# Patient Record
Sex: Male | Born: 1945 | Race: White | Hispanic: No | Smoking: Never smoker
Health system: Southern US, Community
[De-identification: ages and names within clinical notes are randomized; demographics above are authoritative.]

## PROBLEM LIST (undated history)

## (undated) DIAGNOSIS — R51 Headache: Secondary | ICD-10-CM

## (undated) DIAGNOSIS — M8708 Idiopathic aseptic necrosis of bone, other site: Secondary | ICD-10-CM

## (undated) DIAGNOSIS — C649 Malignant neoplasm of unspecified kidney, except renal pelvis: Secondary | ICD-10-CM

## (undated) DIAGNOSIS — C61 Malignant neoplasm of prostate: Secondary | ICD-10-CM

## (undated) DIAGNOSIS — IMO0002 Reserved for concepts with insufficient information to code with codable children: Secondary | ICD-10-CM

## (undated) DIAGNOSIS — E785 Hyperlipidemia, unspecified: Secondary | ICD-10-CM

## (undated) DIAGNOSIS — I639 Cerebral infarction, unspecified: Secondary | ICD-10-CM

## (undated) DIAGNOSIS — N189 Chronic kidney disease, unspecified: Secondary | ICD-10-CM

## (undated) DIAGNOSIS — M1990 Unspecified osteoarthritis, unspecified site: Secondary | ICD-10-CM

## (undated) DIAGNOSIS — C801 Malignant (primary) neoplasm, unspecified: Secondary | ICD-10-CM

## (undated) DIAGNOSIS — R569 Unspecified convulsions: Secondary | ICD-10-CM

## (undated) DIAGNOSIS — I10 Essential (primary) hypertension: Secondary | ICD-10-CM

## (undated) DIAGNOSIS — M879 Osteonecrosis, unspecified: Secondary | ICD-10-CM

## (undated) HISTORY — DX: Reserved for concepts with insufficient information to code with codable children: IMO0002

## (undated) HISTORY — DX: Malignant neoplasm of prostate: C61

## (undated) HISTORY — PX: NEPHRECTOMY: SHX65

## (undated) HISTORY — PX: JOINT REPLACEMENT: SHX530

---
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
## 2000-11-24 HISTORY — PX: OTHER SURGICAL HISTORY: SHX169

## 2000-11-29 ENCOUNTER — Encounter: Payer: Self-pay | Admitting: Urology

## 2000-12-01 ENCOUNTER — Ambulatory Visit (HOSPITAL_COMMUNITY): Admission: RE | Admit: 2000-12-01 | Discharge: 2000-12-01 | Payer: Self-pay | Admitting: Urology

## 2000-12-01 ENCOUNTER — Encounter (INDEPENDENT_AMBULATORY_CARE_PROVIDER_SITE_OTHER): Payer: Self-pay

## 2001-02-08 ENCOUNTER — Encounter: Payer: Self-pay | Admitting: Neurology

## 2001-02-08 ENCOUNTER — Ambulatory Visit (HOSPITAL_COMMUNITY): Admission: RE | Admit: 2001-02-08 | Discharge: 2001-02-08 | Payer: Self-pay | Admitting: Neurology

## 2006-12-13 ENCOUNTER — Encounter: Admission: RE | Admit: 2006-12-13 | Discharge: 2006-12-13 | Payer: Self-pay | Admitting: Internal Medicine

## 2009-10-24 HISTORY — PX: TOTAL KNEE ARTHROPLASTY: SHX125

## 2010-08-30 ENCOUNTER — Inpatient Hospital Stay (HOSPITAL_COMMUNITY): Admission: RE | Admit: 2010-08-30 | Discharge: 2010-09-02 | Payer: Self-pay | Admitting: Orthopedic Surgery

## 2010-11-14 ENCOUNTER — Encounter: Payer: Self-pay | Admitting: Internal Medicine

## 2010-11-14 ENCOUNTER — Encounter: Payer: Self-pay | Admitting: Neurology

## 2011-01-04 LAB — TYPE AND SCREEN: Antibody Screen: NEGATIVE

## 2011-01-04 LAB — BASIC METABOLIC PANEL
BUN: 12 mg/dL (ref 6–23)
CO2: 29 mEq/L (ref 19–32)
Calcium: 8.5 mg/dL (ref 8.4–10.5)
Calcium: 8.6 mg/dL (ref 8.4–10.5)
Chloride: 97 mEq/L (ref 96–112)
GFR calc Af Amer: >60 mL/min (ref >60)
GFR calc Af Amer: >60 mL/min (ref >60)
GFR calc non Af Amer: 58 mL/min — ABNORMAL LOW (ref >60)
GFR calc non Af Amer: >60 mL/min (ref >60)
Glucose, Bld: 104 mg/dL — ABNORMAL HIGH (ref 70–99)
Glucose, Bld: 133 mg/dL — ABNORMAL HIGH (ref 70–99)
Potassium: 3.9 mEq/L (ref 3.5–5.1)
Potassium: 4.2 mEq/L (ref 3.5–5.1)
Potassium: 4.3 mEq/L (ref 3.5–5.1)
Sodium: 131 mEq/L — ABNORMAL LOW (ref 135–145)
Sodium: 133 mEq/L — ABNORMAL LOW (ref 135–145)
Sodium: 135 mEq/L (ref 135–145)

## 2011-01-04 LAB — CBC
HCT: 22.5 % — ABNORMAL LOW (ref 39.0–52.0)
HCT: 25.7 % — ABNORMAL LOW (ref 39.0–52.0)
HCT: 30.4 % — ABNORMAL LOW (ref 39.0–52.0)
Hemoglobin: 14.4 g/dL (ref 13.0–17.0)
Hemoglobin: 7.9 g/dL — ABNORMAL LOW (ref 13.0–17.0)
Hemoglobin: 9 g/dL — ABNORMAL LOW (ref 13.0–17.0)
MCH: 32.2 pg (ref 26.0–34.0)
MCHC: 35.2 g/dL (ref 30.0–36.0)
RBC: 2.47 MIL/uL — ABNORMAL LOW (ref 4.22–5.81)
RBC: 2.78 MIL/uL — ABNORMAL LOW (ref 4.22–5.81)
RBC: 4.52 MIL/uL (ref 4.22–5.81)
RDW: 13.1 % (ref 11.5–15.5)
WBC: 8.8 10*3/uL (ref 4.0–10.5)
WBC: 9 10*3/uL (ref 4.0–10.5)

## 2011-01-04 LAB — COMPREHENSIVE METABOLIC PANEL
ALT: 18 U/L (ref 0–53)
AST: 26 U/L (ref 0–37)
CO2: 29 mEq/L (ref 19–32)
Chloride: 98 mEq/L (ref 96–112)
GFR calc Af Amer: >60 mL/min (ref >60)
GFR calc non Af Amer: >60 mL/min (ref >60)
Potassium: 5.4 mEq/L — ABNORMAL HIGH (ref 3.5–5.1)
Sodium: 135 mEq/L (ref 135–145)
Total Bilirubin: 1 mg/dL (ref 0.3–1.2)

## 2011-01-04 LAB — ABO/RH: ABO/RH(D): O POS

## 2011-01-04 LAB — URINALYSIS, ROUTINE W REFLEX MICROSCOPIC
Bilirubin Urine: NEGATIVE
Glucose, UA: NEGATIVE mg/dL
Ketones, ur: NEGATIVE mg/dL
pH: 7 (ref 5.0–8.0)

## 2011-01-04 LAB — SURGICAL PCR SCREEN
MRSA, PCR: NEGATIVE
Staphylococcus aureus: POSITIVE — AB

## 2011-03-11 NOTE — Op Note (Signed)
[facility]  Patient:    WANE, MOLLETT                     MRN: [id number] Proc. Date: 12/01/00 Adm. Date:  [id number] Attending:  Londell Moh                           Operative Report  PREOPERATIVE DIAGNOSIS:  Symptomatic left hydrocele.  POSTOPERATIVE DIAGNOSIS:  Symptomatic left hydrocele.  OPERATION:  Left hydrocelectomy.  SURGEON:  Jamison Neighbor, M.D.  ANESTHESIA:  General with postoperative cord block.  COMPLICATIONS:  None.  DRAIN:  1/4-inch Penrose drain.  BRIEF HISTORY:  This 65 year old male has an increasingly large left hydrocele.  The patient has become symptomatic and has requested a left hydrocelectomy.  He understands the risks and benefits of the procedure and gave full and informed consent.  DESCRIPTION OF PROCEDURE:  After the successful induction of general anesthesia, the patient was placed in the supine position, prepped with Betadine, and draped in the usual sterile fashion.  Midline incision was made along the median raphe and then was carried down the multiple vesting layers of the left hemiscrotum.  The hydrocele sac was elevated out of the hemiscrotum, and the layers surrounding the sac were all peeled away.  The sac was then opened and drained.  It was quite large, and the fluid was clear with no signs of infection or hematoma.  The testicle was inspected and was unremarkable.  The epididymis was also intact with no spermatocele or any other abnormalities detected.  The cord was also normal.  The hydrocele sac was quite thick, and a portion of this was removed and sent for specimen.  The hydrocele sac was everted and was then closed on the back side of the testicle with a running locked suture of 3-0 Vicryl.  Hemostasis was obtained with electrocautery.  Testicle was turned back into the left hemiscrotum, and a small puncture wound was made to bring a Penrose drain out from the most appended portion of  the scrotum.  The darkest layer and other layers of the scrotum were then closed in multiple layers with running sutures of 3-0 Vicryl.  The skin was closed with 3-0 chromic.  ______ was applied. The patient was given scrotal support.  The patient tolerated the procedure well and was taken to the recovery room in good condition.  He is to have Lorcet Plus and doxycycline and will return in one week for removal of the drain. DD:  12/01/00 TD:  12/02/00 Job: [id number]

## 2011-09-30 ENCOUNTER — Encounter (HOSPITAL_COMMUNITY): Payer: Self-pay | Admitting: General Practice

## 2011-09-30 ENCOUNTER — Inpatient Hospital Stay (HOSPITAL_COMMUNITY)
Admission: AD | Admit: 2011-09-30 | Discharge: 2011-10-05 | DRG: 066 | Disposition: A | Discharge status: Rehabilitation Facility | Payer: Medicare Other | Source: Ambulatory Visit | Attending: Neurology | Admitting: Neurology

## 2011-09-30 DIAGNOSIS — Z85819 Personal history of malignant neoplasm of unspecified site of lip, oral cavity, and pharynx: Secondary | ICD-10-CM

## 2011-09-30 DIAGNOSIS — I6340 Cerebral infarction due to embolism of unspecified cerebral artery: Principal | ICD-10-CM | POA: Diagnosis present

## 2011-09-30 DIAGNOSIS — I639 Cerebral infarction, unspecified: Secondary | ICD-10-CM | POA: Diagnosis present

## 2011-09-30 DIAGNOSIS — G902 Horner's syndrome: Secondary | ICD-10-CM | POA: Diagnosis present

## 2011-09-30 DIAGNOSIS — G40909 Epilepsy, unspecified, not intractable, without status epilepticus: Secondary | ICD-10-CM | POA: Diagnosis present

## 2011-09-30 DIAGNOSIS — G909 Disorder of the autonomic nervous system, unspecified: Secondary | ICD-10-CM | POA: Diagnosis present

## 2011-09-30 DIAGNOSIS — R112 Nausea with vomiting, unspecified: Secondary | ICD-10-CM | POA: Diagnosis present

## 2011-09-30 DIAGNOSIS — E785 Hyperlipidemia, unspecified: Secondary | ICD-10-CM | POA: Diagnosis present

## 2011-09-30 DIAGNOSIS — H02409 Unspecified ptosis of unspecified eyelid: Secondary | ICD-10-CM | POA: Diagnosis present

## 2011-09-30 DIAGNOSIS — E782 Mixed hyperlipidemia: Secondary | ICD-10-CM | POA: Diagnosis present

## 2011-09-30 DIAGNOSIS — R51 Headache: Secondary | ICD-10-CM | POA: Diagnosis present

## 2011-09-30 DIAGNOSIS — F1020 Alcohol dependence, uncomplicated: Secondary | ICD-10-CM | POA: Diagnosis present

## 2011-09-30 DIAGNOSIS — Z7982 Long term (current) use of aspirin: Secondary | ICD-10-CM

## 2011-09-30 DIAGNOSIS — R519 Headache, unspecified: Secondary | ICD-10-CM | POA: Diagnosis present

## 2011-09-30 DIAGNOSIS — I10 Essential (primary) hypertension: Secondary | ICD-10-CM | POA: Diagnosis present

## 2011-09-30 HISTORY — DX: Cerebral infarction, unspecified: I63.9

## 2011-09-30 HISTORY — DX: Chronic kidney disease, unspecified: N18.9

## 2011-09-30 HISTORY — DX: Malignant (primary) neoplasm, unspecified: C80.1

## 2011-09-30 HISTORY — DX: Unspecified convulsions: R56.9

## 2011-09-30 HISTORY — DX: Malignant neoplasm of unspecified kidney, except renal pelvis: C64.9

## 2011-09-30 HISTORY — DX: Essential (primary) hypertension: I10

## 2011-09-30 HISTORY — DX: Unspecified osteoarthritis, unspecified site: M19.90

## 2011-09-30 LAB — DIFFERENTIAL
Basophils Absolute: 0 10*3/uL (ref 0.0–0.1)
Eosinophils Relative: 4 % (ref 0–5)
Lymphocytes Relative: 17 % (ref 12–46)
Monocytes Absolute: 0.8 10*3/uL (ref 0.1–1.0)
Monocytes Relative: 13 % — ABNORMAL HIGH (ref 3–12)

## 2011-09-30 LAB — CBC
HCT: 41.3 % (ref 39.0–52.0)
MCHC: 36.3 g/dL — ABNORMAL HIGH (ref 30.0–36.0)
MCV: 86 fL (ref 78.0–100.0)
RDW: 12.6 % (ref 11.5–15.5)
WBC: 6.1 10*3/uL (ref 4.0–10.5)

## 2011-09-30 LAB — COMPREHENSIVE METABOLIC PANEL
AST: 18 U/L (ref 0–37)
BUN: 7 mg/dL (ref 6–23)
CO2: 29 mEq/L (ref 19–32)
Calcium: 9.7 mg/dL (ref 8.4–10.5)
Creatinine, Ser: 0.94 mg/dL (ref 0.50–1.35)
GFR calc Af Amer: >90 mL/min (ref >90)
GFR calc non Af Amer: 87 mL/min — ABNORMAL LOW (ref >90)

## 2011-09-30 LAB — TSH: TSH: 0.606 u[IU]/mL (ref 0.350–4.500)

## 2011-09-30 MED ORDER — SENNOSIDES-DOCUSATE SODIUM 8.6-50 MG PO TABS
1.0000 | ORAL_TABLET | Freq: Every evening | ORAL | Status: DC | PRN
  Administered 2011-10-02 – 2011-10-03 (×2): 1 via ORAL
  Filled 2011-09-30 (×2): qty 1

## 2011-09-30 MED ORDER — LAMOTRIGINE 100 MG PO TABS
100.0000 mg | ORAL_TABLET | Freq: Two times a day (BID) | ORAL | Status: DC
  Administered 2011-09-30 – 2011-10-04 (×8): 100 mg via ORAL
  Filled 2011-09-30 (×9): qty 1

## 2011-09-30 MED ORDER — ASPIRIN 325 MG PO TABS
325.0000 mg | ORAL_TABLET | Freq: Every day | ORAL | Status: DC
  Administered 2011-09-30 – 2011-10-05 (×6): 325 mg via ORAL
  Filled 2011-09-30 (×6): qty 1

## 2011-09-30 MED ORDER — ONDANSETRON HCL 4 MG PO TABS: 4.0000 mg | ORAL_TABLET | Freq: Four times a day (QID) | ORAL | Status: DC | PRN

## 2011-09-30 MED ORDER — MECLIZINE HCL 25 MG PO TABS
50.0000 mg | ORAL_TABLET | Freq: Three times a day (TID) | ORAL | Status: DC | PRN
  Administered 2011-09-30: 50 mg via ORAL
  Filled 2011-09-30: qty 2

## 2011-09-30 NOTE — Progress Notes (Signed)
Speech Language/Pathology Clinical/Bedside Swallow Evaluation Patient Details  Name: Connor [name] MRN: [id number] DOB: 09-07-1946 Today's Date: 09/30/2011  Past Medical History: No past medical history on file. Past Surgical History: No past surgical history on file.  Assessment/Recommendations/Treatment Plan    SLP Assessment Clinical Impression Statement: Patient does exhibit s/s of oro-pharyngeal dysphagia, however, he reports this is his baseline since having throat cancer and s/p radiation to the neck.However, while typing this report, the patient was noted to be coughing with the remainder of the graham cracker and water.  Patient would benefit from an objective study to ensure proper diet recommendations. Risk for Aspiration: Moderate Other Related Risk Factors:  (h/o throat cancer, s/p radiation to the neck.)  Recommendations Recommended Consults: MBS Solid Consistency: Dysphagia 3 (Mechanical soft) Liquid Consistency: Nectar Liquid Administration via: Cup Medication Administration: Whole meds with puree Supervision: Staff feed patient Compensations: Slow rate;Small sips/bites Postural Changes and/or Swallow Maneuvers: Seated upright 90 degrees;Out of bed for meals Oral Care Recommendations: Oral care QID Other Recommendations: Prohibited food (jello, ice cream, thin soups)  Prognosis Prognosis for Safe Diet Advancement: Good  Individuals Consulted Consulted and Agree with Results and Recommendations: Patient  Swallow Study Goals     Swallow Study Prior Functional Status     General  Other Pertinent Information: Limited records available.  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  PMH:  squamous cell carcinoma of the neck, treated with radiation and chemo (2-3 years ago  per patinet0.  This resulted in dysphagia and reduced salive.  Patient required a Panda feeding tube "for awhile," but has been tolerating a regular diet with thin liquids PTA. Type of Study: Bedside swallow evaluation Diet Prior to this Study: Regular;Thin liquids Temperature Spikes Noted: No Respiratory Status: Room air History of Intubation: No Behavior/Cognition: Alert;Cooperative;Pleasant mood Oral Cavity - Dentition: Adequate natural dentition Vision: Impaired for self-feeding Patient Positioning: Upright in bed Baseline Vocal Quality: Normal Volitional Cough: Strong Volitional Swallow: Unable to elicit (unable to dry swallow, secondary to reduced saliva productio)  Oral Motor/Sensory Function     Consistency Results     Thin Liquid Thin Liquid: Within functional limits Presentation: Cup;Straw;Self Fed  Nectar Thick Liquid Nectar Thick Liquid: Not tested  Honey Thick Liquid Honey Thick Liquid: Not tested  Puree Puree: Impaired Pharyngeal Phase Impairments: Decreased hyoid-laryngeal movement;Other (comments) Other Comments: Multiple swallows (2) with one bite, then asked for water to "wash it down."  Pt. reports this is his baseline function.  Solid Solid: Impaired Pharyngeal Phase Impairments: Multiple swallows Other Comments: Required sips of water to assist with bolus formation of the cracker.   Connor [name] T 09/30/2011,4:47 PM

## 2011-09-30 NOTE — H&P (Signed)
Reason for admission: Acute stroke History was obtained from patient and Dr. Imagene Gurney office note  CC: Left-sided headache, nausea and vomiting, vertigo, right-sided numbness and gait imbalance  HPI: Connor [name] is an 65 y.o. male bid past medical history of seizure disorder and chronic alcoholism who is a patient of Dr. love since 1994. Patient also has history of throat cancer and underwent radiation therapy and chemotherapy at [facility]. His most recent CT of the neck with contrast on 07/12/2011 showed no evidence of recurrent disease. On wednesday 09/28/2011 he was eating breakfast with his friends in a restaurant and noted the onset of left-sided headache with numbness in the left for head extending down the left side of his face and then involving his entire body with a tingling sensation or numbness, worse on the left side of his body than the right. He became weak in both arms and both legs and slumped in his chair. There was no chest pain or out the patient all her seizure. 911 was called and he was seen at hospital emergency room. CT scan of the brain without contrast was unremarkable and MRI study of the brain with and without contrast which was reviewed by Dr. Lennox Laity was negative for acute stroke on December 5. He was sent home after the evaluation but noted that he was falling on his left side. He also noted double vision and oscillopsia. He did not have hiccups. He also noted difficulty voiding.  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 Patient also has history of seizures.  Past surgical history: Left kidney removed in 1998 due to cancer, left knee joint replacement in 2011. Hydrocele removed in 2002. Father reportedly moved from right orbit and 2007.   Family history: His mother died at age 23 with kidney failure. His father is alive at age 61. His  mother was positive for diabetes.  Social History: He lives at home alone and is divorced at this time. Patient is retired. He has felt good education. He has 2 children. denies ever using tobacco products or illicit drug. Patient drinks about 0-3 beers per day and consumes 2 cups of coffee every day.   Home medications:  Ondansetron 4 mg TBDP Meclizine 25 mg tab by mouth every 8 hours when necessary for dizziness Aspirin 81 mg daily Amlodipine-valsartan Lamotrigine 100 mg tabs one twice daily   Allergies  Allergen Reactions  . Morphine And Related Nausea And Vomiting    Medications: I have reviewed the patient's current medications.  ROS: Over the complete 14 system review the patient complains of only the following symptoms in all other review of systems were negative  Blood pressure 174/96, pulse 72, temperature 97.7 F (36.5 C), temperature source Oral, resp. rate 18, height 5\' 11"  (1.803 m), weight 190 lb (86.183 kg).  Neurologic Examination: Mental status: Alert and oriented x3. Follows 1, 2, 3 step commands. MMSE 29/30. Cranial nerves: Visual fields full. Discs flat. Left Horner syndrome. Jerk nystagmus with eyes to the left. Red lens with left eye hyperphoria. Face Symmetric. Decreased sensation on left corneal and left face and deviation of the uvula to the right. The region of the left. Sternocliedomastoid and trapezius testing normal. Decreased hearing bilaterally with right hearing aid. Motor: 5 out of 5 Sensory: Decreased sensation to pinprick over the right half of the body. Coordination: Slower rapid movements left hand than  right hand. Fair heel-to-shin bilaterally. Outstretched hand and arm tremor. Gait: Falls to the left on standing. Reflexes: Increased deep tendon reflexes on the right as compared to the left.      Assessment/Plan:  Left Wallenberg stroke: CTA head and neck, admitted to telemetry, lipid profile, HbA1c, bedside swallow screen, consult  PT/OT. hold blood pressure medications for first 48 hours.    Lars Mage MD [facility] Internal Medicine Resident Pager [phone]  09/30/2011, 5:09 PM

## 2011-10-01 ENCOUNTER — Encounter (HOSPITAL_COMMUNITY): Payer: Self-pay | Admitting: Radiology

## 2011-10-01 ENCOUNTER — Inpatient Hospital Stay (HOSPITAL_COMMUNITY): Payer: Medicare Other

## 2011-10-01 LAB — HEMOGLOBIN A1C
Hgb A1c MFr Bld: 5.5 % (ref <5.7)
Mean Plasma Glucose: 111 mg/dL (ref <117)

## 2011-10-01 LAB — LIPID PANEL
HDL: 54 mg/dL (ref >39)
Triglycerides: 130 mg/dL (ref <150)

## 2011-10-01 MED ORDER — ACETAMINOPHEN 325 MG PO TABS
650.0000 mg | ORAL_TABLET | Freq: Four times a day (QID) | ORAL | Status: DC | PRN
  Administered 2011-10-01 – 2011-10-03 (×3): 650 mg via ORAL
  Filled 2011-10-01 (×3): qty 2

## 2011-10-01 MED ORDER — IOHEXOL 350 MG/ML SOLN
75.0000 mL | Freq: Once | INTRAVENOUS | Status: AC | PRN
  Administered 2011-10-01: 75 mL via INTRAVENOUS

## 2011-10-01 NOTE — Progress Notes (Signed)
Physical Therapy Evaluation Patient Details Name: TODDRICK SANNA MRN: [id number] DOB: 1946-02-08 Today's Date: 10/01/2011  Problem List:  Patient Active Problem List  Diagnoses  . Stroke, Wallenberg's syndrome  . HTN (hypertension)  . Seizure disorder    Past Medical History:  Past Medical History  Diagnosis Date  . 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 Head and neck cancer ~ 2009    S/P radiation & chem  . Stroke   . Complication of anesthesia   . Spinal headache     "had to have 2 blood " patches S/P nephrectomy  . Hypertension   . Seizures     "the bad kind;bite tongue;see Dr.Love; last 07/2011 & 08/2011"  . Chronic kidney disease   . Cancer     h/o neck  . Kidney carcinoma     h/o  . Arthritis    Past Surgical History:  Past Surgical History  Procedure Date  . Nephrectomy 1990's    left  . Joint replacement   . Total knee arthroplasty 2011    left  . Hydrocelectomy 11/2000    left    PT Assessment/Plan/Recommendation PT Assessment Clinical Impression Statement: Pt presents with spontaneous Rt. beating nystagmus that appears to follow Alexander's Law to the 3rd degree. Pt also appears to have Lt. ptosis however unclear if this is baseline. Currently pt has significant difficulty with gaze stabilization and has been given exercises to try to address this. Pt has significant difficulty with Lt. Lateral lean however responded well to cues for alignement of upright objects in the room during sitting and standing activities. Pt requires RW for transfers at this time. Pt would be a great candidate for CIR.  PT Recommendation/Assessment: Patient will need skilled PT in the acute care venue PT Problem List: Decreased strength;Decreased activity tolerance;Decreased balance;Decreased mobility;Decreased knowledge of use of DME (Vestibular deficits) PT Therapy Diagnosis : Difficulty walking;Generalized weakness PT Plan PT Frequency: Min  4X/week PT Treatment/Interventions: DME instruction;Gait training;Stair training;Functional mobility training;Therapeutic activities;Therapeutic exercise;Balance training;Neuromuscular re-education;Patient/family education PT Recommendation Recommendations for Other Services: Rehab consult Follow Up Recommendations: Inpatient Rehab Equipment Recommended: Defer to next venue PT Goals  Acute Rehab PT Goals PT Goal Formulation: With patient Time For Goal Achievement: 2 weeks Pt will go Supine/Side to Sit: with modified independence PT Goal: Supine/Side to Sit - Progress: Progressing toward goal Pt will Sit at [facility] of Bed: with modified independence;with no upper extremity support;3-5 min (midline with no cues) PT Goal: Sit at Edge Of Bed - Progress: Progressing toward goal Pt will go Sit to Supine/Side: with modified independence PT Goal: Sit to Supine/Side - Progress: Progressing toward goal Pt will go Sit to Stand: with supervision PT Goal: Sit to Stand - Progress: Progressing toward goal Pt will go Stand to Sit: with supervision PT Goal: Stand to Sit - Progress: Progressing toward goal Pt will Transfer Bed to Chair/Chair to Bed: with min assist PT Transfer Goal: Bed to Chair/Chair to Bed - Progress: Progressing toward goal Pt will Ambulate: 1 - 15 feet;with min assist;with rolling walker PT Goal: Ambulate - Progress: Progressing toward goal  PT Evaluation Precautions/Restrictions  Precautions Precautions: Fall Precaution Comments: Lt. 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 Cognition Cognition Arousal/Alertness: Awake/alert Overall Cognitive Status: Appears within functional limits for tasks assessed Orientation Level: Oriented X4 Sensation/Coordination Sensation Additional Comments: Pt reporting difficulty with Hot/Cold determination in UEs and LEs Coordination Gross Motor Movements are Fluid and Coordinated: Yes Fine Motor Movements are Fluid and Coordinated: Yes Heel Shin Test: No Clear deficits Extremity Assessment RLE Assessment RLE Assessment: Within Functional Limits LLE Assessment LLE Assessment:  (WFL with Testing, pt reports functional weakness) Mobility (including Balance) Bed Mobility Bed Mobility: Yes Rolling Right: 5: Supervision Rolling Right Details (indicate cue type and reason): verbal cues for sequencing, utilizing visual targets with mobility  Right Sidelying to Sit: 4: Min assist Right Sidelying to Sit Details (indicate cue type and reason): verbal cues for sequencing, utilizing visual targets with mobility, assist to stabilize in sitting. Verbal/tactile cues to align with PT to reach midline  Sitting - Scoot to Edge of Bed: 4: Min assist Sitting - Scoot to [place] of Bed Details (indicate cue type and reason): PT requiring hand hold assistance and trunk support to scoot to EOB Transfers Transfers: Yes Sit to Stand: 4: Min assist;From bed Sit to Stand Details (indicate cue type and reason): Assistance, verbal/tactile cues to facilitate improved midline posture (decrease Lt. Lean) with standing. Verbal cues to use upright targets to align body with. Stand to Sit: 3: Mod assist;To chair/3-in-1 Stand to Sit Details: Assistance secondary to generalized instability, verbal cues for UE placement.  Stand Pivot Transfers: 1: +2 Total assist (pt= 70%) Stand Pivot Transfer Details (indicate cue type and reason):  Verbal cues for sequencing, using visual targets to align self with with transfer. ?Ataxic movements vs. Effects of gaze instability.  Ambulation/Gait Ambulation/Gait: No (Pt unable at this time)  Posture/Postural Control Posture/Postural Control: Postural limitations Postural Limitations: Significant Lt. Lateral lean Balance Balance Assessed: Yes Static Sitting Balance Static Sitting - Balance Support: Bilateral upper extremity supported;No upper extremity supported;Feet supported Static Sitting - Level of Assistance: 4: Min assist Static Sitting - Comment/# of Minutes: 8-10 min. Pt practicing aligning with PT at midline. Without cuing pt has significant lean to Lt. by end of 8-10 minutes pt able to hold midline without verbal or tactile cues. Verbal cues for increasing weightbearing onto Rt. hip.  Dynamic Sitting Balance Dynamic Sitting - Balance Activities: Lateral lean/weight shifting Dynamic Sitting - Comments: Practiced Leaning to Rt. past midline.  Static Standing Balance Static Standing - Balance Support: Bilateral upper extremity supported;Left upper extremity supported;No upper extremity supported;Right upper extremity supported Static Standing - Level of Assistance: 3: Mod assist (up to mod assist with no UE support) Static Standing - Comment/# of Minutes: ~3 min. Practiced using upright target to align midline with.  Exercise  General Exercises - Lower Extremity Ankle Circles/Pumps: AROM;Both;Seated Other Exercises Other Exercises: Gaze stabilization exercises with letter "A" on wall.  End of Session PT - End of Session Equipment Utilized During Treatment: Gait belt Activity Tolerance: Patient limited by fatigue (limited by dizziness) Patient left: in chair;with call bell in reach Nurse Communication: Mobility status for transfers General Behavior During Session: [facility] for tasks performed Cognition: [facility] for tasks performed  Sherie Don 10/01/2011, 11:36 AM Sherie Don) Carleene Mains PT, DPT Acute Rehabilitation [phone]

## 2011-10-01 NOTE — Procedures (Signed)
Modified Barium Swallow Procedure Note Patient Details  Name: Connor [name] MRN: [id number] Date of Birth: Jan 05, 1946  Today's Date: 10/01/2011 Time:  -     Past Medical History:  Past Medical History  Diagnosis Date  . 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 Head and neck cancer ~ 2009    S/P radiation & chem  . Stroke   . Complication of anesthesia   . Spinal headache     "had to have 2 blood " patches S/P nephrectomy  . Hypertension   . Seizures     "the bad kind;bite tongue;see Dr.Love; last 07/2011 & 08/2011"  . Chronic kidney disease   . Cancer     h/o neck  . Kidney carcinoma     h/o  . Arthritis    Past Surgical History:  Past Surgical History  Procedure Date  . Nephrectomy 1990's    left  . Joint replacement   . Total knee arthroplasty 2011    left  . 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 Pharyngeal dysphagia marked by delay in initiation with all swallows initiated at pyriforms.  Slow bolus transit with all consistencies due to narrowing due to what appeared to be edema or excess tissue . Dr. Charlett Nose present during liquid trials and confirmed no presence of diverticulim  Recommendations Recommended Consults: Consider GI evaluation;Consider esophageal assessment Solid Consistency: Dysphagia 3 (Mechanical soft) Liquid Consistency: Thin Liquid Administration via: Straw;Cup Medication Administration: Crushed with puree Supervision: Intermittent supervision to cue for compensatory strategies Compensations: Slow rate;Small sips/bites;Effortful swallow;Multiple dry swallows after each bite/sip;Follow solids with liquid;Clear throat intermittently Postural Changes and/or Swallow  Maneuvers: Out of bed for meals;Seated upright 90 degrees;Upright 30-60 min after meal Oral Care Recommendations: Oral care before and after PO Other Recommendations: Clarify dietary restrictions Prognosis Prognosis for Safe Diet Advancement: Good Individuals Consulted Consulted and Agree with Results and Recommendations: Patient;RN  SLP Assessment/Plan  Pharyngeal dysphagia secondary to what appeared to be incomplete relaxation of the CP muscle. Severe pooling in pyriforms before swallow with moderate to significant amount of stasis s/p swallow of all consistencies increasing risk for aspiration after the swallow.  Flash penetration occurred x1 with puree consistency. Strategies effective in decreasing  stasis in pyriforms are effortful swallow and swallow x 2 to 3 x.  Patient may benefit from pharyngeal manometry to assess CP function. Recommend ST in acute setting for diet tolerance and to provide education to patient on strategies and aspiration precautions.  SLP Goals   Patient will consume current diet of Dysphagia 3 with thin liquids with no observed clinical s/s of aspiration with minimal verbal cues to utilize recommended strategies.  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 Dysphagia following radiation requiring temporary means of nutrition. Initial BSE completed on 09/30/11 indicated oropharyngeal dysphagia with + s/s of aspiration with thin liquids and solids. MBS recommended to assess risk for aspiration and to recommend safest diet with necessary  strategies. Type of Study: Initial MBS Diet Prior to this Study: Dysphagia 3 (soft);Nectar-thick liquids Temperature Spikes Noted: No Respiratory Status: Room air History of Intubation: No Behavior/Cognition: Alert;Cooperative Oral Cavity - Dentition: Adequate natural dentition Oral Motor / Sensory Function: Within  functional  limits Vision: Impaired for self-feeding Patient Positioning: Upright in chair Baseline Vocal Quality: Normal Volitional Cough: Strong Volitional Swallow: Able to elicit Ice chips: Not tested     Oral Phase Oral Preparation/Oral Phase Oral Phase: Impaired Oral - Nectar Oral - Nectar Cup: Piecemeal swallowing Oral - Thin Oral - Thin Cup: Piecemeal swallowing Oral - Thin Straw: Piecemeal swallowing Oral - Solids Oral - Puree: Within functional limits Oral - Regular: Weak lingual manipulation;Lingual/palatal residue;Delayed oral transit;Piecemeal swallowing Oral - Pill: Within functional limits Oral Phase - Comment Oral Phase - Comment: Oral phase affected secondary to xerostomia decreased bolus cohesion and anterior to posterior propulsion Pharyngeal Phase  Pharyngeal Phase Pharyngeal Phase: Impaired Pharyngeal - Nectar Pharyngeal - Nectar Cup: Delayed swallow initiation;Premature spillage to pyriform;Pharyngeal residue - pyriform;Pharyngeal residue - cp segment;Compensatory strategies attempted (Comment) (swallow x2 cleared majority of stasis in pyriforms) Pharyngeal - Thin Pharyngeal - Thin Cup: Pharyngeal residue - pyriform;Delayed swallow initiation;Premature spillage to pyriform;Pharyngeal residue - cp segment;Compensatory strategies attempted (Comment) (swallow x2  cleared majority of stasis in pyriforms) Pharyngeal - Thin Straw: Delayed swallow initiation;Premature spillage to pyriform;Pharyngeal residue - pyriform;Pharyngeal residue - cp segment Pharyngeal - Solids Pharyngeal - Puree: Pharyngeal residue - cp segment;Pharyngeal residue - pyriform;Compensatory strategies attempted (Comment);Penetration/Aspiration during swallow (effortful swallow x2 cleared majority of stasis in pyriforms) Penetration/Aspiration details (puree): Material enters airway, remains ABOVE vocal cords then ejected out Pharyngeal - Regular: Pharyngeal residue - pyriform;Premature spillage to  pyriform;Delayed swallow initiation;Pharyngeal residue - cp segment Pharyngeal - Pill: Pharyngeal residue - pyriform;Compensatory strategies attempted (Comment) Pharyngeal Phase - Comment Pharyngeal Comment: Pharyngeal dysphagia secondary to incomplete relaxation of CP muscle . All Po trials resulted in significant pooling in pyriforms with stasis s/p swallow increasing risk for aspiration s/p swallow of all consistencies. Cervical Esophageal Phase  Cervical Esophageal Phase Cervical Esophageal Phase: Impaired Cervical Esophageal Phase - Solids Regular: Reduced cricopharyngeal relaxation;Esophageal backflow into cervical esophagus Pill: Reduced cricopharyngeal relaxation;Esophageal backflow into cervical esophagus   Moreen Fowler, M.S., CCC-SLP [phone]  [facility] 10/01/2011, 5:54 PM

## 2011-10-01 NOTE — Progress Notes (Signed)
Subjective: The patient is a 65 year old gentleman with a history of onset of dizziness, gait instability, double vision, and nausea that began on 09/28/2011. The patient has noted a tendency to veer to the right while walking. MRI of the brain did not show evidence of an acute stroke, but the patient has a suspected Wallenberg-type stroke. The patient was admitted for further evaluation. CT angiogram of the head and neck was done, but the formal report is not available to me. By my review, there is no evidence of a large vessel occlusion. Patient was on aspirin prior to the onset of the deficit. At this time, the patient is not effectively ambulatory. Physical therapy to start working with him, that he has not been able to ambulate. Speech therapy is seen, and the patient now is on an oral diet, with nectar thick liquids. No new issues have been noted since admission. Patient denies any actual vomiting.  Objective: Vital signs in last 24 hours: Temp:  [97.7 F (36.5 C)-98.8 F (37.1 C)] 97.7 F (36.5 C) (12/08 1010) Pulse Rate:  [72-95] 84  (12/08 1010) Resp:  [17-18] 18  (12/08 1010) BP: (133-174)/(80-96) 133/93 mmHg (12/08 1010) SpO2:  [95 %-98 %] 98 % (12/08 1010) Weight:  [86.183 kg (190 lb)] 190 lb (86.183 kg) (12/07 1623) Weight change:  Last BM Date: 09/27/11  Intake/Output from previous day: 12/07 0701 - 12/08 0700 In: 360 [P.O.:360] Out: 1250 [Urine:1250] Intake/Output this shift: Total I/O In: -  Out: 450 [Urine:450]  @ROS @ The patient reports a slight headache on the left posterior and temporal region. The patient does note some nausea, no vomiting. The patient reports double vision and ocillopsia. The patient does report some numbness on the left face, not on the body. Patient denies any weakness. The patient denies shortness of breath, and denies chest pain. The patient denies abdominal pain.   Physical Examination:  Mental Status Exam: The patient is alert and  cooperative at the time of the examination. The patient is oriented x3.  Motor Exam: The patient moves all 4 extremities, and has good strength bilaterally. No drift is seen.  Cerebellar Exam: The patient has good finger-nose-finger and heel-to-shin bilaterally. Gait was not tested.  Deep Tendon reflexes: Deep tendon reflexes are symmetric throughout. Toes are downgoing bilaterally.  Cranial Nerve Exam: Facial symmetry is present. Extraocular movements are full. Significant status is seen on primary gaze, with the fast phase to the right. Visual fields are full. Speech is well enunciated, no aphasia or dysarthria is noted.  Breast her examination is clear bilaterally  Cardiovascular semination reveals a regular rate and rhythm, no obvious murmurs or rubs are noted.  Abdomen reveals positive bowel sounds, no tenderness is noted.  Lab Results:  [facility] 09/30/11 1752  WBC 6.1  HGB 15.0  HCT 41.3  PLT 249   BMET  Basename 09/30/11 1752  NA 132*  K 4.3  CL 95*  CO2 29  GLUCOSE 116*  BUN 7  CREATININE 0.94  CALCIUM 9.7    Studies/Results: Dg Chest 2 View  10/01/2011  *RADIOLOGY REPORT*  Clinical Data: Stroke  CHEST - 2 VIEW  Comparison: 08/25/2010  Findings: The heart size and pulmonary vascularity are normal. The lungs appear clear and expanded without focal air space disease or consolidation. No blunting of the costophrenic angles.  No significant change since previous study.  IMPRESSION: No evidence of active pulmonary disease.  Original Report Authenticated By: Marlon Pel, M.D.    Medications:  Scheduled:   . aspirin  325 mg Oral Daily  . lamoTRIgine  100 mg Oral BID   Continuous:  [id number], meclizine, ondansetron, senna-docusate  Assessment/Plan:  1. 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 The patient likely will need inpatient rehabilitation. Patient was on aspirin at the time of the stroke.  The patient is now on a dysphasia diet with nectar thick liquids. Patient is not having vomiting, and is able to eat. The CT angiogram has been done, but the formal report is pending. I will obtain a rehabilitation consult. Physical, occupational, and speech therapy are working with the patient.    LOS: 1 day   WILLIS,CHARLES KEITH 10/01/2011, 10:45 AM

## 2011-10-02 NOTE — Progress Notes (Signed)
Subjective: Patient is alert and cooperative. The patient reports some left-sided headache, and still notes ocillopsia. The visual complaints have resolved and some underlying nausea and discomfort. Patient is now eating and drinking fairly well. Overall, the patient feels slightly better than he did yesterday.  Objective: Vital signs in last 24 hours: Temp:  [98 F (36.7 C)-98.6 F (37 C)] 98 F (36.7 C) (12/09 0958) Pulse Rate:  [70-90] 90  (12/09 0958) Resp:  [16-18] 16  (12/09 0958) BP: (131-169)/(81-90) 131/81 mmHg (12/09 0958) SpO2:  [98 %-100 %] 98 % (12/09 0958) Weight change:  Last BM Date: 09/27/11  Intake/Output from previous day: 12/08 0701 - 12/09 0700 In: -  Out: 1225 [Urine:1225] Intake/Output this shift:    @ROS @  The patient reports a left-sided headache. The patient reports dizziness.  The patient indicates that his eyes tend to want to go to the left. The patient reports oscillopsia.  The patient denies short of breath, chest pain.  The patient denies nausea or vomiting, or abdominal pain.  The patient has not had any new numbness or weakness of the face, arms, or legs.  The patient reports some double vision.     Physical Examination:  Mental Status Exam: The patient is alert and cooperative at the time of the examination. The patient is oriented x3.  Motor Exam: The patient moves all 4 extremities, and has good strength bilaterally.  Cerebellar Exam: The patient has good finger-nose-finger and heel-to-shin bilaterally. Gait was not tested.  Deep tendon reflexes: Deep tendon reflexes are symmetric throughout. Toes are downgoing bilaterally.  Cranial Nerve Exam: Facial symmetry is present. Extraocular movements are full. Nystagmus is seen with lateral gaze bilaterally, fast phase to the right.  Visual fields are full. Speech is well enunciated, no aphasia or dysarthria is noted.  Respiratory examination is clear.  Cardiovascular examination  reveals a regular rate and rhythm, no obvious murmurs or rubs are noted.  Extremities are without significant edema.  Abdominal examination reveals positive bowel sounds, no tenderness noted.   Lab Results:  [facility] 09/30/11 1752  WBC 6.1  HGB 15.0  HCT 41.3  PLT 249   BMET  Basename 09/30/11 1752  NA 132*  K 4.3  CL 95*  CO2 29  GLUCOSE 116*  BUN 7  CREATININE 0.94  CALCIUM 9.7    Studies/Results: Ct Angio Head W/cm &/or Wo Cm  10/01/2011  *RADIOLOGY REPORT*  Clinical Data:  Left sided headache and blurred vision.  Off balance.  Evaluate posterior circulation for stroke.  CT ANGIOGRAPHY HEAD  Technique:  Multidetector CT imaging of the head was performed using the standard protocol during bolus administration of intravenous contrast.  Multiplanar CT image reconstructions including MIPs were obtained to evaluate the vascular anatomy.  Contrast: 75mL OMNIPAQUE IOHEXOL 350 MG/ML IV SOLN  Comparison:  None available.  Findings:  The noncontrast images of the head demonstrate no acute infarct, hemorrhage, mass lesion.  The source images reveal no acute infarct. The ventricles are of normal size.  No significant extra-axial fluid collection is present.  The more delayed postcontrast images demonstrate no areas of pathologic enhancement.  Minimal mucosal thickening is present within the maxillary sinuses bilaterally.  The remaining paranasal sinuses and mastoid air cells are clear.  The internal carotid arteries demonstrate atherosclerotic calcifications within the cavernous segments bilaterally.  There is no significant stenosis relative to the distal vessels.  The A1 and M1 segments are normal.  The anterior communicating artery is patent.  An  azygos A2 segment is noted.  The MCA bifurcations are normal bilaterally.  Mild segmental irregularity is noted within distal MCA branch vessels.  The ACA branch vessels are unremarkable.  The left vertebral artery is the dominant vessel.  The right  vertebral artery terminates at the PICA.  No definite left PICA is visualized.  A prominent left AICA is noted.  The basilar artery is normal.  The right posterior cerebral artery originates from basilar tip.  The left posterior cerebral artery is of fetal type. PCA branch vessels are normal bilaterally.   Review of the MIP images confirms the above findings.  IMPRESSION:  1.  Normal variant MRA circle of Jakaleb Payer without significant proximal stenosis, aneurysm, or branch vessel occlusion. 2.  Minimal small vessel disease. 3.  No evidence for posterior circulation stenosis, occlusion, or infarct.  Original Report Authenticated By: Jamesetta Orleans. MATTERN, M.D.   Dg Chest 2 View  10/01/2011  *RADIOLOGY REPORT*  Clinical Data: Stroke  CHEST - 2 VIEW  Comparison: 08/25/2010  Findings: The heart size and pulmonary vascularity are normal. The lungs appear clear and expanded without focal air space disease or consolidation. No blunting of the costophrenic angles.  No significant change since previous study.  IMPRESSION: No evidence of active pulmonary disease.  Original Report Authenticated By: Marlon Pel, M.D.   Dg Swallowing Func-no Report  10/01/2011  CLINICAL DATA: dysphagia   FLUOROSCOPY FOR SWALLOWING FUNCTION STUDY:  Fluoroscopy was provided for swallowing function study, which was  administered by a speech pathologist.  Final results and recommendations  from this study are contained within the speech pathology report.      Medications:  Scheduled:   . aspirin  325 mg Oral Daily  . lamoTRIgine  100 mg Oral BID   Continuous:  [id number]:[id number], meclizine, ondansetron, senna-docusate  Assessment/Plan: 1. 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 The patient likely will need inpatient rehabilitation. Patient was on aspirin at the time of the stroke. The patient is now on a dysphasia diet with nectar thick liquids. Patient is not  having vomiting, and is able to eat. The CT angiogram has been done, but the formal report is normal. I will obtain a rehabilitation consult. Physical, occupational, and speech therapy are working with the patient.       LOS: 2 days   Stephane Junkins KEITH 10/02/2011, 11:28 AM

## 2011-10-03 ENCOUNTER — Inpatient Hospital Stay (HOSPITAL_COMMUNITY): Payer: Medicare Other

## 2011-10-03 DIAGNOSIS — I6330 Cerebral infarction due to thrombosis of unspecified cerebral artery: Secondary | ICD-10-CM

## 2011-10-03 MED ORDER — SODIUM CHLORIDE 0.9 % IV SOLN
Freq: Once | INTRAVENOUS | Status: AC
  Administered 2011-10-03: 22:00:00 via INTRAVENOUS

## 2011-10-03 MED ORDER — SODIUM CHLORIDE 0.9 % IV SOLN
1500.0000 mg | Freq: Once | INTRAVENOUS | Status: AC
  Administered 2011-10-03: 1500 mg via INTRAVENOUS
  Filled 2011-10-03: qty 30

## 2011-10-03 MED ORDER — LORAZEPAM 2 MG/ML IJ SOLN: 2.0000 mg | Freq: Once | INTRAMUSCULAR | Status: DC

## 2011-10-03 MED ORDER — LORAZEPAM 2 MG/ML IJ SOLN
INTRAMUSCULAR | Status: AC
  Filled 2011-10-03: qty 1

## 2011-10-03 NOTE — Progress Notes (Signed)
*  PRELIMINARY RESULTS*  Carotid Doppler has been performed. Bilateral:  No evidence of hemodynamically significant internal carotid artery stenosis.  Vertebral artery flow is antegrade.      Farrel Demark 10/03/2011, 3:44 PM

## 2011-10-03 NOTE — Consult Note (Signed)
Physical Medicine and Rehabilitation Consult Reason for Consult:Stroke Referring Phsyician: DR.Willis Connor [name] is an 65 y.o. male.   HPI: 65 year old right-handed male with past medical history seizure disorder and chronic alcoholism as well as throat cancer with radiation therapy. Patient patient was noted left-sided weakness on December 5 while eating breakfast with friends. CT scan of the head as well as MRI of the brain with and without contrast were negative and patient was discharged home from the emergency department. He was admitted to the hospital on December 7 with falls as well as increased left-sided weakness as well as double vision. 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 CT angiogram of the head showed no evidence of posterior circulation stenosis or occlusion. Maintained on aspirin therapy with full neurology workup ongoing. Physical therapy evaluation on December 8 the patient was moderate assistance to go from stand to sit minimal assist to go from sit to stand. Ambulation was total assistance of 2 with ongoing cues.MBS per SLP and placed on dys 3 nectar liquids. Physical medicine and rehabilitation was consulted at the request of physical therapy to consider inpatient rehabilitation services.  Review of Systems  Constitutional: Positive for malaise/fatigue. Negative for fever.  Eyes: Positive for blurred vision.  Respiratory: Negative for cough and shortness of breath.   Cardiovascular: Negative for chest pain.  Gastrointestinal: Negative for nausea.  Genitourinary: Negative for dysuria.  Musculoskeletal: Positive for falls. Negative for myalgias.  Skin: Negative.   Neurological: Positive for dizziness, seizures and headaches.  Psychiatric/Behavioral: Negative.    Past Medical History  Diagnosis Date  . 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 Head and neck cancer ~ 2009    S/P radiation & chem  . Stroke   . Complication  of anesthesia   . Spinal headache     "had to have 2 blood " patches S/P nephrectomy  . Hypertension   . Seizures     "the bad kind;bite tongue;see Dr.Love; last 07/2011 & 08/2011"  . Chronic kidney disease   . Cancer     h/o neck  . Kidney carcinoma     h/o  . Arthritis    Past Surgical History  Procedure Date  . Nephrectomy 1990's    left  . Joint replacement   . Total knee arthroplasty 2011    left  . Hydrocelectomy 11/2000    left   History reviewed. No pertinent family history. Social History:  reports that he has never smoked. He has never used smokeless tobacco. He reports that he drinks about 12 ounces of alcohol per week. He reports that he does not use illicit drugs. Allergies:  Allergies  Allergen Reactions  . Morphine And Related Nausea And Vomiting   Medications Prior to Admission  Medication Dose Route Frequency Provider Last Rate Last Dose  . acetaminophen (TYLENOL) tablet 650 mg  650 mg Oral Q6H PRN Evie Lacks, MD   650 mg at 10/01/11 1835  . aspirin tablet 325 mg  325 mg Oral Daily Ankit Garg   325 mg at 10/02/11 0939  . iohexol (OMNIPAQUE) 350 MG/ML injection 75 mL  75 mL Intravenous Once PRN Medication Radiologist   75 mL at 10/01/11 0115  . lamoTRIgine (LAMICTAL) tablet 100 mg  100 mg Oral BID Ankit Garg   100 mg at 10/02/11 2135  . meclizine (ANTIVERT) tablet 50 mg  50 mg Oral Q8H PRN Ankit Garg   50 mg at 09/30/11 2133  .  ondansetron (ZOFRAN) tablet 4 mg  4 mg Oral Q6H PRN Ankit Garg      . senna-docusate (Senokot-S) tablet 1 tablet  1 tablet Oral QHS PRN Ankit Garg   1 tablet at 10/02/11 2135   No current outpatient prescriptions on file as of 10/03/2011.    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 Verbal/tactile cues to align with PT to reach midline  Sitting - Scoot to Edge of Bed: 4: Min assist Sitting - Scoot to [place] of Bed Details (indicate cue type and reason): PT requiring hand hold assistance and trunk support to scoot to EOB Transfers Transfers: Yes Sit to Stand: 4: Min assist;From bed Sit to Stand Details (indicate cue type and reason): Assistance, verbal/tactile cues to facilitate improved midline posture (decrease Lt. Lean) with standing. Verbal cues to use upright targets to align body with. Stand to Sit: 3: Mod assist;To chair/3-in-1 Stand to Sit Details: Assistance secondary to generalized instability, verbal cues for UE placement.  Stand Pivot Transfers: 1: +2 Total assist (pt= 70%) Stand Pivot Transfer Details (indicate cue type and reason): Verbal cues for sequencing, using visual targets to align self with with transfer.  Ambulation/Gait Ambulation/Gait: No (Pt unable at this time)    ADL:    Cognition: Cognition Arousal/Alertness: Awake/alert Orientation Level: Oriented X4 Cognition Arousal/Alertness: Awake/alert Overall Cognitive Status: Appears within functional limits for tasks assessed Orientation Level: Oriented X4  Blood pressure 137/78, pulse 81, temperature 97.9 F (36.6 C), temperature  source Oral, resp. rate 18, height 5\' 11"  (1.803 m), weight 86.183 kg (190 lb), SpO2 96.00%. Physical Exam  Constitutional: He is oriented to person, place, and time. He appears well-developed.  HENT:  Head: Normocephalic.  Neck: Normal range of motion. Neck supple. No thyromegaly present.  Cardiovascular: Normal rate and regular rhythm.   Pulmonary/Chest: Effort normal. No respiratory distress.  Abdominal: He exhibits no distension. There is no guarding.  Musculoskeletal: He exhibits no edema.  Neurological: He is alert and oriented to person, place, and time. He has normal reflexes. A cranial nerve deficit and sensory deficit is present.       Patient with left ptosis and mild left-sided facial sensory loss. He had left-sided limb ataxia more than right double vision impacted fine motor coordination of both upper extremities. Patient's vision was grossly impaired only although he was able to see gross objects. He did have double vision although no gross got disconjugate gaze is seen. No nystagmus was appreciated. Patient did not experience vertigo. He had mild right arm and leg sensory loss to pain and temperature. Strength is grossly 4-4+ out of 5 bilaterally. Cognitively she is intact.  Skin: Skin is warm and dry.  Psychiatric: He has a normal mood and  affect.    No results found for this or any previous visit (from the past 24 hour(s)). Dg Swallowing Func-no Report  10/01/2011  CLINICAL DATA: dysphagia   FLUOROSCOPY FOR SWALLOWING FUNCTION STUDY:  Fluoroscopy was provided for swallowing function study, which was  administered by a speech pathologist.  Final results and recommendations  from this study are contained within the speech pathology report.      Assessment/Plan: Diagnosis: Left Wallenberg syndrome 1. Does the need for close, 24 hr/day medical supervision in concert with the patient's rehab needs make it unreasonable for this patient to be served in a less intensive setting?  Yes 2. Co-Morbidities requiring supervision/potential complications: Hypertension, seizure disorder, chronic kidney disease 3. Due to bladder management, bowel management, safety, skin/wound care, disease management and patient education, does the patient require 24 hr/day rehab nursing? Yes 4. Does the patient require coordinated care of a physician, rehab nurse, PT (1-2 hrs/day, 5 days/week), OT (1-2 hrs/day, 5 days/week) and SLP (1-2 hrs/day, 5 days/week) to address physical and functional deficits in the context of the above medical diagnosis(es)? Yes Addressing deficits in the following areas: balance, bathing, bowel/bladder control, dressing, endurance, psychosocial adjustment, speech, strength, swallowing, toileting and transferring 5. Can the patient actively participate in an intensive therapy program of at least 3 hrs of therapy per day at least 5 days per week? Yes 6. The potential for patient to make measurable gains while on inpatient rehab is excellent 7. Anticipated functional outcomes upon discharge from inpatients are modified independent to PT, modified independent to supervision OT, modified independent SLP 8. Estimated rehab length of stay to reach the above functional goals is: 2 weeks 9. Does the patient have adequate social supports to accommodate these discharge functional goals? Yes 10. Anticipated D/C setting: Home 11. Anticipated post D/C treatments: Outpt therapy 12. Overall Rehab/Functional Prognosis: excellent  RECOMMENDATIONS: This patient's condition is appropriate for continued rehabilitative care in the following setting: CIR Patient has agreed to participate in recommended program. Yes Note that insurance prior authorization may be required for reimbursement for recommended care.  Comment: Rehabilitation nurse to followup.   ANGIULLI,DANIEL J. 10/03/2011

## 2011-10-03 NOTE — Consults (Signed)
Called for 4 min. GTC seizure by the patient. When I came to bedside, patient appeared to be post-ictal and was only able to tell me his name. Otherwise, he mumbled and had difficulty collecting his thoughts. He had a left gaze deviation. PERRL. Withdrew right side less to pain than the left side.  I spoke with Dr. Pearlean Brownie who told me that patient's exam appeared to be at baseline.   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 Not t-PA candidate. Had a CT angiography of the head today with no evidence of any large vessel occlusion. Will load with Dilantin 1500 mg. Dilantin level in am. Dr. Pearlean Brownie will try to obtain an MRI with thin cuts through brainstem. CT head has been ordered. Spoke with family at bedside.   Carmell Austria, MD 10/03/2011 7:36 pm

## 2011-10-03 NOTE — Progress Notes (Signed)
Speech Language Pathology  Attempted evaluation.  Pt currently unavailable.  Will continue efforts. Lilas Diefendorf B. Imogene Gravelle, MSP, CCC-SLP [phone]

## 2011-10-03 NOTE — Progress Notes (Signed)
Physical Therapy Treatment Patient Details Name: KALMEN LOLLAR MRN: [id number] DOB: 08/16/1946 Today's Date: 10/03/2011  PT Assessment/Plan  PT - Assessment/Plan Comments on Treatment Session: Pt showing good improvement with mobility and pregression of gait today.  However, LLE spasticity and visual disturbance continues to significantly limit mobility and safety.  Continue to recommend CIR at d/c as option with greatest potential for pt. PT Plan: Discharge plan remains appropriate PT Frequency: Min 4X/week Follow Up Recommendations: Inpatient Rehab Equipment Recommended: Defer to next venue PT Goals  Acute Rehab PT Goals PT Goal Formulation: With patient Time For Goal Achievement: 2 weeks Pt will go Supine/Side to Sit: with modified independence PT Goal: Supine/Side to Sit - Progress: Progressing toward goal Pt will Sit at [facility] of Bed: with modified independence;with no upper extremity support;3-5 min PT Goal: Sit at Edge Of Bed - Progress: Met Pt will go Sit to Supine/Side: with modified independence PT Goal: Sit to Supine/Side - Progress: Progressing toward goal Pt will go Sit to Stand: with supervision PT Goal: Sit to Stand - Progress: Progressing toward goal Pt will go Stand to Sit: with supervision PT Goal: Stand to Sit - Progress: Progressing toward goal Pt will Transfer Bed to Chair/Chair to Bed: with min assist PT Transfer Goal: Bed to Chair/Chair to Bed - Progress: Progressing toward goal Pt will Ambulate: 51 - 150 feet;with min assist;with rolling walker PT Goal: Ambulate - Progress: Progressing toward goal  PT Treatment Precautions/Restrictions  Precautions Precautions: Fall Precaution Comments: Lt lateral lean Required Braces or Orthoses: No Restrictions Weight Bearing Restrictions: No Mobility (including Balance) Bed Mobility Bed Mobility: Yes Rolling Right: 6: Modified independent (Device/Increase time) Right Sidelying to Sit: HOB flat;5:  Supervision Right Sidelying to Sit Details (indicate cue type and reason): vc's given for pt to attend to sitting posture with initial sitting up.  Pt able to self-correct left lean with these vc's Sitting - Scoot to Edge of Bed: 5: Supervision Transfers Transfers: Yes Sit to Stand: 4: Min assist;From bed;From chair/3-in-1;With upper extremity assist Sit to Stand Details (indicate cue type and reason): vc's for hand placement.  Min A given for safety to left side due to left lean.  vc's given to maintain standing initially to verify balance before stepping Stand to Sit: 4: Min assist;With upper extremity assist;To chair/3-in-1 (min-guard A) Stand to Sit Details: vc's for hand placement and to attend to left side with sit Ambulation/Gait Ambulation/Gait: Yes Ambulation/Gait Assistance: 3: Mod assist;Other (comment) (+1 for equipment) Ambulation/Gait Assistance Details (indicate cue type and reason): pt needed only min A first 10 ft but began to fatigue very quickly and experienced spasticity in the LLE that led to a steppage gait pattern, increased left lean and the need for mod A.  Gait pattern continued to deteriorate with distance/ fatigue Ambulation Distance (Feet): 35 Feet Assistive device: Rolling walker Gait Pattern: Left steppage;Left genu recurvatum;Step-through pattern Stairs: No Wheelchair Mobility Wheelchair Mobility: No  Posture/Postural Control Posture/Postural Control: Postural limitations Postural Limitations: left lateral lean, not as extreme as on initial eval Balance Balance Assessed: Yes Static Standing Balance Static Standing - Balance Support: Bilateral upper extremity supported;During functional activity Static Standing - Level of Assistance: 4: Min assist Static Standing - Comment/# of Minutes: 10 Dynamic Standing Balance Dynamic Standing - Level of Assistance: 3: Mod assist Exercise  General Exercises - Lower Extremity Long Arc Quad: AROM;Strengthening;Left;10  reps;Seated;Limitations (focusing on smooth control, +/- resistance) Long Arc Quad Limitations: pt has difficulty controlling mvmt due to spasticity, improves  when resistance given Mini-Sqauts: Both;AROM;Strengthening;5 reps;Limitations;Standing Mini Squats Limitations: focusing on slow, smooth mvmt.  Controlling mvmt difficult for pt Other Exercises Other Exercises: encourage gaze stabiliation with mobility and sitting/standing balance End of Session PT - End of Session Equipment Utilized During Treatment: Gait belt Activity Tolerance: Patient tolerated treatment well Patient left: in chair;with call bell in reach;with family/visitor present General Behavior During Session: [facility] for tasks performed Cognition: [facility] for tasks performed  Khari Mally, [place]  [phone] 10/03/2011, 4:07 PM

## 2011-10-03 NOTE — Progress Notes (Signed)
Occupational Therapy Evaluation Patient Details Name: Connor [name] MRN: [id number] DOB: Nov 27, 1945 Today's Date: 10/03/2011  Problem List:  Patient Active Problem List  Diagnoses  . Stroke, Wallenberg's syndrome  . HTN (hypertension)  . Seizure disorder    Past Medical History:  Past Medical History  Diagnosis Date  . 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 Head and neck cancer ~ 2009    S/P radiation & chem  . Stroke   . Complication of anesthesia   . Spinal headache     "had to have 2 blood " patches S/P nephrectomy  . Hypertension   . Seizures     "the bad kind;bite tongue;see Dr.Love; last 07/2011 & 08/2011"  . Chronic kidney disease   . Cancer     h/o neck  . Kidney carcinoma     h/o  . Arthritis    Past Surgical History:  Past Surgical History  Procedure Date  . Nephrectomy 1990's    left  . Joint replacement   . Total knee arthroplasty 2011    left  . Hydrocelectomy 11/2000    left    OT Assessment/Plan/Recommendation OT Assessment Clinical Impression Statement: Patient admitted with stroke like symptoms consistent with Wallenburg Syndrome. Will benefit from skilled OT in acute setting to maximize independence with ADL and ADL mobility to facilitate d/c to next venue. OT Recommendation/Assessment: Patient will need skilled OT in the acute care venue OT Problem List: Impaired balance (sitting and/or standing);Impaired vision/perception;Decreased coordination;Decreased knowledge of use of DME or AE;Decreased knowledge of precautions;Impaired sensation OT Therapy Diagnosis : Paresis;Disturbance of vision OT Plan OT Frequency: Min 2X/week OT Treatment/Interventions: Self-care/ADL training;Neuromuscular education;DME and/or AE instruction;Therapeutic activities;Cognitive remediation/compensation;Visual/perceptual remediation/compensation;Patient/family education;Balance training OT Recommendation Recommendations for Other  Services: Rehab consult Follow Up Recommendations: Inpatient Rehab Equipment Recommended: Defer to next venue Individuals Consulted Consulted and Agree with Results and Recommendations: Patient OT Goals Acute Rehab OT Goals OT Goal Formulation: With patient Time For Goal Achievement: 7 days ADL Goals Pt Will Perform Grooming: Standing at sink (Min guard assist ) ADL Goal: Grooming - Progress: Other (comment) Pt Will Perform Upper Body Bathing: Sitting in shower (Min guard assist for retrieval and supervision with task) ADL Goal: Upper Body Bathing - Progress: Other (comment) Pt Will Perform Lower Body Bathing: with min assist;Sit to stand in shower ADL Goal: Lower Body Bathing - Progress: Other (comment) Pt Will Perform Upper Body Dressing: with supervision;Sitting, bed (Min guard assist with retrieval) ADL Goal: Upper Body Dressing - Progress: Other (comment) Pt Will Perform Lower Body Dressing: with min assist;Sit to stand from bed;Unsupported ADL Goal: Lower Body Dressing - Progress: Other (comment) Pt Will Transfer to Toilet: Ambulation;with DME (Min guard assist) ADL Goal: Statistician - Progress: Other (comment) Pt Will Perform Tub/Shower Transfer: with min assist;Ambulation;with DME ADL Goal: Tub/Shower Transfer - Progress: Other (comment)  OT Evaluation Precautions/Restrictions  Precautions Precautions: Fall Precaution Comments: Lt lateral lean ADL ADL Eating/Feeding: Performed;Modified independent (increased time to open containers) Where Assessed - Eating/Feeding: Edge of bed Grooming: Performed;Set up;Supervision/safety Grooming Details (indicate cue type and reason): Safety secondary to patient with decreased hot/cold differentiation. Assessed in chair- will require assist for balance if performed at sink Where Assessed - Grooming: Sitting, chair Upper Body Dressing: Simulated;Set up;Supervision/safety Upper Body Dressing Details (indicate cue type and reason):  Min assist for sitting balance as patient with left lateral lean. Where Assessed - Upper Body Dressing: Sitting, bed Lower Body Dressing:  Simulated;Minimal assistance Where Assessed - Lower Body Dressing: Sit to stand from bed Toilet Transfer: Simulated;Moderate assistance Toilet Transfer Details (indicate cue type and reason): simulated from EOB to chair. VC to bring BOS forward to facilitate sit to stand and VC for hand placement Toilet Transfer Method: Stand pivot Toileting - Clothing Manipulation: Simulated;Minimal assistance Where Assessed - Toileting Clothing Manipulation:  (simulated sit to stand from EOB) Toileting - Hygiene: Simulated;Minimal assistance Where Assessed - Toileting Hygiene: Standing Vision/Perception  Vision - History Baseline Vision: Wears glasses only for reading Patient Visual Report: Unable to keep objects in focus (oscillopsia) Vision - Assessment Eye Alignment: Impaired (comment) Vision Assessment: Vision tested Ocular Range of Motion: Within Functional Limits Alignment/Gaze Preference: Gaze left Convergence: Impaired (comment) Additional Comments: Bilateral eyes with tendency to pull left with left eye worse than right.  Right beating nystagmus noted with "null point" in superior quadrants. Patient reports gaze stabilization helps minimally- only able to maintain for ~10sec at a time.   Cognition Cognition Arousal/Alertness: Awake/alert Overall Cognitive Status: Appears within functional limits for tasks assessed Orientation Level: Oriented X4 Sensation/Coordination Sensation Light Touch: Impaired Detail Light Touch Impaired Details: Impaired RUE (impaired right side of face as well orbital area on left ) Hot/Cold: Impaired Detail Hot/Cold Impaired Details: Impaired RUE Coordination Gross Motor Movements are Fluid and Coordinated: Yes Fine Motor Movements are Fluid and Coordinated: No (slow deliberate movements) Extremity Assessment RUE  Assessment RUE Assessment: Within Functional Limits LUE Assessment LUE Assessment: Within Functional Limits Mobility  Bed Mobility Supine to Sit: 5: Supervision;With rails;HOB flat Supine to Sit Details (indicate cue type and reason): patient came to long sitting. required cues to move legs to sit EOB Sitting - Scoot to Edge of Bed: 5: Supervision Transfers Sit to Stand: 4: Min assist;From bed Sit to Stand Details (indicate cue type and reason): Assistance, verbal/tactile cues to facilitate improved midline posture (decrease Lt. Lean) with standing. Verbal cues to use upright targets to align body with. Stand to Sit: 4: Min assist;With armrests Exercises Other Exercises Other Exercises: encourage gaze stabiliation with mobility and sitting/standing balance End of Session OT - End of Session Equipment Utilized During Treatment: Gait belt Activity Tolerance: Patient tolerated treatment well Patient left: in chair;with call bell in reach Nurse Communication: Mobility status for transfers;Mobility status for ambulation General Behavior During Session: [facility] for tasks performed Cognition: [facility] for tasks performed   Deontae Robson 10/03/2011, 1:40 PM

## 2011-10-03 NOTE — Progress Notes (Signed)
Echocardiogram 2D Echocardiogram has been performed.  Connor [name] 10/03/2011, 4:44 PM

## 2011-10-03 NOTE — Progress Notes (Signed)
I met with patient at bedside. He will benefit from an inpatient acute rehabilitation stay prior to discharge home. I will begin authorization with [facility] and await their approval. Please call me with any questions pager [phone].

## 2011-10-03 NOTE — Progress Notes (Signed)
Stroke Team Progress Note  SUBJECTIVE  Connor [name] is a 65 y.o. male with a  past medical history of seizure disorder and chronic alcoholism who is a patient of Dr. Sandria Manly since 1994. Patient also has history of throat cancer and underwent radiation therapy and chemotherapy at [facility]. His most recent CT of the neck with contrast on 07/12/2011 showed no evidence of recurrent disease. On wednesday 09/28/2011 he was eating breakfast with his friends in a restaurant and noted the onset of left-sided headache with numbness in the left for head extending down the left side of his face and then involving his entire body with a tingling sensation or numbness, worse on the left side of his body than the right. He became weak in both arms and both legs and slumped in his chair. There was no chest pain or out the patient all her seizure. 911 was called and he was seen at hospital emergency room. CT scan of the brain without contrast was unremarkable and MRI study of the brain with and without contrast which was reviewed by Dr. Benard Rink was negative for acute stroke on December 5. He was sent home after the evaluation but noted that he was falling on his left side. He also noted double vision and oscillopsia. He did not have hiccups. He also noted difficulty voiding.  Overall he feels his condition is stable. No new complaints today.  OBJECTIVE Most recent Vital Signs: Temp: 97.9 F (36.6 C) (12/10 1306) BP: 126/79 mmHg (12/10 1306) Pulse Rate: 80  (12/10 1306) Respiratory Rate: 20 O2 Saturdation: 98%  Intake/Output from previous day: 12/09 0701 - 12/10 0700 In: -  Out: 900 [Urine:900]  IV Fluid Intake:    Diet:  Dysphagia thin liquids  Activity:  Up with assistance  DVT Prophylaxis:  SCDs   Studies: CBC    Component Value Date/Time   WBC 6.1 09/30/2011 1752   RBC 4.80 09/30/2011 1752   HGB 15.0 09/30/2011 1752   HCT 41.3 09/30/2011 1752   PLT 249 09/30/2011 1752   MCV 86.0  09/30/2011 1752   MCH 31.3 09/30/2011 1752   MCHC 36.3* 09/30/2011 1752   RDW 12.6 09/30/2011 1752   LYMPHSABS 1.0 09/30/2011 1752   MONOABS 0.8 09/30/2011 1752   EOSABS 0.3 09/30/2011 1752   BASOSABS 0.0 09/30/2011 1752   CMP    Component Value Date/Time   NA 132* 09/30/2011 1752   K 4.3 09/30/2011 1752   CL 95* 09/30/2011 1752   CO2 29 09/30/2011 1752   GLUCOSE 116* 09/30/2011 1752   BUN 7 09/30/2011 1752   CREATININE 0.94 09/30/2011 1752   CALCIUM 9.7 09/30/2011 1752   PROT 7.2 09/30/2011 1752   ALBUMIN 3.6 09/30/2011 1752   AST 18 09/30/2011 1752   ALT 13 09/30/2011 1752   ALKPHOS 75 09/30/2011 1752   BILITOT 0.4 09/30/2011 1752   GFRNONAA 87* 09/30/2011 1752   GFRAA >90 09/30/2011 1752   COAGS Lab Results  Component Value Date   INR 1.04 08/25/2010   Lipid Panel    Component Value Date/Time   CHOL 192 10/01/2011 0500   TRIG 130 10/01/2011 0500   HDL 54 10/01/2011 0500   CHOLHDL 3.6 10/01/2011 0500   VLDL 26 10/01/2011 0500   LDLCALC 112* 10/01/2011 0500   HgbA1C  Lab Results  Component Value Date   HGBA1C 5.5 10/01/2011   Urine Drug Screen  No results found for this basename: labopia, cocainscrnur, labbenz, amphetmu, thcu, labbarb  Alcohol Level No results found for this basename: eth     No results found.  CT of the brain  unremarkable  CT angio head 1. Normal variant MRA circle of Willis without significant  proximal stenosis, aneurysm, or branch vessel occlusion.  2. Minimal small vessel disease.  3. No evidence for posterior circulation stenosis, occlusion, or infarct.  MRI of the brain  No acute stroke  2D Echocardiogram  Not ordered  Carotid Doppler  Not ordered  CXR  No evidence of active pulmonary disease.  EKG  Not ordered here  Physical Exam   pleasant elderly Caucasian male in no distress. Afebrile. Neck is supple without bruit.Cardiac exam normal heart sounds.Lungs clear to auscultation.  Neurological exam   Awake alert oriented x 3. Speech and  language appear normal. eye movements are full but saccadic dysmetria to right with nystagmus. 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 No palatal weakness. Good gag. Decrease sensation left face and right body. No drift but mild left dysmetria and mild truncal ataxia. Gait deferred .ASSESSMENT [name] is a 65 y.o. 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 On aspirin 325 mg orally every day for secondary stroke prevention.  Stroke risk factors:  hypertension and previous Wallenberg stroke syndrome  Hospital day # 3  TREATMENT/PLAN Continue aspirin 325 mg orally every day for secondary stroke prevention. TEE. Have consulted for cardiology as his primary is Dr. Kevan Ny. Check carotid Doppler and 2-D echocardiogram as well. Repeat Limited MRI with thin cuts through the brainstem.  Joaquin Music, ANP-BC, GNP-BC [facility] Pager: [phone] 10/03/2011 1:56 PM  Dr. Delia Heady, Stroke Center Medical Director, has personally reviewed chart, pertinent data, examined the patient and developed the plan of care.

## 2011-10-04 ENCOUNTER — Encounter (HOSPITAL_COMMUNITY)
Admission: AD | Disposition: A | Discharge status: Rehabilitation Facility | Payer: Self-pay | Source: Ambulatory Visit | Attending: Neurology

## 2011-10-04 ENCOUNTER — Encounter (HOSPITAL_COMMUNITY): Payer: Self-pay | Admitting: *Deleted

## 2011-10-04 HISTORY — PX: TEE WITHOUT CARDIOVERSION: SHX5443

## 2011-10-04 SURGERY — ECHOCARDIOGRAM, TRANSESOPHAGEAL
Anesthesia: Moderate Sedation

## 2011-10-04 MED ORDER — FENTANYL CITRATE 0.05 MG/ML IJ SOLN
INTRAMUSCULAR | Status: AC
  Filled 2011-10-04: qty 2

## 2011-10-04 MED ORDER — MIDAZOLAM HCL 10 MG/2ML IJ SOLN: 10.0000 mg | Freq: Once | INTRAMUSCULAR | Status: DC

## 2011-10-04 MED ORDER — LIDOCAINE VISCOUS 2 % MT SOLN
OROMUCOSAL | Status: DC | PRN
  Administered 2011-10-04: 10 mL

## 2011-10-04 MED ORDER — MIDAZOLAM HCL 10 MG/2ML IJ SOLN
INTRAMUSCULAR | Status: DC | PRN
  Administered 2011-10-04 (×2): 2 mg via INTRAVENOUS

## 2011-10-04 MED ORDER — LAMOTRIGINE 100 MG PO TABS
100.0000 mg | ORAL_TABLET | Freq: Every day | ORAL | Status: DC
  Administered 2011-10-05: 100 mg via ORAL
  Filled 2011-10-04: qty 1

## 2011-10-04 MED ORDER — SODIUM CHLORIDE 0.45 % IV SOLN: INTRAVENOUS | Status: DC

## 2011-10-04 MED ORDER — FENTANYL CITRATE 0.05 MG/ML IJ SOLN: 250.0000 ug | Freq: Once | INTRAMUSCULAR | Status: DC

## 2011-10-04 MED ORDER — BUTAMBEN-TETRACAINE-BENZOCAINE 2-2-14 % EX AERO
INHALATION_SPRAY | CUTANEOUS | Status: DC | PRN
  Administered 2011-10-04: 2 via TOPICAL

## 2011-10-04 MED ORDER — FENTANYL CITRATE 0.05 MG/ML IJ SOLN
INTRAMUSCULAR | Status: DC | PRN
  Administered 2011-10-04 (×2): 25 ug via INTRAVENOUS

## 2011-10-04 MED ORDER — SODIUM CHLORIDE 0.9 % IJ SOLN
3.0000 mL | Freq: Two times a day (BID) | INTRAMUSCULAR | Status: DC
  Administered 2011-10-04: 3 mL via INTRAVENOUS

## 2011-10-04 MED ORDER — SODIUM CHLORIDE 0.9 % IJ SOLN: 3.0000 mL | INTRAMUSCULAR | Status: DC | PRN

## 2011-10-04 MED ORDER — BENZOCAINE 20 % MT SOLN
1.0000 "application " | OROMUCOSAL | Status: DC | PRN
  Filled 2011-10-04: qty 57

## 2011-10-04 MED ORDER — LAMOTRIGINE 200 MG PO TABS
200.0000 mg | ORAL_TABLET | Freq: Every day | ORAL | Status: DC
  Administered 2011-10-04: 200 mg via ORAL
  Filled 2011-10-04 (×3): qty 1

## 2011-10-04 MED ORDER — MIDAZOLAM HCL 10 MG/2ML IJ SOLN
INTRAMUSCULAR | Status: AC
  Filled 2011-10-04: qty 2

## 2011-10-04 MED ORDER — SODIUM CHLORIDE 0.9 % IV SOLN
250.0000 mL | INTRAVENOUS | Status: DC | PRN
  Administered 2011-10-04: 500 mL via INTRAVENOUS

## 2011-10-04 MED ORDER — LIDOCAINE VISCOUS 2 % MT SOLN
OROMUCOSAL | Status: AC
  Filled 2011-10-04: qty 15

## 2011-10-04 MED ORDER — LIDOCAINE VISCOUS 2 % MT SOLN
20.0000 mL | OROMUCOSAL | Status: DC
  Filled 2011-10-04: qty 20

## 2011-10-04 NOTE — Progress Notes (Signed)
  Echocardiogram 2D Echocardiogram has been performed.  Connor [name] Connor [name] 10/04/2011, 4:10 PM

## 2011-10-04 NOTE — Progress Notes (Signed)
Speech Language Pathology  Cognitive-Linguistic Evaluation Patient Details Name: Connor [name] MRN: [id number] DOB: 05-09-1946 Today's Date: 10/04/2011  Problem List:  Patient Active Problem List  Diagnoses  . Stroke, Wallenberg's syndrome  . HTN (hypertension)  . Seizure disorder    Past Medical History:  Past Medical History  Diagnosis Date  . 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 Head and neck cancer ~ 2009    S/P radiation & chem  . Stroke   . Complication of anesthesia   . Spinal headache     "had to have 2 blood " patches S/P nephrectomy  . Hypertension   . Seizures     "the bad kind;bite tongue;see Dr.Love; last 07/2011 & 08/2011"  . Chronic kidney disease   . Cancer     h/o neck  . Kidney carcinoma     h/o  . Arthritis    Past Surgical History:  Past Surgical History  Procedure Date  . Nephrectomy 1990's    left  . Joint replacement   . Total knee arthroplasty 2011    left  . Hydrocelectomy 11/2000    left    SLP Assessment/Plan/Recommendation Assessment Clinical Impression Statement: Pt appears to be at baseline from a cognitive-linguistic standpoint.   SLP Recommendation/Assessment: Patent does not need any further Speech Lanaguage Pathology Services No Skilled Speech Therapy: Patient at baseline level of functioning Plan Potential to Achieve Goals: Good Recommendation Recommendations for Other Services: Other (Comment) (ST to follow briefly for diet tolerance, following TEE.) Follow up Recommendations: None Equipment Recommended: None recommended by SLP Individuals Consulted Consulted and Agree with Results and Recommendations: Patient;Family member/caregiver Family Member Consulted : step-son  SLP Goals     SLP Evaluation Prior Functioning  Prior Functional Status Cognitive/Linguistic Baseline: Within functional limits Type of Home: House Lives With: Family (step son) Receives Help From:  Family Vocation: Retired Financial risk analyst Overall Cognitive Status: Appears within functional limits for tasks assessed Arousal/Alertness: Awake/alert Orientation Level: Oriented X4 Attention: Selective Selective Attention: Appears intact Memory: Appears intact Awareness: Appears intact Problem Solving: Appears intact Safety/Judgment: Appears intact Comprehension  Auditory Comprehension Yes/No Questions: Within Functional Limits Commands: Within Functional Limits Conversation: Complex Visual Recognition/Discrimination Discrimination: Exceptions to [facility] (Pt wears Reading glasses; pt reports blurred vision) Reading Comprehension Reading Status: Within funtional limits Expression  Expression Primary Mode of Expression: Verbal Verbal Expression Initiation: No impairment Repetition: No impairment Naming: No impairment Pragmatics: No impairment Non-Verbal Means of Communication: Not applicable Written Expression Dominant Hand: Right Written Expression: Within Functional Limits Oral/Motor  Oral Motor/Sensory Function Labial ROM: Within Functional Limits Labial Symmetry: Within Functional Limits Labial Strength: Within Functional Limits Labial Sensation: Within Functional Limits Lingual ROM: Within Functional Limits Lingual Symmetry: Within Functional Limits Lingual Strength: Within Functional Limits Lingual Sensation: Within Functional Limits Facial ROM: Within Functional Limits Facial Symmetry: Within Functional Limits Facial Strength: Within Functional Limits Facial Sensation: Within Functional Limits Velum: Within Functional Limits Mandible: Within Functional Limits Motor Speech Intelligibility: Intelligible Mahonri Seiden B. Amir Fick, MSP, CCC-SLP [phone]

## 2011-10-04 NOTE — Plan of Care (Signed)
Problem: Phase I Progression Outcomes Goal: Other Phase I Outcomes/Goals Outcome: Progressing SLE completed.   Connor [name] B. Tylar Merendino, MSP, CCC-SLP [phone]

## 2011-10-04 NOTE — Progress Notes (Signed)
Physical Therapy Treatment Patient Details Name: Connor [name] MRN: [id number] DOB: [birth year] Today's Date: 10/04/2011  PT Assessment/Plan  PT - Assessment/Plan Comments on Treatment Session: Per chart review pt had seizure yesterday evening.  Pt eager/motivated to participate in PT session.   PT Plan: Discharge plan needs to be updated PT Frequency: Min 4X/week Follow Up Recommendations: Inpatient Rehab Equipment Recommended: Defer to next venue PT Goals  Acute Rehab PT Goals PT Goal: Supine/Side to Sit - Progress: Progressing toward goal PT Goal: Sit at Edge Of Bed - Progress: Progressing toward goal PT Goal: Sit to Stand - Progress: Progressing toward goal PT Goal: Stand to Sit - Progress: Progressing toward goal PT Goal: Ambulate - Progress: Progressing toward goal  PT Treatment Precautions/Restrictions  Precautions Precautions: Fall Precaution Comments: Leans to Left Required Braces or Orthoses: No Restrictions Weight Bearing Restrictions: No Mobility (including Balance) Bed Mobility Rolling Right: 6: Modified independent (Device/Increase time) Right Sidelying to Sit: HOB flat;6: Modified independent (Device/Increase time) Right Sidelying to Sit Details (indicate cue type and reason): pt aware of left lateral lean & able to self correct Transfers Sit to Stand: 4: Min assist;From bed Sit to Stand Details (indicate cue type and reason): cues for hand placement; (A) for balance due to left lateral lean.  cues to ensure balance before initating ambulation.   Stand to Sit: 4: Min assist Stand to Sit Details: (A) to control descent & locate hands on armrests of recliner.  Cues to position hips/body in center of recliner- pt leaning/sitting to left.   Ambulation/Gait Ambulation/Gait Assistance: 3: Mod assist Ambulation/Gait Assistance Details (indicate cue type and reason): pt=70%; (A) for balance due to left lateral lean.  Pt with narrow BOS, decreased coordination with  gait pattern/steps as distance increased.   Ambulation Distance (Feet): 50 Feet Assistive device: Rolling walker Gait Pattern: Step-through pattern;Lateral trunk lean to left Stairs: No Wheelchair Mobility Wheelchair Mobility: No  Dynamic Sitting Balance Dynamic Sitting - Balance Activities: Forward lean/weight shifting;Reaching across midline;Trunk control activities;Lateral lean/weight shifting;Reaching for objects Dynamic Sitting - Comments: performed activities x 8 minutes.   Exercise    End of Session PT - End of Session Equipment Utilized During Treatment: Gait belt Activity Tolerance: Patient tolerated treatment well Patient left: in chair General Behavior During Session: [facility] for tasks performed Cognition: [facility] for tasks performed  Lara Mulch 10/04/2011, 3:06 PM

## 2011-10-04 NOTE — Progress Notes (Signed)
Pt had 4 min grand mal seizure. Pt now in post-ictal state and is lethargic but will respond to his name. Pt's vitals now stable. Dr. Lyman Speller called and he stated he would be up to assess patient. Family @ the bedside. RN will continue to monitor pt. Rema Fendt, RN

## 2011-10-04 NOTE — PMR Pre-admission (Signed)
PMR Admission Coordinator Pre-Admission Assessment  Patient:  Connor [name] is an 65 y.o., male MRN:  [id number] DOB:  10-Jun-1946 Height:  Height: 5\' 11"  (180.3 cm) Weight:  Weight: 86.183 kg (190 lb)  Insurance Information: HMO:yes    PPO:     PCP:     IPA:     80/20:     OTHER:medicare replacement policy PRIMARY:Blue Medicare      Policy#:[id number]      Subscriber:pt CM Name:Marsha      Phone#:[phone]     Fax#:[phone] Pre-Cert#:[id number] update needed 12/19 approved by Cedric at [phone] in Marsha's absence      Employer:retired Benefits:  Phone #:[phone]     Name:10/04/2011 Eff. Date:02/22/2011     Deduct:none      Out of Pocket Max:$3400      Life VFI:EPPI CIR:$170/days 1-6      SNF:no copay days 1 through 10. $50/day copay days 11 through 100 Outpatient:$35/visit      Home Health:100%      DME:80%     Co-Pay:20% Providers:in network SECONDARY:none       Current Medical History:   Patient Admitting Diagnosis:  Left Wallenberg syndrome  History of Present Illness: To the ED 09/28/11 with left sided weakness. CT and MRI of head were negative and pt discharged home. Admitted 12/7 with falls and increased left sided weakness and double vision. 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 Seizure 12/10 and his Lamictal was increased. TEE 12/11 with no embolic source found.  Patients Past Medical History:   Past Medical History  Diagnosis Date  . 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 Head and neck cancer ~ 2009    S/P radiation & chem  . Stroke   . Hypertension   . Seizures     "the bad kind;bite tongue;see Dr.Love; last 07/2011 & 08/2011"  . Chronic kidney disease   . Cancer     h/o neck  . Kidney carcinoma     h/o  . Arthritis    Family Medical History:  family history is not on file. NIH Stroke scale:NIH 2  Prior Rehab/Hospitalizations: none  Medications  Current Medications: Current facility-administered  medications:0.45 % sodium chloride infusion, , Intravenous, Continuous, Traci R Turner, MD;  0.9 %  sodium chloride infusion, 250 mL, Intravenous, PRN, Quintella Reichert, MD;  0.9 %  sodium chloride infusion, 250 mL, Intravenous, PRN, Donato Schultz, MD, Last Rate: 20 mL/hr at 10/04/11 1437, 500 mL at 10/04/11 1437;  acetaminophen (TYLENOL) tablet 650 mg, 650 mg, Oral, Q6H PRN, Evie Lacks, MD, 650 mg at 10/03/11 2205 aspirin tablet 325 mg, 325 mg, Oral, Daily, Ankit Garg, 325 mg at 10/05/11 1011;  lamoTRIgine (LAMICTAL) tablet 100 mg, 100 mg, Oral, Daily, Annie Main, NP, 100 mg at 10/05/11 1011;  lamoTRIgine (LAMICTAL) tablet 200 mg, 200 mg, Oral, QHS, Annie Main, NP, 200 mg at 10/04/11 2246;  meclizine (ANTIVERT) tablet 50 mg, 50 mg, Oral, Q8H PRN, Ankit Garg, 50 mg at 09/30/11 2133;  ondansetron (ZOFRAN) tablet 4 mg, 4 mg, Oral, Q6H PRN, Ankit Garg senna-docusate (Senokot-S) tablet 1 tablet, 1 tablet, Oral, QHS PRN, Ankit Garg, 1 tablet at 10/03/11 2205;  sodium chloride 0.9 % injection 3 mL, 3 mL, Intravenous, Q12H, Quintella Reichert, MD, 3 mL at 10/05/11 1011;  sodium chloride 0.9 % injection 3 mL, 3 mL, Intravenous, PRN, Quintella Reichert, MD;  sodium chloride 0.9 % injection 3 mL, 3 mL,  Intravenous, Q12H, Donato Schultz, MD, 3 mL at 10/04/11 2252 sodium chloride 0.9 % injection 3 mL, 3 mL, Intravenous, PRN, Donato Schultz, MD;  DISCONTD: 0.45 % sodium chloride infusion, , Intravenous, Continuous, Donato Schultz, MD;  DISCONTD: benzocaine (HURRICAINE) 20 % mouth spray 1 application, 1 application, Mouth/Throat, PRN, Donato Schultz, MD;  DISCONTD: butamben-tetracaine-benzocaine (CETACAINE) spray, , , PRN, Donato Schultz, MD, 2 spray at 10/04/11 1524 DISCONTD: fentaNYL (SUBLIMAZE) injection 250 mcg, 250 mcg, Intravenous, Once, Donato Schultz, MD;  DISCONTD: fentaNYL (SUBLIMAZE) injection, , , PRN, Donato Schultz, MD, 25 mcg at 10/04/11 1529;  DISCONTD: lamoTRIgine (LAMICTAL) tablet 100 mg, 100 mg, Oral, BID, Ankit Garg, 100 mg at  10/04/11 0946;  DISCONTD: lidocaine (XYLOCAINE) 2 % viscous mouth solution 20 mL, 20 mL, Mouth/Throat, To Endo, Donato Schultz, MD DISCONTD: lidocaine (XYLOCAINE) 2 % viscous mouth solution, , , PRN, Donato Schultz, MD, 10 mL at 10/04/11 1524;  DISCONTD: midazolam (VERSED) 10 MG/2ML injection 10 mg, 10 mg, Intravenous, Once, Donato Schultz, MD;  DISCONTD: midazolam (VERSED) 10 MG/2ML injection, , , PRN, Donato Schultz, MD, 2 mg at 10/04/11 1529  Precautions/Special Needs:    Additional Precautions/Restrictions: Precautions Precautions: Fall Precaution Comments: Leans to Left Required Braces or Orthoses: No Restrictions Weight Bearing Restrictions: No  Therapy Assessments Cognition Arousal/Alertness: Awake/alert Overall Cognitive Status: Appears within functional limits for tasks assessed Orientation Level: Oriented X4   Home Living Lives With: Family (step son) Receives Help From: Family Type of Home: House Home Layout: Two level (basement and one level) Home Access: Stairs to enter Entrance Stairs-Rails: None Entrance Stairs-Number of Steps: 2 Bathroom Shower/Tub: Health visitor: Standard Home Adaptive Equipment: Bedside commode/3-in-1;Shower chair with back;Walker - rolling   Sensation Light Touch: Impaired Detail Light Touch Impaired Details: Impaired RUE (impaired right side of face as well orbital area on left ) Hot/Cold: Impaired Detail Hot/Cold Impaired Details: Impaired RUE Additional Comments: Pt reporting difficulty with Hot/Cold determination in UEs and LEs Cognition Arousal/Alertness: Awake/alert Overall Cognitive Status: Appears within functional limits for tasks assessed Orientation Level: Oriented X4 Coordination Gross Motor Movements are Fluid and Coordinated: Yes Fine Motor Movements are Fluid and Coordinated: No (slow deliberate movements) Heel Shin Test: No Clear deficits  Prior Function: Prior Function Level of Independence: Independent with  basic ADLs Driving: Yes Vocation: Retired Comments: Was playing golf last week Patient reports seizures every two to three weeks.  ADLs/Mobility:Current Functional Level: ADL Eating/Feeding: Performed;Modified independent (increased time to open containers) Where Assessed - Eating/Feeding: Edge of bed Grooming: Performed;Set up;Supervision/safety Grooming Details (indicate cue type and reason): Safety secondary to patient with decreased hot/cold differentiation. Assessed in chair- will require assist for balance if performed at sink Where Assessed - Grooming: Sitting, chair Upper Body Dressing: Simulated;Set up;Supervision/safety Upper Body Dressing Details (indicate cue type and reason): Min assist for sitting balance as patient with left lateral lean. Where Assessed - Upper Body Dressing: Sitting, bed Lower Body Dressing: Simulated;Minimal assistance Where Assessed - Lower Body Dressing: Sit to stand from bed Toilet Transfer: Simulated;Moderate assistance Toilet Transfer Details (indicate cue type and reason): simulated from EOB to chair. VC to bring BOS forward to facilitate sit to stand and VC for hand placement Toilet Transfer Method: Stand pivot Toileting - Clothing Manipulation: Simulated;Minimal assistance Where Assessed - Toileting Clothing Manipulation:  (simulated sit to stand from EOB) Toileting - Hygiene: Simulated;Minimal assistance Where Assessed - Toileting Hygiene: Standing  Bed Mobility Bed Mobility: No Rolling Right: 6: Modified independent (Device/Increase time) Rolling Right Details (indicate cue  type and reason): verbal cues for sequencing, utilizing visual targets with mobility  Right Sidelying to Sit: HOB flat;6: Modified independent (Device/Increase time) Right Sidelying to Sit Details (indicate cue type and reason): pt aware of left lateral lean & able to self correct Supine to Sit: 5: Supervision;With rails;HOB flat Supine to Sit Details (indicate cue type  and reason): patient came to long sitting. required cues to move legs to sit EOB Sitting - Scoot to Edge of Bed: 5: Supervision Sitting - Scoot to [place] of Bed Details (indicate cue type and reason): PT requiring hand hold assistance and trunk support to scoot to EOB Transfers Transfers: Yes Sit to Stand: Other (comment) (Min Guard (A)) Sit to Stand Details (indicate cue type and reason): Min Guard (A) when using bilateral UE's to push up from recliner but Min (A) with unilateral UE use.  Performed sit<>stand 5x's with minimal use of UE's to focus on strengthening & then 3x's with encouragement of UE use to increase safety.   Stand to Sit: 4: Min assist Stand to Sit Details: Increased Min (A) to control descent after ambulating but then required decreased (A) after seated rest break & then performing sit<>stands.   Stand Pivot Transfers: 1: +2 Total assist (pt= 70%) Stand Pivot Transfer Details (indicate cue type and reason): Verbal cues for sequencing, using visual targets to align self with with transfer.  Ambulation/Gait Ambulation/Gait: Yes Ambulation/Gait Assistance: 3: Mod assist Ambulation/Gait Assistance Details (indicate cue type and reason): pt=70%; (A) for balance due to left lateral lean.  Pt with narrow BOS, decreased coordination with gait pattern/steps as distance increased.   Ambulation Distance (Feet): 50 Feet Assistive device: Rolling walker Gait Pattern: Step-through pattern;Lateral trunk lean to left Stairs: No Wheelchair Mobility Wheelchair Mobility: No Posture/Postural Control Posture/Postural Control: Postural limitations Postural Limitations: left lateral lean, not as extreme as on initial eval Balance Balance Assessed: Yes Static Sitting Balance Static Sitting - Balance Support: Bilateral upper extremity supported;No upper extremity supported;Feet supported Static Sitting - Level of Assistance: 4: Min assist Static Sitting - Comment/# of Minutes: 8-10 min. Pt  practicing aligning with PT at midline. Without cuing pt has significant lean to Lt. by end of 8-10 minutes pt able to hold midline without verbal or tactile cues. Verbal cues for increasing weightbearing onto Rt. hip.  Dynamic Sitting Balance Dynamic Sitting - Balance Activities: Forward lean/weight shifting;Reaching across midline;Trunk control activities;Lateral lean/weight shifting;Reaching for objects Dynamic Sitting - Comments: performed activities x 8 minutes.   Static Standing Balance Static Standing - Balance Support: Bilateral upper extremity supported;During functional activity Static Standing - Level of Assistance: 4: Min assist Static Standing - Comment/# of Minutes: 10 Dynamic Standing Balance Dynamic Standing - Level of Assistance: 3: Mod assist  Home Assistive Devices/Equipment:  Home Assistive Devices/Equipment Home Assistive Devices/Equipment: Eyeglasses  Discharge Planning:  Discharge Planning Living Arrangements: Children Support Systems: Children;Family members;Friends/neighbors Do you have any problems obtaining your medications?: No Type of Residence: Private residence Home Care Services: No Patient expects to be discharged to:: home Expected Discharge Date:  (ELOS 2 weeks) Case Management Consult Needed: Yes (Comment) Patient states his stepson works 8 days on, then 6 days off. Another son works for himself and he feels family can initially provide 24/7 supervision if needed.  Prior Functional Levels:  Prior Functional Level Bed Mobility:  (Independent)     Previous Home Environment:  Previous Home Environment Living Arrangements: Children Support Systems: Children;Family members;Friends/neighbors Do you have any problems obtaining your medications?: No Type  of Residence: Private residence Home Care Services: No Patient expects to be discharged to:: home Expected Discharge Date:  (ELOS 2 weeks) Home Environment Number of Levels: two level with  basement Previous Home Environment Number of Steps: 2 steps Previous Home Environment Is Bedroom on Main Floor?: Yes Previous Home Environment Is Bathroom on Main Floor?: Yes  Discharge Living Setting:  Discharge Living Setting Plans for Discharge Living Setting: Lives with (comment) (son) Discharge Living Setting Number of Levels: two level with basement Discharge Living Setting Number of Steps: 2 steps Discharge Living Setting is Bedroom on Main Floor?: Yes Discharge Living Setting is Bathroom on Main Floor?: Yes  Social/Family/Support Systems:  Social/Family/Support Systems Patient Roles: Parent Contact Information: Alexis Goodell, son Anticipated Caregiver: sons Ability/Limitations of Caregiver: works as TEFL teacher. Another son works for [employer] (medical) Availability: 24/7 Discharge Plan Discussed with Primary Caregiver: Yes Is Caregiver In Agreement with Plan?: Yes Does Caregiver/Family have Issues with Lodging/Transportation while Pt is in Rehab?: No Step son is Rulon Sera who lives with pt. Primary contact is son, Demetrie Borge, cell is [phone].  Goals/Additional Needs:  Goals/Additional Needs Patient/Family Goal for Rehab:  (Mod I to supervision with P.T. and O.T.) Pt/Family Agrees to Admission and willing to participate: Yes Program Orientation Provided & Reviewed with Pt/Caregiver Including Roles  & Responsibilities: Yes  Preadmission Screen Completed By:  Clois Dupes, 10/05/2011 11:28 AM  Patient's condition:  Please see physician update to information in consult dated 10/03/2011.  Preadmission Screen Competed by:Ottie Glazier, RN, Time/Date,1142 on 10/05/11.  Discussed status with Dr. Riley Kill on 10/05/11 at 1142 and received telephone approval for admission today.  Admission Coordinator:  Clois Dupes, time 0981 Date 10/05/11.  Marland Kitchen

## 2011-10-04 NOTE — Op Note (Signed)
TEE  Indication - Stroke  No embolic source identified.  No LAA Trace AI, MR Normal EF Negative bubble study.   Reassuring.

## 2011-10-04 NOTE — H&P (View-Only) (Signed)
Subjective: Patient is alert and cooperative. The patient reports some left-sided headache, and still notes ocillopsia. The visual complaints have resolved and some underlying nausea and discomfort. Patient is now eating and drinking fairly well. Overall, the patient feels slightly better than he did yesterday.  Objective: Vital signs in last 24 hours: Temp:  [98 F (36.7 C)-98.6 F (37 C)] 98 F (36.7 C) (12/09 0958) Pulse Rate:  [70-90] 90  (12/09 0958) Resp:  [16-18] 16  (12/09 0958) BP: (131-169)/(81-90) 131/81 mmHg (12/09 0958) SpO2:  [98 %-100 %] 98 % (12/09 0958) Weight change:  Last BM Date: 09/27/11  Intake/Output from previous day: 12/08 0701 - 12/09 0700 In: -  Out: 1225 [Urine:1225] Intake/Output this shift:    @ROS@  The patient reports a left-sided headache. The patient reports dizziness.  The patient indicates that his eyes tend to want to go to the left. The patient reports oscillopsia.  The patient denies short of breath, chest pain.  The patient denies nausea or vomiting, or abdominal pain.  The patient has not had any new numbness or weakness of the face, arms, or legs.  The patient reports some double vision.     Physical Examination:  Mental Status Exam: The patient is alert and cooperative at the time of the examination. The patient is oriented x3.  Motor Exam: The patient moves all 4 extremities, and has good strength bilaterally.  Cerebellar Exam: The patient has good finger-nose-finger and heel-to-shin bilaterally. Gait was not tested.  Deep tendon reflexes: Deep tendon reflexes are symmetric throughout. Toes are downgoing bilaterally.  Cranial Nerve Exam: Facial symmetry is present. Extraocular movements are full. Nystagmus is seen with lateral gaze bilaterally, fast phase to the right.  Visual fields are full. Speech is well enunciated, no aphasia or dysarthria is noted.  Respiratory examination is clear.  Cardiovascular examination  reveals a regular rate and rhythm, no obvious murmurs or rubs are noted.  Extremities are without significant edema.  Abdominal examination reveals positive bowel sounds, no tenderness noted.   Lab Results:  Basename 09/30/11 1752  WBC 6.1  HGB 15.0  HCT 41.3  PLT 249   BMET  Basename 09/30/11 1752  NA 132*  K 4.3  CL 95*  CO2 29  GLUCOSE 116*  BUN 7  CREATININE 0.94  CALCIUM 9.7    Studies/Results: Ct Angio Head W/cm &/or Wo Cm  10/01/2011  *RADIOLOGY REPORT*  Clinical Data:  Left sided headache and blurred vision.  Off balance.  Evaluate posterior circulation for stroke.  CT ANGIOGRAPHY HEAD  Technique:  Multidetector CT imaging of the head was performed using the standard protocol during bolus administration of intravenous contrast.  Multiplanar CT image reconstructions including MIPs were obtained to evaluate the vascular anatomy.  Contrast: 75mL OMNIPAQUE IOHEXOL 350 MG/ML IV SOLN  Comparison:  None available.  Findings:  The noncontrast images of the head demonstrate no acute infarct, hemorrhage, mass lesion.  The source images reveal no acute infarct. The ventricles are of normal size.  No significant extra-axial fluid collection is present.  The more delayed postcontrast images demonstrate no areas of pathologic enhancement.  Minimal mucosal thickening is present within the maxillary sinuses bilaterally.  The remaining paranasal sinuses and mastoid air cells are clear.  The internal carotid arteries demonstrate atherosclerotic calcifications within the cavernous segments bilaterally.  There is no significant stenosis relative to the distal vessels.  The A1 and M1 segments are normal.  The anterior communicating artery is patent.  An   azygos A2 segment is noted.  The MCA bifurcations are normal bilaterally.  Mild segmental irregularity is noted within distal MCA branch vessels.  The ACA branch vessels are unremarkable.  The left vertebral artery is the dominant vessel.  The right  vertebral artery terminates at the PICA.  No definite left PICA is visualized.  A prominent left AICA is noted.  The basilar artery is normal.  The right posterior cerebral artery originates from basilar tip.  The left posterior cerebral artery is of fetal type. PCA branch vessels are normal bilaterally.   Review of the MIP images confirms the above findings.  IMPRESSION:  1.  Normal variant MRA circle of Daine Gunther without significant proximal stenosis, aneurysm, or branch vessel occlusion. 2.  Minimal small vessel disease. 3.  No evidence for posterior circulation stenosis, occlusion, or infarct.  Original Report Authenticated By: CHRISTOPHER W. MATTERN, M.D.   Dg Chest 2 View  10/01/2011  *RADIOLOGY REPORT*  Clinical Data: Stroke  CHEST - 2 VIEW  Comparison: 08/25/2010  Findings: The heart size and pulmonary vascularity are normal. The lungs appear clear and expanded without focal air space disease or consolidation. No blunting of the costophrenic angles.  No significant change since previous study.  IMPRESSION: No evidence of active pulmonary disease.  Original Report Authenticated By: WILLIAM R. STEVENS, M.D.   Dg Swallowing Func-no Report  10/01/2011  CLINICAL DATA: dysphagia   FLUOROSCOPY FOR SWALLOWING FUNCTION STUDY:  Fluoroscopy was provided for swallowing function study, which was  administered by a speech pathologist.  Final results and recommendations  from this study are contained within the speech pathology report.      Medications:  Scheduled:   . aspirin  325 mg Oral Daily  . lamoTRIgine  100 mg Oral BID   Continuous:  PRN:acetaminophen, meclizine, ondansetron, senna-docusate  Assessment/Plan: 1. 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 The patient likely will need inpatient rehabilitation. Patient was on aspirin at the time of the stroke. The patient is now on a dysphasia diet with nectar thick liquids. Patient is not  having vomiting, and is able to eat. The CT angiogram has been done, but the formal report is normal. I will obtain a rehabilitation consult. Physical, occupational, and speech therapy are working with the patient.       LOS: 2 days   Zalayah Pizzuto KEITH 10/02/2011, 11:28 AM   

## 2011-10-04 NOTE — Interval H&P Note (Signed)
History and Physical Interval Note:  10/04/2011 3:21 PM  Connor [name]  has presented today for surgery, with the diagnosis of * No pre-op diagnosis entered *  The various methods of treatment have been discussed with the patient and family. After consideration of risks, benefits and other options for treatment, the patient has consented to  Procedure(s): TRANSESOPHAGEAL ECHOCARDIOGRAM (TEE) as a surgical intervention .  The patients' history has been reviewed, patient examined, no change in status, stable for surgery.  I have reviewed the patients' chart and labs.  Questions were answered to the patient's satisfaction.     Faylinn Schwenn

## 2011-10-04 NOTE — Progress Notes (Signed)
I await insurance approval to admit patient to inpt rehab and medical work up completion. Pager [phone]

## 2011-10-04 NOTE — Progress Notes (Signed)
Stroke Team Progress Note  SUBJECTIVE  Connor [name] is a 65 y.o. male whose wife is at the bedside. Overall he feels his condition is stable. He had a seizure yesterday; he has a history of seizures. Wife states he typically has seizures when he doesn't take his medicines on schedule. He takes lamictal 100 mg twice daily and had been seizure free for years until yesterday.  OBJECTIVE Most recent Vital Signs: Temp: 97.7 F (36.5 C) (12/11 1044) BP: 146/86 mmHg (12/11 1044) Pulse Rate: 77  (12/11 1044) Respiratory Rate: 20 O2 Saturdation: 94%  Intake/Output from previous day: 12/10 0701 - 12/11 0700 In: 840 [P.O.:840] Out: 725 [Urine:725]  IV Fluid Intake:    Diet:  NPO for TEE  Activity:  Up with assistance  DVT Prophylaxis:  SCDs   Studies: CBC    Component Value Date/Time   WBC 6.1 09/30/2011 1752   RBC 4.80 09/30/2011 1752   HGB 15.0 09/30/2011 1752   HCT 41.3 09/30/2011 1752   PLT 249 09/30/2011 1752   MCV 86.0 09/30/2011 1752   MCH 31.3 09/30/2011 1752   MCHC 36.3* 09/30/2011 1752   RDW 12.6 09/30/2011 1752   LYMPHSABS 1.0 09/30/2011 1752   MONOABS 0.8 09/30/2011 1752   EOSABS 0.3 09/30/2011 1752   BASOSABS 0.0 09/30/2011 1752   CMP    Component Value Date/Time   NA 132* 09/30/2011 1752   K 4.3 09/30/2011 1752   CL 95* 09/30/2011 1752   CO2 29 09/30/2011 1752   GLUCOSE 116* 09/30/2011 1752   BUN 7 09/30/2011 1752   CREATININE 0.94 09/30/2011 1752   CALCIUM 9.7 09/30/2011 1752   PROT 7.2 09/30/2011 1752   ALBUMIN 3.6 09/30/2011 1752   AST 18 09/30/2011 1752   ALT 13 09/30/2011 1752   ALKPHOS 75 09/30/2011 1752   BILITOT 0.4 09/30/2011 1752   GFRNONAA 87* 09/30/2011 1752   GFRAA >90 09/30/2011 1752   COAGS Lab Results  Component Value Date   INR 1.04 08/25/2010   Lipid Panel    Component Value Date/Time   CHOL 192 10/01/2011 0500   TRIG 130 10/01/2011 0500   HDL 54 10/01/2011 0500   CHOLHDL 3.6 10/01/2011 0500   VLDL 26 10/01/2011 0500   LDLCALC 112* 10/01/2011 0500    HgbA1C  Lab Results  Component Value Date   HGBA1C 5.5 10/01/2011   Urine Drug Screen  No results found for this basename: labopia, cocainscrnur, labbenz, amphetmu, thcu, labbarb    Alcohol Level No results found for this basename: eth   CT of the brain unremarkable  CT angio head  1. Normal variant MRA circle of Willis without significant  proximal stenosis, aneurysm, or branch vessel occlusion.  2. Minimal small vessel disease.  3. No evidence for posterior circulation stenosis, occlusion, or infarct.   MRI of the brain   Acute infarctions affect the left lateral medulla and right medial inferior cerebellum.  See comments above.   2D Echocardiogram  EF 55-60% with no source of embolus.   Carotid Doppler  No internal carotid artery stenosis bilaterally. Vertebrals with antegrade flow bilaterally.  CXR No evidence of active pulmonary disease.  EKG Not ordered here  Physical Exam  pleasant elderly Caucasian male in no distress. Afebrile. Neck is supple without bruit.Cardiac exam normal heart sounds.Lungs clear to auscultation.  Neurological exam Awake alert oriented x 3. Speech and language appear normal. eye movements are full but saccadic dysmetria to right with nystagmus. 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 No palatal weakness. Good gag. Decrease sensation left face and right body. No drift but mild left dysmetria and mild truncal ataxia. Gait deferred  ASSESSMENT [name] is a 65 y.o. 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 On aspirin 325 mg orally every day for secondary stroke prevention.   Seizure, chronic. Seizure yesterday likely secondary to stress of being in the hospital. Has been on standing Lamictal dose.  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 TEE today. Increase Lamictal to 200 mg at bedtime and 100 mg every morning. Rehabilitation continues to follow. Likely plan for transfer there tomorrow.D/W patient and wife.  Joaquin Music, ANP-BC, GNP-BC [facility] Pager: [phone] 10/04/2011 1:11 PM  Dr. Delia Heady, Stroke Center Medical Director, has personally reviewed chart, pertinent data, examined the patient and developed the plan of care.

## 2011-10-05 ENCOUNTER — Encounter (HOSPITAL_COMMUNITY): Payer: Self-pay | Admitting: *Deleted

## 2011-10-05 ENCOUNTER — Inpatient Hospital Stay (HOSPITAL_COMMUNITY)
Admission: RE | Admit: 2011-10-05 | Discharge: 2011-10-17 | DRG: 945 | Disposition: A | Discharge status: Home Health | Payer: Medicare Other | Source: Ambulatory Visit | Attending: Physical Medicine & Rehabilitation | Admitting: Physical Medicine & Rehabilitation

## 2011-10-05 ENCOUNTER — Encounter (HOSPITAL_COMMUNITY): Payer: Self-pay | Admitting: Cardiology

## 2011-10-05 DIAGNOSIS — R51 Headache: Secondary | ICD-10-CM | POA: Diagnosis present

## 2011-10-05 DIAGNOSIS — Z79899 Other long term (current) drug therapy: Secondary | ICD-10-CM

## 2011-10-05 DIAGNOSIS — E785 Hyperlipidemia, unspecified: Secondary | ICD-10-CM | POA: Diagnosis present

## 2011-10-05 DIAGNOSIS — R1310 Dysphagia, unspecified: Secondary | ICD-10-CM

## 2011-10-05 DIAGNOSIS — F1020 Alcohol dependence, uncomplicated: Secondary | ICD-10-CM

## 2011-10-05 DIAGNOSIS — I639 Cerebral infarction, unspecified: Secondary | ICD-10-CM

## 2011-10-05 DIAGNOSIS — G40909 Epilepsy, unspecified, not intractable, without status epilepticus: Secondary | ICD-10-CM

## 2011-10-05 DIAGNOSIS — Z5189 Encounter for other specified aftercare: Principal | ICD-10-CM

## 2011-10-05 DIAGNOSIS — Z96659 Presence of unspecified artificial knee joint: Secondary | ICD-10-CM

## 2011-10-05 DIAGNOSIS — I129 Hypertensive chronic kidney disease with stage 1 through stage 4 chronic kidney disease, or unspecified chronic kidney disease: Secondary | ICD-10-CM

## 2011-10-05 DIAGNOSIS — C140 Malignant neoplasm of pharynx, unspecified: Secondary | ICD-10-CM

## 2011-10-05 DIAGNOSIS — N189 Chronic kidney disease, unspecified: Secondary | ICD-10-CM

## 2011-10-05 DIAGNOSIS — I6340 Cerebral infarction due to embolism of unspecified cerebral artery: Secondary | ICD-10-CM

## 2011-10-05 DIAGNOSIS — R112 Nausea with vomiting, unspecified: Secondary | ICD-10-CM | POA: Diagnosis present

## 2011-10-05 DIAGNOSIS — R519 Headache, unspecified: Secondary | ICD-10-CM | POA: Diagnosis present

## 2011-10-05 DIAGNOSIS — R279 Unspecified lack of coordination: Secondary | ICD-10-CM

## 2011-10-05 DIAGNOSIS — Z8673 Personal history of transient ischemic attack (TIA), and cerebral infarction without residual deficits: Secondary | ICD-10-CM

## 2011-10-05 DIAGNOSIS — I6330 Cerebral infarction due to thrombosis of unspecified cerebral artery: Secondary | ICD-10-CM

## 2011-10-05 DIAGNOSIS — Z85528 Personal history of other malignant neoplasm of kidney: Secondary | ICD-10-CM

## 2011-10-05 DIAGNOSIS — R4789 Other speech disturbances: Secondary | ICD-10-CM

## 2011-10-05 DIAGNOSIS — Z7982 Long term (current) use of aspirin: Secondary | ICD-10-CM

## 2011-10-05 DIAGNOSIS — H532 Diplopia: Secondary | ICD-10-CM

## 2011-10-05 DIAGNOSIS — G902 Horner's syndrome: Secondary | ICD-10-CM | POA: Diagnosis present

## 2011-10-05 DIAGNOSIS — R29898 Other symptoms and signs involving the musculoskeletal system: Secondary | ICD-10-CM

## 2011-10-05 MED ORDER — SODIUM CHLORIDE 0.45 % IV SOLN: INTRAVENOUS | Status: DC

## 2011-10-05 MED ORDER — POLYETHYLENE GLYCOL 3350 17 G PO PACK
17.0000 g | PACK | Freq: Every day | ORAL | Status: DC | PRN
  Filled 2011-10-05: qty 1

## 2011-10-05 MED ORDER — PROMETHAZINE HCL 12.5 MG PO TABS: 12.5000 mg | ORAL_TABLET | Freq: Four times a day (QID) | ORAL | Status: DC | PRN

## 2011-10-05 MED ORDER — SODIUM CHLORIDE 0.9 % IJ SOLN
3.0000 mL | Freq: Two times a day (BID) | INTRAMUSCULAR | Status: DC
  Administered 2011-10-05: 3 mL via INTRAVENOUS

## 2011-10-05 MED ORDER — PROMETHAZINE HCL 25 MG/ML IJ SOLN: 12.5000 mg | Freq: Four times a day (QID) | INTRAMUSCULAR | Status: DC | PRN

## 2011-10-05 MED ORDER — ASPIRIN 325 MG PO TABS
325.0000 mg | ORAL_TABLET | Freq: Every day | ORAL | Status: DC
  Administered 2011-10-06 – 2011-10-17 (×12): 325 mg via ORAL
  Filled 2011-10-05 (×13): qty 1

## 2011-10-05 MED ORDER — MECLIZINE HCL 25 MG PO TABS
50.0000 mg | ORAL_TABLET | Freq: Three times a day (TID) | ORAL | Status: DC | PRN
  Filled 2011-10-05: qty 2

## 2011-10-05 MED ORDER — PROMETHAZINE HCL 12.5 MG RE SUPP: 12.5000 mg | Freq: Four times a day (QID) | RECTAL | Status: DC | PRN

## 2011-10-05 MED ORDER — LAMOTRIGINE 100 MG PO TABS
100.0000 mg | ORAL_TABLET | Freq: Every day | ORAL | Status: DC
  Administered 2011-10-06 – 2011-10-17 (×12): 100 mg via ORAL
  Filled 2011-10-05 (×13): qty 1

## 2011-10-05 MED ORDER — ACETAMINOPHEN 325 MG PO TABS
325.0000 mg | ORAL_TABLET | ORAL | Status: DC | PRN
  Administered 2011-10-07 – 2011-10-17 (×27): 650 mg via ORAL
  Filled 2011-10-05 (×2): qty 2
  Filled 2011-10-05: qty 1
  Filled 2011-10-05 (×25): qty 2

## 2011-10-05 MED ORDER — SODIUM CHLORIDE 0.9 % IJ SOLN: 3.0000 mL | INTRAMUSCULAR | Status: DC | PRN

## 2011-10-05 MED ORDER — SODIUM CHLORIDE 0.9 % IV SOLN: 250.0000 mL | INTRAVENOUS | Status: DC | PRN

## 2011-10-05 MED ORDER — LAMOTRIGINE 200 MG PO TABS
200.0000 mg | ORAL_TABLET | Freq: Every day | ORAL | Status: DC
  Administered 2011-10-05 – 2011-10-16 (×12): 200 mg via ORAL
  Filled 2011-10-05 (×13): qty 1

## 2011-10-05 MED ORDER — SORBITOL 70 % SOLN: 30.0000 mL | Freq: Every day | Status: DC | PRN

## 2011-10-05 NOTE — Progress Notes (Signed)
Patient ID: Connor [name], male   DOB: 1945/11/10, 65 y.o.   MRN: [id number] Overall Plan of Care [facility]) Patient Details Name: Connor [name] MRN: [id number] DOB: September 28, 1946  Diagnosis:  Rehabilitation for Stroke  Primary Diagnosis:    CVA (cerebral infarction) Co-morbidities: Ataxia, diplopia, dysphagia  Functional Problem List  Patient demonstrates impairments in the following areas: Balance, Bladder, Bowel, Endurance, Motor, Pain, Perception, Safety, Sensory , Skin Integrity and Vision  Basic ADL's: grooming, bathing, dressing and toileting Advanced ADL's: none  Transfers:  bed mobility, bed to chair, toilet, tub/shower, car, furniture and floor  Locomotion:  ambulation, wheelchair mobility and stairs  Additional Impairments:  Functional use of upper extremity and Discharge Disposition  Anticipated Outcomes Item Anticipated Outcome  Eating/Swallowing  Modified independent  Basic self-care    Tolieting    Bowel/Bladder  Min assist  Transfers  Supervision  Locomotion Min assist gait, modified independent W/C mobility  Communication    Cognition    Pain  Pain less than 2  Safety/Judgment  Free from falls/injury  Other  No skin breakdown   Therapy Plan:  SLP 1-2 times a day for 30-60 minutes 5 out of 7 days a week       Team Interventions: Item RN PT OT SLP SW TR Other  Self Care/Advanced ADL Retraining  x x      Neuromuscular Re-Education  x x      Therapeutic Activities  x x      UE/LE Strength Training/ROM  x x      UE/LE Coordination Activities  x x      Visual/Perceptual Remediation/Compensation  x x      DME/Adaptive Equipment Instruction  x x      Therapeutic Exercise  x x x     Balance/Vestibular Training  x x      Patient/Family Education  x x x     Cognitive Remediation/Compensation  x x      Functional Mobility Training  x x      Ambulation/Gait Training  x       Museum/gallery curator  x       Wheelchair Propulsion/Positioning  x         Functional Tourist information centre manager Reintegration  x       Dysphagia/Aspiration Printmaker     x    Speech/Language Facilitation         Bladder Management x        Bowel Management x        Disease Management/Prevention x        Pain Management x        Medication Management x        Skin Care/Wound Management x        Splinting/Orthotics         Discharge Planning     X    Psychosocial Support     X                       Team Discharge Planning: Destination:  Home Projected Follow-up:  OTPT, TBD HH vs Outpatient, SLP none Projected Equipment Needs:  Biomedical engineer involved in discharge planning:  Yes  MD ELOS: 10 days Medical Rehab Prognosis:  Excellent Assessment: PT and OT 1-2 hr /day 5 day /week SLP as above

## 2011-10-05 NOTE — Discharge Summary (Signed)
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 Head and neck cancer ~ 2009    S/P radiation & chem  . Stroke   . Hypertension   . Seizures     "the bad kind;bite tongue;see Dr.Love; last 07/2011 & 08/2011"  . Chronic kidney disease   . Cancer     h/o neck  . Kidney carcinoma     h/o  . Arthritis    Past Surgical History  Procedure Date  . Nephrectomy 1990's    left  . Joint replacement   . Total knee arthroplasty 2011    left  . Hydrocelectomy 11/2000    left  . Tee without cardioversion 10/04/2011    Procedure: TRANSESOPHAGEAL ECHOCARDIOGRAM (TEE);  Surgeon: Donato Schultz, MD;  Location: [facility];  Service: Cardiovascular;  Laterality: N/A;    BMI  Body mass index is 26.50 kg/(m^2).   Continue current medications on Rehab   Significant Diagnostic Studies:  CT of the brain unremarkable  CT angio head  1. Normal variant MRA circle of Willis without significant  proximal stenosis, aneurysm, or branch vessel occlusion.  2. Minimal small vessel disease.  3. No evidence for posterior circulation stenosis, occlusion, or infarct.  MRI of the brain No acute stroke  2D Echocardiogram EF 55-60% with no source of embolus.  Carotid Doppler No internal carotid artery stenosis bilaterally. Vertebrals with antegrade flow bilaterally.  CXR No evidence of  active pulmonary disease.  EKG Not ordered here TEE - no source embolus, no PFO  Laboratory Studies CBC    Component Value Date/Time   WBC 6.1 09/30/2011 1752   RBC 4.80 09/30/2011 1752   HGB 15.0 09/30/2011 1752   HCT 41.3 09/30/2011 1752   PLT 249 09/30/2011 1752   MCV 86.0 09/30/2011 1752   MCH 31.3 09/30/2011 1752   MCHC 36.3* 09/30/2011 1752   RDW 12.6 09/30/2011 1752   LYMPHSABS 1.0 09/30/2011 1752   MONOABS 0.8 09/30/2011 1752   EOSABS 0.3 09/30/2011 1752   BASOSABS 0.0 09/30/2011 1752   CMP    Component Value Date/Time   NA 132* 09/30/2011 1752   K 4.3 09/30/2011 1752   CL 95* 09/30/2011 1752   CO2 29 09/30/2011 1752   GLUCOSE 116* 09/30/2011 1752   BUN 7 09/30/2011 1752   CREATININE 0.94 09/30/2011 1752   CALCIUM 9.7 09/30/2011 1752   PROT 7.2 09/30/2011 1752   ALBUMIN 3.6 09/30/2011 1752   AST 18 09/30/2011 1752   ALT 13 09/30/2011 1752   ALKPHOS 75 09/30/2011 1752   BILITOT 0.4 09/30/2011 1752   GFRNONAA 87* 09/30/2011 1752   GFRAA >90 09/30/2011 1752   COAGS Lab Results  Component Value Date   INR 1.04 08/25/2010   Lipid Panel    Component Value Date/Time   CHOL 192 10/01/2011 0500   TRIG 130 10/01/2011 0500   HDL 54  10/01/2011 0500   CHOLHDL 3.6 10/01/2011 0500   VLDL 26 10/01/2011 0500   LDLCALC 112* 10/01/2011 0500   HgbA1C  Lab Results  Component Value Date   HGBA1C 5.5 10/01/2011   Urine Drug Screen  No results found for this basename: labopia,  cocainscrnur,  labbenz,  amphetmu,  thcu,  labbarb    Alcohol Level No results found for this basename: eth   History of Present Illness  Connor [name] is a 65 y.o. male with a past medical history of seizure disorder and chronic alcoholism who is a patient of Dr. Sandria Manly since 1994. Patient also has history of throat cancer and underwent radiation therapy and chemotherapy at [facility]. His most recent CT of the neck with contrast on 07/12/2011 showed no evidence of recurrent disease. On wednesday 09/28/2011 he was  eating breakfast with his friends in a restaurant and noted the onset of left-sided headache with numbness in the left for head extending down the left side of his face and then involving his entire body with a tingling sensation or numbness, worse on the left side of his body than the right. He became weak in both arms and both legs and slumped in his chair. There was no chest pain or out the patient all her seizure. 911 was called and he was seen at hospital emergency room. CT scan of the brain without contrast was unremarkable and MRI study of the brain with and without contrast which was reviewed by Dr. Benard Rink was negative for acute stroke on December 5. He was sent home after the evaluation but noted that he was falling on his left side. He also noted double vision and oscillopsia. He did not have hiccups. He also noted difficulty voiding. His stroke most likely occurred 09/28/11, so was beyond the window for tpa. He was admitted to the hospital for further evaluation.  Hospital Course   The MRI revealed a left mid pontine/medullary junction brainstem infarct with associated Horner's syndrome. The patient also had a right inferior cerebellar infarct that was associated with left sided dysmetria and mild truncal ataxia. The infarcts were felt to be secondary to embolic source. No embolic source was found during hospitalization, including a negative TEE performed by Dr. Anne Fu. Patient has known vascular risk factors of hypertension and hyperlipidemia. He will be started on a statin at discharge.  Patient has a history of seizures. He is on Lamictal 100 mg twice a day home. During hospitalization patient had a seizure 10/04/2011. He was loaded with Dilantin at that time. Patient and his wife states his seizures are well controlled at home as long as he takes his medicine on a regular basis. We feel the  Breakthrough seizure is secondary to stress of hospitalization. We'll stop Dilantin and increase Lamictal  to 150 mg at bedtime continuing with 100 mg in the morning.  PT OT speech therapy I will evaluate the patient. He would be a good inpatient rehabilitation candidate. The patient has family support at home. Arrangements are in place and patient will be transferred to rehabilitation for ongoing therapies.  Discharge Exam  Blood pressure 129/89, pulse 100, temperature 97.5 F (36.4 C), temperature source Axillary, resp. rate 20, height 5\' 11"  (1.803 m), weight 86.183 kg (190 lb), SpO2 96.00%. pleasant elderly Caucasian male in no distress. Afebrile. Neck is supple without bruit.Cardiac exam normal heart sounds.Lungs clear to auscultation.  Neurological exam Awake alert oriented x 3. Speech and language appear normal. eye movements are  full but saccadic dysmetria to right with nystagmus. 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 No palatal weakness. Good gag. Decrease sensation left face and right body. No drift but mild left dysmetria and mild truncal ataxia. Gait deferred  Discharge Diet  Dysphagia 2 thin liquids  Discharge Plan - Disposition:  Transfer to [facility] for ongoing PT, OT and ST - aspirin 325 mg orally every day for secondary stroke prevention. - start Zocor 20 mg daily - Ongoing risk factor control by Primary Care Physician. - Risk factor recommendations:  Hypertension target range 130-140/70-80 Lipid range - LDL < 100 and checked every 6 months, fasting - Follow-up GATES,ROBERT NEVILL, MD, MD in 1 month. - Follow-up with Dr. Delia Heady in 2 months.  Signed Annie Main, AVNP, ANP-BC, [facility] Nurse Practitioner 10/05/2011, 2:46 PM  Dr. Delia Heady, Stroke Center Medical Director, has personally reviewed chart, pertinent data, examined the patient and developed the plan of care.

## 2011-10-05 NOTE — Progress Notes (Signed)
Utilization review completed. Marygrace Sandoval, RN, BSN. 10/05/11 

## 2011-10-05 NOTE — Progress Notes (Signed)
Physical Therapy Treatment Patient Details Name: FREDRIC SLABACH MRN: [id number] DOB: 08-17-1946 Today's Date: 10/05/2011  PT Assessment/Plan  PT - Assessment/Plan Comments on Treatment Session: Pt states eye sight is improving but c/o double vision with gaze stabilization exercises with horizontal head turns.  Educated pt on compensatory strategies to help minimize double vision.   PT Plan: Discharge plan remains appropriate PT Frequency: Min 4X/week Follow Up Recommendations: Inpatient Rehab Equipment Recommended: Defer to next venue PT Goals  Acute Rehab PT Goals PT Goal: Sit to Stand - Progress: Progressing toward goal PT Goal: Stand to Sit - Progress: Progressing toward goal PT Goal: Ambulate - Progress: Progressing toward goal  PT Treatment Precautions/Restrictions  Precautions Precautions: Fall Precaution Comments: Leans to Left Required Braces or Orthoses: No Restrictions Weight Bearing Restrictions: No Mobility (including Balance) Bed Mobility Bed Mobility: No Transfers Sit to Stand: Other (comment) (Min Guard (A)) Sit to Stand Details (indicate cue type and reason): Min Guard (A) when using bilateral UE's to push up from recliner but Min (A) with unilateral UE use.  Performed sit<>stand 5x's with minimal use of UE's to focus on strengthening & then 3x's with encouragement of UE use to increase safety.   Stand to Sit: 4: Min assist Stand to Sit Details: Increased Min (A) to control descent after ambulating but then required decreased (A) after seated rest break & then performing sit<>stands.   Ambulation/Gait Ambulation/Gait: Yes Ambulation/Gait Assistance: 4: Min assist;3: Mod assist Ambulation/Gait Assistance Details (indicate cue type and reason): Min (A) initially for balance & safety due to left lateral lean but increased lateral lean to left as distance increased & as LLE fatigued- pt with increased difficulty with coordination & midline posture.   Ambulation  Distance (Feet): 70 Feet Assistive device: Rolling walker Gait Pattern: Step-through pattern;Lateral trunk lean to left (narrow BOS)  Posture/Postural Control Postural Limitations: Left lateral lean- worsens with increased ambulation distance.   Static Standing Balance Static Standing - Balance Support: Bilateral upper extremity supported (performed gaze stabilization activities.  ) Static Standing - Comment/# of Minutes: 5 minutes.  Exercise  General Exercises - Lower Extremity Long Arc Quad: AROM;Strengthening;10 reps;Both;Seated Long Arc Quad Limitations: cues for smooth controlled motion.  Jerkiness with eccentric phase.   Hip Flexion/Marching: AROM;Strengthening;10 reps;Both;Seated;Standing Heel Raises: AROM;Strengthening;10 reps;Both;Seated End of Session PT - End of Session Equipment Utilized During Treatment: Gait belt Activity Tolerance: Patient tolerated treatment well Patient left: in chair;with call bell in reach General Behavior During Session: [facility] for tasks performed Cognition: [facility] for tasks performed  Lara Mulch 10/05/2011, 12:13 PM [phone]

## 2011-10-05 NOTE — Progress Notes (Signed)
Pt to room from 3027, oriented to room and plan of care initiated,

## 2011-10-05 NOTE — Progress Notes (Signed)
Insurance has approved and bed is available on CIR today. Patient aware and in agreement. We will plan admit and Annie Main, [facility] is aware. Pager [phone].

## 2011-10-05 NOTE — Progress Notes (Signed)
Occupational Therapy Evaluation Patient Details Name: Connor [name] MRN: [id number] DOB: [birth year] Today's Date: 10/05/2011  OT Assessment/Plan OT Assessment/Plan Comments on Treatment Session: Addressed sitting balance and trunk control through reach tasks sitting EOB. OT Plan: Discharge plan remains appropriate OT Frequency: Min 2X/week Follow Up Recommendations: Inpatient Rehab Equipment Recommended: Defer to next venue OT Goals Acute Rehab OT Goals OT Goal Formulation: With patient Time For Goal Achievement: 7 days ADL Goals Pt Will Perform Grooming: Standing at sink ADL Goal: Grooming - Progress: Progressing toward goals ADL Goal: Upper Body Bathing - Progress: Not addressed Pt Will Perform Lower Body Bathing: with min assist;Sit to stand in shower ADL Goal: Lower Body Bathing - Progress: Not addressed Pt Will Perform Upper Body Dressing: with supervision;Sitting, bed ADL Goal: Upper Body Dressing - Progress: Not addressed Pt Will Perform Lower Body Dressing: with min assist;Sit to stand from bed;Unsupported ADL Goal: Lower Body Dressing - Progress: Not addressed Pt Will Transfer to Toilet: Ambulation;with DME ADL Goal: Toilet Transfer - Progress: Progressing toward goals Pt Will Perform Tub/Shower Transfer: with min assist;Ambulation;with DME ADL Goal: Tub/Shower Transfer - Progress: Not addressed  OT Treatment Precautions/Restrictions  Precautions Precautions: Fall Precaution Comments: Leans to Left Required Braces or Orthoses: No Restrictions Weight Bearing Restrictions: No   ADL ADL Grooming: Performed;Set up;Teeth care;Brushing hair Where Assessed - Grooming: Sitting, chair Toilet Transfer: Simulated;Minimal assistance Toilet Transfer Details (indicate cue type and reason):  (bed to chair) Toilet Transfer Method: Ambulating Equipment Used: Rolling walker ADL Comments: Pt with knowledge of tendency to lean to L and inability to detect hot and cold with  LUE.  Difficulty concentrating on posture while involved in ADL. Mobility  Bed Mobility Bed Mobility: supervision supine to sit Transfers Sit to Stand: Min assist Stand to Sit: 4: Min assist End of Session OT - End of Session Equipment Utilized During Treatment: Gait belt Activity Tolerance: Patient tolerated treatment well Patient left: in chair;with call bell in reach General Behavior During Session: [facility] for tasks performed Cognition: [facility] for tasks performed  Evern Bio  10/05/2011, 11:58 AM [phone]

## 2011-10-05 NOTE — Progress Notes (Signed)
Stroke Team Progress Note  SUBJECTIVE [name] is a 65 y.o. male whose symptoms are rapidly improving. his friend is at the bedside. He feels his vision is much improved from yesterday and his L ptosis is improved.  OBJECTIVE Most recent Vital Signs: Temp: 97.5 F (36.4 C) (12/12 1017) Temp src: Axillary (12/12 1017) BP: 129/89 mmHg (12/12 1017) Pulse Rate: 100  (12/12 1017) Respiratory Rate: 20 O2 Saturdation: 96%  Intake/Output from previous day: 12/11 0701 - 12/12 0700 In: 120 [P.O.:120] Out: 700 [Urine:700]  IV Fluid Intake     . sodium chloride    . DISCONTD: sodium chloride     Diet  Dysphagia thin liquids Activity  Up with assistance DVT Prophylaxis  SCDs   Studies TEE - no source embolus, no PFO  Physical Exam  pleasant elderly Caucasian male in no distress. Afebrile. Neck is supple without bruit.Cardiac exam normal heart sounds.Lungs clear to auscultation.  Neurological exam Awake alert oriented x 3. Speech and language appear normal. eye movements are full but saccadic dysmetria to right with nystagmus. 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 No palatal weakness. Good gag. Decrease sensation left face and right body. No drift but mild left dysmetria and mild truncal ataxia. Gait deferred  ASSESSMENT [name] is a 65 y.o. 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 On aspirin 325 mg orally every day for secondary stroke prevention.   Seizure, chronic. Seizure yesterday likely secondary to stress of being in the hospital.  Lamictal dose increased yesterday with no further seizures..   Stroke risk factors: hypertension  Hospital day # 5  TREATMENT/PLAN Continue aspirin 325 mg orally every day for secondary stroke prevention. Transfer to rehabilitation unit today for ongoing PT, OT and  speech therapy.  Joaquin Music, ANP-BC, GNP-BC [facility] Pager: [phone] 10/05/2011 12:06 PM  Dr. Delia Heady, Stroke Center Medical Director, has personally reviewed chart, pertinent data, examined the patient and developed the plan of care.

## 2011-10-05 NOTE — H&P (Signed)
Physical Medicine and Rehabilitation Admission H&P  Connor [name] is an 65 y.o. male.  No chief complaint on file.  :  HPI: 64 year old right-handed male with past medical history seizure disorder and chronic alcoholism as well as throat cancer with radiation therapy. Patient with noted left-sided weakness on December 5 while eating breakfast with friends. CT scan of the head as well as MRI of the brain with and without contrast were negative and patient was discharged home from the emergency department. He was admitted to the hospital on December 7 with falls as well as increased left-sided weakness as well as double vision. 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 CT angiogram of the head showed no evidence of posterior circulation stenosis or occlusion. TEE with normal ejection fraction and no emboli. Carotid Dopplers with no internal carotid artery stenosis reported. Maintained on aspirin therapy with full neurology workup ongoing. On December 10 patient with noted seizure lasting for minutes x1. Neurology services with followup. 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 His Lamictal that he was prior to admission was adjusted and no further seizure activity reported Physical therapy ongoing and requires moderate assist to ambulate 50 feet with rolling walker.MBS per SLP and placed on dys 2 thin liquids. Physical medicine and rehabilitation was consulted at the request of physical therapy to consider inpatient rehabilitation services  Review of Systems  Constitutional: Positive for malaise/fatigue. Negative for fever.  Eyes: Positive for blurred vision.  Respiratory: Negative for cough and shortness of breath.  Cardiovascular: Negative for chest pain.  Gastrointestinal: Negative for nausea.  Genitourinary: Negative for dysuria.  Musculoskeletal: Positive for falls. Negative for myalgias.  Skin: Negative.    Neurological: Positive for dizziness, seizures and headaches.  Psychiatric/Behavioral: Negative  Past Medical History   Diagnosis  Date   .  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  Head and neck cancer  ~ 2009     S/P radiation & chem   .  Stroke    .  Hypertension    .  Seizures      "the bad kind;bite tongue;see Dr.Love; last 07/2011 & 08/2011"   .  Chronic kidney disease    .  Cancer      h/o neck   .  Kidney carcinoma      h/o   .  Arthritis     Past Surgical History   Procedure  Date   .  Nephrectomy  1990's     left   .  Joint replacement    .  Total knee arthroplasty  2011     left   .  Hydrocelectomy  11/2000     left   .  Tee without cardioversion  10/04/2011     Procedure: TRANSESOPHAGEAL ECHOCARDIOGRAM (TEE); Surgeon: Donato Schultz, MD; Location: [facility] ENDOSCOPY; Service: Cardiovascular; Laterality: N/A;    History reviewed. No pertinent family history.  Social History: reports that he has never smoked. He has never used smokeless tobacco. He reports that he drinks about 12 ounces of alcohol per week. He reports that he does not use illicit drugs.  Allergies:  Allergies   Allergen  Reactions   .  Morphine And Related  Nausea And Vomiting    Medications Prior to Admission   Medication  Dose  Route  Frequency  Provider  Last Rate  Last Dose   .  0.45 % sodium chloride infusion   Intravenous  Continuous  Quintella Reichert, MD     .  0.9 % sodium chloride infusion   Intravenous  Once  Carmell Austria  250 mL/hr at 10/03/11 2204    .  0.9 % sodium chloride infusion  250 mL  Intravenous  PRN  Quintella Reichert, MD     .  0.9 % sodium chloride infusion  250 mL  Intravenous  PRN  Donato Schultz, MD  20 mL/hr at 10/04/11 1437  500 mL at 10/04/11 1437   .  acetaminophen (TYLENOL) tablet 650 mg  650 mg  Oral  Q6H PRN  Evie Lacks, MD   650 mg at 10/03/11 2205   .  aspirin tablet 325 mg  325 mg  Oral  Daily  Ankit Garg   325 mg at 10/04/11 0946   .   iohexol (OMNIPAQUE) 350 MG/ML injection 75 mL  75 mL  Intravenous  Once PRN  Medication Radiologist   75 mL at 10/01/11 0115   .  lamoTRIgine (LAMICTAL) tablet 100 mg  100 mg  Oral  Daily  Annie Main, NP     .  lamoTRIgine (LAMICTAL) tablet 200 mg  200 mg  Oral  QHS  Annie Main, NP   200 mg at 10/04/11 2246   .  LORazepam (ATIVAN) 2 MG/ML injection         .  meclizine (ANTIVERT) tablet 50 mg  50 mg  Oral  Q8H PRN  Ankit Garg   50 mg at 09/30/11 2133   .  ondansetron (ZOFRAN) tablet 4 mg  4 mg  Oral  Q6H PRN  Ankit Garg     .  phenytoin (DILANTIN) 1,500 mg in sodium chloride 0.9 % 250 mL IVPB  1,500 mg  Intravenous  Once  David Bezov   1,500 mg at 10/03/11 1943   .  senna-docusate (Senokot-S) tablet 1 tablet  1 tablet  Oral  QHS PRN  Ankit Garg   1 tablet at 10/03/11 2205   .  sodium chloride 0.9 % injection 3 mL  3 mL  Intravenous  Q12H  Traci R Turner, MD     .  sodium chloride 0.9 % injection 3 mL  3 mL  Intravenous  PRN  Quintella Reichert, MD     .  sodium chloride 0.9 % injection 3 mL  3 mL  Intravenous  Q12H  Donato Schultz, MD   3 mL at 10/04/11 2252   .  sodium chloride 0.9 % injection 3 mL  3 mL  Intravenous  PRN  Donato Schultz, MD     .  DISCONTD: 0.45 % sodium chloride infusion   Intravenous  Continuous  Donato Schultz, MD     .  DISCONTD: benzocaine (HURRICAINE) 20 % mouth spray 1 application  1 application  Mouth/Throat  PRN  Donato Schultz, MD     .  DISCONTD: butamben-tetracaine-benzocaine (CETACAINE) spray    PRN  Donato Schultz, MD   2 spray at 10/04/11 1524   .  DISCONTD: fentaNYL (SUBLIMAZE) injection 250 mcg  250 mcg  Intravenous  Once  Donato Schultz, MD     .  DISCONTD: fentaNYL (SUBLIMAZE) injection    PRN  Donato Schultz, MD   25 mcg at 10/04/11 1529   .  DISCONTD: lamoTRIgine (LAMICTAL) tablet 100 mg  100 mg  Oral  BID  Ankit Garg   100 mg at 10/04/11 0946   .  DISCONTD: lidocaine (XYLOCAINE) 2 % viscous mouth solution 20 mL  20 mL  Mouth/Throat  To Endo  Donato Schultz, MD     .  DISCONTD:  lidocaine (XYLOCAINE) 2 % viscous mouth solution    PRN  Donato Schultz, MD   10 mL at 10/04/11 1524   .  DISCONTD: LORazepam (ATIVAN) injection 2 mg  2 mg  Intravenous  Once  Carmell Austria     .  DISCONTD: midazolam (VERSED) 10 MG/2ML injection 10 mg  10 mg  Intravenous  Once  Donato Schultz, MD     .  DISCONTD: midazolam (VERSED) 10 MG/2ML injection    PRN  Donato Schultz, MD   2 mg at 10/04/11 1529    No current outpatient prescriptions on file as of 10/05/2011.    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 required cues to move legs to sit EOB  Sitting - Scoot to Edge of Bed: 5: Supervision  Sitting - Scoot to [place] of Bed Details (indicate cue type and reason): PT requiring hand hold assistance and trunk support to scoot to EOB  Transfers  Transfers: Yes  Sit to Stand: 4: Min assist;From bed  Sit to Stand  Details (indicate cue type and reason): cues for hand placement; (A) for balance due to left lateral lean. cues to ensure balance before initating ambulation.  Stand to Sit: 4: Min assist  Stand to Sit Details: (A) to control descent & locate hands on armrests of recliner. Cues to position hips/body in center of recliner- pt leaning/sitting to left.  Stand Pivot Transfers: 1: +2 Total assist (pt= 70%)  Stand Pivot Transfer Details (indicate cue type and reason): Verbal cues for sequencing, using visual targets to align self with with transfer.  Ambulation/Gait  Ambulation/Gait: Yes  Ambulation/Gait Assistance: 3: Mod assist  Ambulation/Gait Assistance Details (indicate cue type and reason): pt=70%; (A) for balance due to left lateral lean. Pt with narrow BOS, decreased coordination with gait pattern/steps as distance increased.  Ambulation Distance (Feet): 50 Feet  Assistive device: Rolling walker  Gait Pattern: Step-through pattern;Lateral trunk lean to left  Stairs: No  Wheelchair Mobility  Wheelchair Mobility: No  ADL:  ADL  Eating/Feeding: Performed;Modified independent (increased time to open containers)  Where Assessed - Eating/Feeding: Edge of bed  Grooming: Performed;Set up;Supervision/safety  Grooming Details (indicate cue type and reason): Safety secondary to patient with decreased hot/cold differentiation. Assessed in chair- will require assist for balance  if performed at sink  Where Assessed - Grooming: Sitting, chair  Upper Body Dressing: Simulated;Set up;Supervision/safety  Upper Body Dressing Details (indicate cue type and reason): Min assist for sitting balance as patient with left lateral lean.  Where Assessed - Upper Body Dressing: Sitting, bed  Lower Body Dressing: Simulated;Minimal assistance  Where Assessed - Lower Body Dressing: Sit to stand from bed  Toilet Transfer: Simulated;Moderate assistance  Toilet Transfer Details (indicate cue type and reason): simulated  from EOB to chair. VC to bring BOS forward to facilitate sit to stand and VC for hand placement  Toilet Transfer Method: Stand pivot  Toileting - Clothing Manipulation: Simulated;Minimal assistance  Where Assessed - Toileting Clothing Manipulation: (simulated sit to stand from EOB)  Toileting - Hygiene: Simulated;Minimal assistance  Where Assessed - Toileting Hygiene: Standing  Cognition:  Cognition  Overall Cognitive Status: Appears within functional limits for tasks assessed  Arousal/Alertness: Awake/alert  Orientation Level: Oriented X4  Attention: Selective  Selective Attention: Appears intact  Memory: Appears intact  Awareness: Appears intact  Problem Solving: Appears intact  Safety/Judgment: Appears intact  Cognition  Arousal/Alertness: Awake/alert  Overall Cognitive Status: Appears within functional limits for tasks assessed  Orientation Level: Oriented X4  Blood pressure 144/94, pulse 94, temperature 97.8 F (36.6 C), temperature source Oral, resp. rate 16, height 5\' 11"  (1.803 m), weight 86.183 kg (190 lb), SpO2 97.00%.  Physical Exam  Constitutional: He is oriented to person, place, and time. He appears well-developed.  HENT:  Head: Normocephalic.  Neck: Normal range of motion. Neck supple. No thyromegaly present.  Cardiovascular: Normal rate and regular rhythm.  Pulmonary/Chest: Effort normal. No respiratory distress.  Abdominal: He exhibits no distension. There is no guarding.  Musculoskeletal: He exhibits no edema.  Neurological: He is alert and oriented to person, place, and time. He has normal reflexes. A cranial nerve deficit and sensory deficit is present.  Patient with left ptosis/Horner's  and mild left-sided facial sensory loss. He had left-sided limb ataxia more than right.  Double vision impacted fine motor coordination of both upper extremities. Patient's vision was notable for improved visual acuity. He was able to identify smaller objects whereas he had  difficulty 2 days ago with this.. It was a bit difficult for him to read and follow the smaller objects as he has a hard time focusing. He did complain of double vision although no gross got disconjugate gaze is seen. 4-5 beats of nystagmus  was seen with left lateral gaze more than right lateral gaze. He had mild right arm and leg sensory loss to pain and temperature. Strength is grossly 4-4+ out of 5 bilaterally. Cognitively she is intact and quite sharp.  Skin: Skin is warm and dry.  Psychiatric: He has a normal mood and affect  Results for orders placed during the hospital encounter of 09/30/11 (from the past 48 hour(s))   PHENYTOIN LEVEL, TOTAL Status: Abnormal    Collection Time    10/04/11 5:47 AM   Component  Value  Range  Comment    Phenytoin Lvl  20.1 (*)  10.0 - 20.0 (ug/mL)     Mr Brain Ltd W/o Cm  10/04/2011 *RADIOLOGY REPORT* Clinical Data: Worsening neurologic status. MRI HEAD WITHOUT CONTRAST Technique: Multiplanar, multiecho pulse sequences of the brain and surrounding structures were obtained according to standard protocol without intravenous contrast. Comparison: CT head 10/01/2011. Findings: Per the request of the ordering physician, only diffusion imaging was performed. There is a Wallenburg type infarction affecting the lateral medulla  on the left extending into the inferior cerebellar peduncle. This lesion is slightly under 1 cm in size. A second area of slightly smaller infarction affects the medial right cerebellum inferiorly. These are obviously two different vascular territories, involving the right and left posterior inferior cerebellar artery territories. Based on the images provided, there is no assessment of patency or thrombosis of the posterior circulation vessels. Consider formal MRI brain with MRA intracranial circulation for further evaluation. IMPRESSION: Acute infarctions affect the left lateral medulla and right medial inferior cerebellum. See comments above.  Preliminary report was provided shortly after completion of the study. Original Report Authenticated By: Elsie Stain, M.D.   Post Admission Physician Evaluation:  1. Functional deficits secondary to left lateral medullary and right medial inferior cerebellar infarcts likely embolic 2. Patient is admitted to receive collaborative, interdisciplinary care between the physiatrist, rehab nursing staff, and therapy team. 3. Patient's level of medical complexity and substantial therapy needs in context of that medical necessity cannot be provided at a lesser intensity of care such as a SNF. 4. Patient has experienced substantial functional loss from his/her baseline which was documented above under the "Functional History" and "Functional Status" headings. Judging by the patient's diagnosis, physical exam, and functional history, the patient has potential for functional progress which will result in measurable gains while on inpatient rehab. These gains will be of substantial and practical use upon discharge in facilitating mobility and self-care at the household level. 5. Physiatrist will provide 24 hour management of medical needs as well as oversight of the therapy plan/treatment and provide guidance as appropriate regarding the interaction of the two. 6. 24 hour rehab nursing will assist with bladder management, bowel management, safety, disease management, medication administration, pain management and patient education and help integrate therapy concepts, techniques,education, etc. 7. PT will assess and treat for: Lower tremor knee coordination, posture, gait, vestibular deficits, safety, and her muscular education with modified independent goals. 8. OT will assess and treat for: Upper extremity strength, coordination, neuromuscular reeducation, safety, ADLs, functional mobility. Goals modified independent. 9. SLP will assess and treat for: Swallowing and speech with modified independent  goals. 10. Case Management and Social Worker will assess and treat for psychological issues and discharge planning. 11. Team conference will be held weekly to assess progress toward goals and to determine barriers to discharge. 12. Patient will receive at least 3 hours of therapy per day at least 5 days per week. 13. ELOS and Prognosis: 1 week excellent, patient is extremely motivated.   Medical Problem List and Plan:  1. 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 Continue Antivert as needed for dizziness. Request vestibular evaluation. 2.. DVT Prophylaxis/Anticoagulation: ASA/SCD/Support hose. Monitor for any signs of bleeding. Encourage ambulation as possible for additional DVT prophylaxis.  3. Dysphasia-presently on dysphagia to thin liquid diet. Speech therapy a followup advance diet as tolerated. Monitor for any signs of aspiration.  Patient is tolerating diet fairly well this point. 4. Seizure disorder-continue Lamictal 100 mg in the a.m. and 200 mg at bedtime. Monitor closely after recent seizure as she's currently at higher risk.  5. Mood-provide emotional support and positive reinforcement.  6. History of alcohol abuse-provide counseling along with counseling for secondary prevention by the medical team.

## 2011-10-06 DIAGNOSIS — I6330 Cerebral infarction due to thrombosis of unspecified cerebral artery: Secondary | ICD-10-CM

## 2011-10-06 DIAGNOSIS — Z5189 Encounter for other specified aftercare: Secondary | ICD-10-CM

## 2011-10-06 DIAGNOSIS — G8110 Spastic hemiplegia affecting unspecified side: Secondary | ICD-10-CM

## 2011-10-06 LAB — COMPREHENSIVE METABOLIC PANEL
AST: 16 U/L (ref 0–37)
Albumin: 3.7 g/dL (ref 3.5–5.2)
Calcium: 9.8 mg/dL (ref 8.4–10.5)
Creatinine, Ser: 1.01 mg/dL (ref 0.50–1.35)

## 2011-10-06 LAB — DIFFERENTIAL
Lymphocytes Relative: 20 % (ref 12–46)
Monocytes Absolute: 1.3 10*3/uL — ABNORMAL HIGH (ref 0.1–1.0)
Monocytes Relative: 21 % — ABNORMAL HIGH (ref 3–12)
Neutro Abs: 3.4 10*3/uL (ref 1.7–7.7)

## 2011-10-06 LAB — CBC
MCH: 30 pg (ref 26.0–34.0)
MCV: 87 fL (ref 78.0–100.0)
Platelets: 286 10*3/uL (ref 150–400)
RDW: 12.6 % (ref 11.5–15.5)
WBC: 6.3 10*3/uL (ref 4.0–10.5)

## 2011-10-06 MED ORDER — SIMVASTATIN 20 MG PO TABS
20.0000 mg | ORAL_TABLET | Freq: Every day | ORAL | Status: DC
  Administered 2011-10-06 – 2011-10-16 (×11): 20 mg via ORAL
  Filled 2011-10-06 (×13): qty 1

## 2011-10-06 MED ORDER — ENOXAPARIN SODIUM 40 MG/0.4ML ~~LOC~~ SOLN
40.0000 mg | SUBCUTANEOUS | Status: DC
  Administered 2011-10-06 – 2011-10-17 (×12): 40 mg via SUBCUTANEOUS
  Filled 2011-10-06 (×13): qty 0.4

## 2011-10-06 NOTE — Progress Notes (Addendum)
Assessment & Plan  Speech Language Pathology Assessment and Plan  Patient Details  Name: Connor [name] MRN: [id number] Date of Birth: 1946-09-14  SLP Diagnosis: dysphagia Rehab Potential: Good ELOS:   1 week with SLP  Individual Therapy Time: 1330-1430 Time Calculation (min): 60 min  Assessment & Plan Clinical Impression: Connor [name] is a 65 year old right-handed male with past medical history seizure disorder and chronic alcoholism as well as throat cancer with radiation therapy. Patient with noted left-sided weakness on December 5 while eating breakfast with friends. CT scan of the head as well as MRI of the brain with and without contrast were negative and patient was discharged home from the emergency department. He was admitted to the hospital on December 7 with falls as well as increased left-sided weakness as well as double vision. 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 CT angiogram of the head showed no evidence of posterior circulation stenosis or occlusion. TEE with normal ejection fraction and no emboli. Carotid Dopplers with no internal carotid artery stenosis reported. Maintained on aspirin therapy with full neurology workup ongoing. On December 10 patient with noted seizure lasting for minutes x1. Neurology services with followup. 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 His Lamictal that he was prior to admission was adjusted and no further seizure activity reported.  MBSS was conducted while on acute care and placed on dys 3 thin liquids.  Patient transferred to CIR on 10/05/2011 and now presents with baseline cognitive-linguistic function; however, would benefit from skilled services to address dysphagia (details below).   Skilled Intervention: Patient observed with thin liquids and SLP facilitated with demonstration and semantic cues to elicit hard effortful swallows to compensate for dysphagia.    SLP - End of Session Activity Tolerance: Improving Patient left: in chair;with call bell in reach Nurse Communication: Aspiration precautions reviewed;Diet recommendation;Swallow strategies reviewed Assessment Rehab Potential: Good Barriers to Discharge:  (questionable 24/7 supervision)   General  Chart Reviewed: Yes   Pain Pain Assessment Pain Assessment: No/denies pain Pain Score: 0-No pain Prior Functioning Cognitive/Linguistic Baseline: Within functional limits Type of Home: House Lives With: Family;Other (Comment) (step-son) Receives Help From: Family Vocation: Retired IT consultant Overall Cognitive Status: Appears within functional limits for tasks assessed Arousal/Alertness: Awake/alert Orientation Level: Oriented X4 Attention: Alternating Alternating Attention: Appears intact Memory: Appears intact Awareness: Appears intact Problem Solving: Appears intact (with complex tasks such as medication and financial tasks) Safety/Judgment: Appears intact Comments: WFL patient appers at baseline Comprehension Auditory Comprehension Yes/No Questions: Within Functional Limits Commands: Within Functional Limits Visual Recognition/Discrimination Discrimination: Exceptions to [facility] (new visual deficits) Reading Comprehension Reading Status: Within funtional limits (with large font) Expression Expression Primary Mode of Expression: Verbal Verbal Expression Initiation: No impairment Repetition: No impairment Naming: No impairment Pragmatics: No impairment Written Expression Dominant Hand: Right Written Expression: Within Functional Limits Oral/Motor Motor Speech Intelligibility: Intelligible   Clinical/Bedside Swallow Evaluation Patient Details  Name: Connor [name] MRN: [id number] DOB: 08-Jan-1946 Today's Date: 10/06/2011  Individual Therapy Session 1005-1040  Past Medical History:  Past Medical History  Diagnosis Date  . Head and neck cancer ~ 2009    S/P  radiation & chem  . Stroke   . Hypertension   . Seizures     "the bad kind;bite tongue;see Dr.Love; last 07/2011 & 08/2011"  . Chronic kidney disease   . Cancer     h/o neck  . Kidney carcinoma     h/o  . Arthritis  Past Surgical History:  Past Surgical History  Procedure Date  . Nephrectomy 1990's    left  . Joint replacement   . Total knee arthroplasty 2011    left  . Hydrocelectomy 11/2000    left  . Tee without cardioversion 10/04/2011    Procedure: TRANSESOPHAGEAL ECHOCARDIOGRAM (TEE);  Surgeon: Donato Schultz, MD;  Location: [facility] ENDOSCOPY;  Service: Cardiovascular;  Laterality: N/A;    Assessment/Recommendations/Treatment Plan   SLP Assessment Clinical Impression Statement: Pt. appears to be tolerating Dys.3 textures and thin liquids with intermittent multiple swallows and throat clears independently.  Per MBSS patient has pyriform sinus reside post swallow with all consistencies and would benefit from skilled SLP services to maximize swallow function with hard effortful swallows and participate in trials of regular textures at the bedside.  Risk for Aspiration: Mild Other Related Risk Factors:  (h/o throat cancer, s/p raditation to the neck)  Recommendations Recommended Consults: Other (Comment) (Repeat MBSS next week) Solid Consistency: Dysphagia 3 (Mechanical soft) Liquid Consistency: Thin Liquid Administration via: Cup;Straw Medication Administration: Whole meds with puree Supervision: Intermittent supervision to cue for compensatory strategies Compensations: Slow rate;Small sips/bites;Multiple dry swallows after each bite/sip;Clear throat intermittently;Follow solids with liquid;Effortful swallow Postural Changes and/or Swallow Maneuvers: Out of bed for meals;Seated upright 90 degrees;Upright 30-60 min after meal Oral Care Recommendations: Patient independent with oral care  Prognosis Prognosis for Safe Diet Advancement: Good Barriers to Reach Goals:  (premobid  deficis, xerostomia)  Individuals Consulted Consulted and Agree with Results and Recommendations: Patient;RN  Swallow Study Goals  SLP Swallowing Goals Patient will consume recommended diet without observed clinical signs of aspiration with: Modified independent assistance Swallow Study Goal #1 - Progress: Progressing toward goal Patient will utilize recommended strategies during swallow to increase swallowing safety with: Modified independent assistance Swallow Study Goal #2 - Progress: Progressing toward goal  Swallow Study Prior Functional Status   Patient reports consuming regular textures and thin liquids PTA; however, has a history of dysphagia s/p radiation secondary to throat cancer.  General  Date of Onset: 09/28/11 Other Pertinent Information: Pt. seen in hospital 09/28/11 and then discharged; readmitted 09/30/11 with worsening symptoms; BSE 09/30/11 and MBSS 10/01/11; Transferred to CIR 10/05/11. Type of Study: Bedside swallow evaluation Diet Prior to this Study: Dysphagia 2 (chopped);Thin liquids Temperature Spikes Noted: No Respiratory Status: Room air History of Intubation: No Behavior/Cognition: Alert;Cooperative;Pleasant mood Oral Cavity - Dentition: Adequate natural dentition Vision: Functional for self-feeding Patient Positioning: Upright in chair Baseline Vocal Quality: Hoarse Volitional Cough: Weak Volitional Swallow: Able to elicit Ice chips: Not tested  Oral Motor/Sensory Function  Labial ROM: Within Functional Limits Labial Symmetry: Within Functional Limits Labial Strength: Within Functional Limits Labial Sensation: Reduced Lingual ROM: Within Functional Limits Lingual Symmetry: Within Functional Limits Lingual Strength: Within Functional Limits Lingual Sensation: Within Functional Limits Facial ROM: Within Functional Limits Facial Symmetry: Within Functional Limits Facial Strength: Within Functional Limits Facial Sensation: Reduced Velum: Within  Functional Limits Mandible: Within Functional Limits  Consistency Results  Ice Chips Ice chips: Not tested  Thin Liquid Presentation: Cup;Spoon Other Comments: Intermittent multiple swallows; intermittent throat clear  Nectar Thick Liquid Nectar Thick Liquid: Not tested  Honey Thick Liquid Honey Thick Liquid: Not tested  Puree Puree: Not tested  Solid Solid: Within functional limits Presentation: Self Fed Pharyngeal Phase Impairments: Delayed Swallow;Multiple swallows Other Comments: Dys. 3 soft solids; intermittnet multiple swallows; intermittent throat clears  Recommendations for other services: None  Discharge Criteria: Patient will be discharged from SLP if patient refuses  treatment 3 consecutive times without medical reason, if treatment goals not met, if there is a change in medical status, if patient makes no progress towards goals or if patient is discharged from hospital.  The above assessment, treatment plan, treatment alternatives and goals were discussed and mutually agreed upon: by patient  Fae Pippin, M.A., CCC-SLP [phone] Daley Mooradian 10/06/2011,11:03 AM

## 2011-10-06 NOTE — Progress Notes (Signed)
Physical Therapy Assessment and Plan  Patient Details  Name: JAKEVION ARNEY MRN: [id number] Date of Birth: 09-20-1946  PT Diagnosis: Abnormal posture, Abnormality of gait, Ataxia, Ataxic gait, Coordination disorder, Impaired sensation and Muscle weakness Rehab Potential: Good ELOS: 10-14 days   Today's Date: 10/06/2011 Time: 0865-7846 Time Calculation (min): 57 min  Assessment & Plan Clinical Impression: Patient is a 65 y.o. 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  PTA patient lived with step son in two-level home with one step to enter. Patient's bedroom and full bathroom are on the main level with a full flight of stairs to the basement. There are also two steps from the main level to a step down kitchen and living room area. Patient was independent with all ADLs, IADLs, and gait, was driving and enjoyed playing golf frequently.  Patient currently presents to CIR with significant left lateral lean and lateropulsion during dynamic movement in unsupported sitting and standing, decreased right sided sensation, decreased bilat LE proprioception, decreased perception of midline, delayed balance and righting reactions, mild bilat LE weakness, decreased muscular endurance, decreased bilat LE eccentric control left > right, ataxic LE movement in open chain positions, decreased problem solving and anticipatory awareness, and overall decreased activity tolerance resulting in decreased functional independence.  Patient will benefit from skilled PT intervention to maximize safe functional mobility, minimize fall risk and decrease caregiver burden for planned discharge home with 24 hour assist.   PT - End of Session Activity Tolerance: Tolerates 10 - 20 min  activity with multiple rests PT Assessment Rehab Potential: Good Barriers to Discharge: Decreased caregiver support PT Plan PT Frequency: 1-2 X/day, 60-90 minutes Estimated Length of Stay: 10-14 days PT Treatment/Interventions: Ambulation/gait training;Balance/vestibular training;Community reintegration;DME/adaptive equipment instruction;Functional electrical stimulation;Patient/family education;Neuromuscular re-education;Functional mobility training;Splinting/orthotics;Stair training;Therapeutic Activities;Therapeutic Exercise;UE/LE Coordination activities;UE/LE Strength taining/ROM;Wheelchair propulsion/positioning;Visual/perceptual remediation/compensation PT Recommendation Follow Up Recommendations:  (TBD) Equipment Recommended: Rolling walker with 5" wheels  Precautions/Restrictions Precautions Precautions: Fall Precaution Comments: Wallenberg's Syndrome Required Braces or Orthoses: No Restrictions Weight Bearing Restrictions: No Other Position/Activity Restrictions: Severe left bias in sitting and standing.  Worsens with activity General Chart reviewed: yes Pain Pain Assessment Pain Assessment: No/denies pain Pain Score: 0-No pain Home Living/Prior Functioning Home Living Lives With: Family (step-son) Receives Help From: Family Type of Home: House Home Layout: Two level;Full bath on main level (Basement and one level. Bedroom on main level) Alternate Level Stairs-Rails: Right (descending to basement) Alternate Level Stairs-Number of Steps: 14-16 Home Access: Stairs to enter Entrance Stairs-Rails: None Entrance Stairs-Number of Steps: 1 Home Adaptive Equipment: Other (comment) (friends have W/C and RW patient may borrow) Prior Function Level of Independence: Independent with basic ADLs;Independent with homemaking with ambulation;Independent with gait;Independent with transfers Able to Take Stairs?: Yes Driving: Yes Vocation: Retired Leisure: Hobbies-yes  (Comment) Comments: loves golf and working in his shop Vision/Perception  Vision - History Baseline Vision: Wears glasses only for reading Patient Visual Report: Blurring of vision;Unable to keep objects in focus Vision - Assessment Eye Alignment: Impaired (comment) Perception Perception: Impaired Spatial Orientation: can sense leaning to the left and being off balance, unable to sense and maintain midline in unsupported positions  Cognition Overall Cognitive Status: Impaired (does not consistently follow directions) Arousal/Alertness: Awake/alert Orientation Level: Oriented X4 Attention: Selective Memory: Appears intact Awareness: Appears  intact Problem Solving: Impaired Problem Solving Impairment: Functional basic Behaviors: Impulsive Safety/Judgment: Appears intact ("I should not get up by myself. I might fall over.") Sensation Sensation Light Touch: Impaired by gross assessment Light Touch Impaired Details: Impaired RLE Stereognosis: Not tested Hot/Cold: Not tested (per patient report, impaired right UE and LE) Hot/Cold Impaired Details: Impaired RUE Proprioception: Impaired by gross assessment (decreased bilat ankles and great toes) Additional Comments: "more dull" right LE compared to left LE, decreased deep pressure sense right LE Coordination Gross Motor Movements are Fluid and Coordinated: No Fine Motor Movements are Fluid and Coordinated: No Heel Shin Test: impaired left LE, dysmetria Motor  Motor Motor: Ataxia;Abnormal postural alignment and control Motor - Skilled Clinical Observations: left lateropulsion  Mobility Bed Mobility Bed Mobility: No Transfers Sit to Stand: 4: Min assist Stand to Sit: 3: Mod assist Stand Pivot Transfers: 3: Mod assist Locomotion  Ambulation Ambulation/Gait Assistance: 2: Max assist Ambulation Distance (Feet): 5 Feet Assistive device: 1 person hand held assist Ambulation/Gait Assistance Details (indicate cue type and reason):  left lateropulsion, left LE narrows BOS, ataxic LE movement Stairs / Additional Locomotion Stairs: Yes Stairs Assistance: 4: Min assist Stair Management Technique: Two rails;Step to pattern Number of Stairs: 10  Height of Stairs: 6  Wheelchair Mobility Wheelchair Mobility: Yes Wheelchair Assistance: 4: Systems analyst: Both lower extermities Wheelchair Parts Management: Needs assistance Distance: 100 feet  Trunk/Postural Assessment  Cervical Assessment Cervical Assessment: Within Lobbyist Thoracic Assessment:  (left lateral shift) Lumbar Assessment Lumbar Assessment:  (left lateral shift) Postural Control Postural Control: Deficits on evaluation Trunk Control: strong left lateropulsion Righting Reactions: delayed Protective Responses: delayed Postural Limitations: left lateropulsion, increases in standing and with dynamic movement  Balance Static Sitting Balance Static Sitting - Balance Support: No upper extremity supported;Feet supported Static Sitting - Level of Assistance: 4: Min assist Dynamic Sitting Balance Dynamic Sitting - Balance Activities: Lateral lean/weight shifting;Forward lean/weight shifting;Reaching across midline Dynamic Sitting - Comments: mod assist, strong left lateropulsion Static Standing Balance Static Standing - Balance Support: No upper extremity supported Static Standing - Level of Assistance: 3: Mod assist Static Standing - Comment/# of Minutes: strong left lateral lean Dynamic Standing Balance Dynamic Standing - Level of Assistance: 2: Max assist Extremity Assessment    RLE Assessment RLE Assessment: AROM WNL. Strength grossly 4/5. LLE Assessment LLE Assessment: AROM WNL. Strength grossly 4/5.   Treatment Initiated During Session: Standing dynamic pre-gait activities with focus on midline orientation and moving out of midline and back to it, mod assist. Decreased graded left weight shift noted  with left lateropulsion during dynamic movement. Gait with left hand held assist 5 feet x 3 trials max assist. Facilitation for midline and right weight shift, verbal cues for left LE position to increase BOS and stability.  Recommendations for other services: None  Discharge Criteria: Patient will be discharged from PT if patient refuses treatment 3 consecutive times without medical reason, if treatment goals not met, if there is a change in medical status, if patient makes no progress towards goals or if patient is discharged from hospital.  The above assessment, treatment plan, treatment alternatives and goals were discussed and mutually agreed upon: by patient  [name] 10/06/2011, 1:13 PM

## 2011-10-06 NOTE — Patient Care Conference (Signed)
Inpatient RehabilitationTeam Conference Note Date: 10/06/11   Time: 4:20PM   Patient Name: Connor [name]      Medical Record Number: [id number]  Date of Birth: September 21, 1946 Sex: Male         Room/Bed: 4031/4031-01 Payor Info: Payor: [employer] [place] [employer]  Plan: BLUE MEDICARE [facility]  Product Type: *No Product type*     Admitting Diagnosis: LT CVA  Admit Date/Time:  10/05/2011  2:38 PM Admission Comments: No comment available   Primary Diagnosis:  CVA (cerebral infarction) Principal Problem: CVA (cerebral infarction)  Patient Active Problem List  Diagnoses Date Noted  . Hyperlipidemia LDL goal < 100 10/05/2011  . 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 CVA (cerebral infarction) 10/05/2011  . Stroke, acute, embolic 09/30/2011  . HTN (hypertension) 09/30/2011  . Seizure disorder 09/30/2011    Expected Discharge Date: Expected Discharge Date: 10/17/11  Team Members Present: Physician: Dr. Claudette Laws Case Manager Present: Melanee Spry, RN Social Worker Present: Dossie Der, LCSW PT Present: Edman Circle, PT OT Present: Leonette Monarch, Felipa Eth, OT SLP Present: Fae Pippin, SLP RN Present: Lauris Chroman    Current Status/Progress Goal Weekly Team Focus  Medical   L facial numbness RUE and RLE numbness, poor balance, dysphagia  Improve balance and swallow  Balance training with appropriate assistive device   Bowel/Bladder   continent of bladder. no bowel movement this shift  continent with toileting      Swallow/Nutrition/ Hydration    Dys 3 thin with intermittent supervision       trials of regular textures     least restrictive p.o. intake    ADL's             Mobility   mod assist with basic self care skills  intermittent min assist / supervision      Communication     at baseline function         Safety/Cognition/ Behavioral Observations    at baseline function         Pain   denies pain  zero pain      Skin   skin clean and intact   prevent pressure ulcer          *See Interdisciplinary Assessment and Plan and progress notes for long and short-term goals  Barriers to Discharge: ?24/7 supervision available    Possible Resolutions to Barriers:  Either upgrade to Mod I or find family/friend to provide Sup    Discharge Planning/Teaching Needs:  Home with step-son and family/friends to assist. Hopes he will not need 24 hour assist.      Team Discussion: Ambulates much better w/ walker than w/out.  At this time supervision would be recommended at d/c.   Revisions to Treatment Plan: none    Continued Need for Acute Rehabilitation Level of Care: The patient requires daily medical management by a physician with specialized training in physical medicine and rehabilitation for the following conditions: Daily direction of a multidisciplinary physical rehabilitation program to ensure safe treatment while eliciting the highest outcome that is of practical value to the patient.: Yes Daily medical management of patient stability for increased activity during participation in an intensive rehabilitation regime.: Yes Daily analysis of laboratory values and/or radiology reports with any subsequent need for medication adjustment of medical intervention for : Neurological problems  [name] 10/07/2011, 10:08 AM

## 2011-10-06 NOTE — Progress Notes (Signed)
Patient ID: Connor [name], male   DOB: Aug 13, 1946, 65 y.o.   MRN: [id number] Subjective/Complaints: Numbness on L side of face and R side of body  Review of Systems  Neurological: Positive for tingling, sensory change and speech change.  All other systems reviewed and are negative.  Constitutional: He is oriented to person, place, and time. He appears well-developed.  HENT:  Head: Normocephalic.  Neck: Normal range of motion. Neck supple. No thyromegaly present.  Cardiovascular: Normal rate and regular rhythm.  Pulmonary/Chest: Effort normal. No respiratory distress.  Abdominal: He exhibits no distension. There is no guarding.  Musculoskeletal: He exhibits no edema.  Neurological: He is alert and oriented to person, place, and time. He has normal reflexes. A cranial nerve deficit and sensory deficit is present.  Patient with left ptosis and mild left-sided facial sensory loss. He had left-sided limb ataxia more than right double vision impacted fine motor coordination of both upper extremities. He did have double vision although no gross got disconjugate gaze is seen. No nystagmus was appreciated. Patient did not experience vertigo. He had mild right arm and leg sensory loss to pain and temperature. Strength is grossly 4-4+ out of 5 bilaterally. Cognitively he is intact.  Skin: Skin is warm and dry.  Psychiatric: He has a normal mood and affect   Objective: Vital Signs: Blood pressure 157/94, pulse 85, temperature 97.8 F (36.6 C), temperature source Oral, resp. rate 20, height 5\' 11"  (1.803 m), weight 87.5 kg (192 lb 14.4 oz), SpO2 100.00%. No results found. Results for orders placed during the hospital encounter of 09/30/11 (from the past 72 hour(s))  PHENYTOIN LEVEL, TOTAL     Status: Abnormal   Collection Time   10/04/11  5:47 AM      Component Value Range Comment   Phenytoin Lvl 20.1 (*) 10.0 - 20.0 (ug/mL)        Assessment/Plan: 1. 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 Physiatrist is providing close team supervision and 24 hour management of active medical problems listed below. Physiatrist and rehab team continue to assess barriers to discharge/monitor patient progress toward functional and medical goals. Mobility:          ADL:    Cognition: Cognition Orientation Level: Oriented X4 Cognition Orientation Level: Oriented X4  2. Anticoagulation/DVT prophylaxis with Pharmaceutical: Lovenox 3. Pain Management: May need gabapentin if develops dysesthesia Medical Problem List and Plan:  1. 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 Continue Antivert as needed for dizziness. May need vestibular evaluation  2.. DVT Prophylaxis/Anticoagulation: ASA/SCD/Support hose. Monitor for increased signs of deep vein thrombosis  3. Dysphasia-presently on dysphagia to thin liquid diet. Speech therapy a followup advance diet as tolerated. Monitor for any signs of aspiration  4. Seizure disorder-continue Lamictal 100 mg in the a.m. and 200 mg at bedtime. Monitor closely after recent seizure.  5. Mood-provide emotional support and positive reinforcement.  6. History of alcohol abuse-provide counseling   1  KIRSTEINS,ANDREW E 10/06/2011, 6:12 AM

## 2011-10-06 NOTE — Progress Notes (Addendum)
Occupational Therapy Assessment and Plan  Patient Details  Name: Connor [name] MRN: [id number] Date of Birth: 15-Jul-1946  OT Diagnosis: abnormal posture, ataxia, disturbance of vision and diplopia / blurred vision Rehab Potential: Rehab Potential: Good ELOS: 12-14 days   Today's Date: 10/06/2011 Time: 0905-1000 55 min   Assessment & Plan Clinical Impression: Patient is a 65 y.o. year old male with recent admission to the hospital on 12/7 with headache, numbness and weakness.  Patient transferred to CIR on 10/05/2011 .  Patient's past medical history is significant for throat cancer s/p radiation and chemo, seizure disorder and chronic alcoholism.    Patient currently requires mod with basic self-care skills secondary to unbalanced muscle activation, ataxia and decreased coordination, diplopia, eye malalignment, decreased midline orientation, and decreased sitting balance, decreased standing balance, decreased postural control and decreased balance strategies.  Prior to hospitalization, patient could complete ADLindependently.  Patient will benefit from skilled intervention to increase independence with basic self-care skills and increase level of independence with iADL prior to discharge home with care partner.  Anticipate patient will require intermittent supervision and minimal physical assistance and follow up home health.  OT - End of Session Activity Tolerance: Tolerates 30+ min activity with multiple rests Endurance Deficit: Yes Endurance Deficit Description: Reports fatigue with bathing and dressing OT Assessment Rehab Potential: Good Barriers to Discharge:  (will need supervision / min a post discharge) OT Plan OT Frequency: 1-2 X/day, 60-90 minutes Estimated Length of Stay: 12-14 days OT Treatment/Interventions: Balance/vestibular training;Community reintegration;DME/adaptive equipment instruction;Functional mobility training;Patient/family education;Self Care/advanced ADL  retraining;Therapeutic Activities;Therapeutic Exercise;UE/LE Coordination activities;Visual/perceptual remediation/compensation OT Recommendation Recommendations for Other Services: Vestibular eval Follow Up Recommendations: Home health OT Equipment Recommended:  (to be determined)  Precautions/Restrictions  Precautions Precautions: Fall Precaution Comments: Wallenberg's Syndrome Required Braces or Orthoses: No Restrictions Weight Bearing Restrictions: No    Pain Pain Assessment Pain Assessment: No/denies pain Pain Score: 0-No pain Home Living/Prior Functioning Home Living Lives With: Family (step-son) Receives Help From: Family Type of Home: House Home Layout: Two level;Full bath on main level (Basement and one level. Bedroom on main level) Alternate Level Stairs-Rails: Right (descending to basement) Alternate Level Stairs-Number of Steps: 14-16 Home Access: Stairs to enter Entrance Stairs-Rails: None Entrance Stairs-Number of Steps: 1 Home Adaptive Equipment: Other (comment) (friends have W/C and RW patient may borrow) Prior Function Level of Independence: Independent with basic ADLs;Independent with homemaking with ambulation;Independent with gait;Independent with transfers Able to Take Stairs?: Yes Driving: Yes Vocation: Retired Leisure: Hobbies-yes (Comment) Comments: loves golf and working in his shop ADL   Vision/Perception  Vision - History Baseline Vision: Wears glasses only for reading Patient Visual Report: Blurring of vision;Unable to keep objects in focus Perception Perception: Impaired Spatial Orientation: can sense leaning to the left and being off balance, unable to sense and maintain midline in unsupported positions  Cognition Arousal/Alertness: Awake/alert Orientation Level: Oriented X4 Attention: Selective Memory: Appears intact Awareness: Appears intact Problem Solving: Impaired Problem Solving Impairment: Functional basic Safety/Judgment:  Appears intact ("I should not get up by myself. I might fall over.") Sensation Sensation Light Touch: Impaired by gross assessment Light Touch Impaired Details: Impaired RUE Stereognosis: Not tested Hot/Cold: Not tested (per patient report, impaired right UE and LE) Hot/Cold Impaired Details: Impaired RUE Proprioception: Impaired by gross assessment  Coordination Gross Motor Movements are Fluid and Coordinated: No Fine Motor Movements are Fluid and Coordinated: No Heel Shin Test: impaired left LE, dysmetria Motor  Motor Motor: Ataxia;Abnormal postural alignment and control Motor -  Skilled Clinical Observations: left lateropulsion Mobility  Bed Mobility Bed Mobility: No Transfers Sit to Stand: 4: Min assist Stand to Sit: 3: Mod assist  Trunk/Postural Assessment  Cervical Assessment Cervical Assessment: Within Functional Limits Thoracic Assessment Thoracic Assessment: Within Functional Limits (rotates upper trunk to compensate for balance) Lumbar Assessment Lumbar Assessment:  (left lateral shift) Postural Control Postural Control: Deficits on evaluation Trunk Control: strong left lateropulsion Righting Reactions: delayed Protective Responses: delayed Postural Limitations: left lateropulsion, increases in standing and with dynamic movement  Balance Static Sitting Balance Static Sitting - Balance Support: No upper extremity supported;Feet supported Static Sitting - Level of Assistance: 4: Min assist Dynamic Sitting Balance Dynamic Sitting - Balance Activities: Lateral lean/weight shifting;Forward lean/weight shifting;Reaching across midline Dynamic Sitting - Comments: mod assist, strong left lateropulsion Static Standing Balance Static Standing - Balance Support: No upper extremity supported Static Standing - Level of Assistance: 3: Mod assist Static Standing - Comment/# of Minutes: strong left lateral lean Dynamic Standing Balance Dynamic Standing - Level of Assistance:  2: Max assist   Skilled clinical intervention to include:  Encouraging patient to manage body mass over base of support during functional activities.  Patient indicates it takes so much concentration to maintain balance, that he has difficulty adding function without significant loss of balance.    Recommendations for other services: None  Discharge Criteria: Patient will be discharged from OT if patient refuses treatment 3 consecutive times without medical reason, if treatment goals not met, if there is a change in medical status, if patient makes no progress towards goals or if patient is discharged from hospital.  The above assessment, treatment plan, treatment alternatives and goals were discussed and mutually agreed upon: by patient  Collier Salina 10/06/2011, 3:49 PM

## 2011-10-06 NOTE — Progress Notes (Signed)
Patient information reviewed and entered into [facility] system by Ellamarie Naeve, RN, CRRN, PPS Coordinator.  Information including medical coding and functional independence measure will be reviewed and updated through discharge.     Per nursing patient was given "Data Collection Information Summary for Patients in Inpatient Rehabilitation Facilities with attached "Privacy Act Statement-Health Care Records" upon admission.   

## 2011-10-06 NOTE — Progress Notes (Signed)
Recreational Therapy Assessment and Plan  Patient Details  Name: Connor [name] MRN: [id number] Date of Birth: 29-Feb-1946  Rehab Potential: Good ELOS: 14 days   Assessment Clinical Impression: Patient is a 65 y.o. year old male with recent admission to the hospital on 12/7 with headache, numbness and weakness. Patient transferred to CIR on 10/05/2011 . Patient's past medical history is significant for throat cancer s/p radiation and chemo, seizure disorder and chronic alcoholism. Pt presents with decreased activity tolerance, decreased functional mobility, decreased balance, ataxia and decreased coordination, double vision, decreased midline orientation limiting pt's independence with leisure/community pursuits.    Recreational Therapy Leisure History/Participation Premorbid leisure interest/current participation: Crafts - Woodworking;Sports - Golf;Community - Press photographer - Grocery store;Community - Travel (Comment);Community - Other (Comment);Nature - Fishing;Nature - Lawn care (self employeed PT, heating & air, sheet metal  & woodworking) Spiritual Interests: Other (Comment) (get togetherwith guys for breakfast) Other Leisure Interests: Computer;Television;Cooking/Baking Leisure Participation Style: With Family/Friends Awareness of Community Resources: Good-identify 3 post discharge leisure resources [employer] Appropriate for Education?: Yes Patient Agreeable to [employer]?: Yes Patient agreeable to Pet Therapy: No Does patient have pets?: No Social interaction - Mood/Behavior: Cooperative Recreational Therapy Orientation Orientation -Reviewed with patient: Available activity resources Strengths/Weaknesses Patient Strengths/Abilities: Willingness to participate;Active premorbidly Patient weaknesses: Physical limitations  Plan Rec Therapy Plan Is patient appropriate for Therapeutic Recreation?: Yes Rehab Potential: Good Treatment times per week: min 1 time  per week> 20 minutes Estimated Length of Stay: 14 days Therapy Goals Achieved By:: Recreation/leisure participation;Community reintegration/education;1:1 session;Group participation (Comment);Adaptive equipment instruction;Patient/family education  Recommendations for other services: None  Discharge Criteria: Patient will be discharged from TR if patient refuses treatment 3 consecutive times without medical reason.  If treatment goals not met, if there is a change in medical status, if patient makes no progress towards goals or if patient is discharged from hospital.  The above assessment, treatment plan, treatment alternatives and goals were discussed and mutually agreed upon: by patient  Kerman Pfost 10/06/2011, 5:06 PM

## 2011-10-07 NOTE — Progress Notes (Signed)
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  Worsens with activity  Short Term Goals: OT Short Term Goal 1: Patient will transfer into shower with min assist OT Short Term Goal 2: Patient will dress lower body with set up assist OT Short Term Goal 3: Patient will transfer to toilet with min assist OT Short Term Goal 4: Patient will shower with supervision  Skilled Therapeutic Interventions/Progress Updates:    Pt engaged int therapeutic activities to address balance, visual convergence, functional mobility.  Pt. Was awakened and took 10 minutes to gain balance and even then he was pull toward left side.  After activities as listed below, he did better.  When give targets to reach for, he was better at disassociation between upper and lower body.     Pain;  none   Other Treatments  Quadraped balance, tall kneeling, balance on blue ball, disassociation between shoulders, hips.  Visual scanning with working on convergence in midline.  Therapy/Group: Individual Therapy  Humberto Seals 10/07/2011, 4:37 PM

## 2011-10-07 NOTE — Progress Notes (Signed)
Inpatient Rehabilitation Center Individual Statement of Services  Patient Name:  Connor [name]  Date:  10/07/2011  Welcome to the Inpatient Rehabilitation Center.  Our goal is to provide you with an individualized program based on your diagnosis and situation, designed to meet your specific needs.  With this comprehensive rehabilitation program, you will be expected to participate in at least 3 hours of rehabilitation therapies Monday-Friday, with modified therapy programming on the weekends.  Your rehabilitation program will include the following services:  Physical Therapy (PT), Occupational Therapy (OT), Speech Therapy (ST), 24 hour per day rehabilitation nursing, Therapeutic Recreaction (TR), Case Management (RN and Child psychotherapist), Rehabilitation Medicine, Nutrition Services and Pharmacy Services  Weekly team conferences will be held on Wednesdays to discuss your progress.  Your RN Case Designer, television/film set will talk with you frequently to get your input and to update you on team discussions.  Team conferences with you and your family in attendance may also be held.  Estimated length of stay:10-14 days      Overall predicted outcome: Minimum assistance - supervision  Depending on your progress and recovery, your program may change.  Your RN Case Estate agent will coordinate services and will keep you informed of any changes.  Your RN Sports coach and SW names and contact numbers are listed  below.  The following services may also be recommended but are not provided by the Inpatient Rehabilitation Center:   Driving Evaluations  Home Health Rehabiltiation Services  Outpatient Rehabilitatation [facility]  Vocational Rehabilitation   Arrangements will be made to provide these services after discharge if needed.  Arrangements include referral to agencies that provide these services.  Your insurance has been verified to be:  [employer] Your primary doctor is:  Dr. Jerelyn Scott  Pertinent information will be shared with your doctor and your insurance company.  Case Manager: Lutricia Horsfall, [facility] [phone]  Social Worker:  Dossie Der, [place] [phone]  Information discussed with and copy given to patient by: [name], 10/07/2011, 10:58 AM

## 2011-10-07 NOTE — Progress Notes (Signed)
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  Worsens with activity  Short Term Goals: PT Short Term Goal 1: Patient will consistently transfer with min assist. PT Short Term Goal 1 - Progress: Progressing toward goal PT Short Term Goal 2: Patient will propel W/C > 150 feet with supervision, controlled environment. PT Short Term Goal 2 - Progress: Progressing toward goal PT Short Term Goal 3: Patient will maintain dynamic sitting balance with supervision > 5 minutes during functional task. PT Short Term Goal 3 - Progress: Progressing toward goal PT Short Term Goal 4: Patient will gait 75 feet with min assist, LRAD. PT Short Term Goal 4 - Progress: Progressing toward goal PT Short Term Goal 5: Patient will negotiate 12 steps with 1 rail min assist. PT Short Term Goal 5 - Progress: Progressing toward goal  Skilled Therapeutic Interventions/Progress Updates:     General Chart Reviewed: Yes Family/Caregiver Present: No  Pain Pain Assessment Pain Assessment: No/denies pain Pain Score: 0-No pain  Other Treatments  W/C mobility to and from therapy gym with bilat LEs min assist, focus on bilat LE coordination and graded, smooth movement. Standing with mirror for visual feedback and left hand held assist, facilitation to orient to and maintain midline with increased right weight shift. Progressed to left heel lifts and stepping forward and backward with left LE to further increase sustained right weight shift with mod-max assist due to left lateropulsion. Sidestepping right 5 feet x  2 with mod assist for increased right weight shift, focus on smooth, graded movements.  Therapy/Group: Individual Therapy  [name] 10/07/2011, 9:43 AM

## 2011-10-07 NOTE — Progress Notes (Signed)
Per State Regulation 482.30 This chart was reviewed for medical necessity with respect to the patient's Admission/Duration of stay. Pt participating in therapies. Upgraded to regular diet today with intermittent supervision.  [name]                 Nurse Care Manager              Next Review Date: 12/18.12

## 2011-10-07 NOTE — Progress Notes (Signed)
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  Worsens with activity  Short Term Goals: OT Short Term Goal 1: Patient will transfer into shower with min assist OT Short Term Goal 2: Patient will dress lower body with set up assist OT Short Term Goal 3: Patient will transfer to toilet with min assist OT Short Term Goal 4: Patient will shower with supervision  Skilled Therapeutic Interventions/Progress Updates:    Addressed therapeutic dressing at wc level.  Pt needed instuctional cues for wc mobility and safety.  He showed intellectual and anticipatory awareness after instructions.  Pt.'s has difficulty with diplopia and eye converging.  He was educated on sustaining visual attention to objects in room while performing BADL.      General General Chart Reviewed: Yes    Pain Pain Assessment Pain Assessment: No/denies pain Pain Score: 0-No pain Pain Type: Acute pain Pain Location: Head Pain Orientation: Left Pain Descriptors: Aching Pain Onset: On-going Pain Intervention(s): Other (Comment) (patient reports he will call if it gets worse)     Exercises;   iNSTRUCTED PT ON CORE TRUNK CONTROL DURING STATICA STANDING BALANCE DURING ADL.  PT. STOOD FOR 1 MIN. AFTER SEVERAL ATTEMPTS WITH CORE CONTROL.  UNABLE TO MOVE DYNAMICALLY.       Therapy/Group: Individual Therapy  Humberto Seals 10/07/2011, 10:45 AM

## 2011-10-07 NOTE — Progress Notes (Signed)
Progress Notes  Speech Language Pathology Therapy Note  Patient Details  Name: Connor [name] MRN: [id number] Date of Birth: 20-Jan-1946  Today's Date: 10/07/2011 Time: 0805-0900 Time Calculation (min): 55 min  Precautions: Precautions Precautions: Fall Precaution Comments: Wallenberg's Syndrome Required Braces or Orthoses: No Restrictions Weight Bearing Restrictions: No Other Position/Activity Restrictions: Severe left bias in sitting and standing.  Worsens with activity  Short Term Goals: 1. Patient will consume recommended diet without observed clinical signs of aspiration with: Modified independent assistance  2. Patient will utilize recommended strategies during swallow to increase swallowing safety with: Modified independent assistance  Skilled Therapeutic Interventions/Progress Updates:  Patient was observed with : Soft and Regular textures and Thin liquids.  Patient was noted to have s/s of aspiration : Yes:  Intermittent throat clear, which he reports to be his baseline  Lung Sounds:  WNL Temperature: WNL  Patient required: supervision cues to consistently follow precautions/strategies  Clinical Impression: SLP facilitated session with demonstrative and semantic cues to utilize hard effortful swallows and utilize multiple swallows; patient modified independent with use of alternating solids and liquids and request to sit up at meals   Recommendations:  Advance diet to regular textures and continue with intermittent supervision  Vital Signs Therapy Vitals Temp: 98.6 F (37 C) Temp src: Oral Pulse Rate: 103  Resp: 20  BP: 125/87 mmHg Patient Position, if appropriate: Lying Oxygen Therapy SpO2: 94 % O2 Device: None (Room air) Pain Pain Assessment Pain Assessment: 0-10 Pain Score:   2 Pain Type: Acute pain Pain Location: Head Pain Orientation: Left Pain Descriptors: Aching Pain Onset: On-going Pain Intervention(s): Other (Comment) (patient reports he  will call if it gets worse)  Therapy/Group: Individual Therapy  Charlane Ferretti., CCC-SLP [phone] [name] 10/07/2011 8:45 AM

## 2011-10-07 NOTE — Progress Notes (Signed)
Patient ID: Connor [name], male   DOB: Jul 23, 1946, 65 y.o.   MRN: [id number] Patient ID: Connor [name], male   DOB: 1945/11/27, 65 y.o.   MRN: [id number] Subjective/Complaints: Numbness on L side of face and R side of body  Review of Systems  Neurological: Positive for tingling, sensory change and speech change.  All other systems reviewed and are negative.  Constitutional: He is oriented to person, place, and time. He appears well-developed.  HENT:  Head: Normocephalic.  Neck: Normal range of motion. Neck supple. No thyromegaly present.  Cardiovascular: Normal rate and regular rhythm.  Pulmonary/Chest: Effort normal. No respiratory distress.  Abdominal: He exhibits no distension. There is no guarding.  Musculoskeletal: He exhibits no edema.  Neurological: He is alert and oriented to person, place, and time. He has normal reflexes. A cranial nerve deficit and sensory deficit is present.  Patient with left ptosis and mild left-sided facial sensory loss. He had left-sided limb ataxia more than right double vision impacted fine motor coordination of both upper extremities. He did have double vision although no gross got disconjugate gaze is seen. No nystagmus was appreciated. Patient did not experience vertigo. He had mild right arm and leg sensory loss to pain and temperature. Strength is grossly 4-4+ out of 5 bilaterally. Cognitively he is intact.  Skin: Skin is warm and dry.  Psychiatric: He has a normal mood and affect   Objective: Vital Signs: Blood pressure 157/94, pulse 85, temperature 97.8 F (36.6 C), temperature source Oral, resp. rate 20, height 5\' 11"  (1.803 m), weight 87.5 kg (192 lb 14.4 oz), SpO2 100.00%. No results found. Results for orders placed during the hospital encounter of 09/30/11 (from the past 72 hour(s))  PHENYTOIN LEVEL, TOTAL     Status: Abnormal   Collection Time   10/04/11  5:47 AM      Component Value Range Comment   Phenytoin Lvl 20.1 (*) 10.0 - 20.0  (ug/mL)        Assessment/Plan: 1. 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 Physiatrist is providing close team supervision and 24 hour management of active medical problems listed below. Physiatrist and rehab team continue to assess barriers to discharge/monitor patient progress toward functional and medical goals.  Cognition: Cognition Orientation Level: Oriented X4 Cognition Orientation Level: Oriented X4  2. Anticoagulation/DVT prophylaxis with Pharmaceutical: Lovenox 3. Pain Management: May need gabapentin if develops dysesthesia Medical Problem List and Plan:  1. 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 Continue Antivert as needed for dizziness. May need vestibular evaluation  2.. DVT Prophylaxis/Anticoagulation: ASA/SCD/Support hose. Monitor for increased signs of deep vein thrombosis  3. Dysphasia-presently on dysphagia to thin liquid diet. Speech therapy a followup advance diet as tolerated. Monitor for any signs of aspiration  4. Seizure disorder-continue Lamictal 100 mg in the a.m. and 200 mg at bedtime. Monitor closely after recent seizure.  5. Mood-provide emotional support and positive reinforcement.  6. History of alcohol abuse-provide counseling   1  Connor [name] 10/06/2011, 6:12 AM

## 2011-10-08 MED ORDER — MECLIZINE HCL 25 MG PO TABS
25.0000 mg | ORAL_TABLET | Freq: Three times a day (TID) | ORAL | Status: DC | PRN
  Filled 2011-10-08: qty 1

## 2011-10-08 NOTE — Progress Notes (Signed)
Progress Notes  Speech Language Pathology Therapy Note  Patient Details  Name: Connor [name] MRN: [id number] Date of Birth: 09-Feb-1946  Today's Date: 10/08/2011 Time: 8:15 - 9:45   Precautions: Precautions Precautions: Fall Precaution Comments: Wallenberg's Syndrome with left lateropulsion Required Braces or Orthoses: No Restrictions Weight Bearing Restrictions: No Other Position/Activity Restrictions: Severe left bias in sitting and standing.  Worsens with activity  Short Term Goals:  Skilled Therapeutic Interventions/Progress Updates: Pt independently follows swallow strategies. Reviewed results of MBSS - pt aware. He is aware of foods he has more difficulty with and is able to avoid those. Education provided re: s/s of CVA. Educated pt re: oral moisturizer, as he c/o dry mouth. Provided 2 packets of oral balance. Precautions/Restrictions  Restrictions Weight Bearing Restrictions: No General    Vital Signs   Pain Pain Assessment Pain Assessment: 0-10 Pain Score:   4 Pain Type: Chronic pain Pain Location: Head Pain Descriptors: Constant Pain Onset: On-going Pain Intervention(s): MD notified (Comment) (Pt told Dr. Larna Daughters during therapy session) Multiple Pain Sites: No  Oral/Motor: Oral Motor/Sensory Function Labial ROM: Within Functional Limits Labial Symmetry: Within Functional Limits Labial Strength: Within Functional Limits Labial Sensation: Reduced Lingual ROM: Within Functional Limits Lingual Symmetry: Within Functional Limits Lingual Strength: Within Functional Limits Lingual Sensation: Within Functional Limits Facial ROM: Within Functional Limits Facial Symmetry: Within Functional Limits Facial Strength: Within Functional Limits Facial Sensation: Within Functional Limits Velum: Within Functional Limits Mandible: Within Functional Limits Motor Speech Intelligibility: Intelligible Comprehension: Auditory Comprehension Yes/No Questions: Within  Functional Limits Commands: Within Functional Limits Visual Recognition/Discrimination Discrimination: Within Function Limits Reading Comprehension Reading Status: Within funtional limits (with larger print, strategies to close one eye) Expression: Expression Primary Mode of Expression: Verbal Verbal Expression Initiation: No impairment Repetition: No impairment Naming: No impairment Pragmatics: No impairment Written Expression Dominant Hand: Right Written Expression: Within Functional Limits  Therapy/Group: Individual Therapy  Alice Reichert Radene Journey 10/08/2011 11:12 AM

## 2011-10-08 NOTE — Progress Notes (Signed)
Progress Notes  Speech Language Pathology Therapy Note  Patient Details  Name: Connor [name] MRN: [id number] Date of Birth: [birth year]  Today's Date: 10/08/2011 Time: 11:30 to 12:30  Time Calculation (min): 60 min  Precautions: Precautions Precautions: Fall Precaution Comments: Wallenberg's Syndrome with left lateropulsion Required Braces or Orthoses: No Restrictions Weight Bearing Restrictions: No Other Position/Activity Restrictions: Severe left bias in sitting and standing.  Worsens with activity  Short Term Goals:  Skilled Therapeutic Interventions/Progress Updates: Pt attended [employer]. He followed all swallowing strategies with modified independence. No overt s/s of aspiration. He participated in basic conversation independently. Pt denies pain.  Precautions/Restrictions    General    Vital Signs Therapy Vitals Temp: 97.7 F (36.5 C) Temp src: Oral Pulse Rate: 90  Resp: 20  BP: 152/91 mmHg Patient Position, if appropriate: Sitting Oxygen Therapy SpO2: 96 % O2 Device: None (Room air) Pain Pain Assessment Pain Score:   4  Oral/Motor: Oral Motor/Sensory Function Labial ROM: Within Functional Limits Labial Symmetry: Within Functional Limits Labial Strength: Within Functional Limits Labial Sensation: Reduced Lingual ROM: Within Functional Limits Lingual Symmetry: Within Functional Limits Lingual Strength: Within Functional Limits Lingual Sensation: Within Functional Limits Facial ROM: Within Functional Limits Facial Symmetry: Within Functional Limits Facial Strength: Within Functional Limits Facial Sensation: Within Functional Limits Velum: Within Functional Limits Mandible: Within Functional Limits Motor Speech Intelligibility: Intelligible Comprehension: Auditory Comprehension Yes/No Questions: Within Functional Limits Commands: Within Functional Limits Visual Recognition/Discrimination Discrimination: Within Function Limits Reading  Comprehension Reading Status: Within funtional limits (with larger print, strategies to close one eye) Expression: Expression Primary Mode of Expression: Verbal Verbal Expression Initiation: No impairment Repetition: No impairment Naming: No impairment Pragmatics: No impairment Written Expression Dominant Hand: Right Written Expression: Within Functional Limits  Therapy/Group: Group Therapy  Lovvorn, Radene Journey 10/08/2011 5:37 PM

## 2011-10-08 NOTE — Progress Notes (Signed)
Occupational Therapy Session Note  Patient Details  Name: Connor [name] MRN: [id number] Date of Birth: 10-18-1946  Today's Date: 10/08/2011 Time: 4098-1191 Time Calculation (min): 52 min  Precautions:  Precautions: Fall Precaution Comments: Wallenberg's Syndrome with left lateropulsion Required Braces or Orthoses: No Weight Bearing Restrictions: No Other Position/Activity Restrictions: Left bias in sitting and standing ranging from mild to severe.    Skilled Therapeutic Interventions/Progress Updates:  Therapeutic Activities Group to include sit<>stand, dynamic standing and squat while reaching.  Focus session on slow and controlled movements and no use of UE's with sit<> stand.  Weight shifts to rights with wide base of support and with staggered stance.  Patient close supervision to total assist with these activities.  Patient's level of assist seemed to improve with practice and when competing with other patients.  Therapy/Group: Group Therapy  Swayzie Choate 10/08/2011, 5:04 PM

## 2011-10-08 NOTE — Progress Notes (Signed)
Rt cva left side weak. Wears hearing aids.stand pivot 1 person assist to transfer  Uses urinal staff empties.Bed alarm for safety. Bathing and dressing with therapy see fim for details .

## 2011-10-08 NOTE — Progress Notes (Signed)
Occupational Therapy Session Note  Patient Details  Name: TAVIO BIEGEL MRN: [id number] Date of Birth: 1945-12-25  Today's Date: 10/08/2011 Time: 1010-1055 Time Calculation (min): 45 min  Precautions: Precautions Precautions: Fall Precaution Comments: Wallenberg's Syndrome with left lateropulsion Required Braces or Orthoses: No Restrictions Weight Bearing Restrictions: No Other Position/Activity Restrictions: Severe left bias in sitting and standing.  Worsens with activity  Short Term Goals: OT Short Term Goal 1: Patient will transfer into shower with min assist OT Short Term Goal 2: Patient will dress lower body with set up assist OT Short Term Goal 3: Patient will transfer to toilet with min assist OT Short Term Goal 4: Patient will shower with supervision  Skilled Therapeutic Interventions/Progress Updates: B/D in room shower with focus on balance and focusing visually in order to complete self care       Pain Assessment Pain Score:   4/10 temporal/ front aspect of heada/RN had gave meds  Therapy/Group: Individual Therapy  Bud Face [facility] 10/08/2011, 1:50 PM

## 2011-10-08 NOTE — Progress Notes (Signed)
Patient ID: Connor [name], male   DOB: Jul 13, 1946, 65 y.o.   MRN: [id number]  Subjective/Complaints: .  Blurring of vision may be increased  Review of Systems  Neurological: Positive for tingling, sensory change and speech change.  All other systems reviewed and are negative. +diplopia Constitutional: He is oriented to person, place, and time. He appears well-developed.  HENT:  Head: Normocephalic.  Neck: Normal range of motion. Neck supple. No thyromegaly present.  Cardiovascular: Normal rate and regular rhythm.  Pulmonary/Chest: Effort normal. No respiratory distress.  Abdominal: He exhibits no distension. There is no guarding.  Musculoskeletal: He exhibits no edema.  Neurological: He is alert and oriented to person, place, and time. He has normal reflexes. A cranial nerve deficit and sensory deficit is present.  Patient with left ptosis and mild left-sided facial sensory loss. LUE limb ataxia resolved , He did have double vision which clears with eye closure although no gross disconjugate gaze is seen. No nystagmus was appreciated. Patient did not experience vertigo. He had mild right arm and leg sensory loss to pain and temperature. Strength is grossly 4-4+ out of 5 bilaterally. Cognitively he is intact.  Skin: Skin is warm and dry.  Psychiatric: He has a normal mood and affect   Objective: Vital Signs: Blood pressure 157/94, pulse 85, temperature 97.8 F (36.6 C), temperature source Oral, resp. rate 20, height 5\' 11"  (1.803 m), weight 87.5 kg (192 lb 14.4 oz), SpO2 100.00%. No results found. Results for orders placed during the hospital encounter of 09/30/11 (from the past 72 hour(s))  PHENYTOIN LEVEL, TOTAL     Status: Abnormal   Collection Time   10/04/11  5:47 AM      Component Value Range Comment   Phenytoin Lvl 20.1 (*) 10.0 - 20.0 (ug/mL)        Assessment/Plan: 1. 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 Physiatrist is providing close team supervision and 24 hour management of active medical problems listed below. Physiatrist and rehab team continue to assess barriers to discharge/monitor patient progress toward functional and medical goals.  Cognition: Cognition Orientation Level: Oriented X4 Cognition Orientation Level: Oriented X4  2. Anticoagulation/DVT prophylaxis with Pharmaceutical: Lovenox 3. Pain Management: May need gabapentin if develops dysesthesia Medical Problem List and Plan:  1. 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 Continue Antivert as needed for dizziness. May need vestibular evaluation  2.. DVT Prophylaxis/Anticoagulation: ASA/SCD/Support hose. Monitor for increased signs of deep vein thrombosis  3. Dysphasia-presently on dysphagia to thin liquid diet. Speech therapy a followup advance diet as tolerated. Monitor for any signs of aspiration  4. Seizure disorder-continue Lamictal 100 mg in the a.m. and 200 mg at bedtime. Monitor closely after recent seizure.  5. Mood-provide emotional support and positive reinforcement.  6. History of alcohol abuse-provide counseling 7.  Diplopia/visual def increased but other CVA deficits decreased will decrease meclizine   1  Dalores Weger E 10/06/2011, 6:12 AM

## 2011-10-09 MED ORDER — MECLIZINE HCL 12.5 MG PO TABS
12.5000 mg | ORAL_TABLET | Freq: Three times a day (TID) | ORAL | Status: DC | PRN
  Filled 2011-10-09: qty 1

## 2011-10-09 NOTE — Progress Notes (Signed)
Physical Therapy Session Note  Patient Details  Name: Connor [name] MRN: [id number] Date of Birth: 1945-12-10  Today's Date: 10/09/2011 Time: 1405-1500 Time Calculation (min): 55 min  Precautions: Precautions Precautions: Fall Precaution Comments: Wallenberg's Syndrome with left lateropulsion Required Braces or Orthoses: No Restrictions Weight Bearing Restrictions: No Other Position/Activity Restrictions: Severe left bias in sitting and standing.  Worsens with activity  Short Term Goals: PT Short Term Goal 1: Patient will consistently transfer with min assist. PT Short Term Goal 1 - Progress: Progressing toward goal PT Short Term Goal 2: Patient will propel W/C > 150 feet with supervision, controlled environment. PT Short Term Goal 2 - Progress: Progressing toward goal PT Short Term Goal 3: Patient will maintain dynamic sitting balance with supervision > 5 minutes during functional task. PT Short Term Goal 3 - Progress: Progressing toward goal PT Short Term Goal 4: Patient will gait 75 feet with min assist, LRAD. PT Short Term Goal 4 - Progress: Progressing toward goal PT Short Term Goal 5: Patient will negotiate 12 steps with 1 rail min assist. PT Short Term Goal 5 - Progress: Progressing toward goal  Skilled Therapeutic Interventions/Progress Updates:  No c/o pain Pt participated in group PT session to increase social interaction and motivation.  See FIM for functional transfer details Gait 1x15', 1x40' with RW and Mod A for R weight shift Ball kicking in standing with 1 HHA and 1 hand on rail, focusing on shifting weight to R.  Seated and standing cone taps with L LE for smooth motor pattern and R weight shift. Standing in // bars step-taps to 6' step and step-ups with L/R LE for pre-gait weight shifting and eccentric control. Squats, heel raises, and L hip ABD, focusing on alignment and weigh-shifting, progressing from bil UE to R UE support only.  Pt propelled WC 100' with  S.     Therapy/Group: Group Therapy  Iona Coach 10/09/2011, 3:39 PM

## 2011-10-09 NOTE — Progress Notes (Signed)
Patient ID: Connor [name], male   DOB: 07-Jul-1946, 65 y.o.   MRN: [id number]  Subjective/Complaints: .  Blurring of vision may be increased both with eye open or closed  Headache L side neck pain  Review of Systems  Neurological: Positive for tingling, sensory change and speech change.  All other systems reviewed and are negative. +diplopia Constitutional: He is oriented to person, place, and time. He appears well-developed.  HENT:  Head: Normocephalic.  Neck: Normal range of motion. Neck supple. No thyromegaly present.  Cardiovascular: Normal rate and regular rhythm.  Pulmonary/Chest: Effort normal. No respiratory distress.  Abdominal: He exhibits no distension. There is no guarding.  Musculoskeletal: He exhibits no edema. L upper trap tender Neurological: He is alert and oriented to person, place, and time. He has normal reflexes. A cranial nerve deficit and sensory deficit is present.  Patient with left ptosis and mild left-sided facial sensory loss. LUE limb ataxia resolved , He did have double vision which doesn't clear with eye closure although no gross disconjugate gaze is seen. No nystagmus was appreciated. Patient did not experience vertigo. He had mild right arm and leg sensory loss to pain and temperature. Strength is grossly 4-4+ out of 5 bilaterally. Cognitively he is intact.  Skin: Skin is warm and dry.  Psychiatric: He has a normal mood and affect   Objective: Vital Signs: Blood pressure 157/94, pulse 85, temperature 97.8 F (36.6 C), temperature source Oral, resp. rate 20, height 5\' 11"  (1.803 m), weight 87.5 kg (192 lb 14.4 oz), SpO2 100.00%. No results found. Results for orders placed during the hospital encounter of 09/30/11 (from the past 72 hour(s))  PHENYTOIN LEVEL, TOTAL     Status: Abnormal   Collection Time   10/04/11  5:47 AM      Component Value Range Comment   Phenytoin Lvl 20.1 (*) 10.0 - 20.0 (ug/mL)        Assessment/Plan: 1. Functional deficits  secondary to L Wallenburg syndrome which require 3+ hours per day of interdisciplinary therapy in a comprehensive inpatient rehab setting. Physiatrist is providing close team supervision and 24 hour management of active medical problems listed below. Physiatrist and rehab team continue to assess barriers to discharge/monitor patient progress toward functional and medical goals.  Cognition: Cognition Orientation Level: Oriented X4 Cognition Orientation Level: Oriented X4  2. Anticoagulation/DVT prophylaxis with Pharmaceutical: Lovenox 3. Pain Management: May need gabapentin if develops dysesthesia Medical Problem List and Plan:  1. 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 Continue Antivert as needed for dizziness. May need vestibular evaluation  2.. DVT Prophylaxis/Anticoagulation: ASA/SCD/Support hose. Monitor for increased signs of deep vein thrombosis  3. Dysphasia-presently on dysphagia to thin liquid diet. Speech therapy a followup advance diet as tolerated. Monitor for any signs of aspiration  4. Seizure disorder-continue Lamictal 100 mg in the a.m. and 200 mg at bedtime. Monitor closely after recent seizure.  5. Mood-provide emotional support and positive reinforcement.  6. History of alcohol abuse-provide counseling 7.  Diplopia/visual def increased but other CVA deficits decreased will decrease meclizine   1  Kerryann Allaire E 10/06/2011, 6:12 AM

## 2011-10-09 NOTE — Progress Notes (Signed)
Patient stated he had already taken his bath at sink. No k-pad available at this time on list for one.

## 2011-10-09 NOTE — Progress Notes (Signed)
Rt cva,weak on left . Regular diet. Prefers meds whole in apple sauce. HOH wears aids uses urnal staff empties. Bed alarm for safety . See fim for bathing and dressing.

## 2011-10-10 NOTE — Progress Notes (Signed)
Progress Notes  Speech Language Pathology Therapy Note  Patient Details  Name: Connor [name] MRN: [id number] Date of Birth: 1946-02-07  Today's Date: 10/10/2011 Time: 4098-1191 Time Calculation (min): 33 min  Precautions: Precautions Precautions: Fall Precaution Comments: Wallenberg's Syndrome with left lateropulsion Required Braces or Orthoses: No Restrictions Weight Bearing Restrictions: No Other Position/Activity Restrictions: Severe left bias in sitting and standing.  Worsens with activity  Objective:  Pt. seen for dysphagia treatment.  Pt. Recalled swallow precautions with 80% accuracy without cues given.  Observed with cup sips water with several throat clears (also clears throat frequently at baseline), therefore, difficult to accurately determine if penetration/aspiration is present.  Vital signs are stable with temp: 97.9 and lung sounds WDL's  Breck Coons [employer] [phone]  10/10/2011

## 2011-10-10 NOTE — Progress Notes (Signed)
Per State Regulation 482.30 This chart was reviewed for medical necessity with respect to the patient's Admission/Duration of stay. Pt continues to participate in therapies with steady progress. Pt with visual deficits. Adjusting Meclizine and Phenergan.   [name]                 Nurse Care Manager             Next Review Date: 10/12/11

## 2011-10-10 NOTE — Progress Notes (Signed)
Patient ID: Connor [name], male   DOB: 1946-01-14, 65 y.o.   MRN: [id number]  Subjective/Complaints: .  Blurring of vision may be increased both eyes open.  Headache L side neck pain  Review of Systems  Neurological: Positive for tingling, sensory change and speech change.  All other systems reviewed and are negative. +diplopia Constitutional: He is oriented to person, place, and time. He appears well-developed.  HENT:  Head: Normocephalic.  Neck: Normal range of motion. Neck supple. No thyromegaly present.  Cardiovascular: Normal rate and regular rhythm.  Pulmonary/Chest: Effort normal. No respiratory distress.  Abdominal: He exhibits no distension. There is no guarding.  Musculoskeletal: He exhibits no edema. L upper trap tender Neurological: He is alert and oriented to person, place, and time. He has normal reflexes. A cranial nerve deficit and sensory deficit is present.  Patient with left ptosis and mild left-sided facial sensory loss. LUE limb ataxia resolved , He did have double visionclear with Left lateral gaze although no gross disconjugate gaze is seen. No nystagmus was appreciated. Patient did not experience vertigo. He had mild right arm and leg sensory loss to pain and temperature. Strength is grossly 4-4+ out of 5 bilaterally. Cognitively he is intact.  Skin: Skin is warm and dry.  Psychiatric: He has a normal mood and affect   Objective: Vital Signs: Blood pressure 157/94, pulse 85, temperature 97.8 F (36.6 C), temperature source Oral, resp. rate 20, height 5\' 11"  (1.803 m), weight 87.5 kg (192 lb 14.4 oz), SpO2 100.00%. No results found. Results for orders placed during the hospital encounter of 09/30/11 (from the past 72 hour(s))  PHENYTOIN LEVEL, TOTAL     Status: Abnormal   Collection Time   10/04/11  5:47 AM      Component Value Range Comment   Phenytoin Lvl 20.1 (*) 10.0 - 20.0 (ug/mL)        Assessment/Plan: 1. 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 Physiatrist is providing close team supervision and 24 hour management of active medical problems listed below. Physiatrist and rehab team continue to assess barriers to discharge/monitor patient progress toward functional and medical goals.  Cognition: Cognition Orientation Level: Oriented X4 Cognition Orientation Level: Oriented X4  2. Anticoagulation/DVT prophylaxis with Pharmaceutical: Lovenox 3. Pain Management: May need gabapentin if develops dysesthesia Medical Problem List and Plan:  1. 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 Continue Antivert as needed for dizziness. May need vestibular evaluation  2.. DVT Prophylaxis/Anticoagulation: ASA/SCD/Support hose. Monitor for increased signs of deep vein thrombosis  3. Dysphasia-presently on dysphagia to thin liquid diet. Speech therapy a followup advance diet as tolerated. Monitor for any signs of aspiration  4. Seizure disorder-continue Lamictal 100 mg in the a.m. and 200 mg at bedtime. Monitor closely after recent seizure.  5. Mood-provide emotional support and positive reinforcement.  6. History of alcohol abuse-provide counseling 7.  Diplopia/visual def increased but other CVA deficits decreased will dc meclizine   1  Jaideep Pollack E 10/06/2011, 6:12 AM

## 2011-10-10 NOTE — Progress Notes (Signed)
Alert, oriented, calls appropriately and expresses needs.  Reporting dizziness, new eye patch today (rotate q2 hrs) stand pivot transfers lft side weak hx of lft UE ataxia.  No loss of balance noted with ambulation.  repoted HA , managed with prn tylenol.  HTN reported to PA, although pt did not have HA with elevated BP this afternoon.  Continue to monitor BP for now, no parameters given.  Report that pt can be impulsive however he had called appropriately, using urinal at nights, continent of bladder using toilet during the day.  Pt manages clothes per self. No other changes noted, cont plan of care. Pamelia Hoit

## 2011-10-10 NOTE — Progress Notes (Signed)
Occupational Therapy Note  Patient Details  Name: Connor [name] MRN: [id number] Date of Birth: 1946/02/20 Today's Date: 10/10/2011  Time: 1430 - 1530 60 min group Pain - none  Skilled Intervention: Participated in TAG group with focus on visual fixation and scanning during functional dynamic standing activity to prepare item for Christmas Party. Pt pushes toward L side and responds well to tactile cues to have pt keep self against cue on right hip. Pt using RW to ambulate. Began oculomotor AROM ex in addition to convergence exercises using gaze stabilization.  Group session  Connor [name],Connor [name] 10/10/2011, 3:54 PM

## 2011-10-10 NOTE — Progress Notes (Addendum)
Occupational Therapy Session Note  Patient Details  Name: Connor [name] MRN: [id number] Date of Birth: [birth year]  Today's Date: 10/10/2011 Time:  - 7:41-8:22am    Precautions: Precautions Precautions: Fall Precaution Comments: Wallenberg's Syndrome with left lateropulsion Required Braces or Orthoses: No Restrictions Weight Bearing Restrictions: No Other Position/Activity Restrictions: Severe left bias in sitting and standing.  Worsens with activity  Short Term Goals: OT Short Term Goal 1: Patient will transfer into shower with min assist OT Short Term Goal 2: Patient will dress lower body with set up assist OT Short Term Goal 3: Patient will transfer to toilet with min assist OT Short Term Goal 4: Patient will shower with supervision    Pain Pain Assessment c/o Left sided Head Ache Pain Score:   6-7/10 prior to treatment RN notified & gave meds; 5/10 @ end of session  Skilled Therapeutic Interventions/Progress Updates   Pt seen for B/D @ shower level w/ focus on transfers to/from shower chair, w/c. Pt cont to demonstrate left lean/bias in sitting and standing noted. Grooming sitting at sink. Pt noted to close Right eye during activities secondary to continued c/o double vision. Pt may benefit from eye patch and alternating patch over L & R eye. Discussed this w/ pt.  See FIM for ADL/selfcare level during today's session.      Therapy/Group: Individual Therapy  Alm Bustard 10/10/2011, 8:25 AM

## 2011-10-10 NOTE — Progress Notes (Signed)
Occupational Therapy Note  Patient Details  Name: Connor [name] MRN: [id number] Date of Birth: April 17, 1946 Today's Date: 10/10/2011 Time: 12:55-1:28pm  Pain: 3/10 L "headache"; pt reports being premedicated prior to OT session & RN aware.  Skilled OT Intervention: Pt seen this afternoon in OT w/ focus on visual tracking exercises, perceptual/problem solving while ambulating in room w/ Min A w/ RW. Toileting in bathroom w/ Min A (all aspects) and min VC's secondary to narrow/wide base of stance noted during functional activities. Issued eye patch and pt educated verbally to use approx. 1 hour on, then switch eyes secondary to c/o diplopia. Pt wore patch over right eye throughout pm session. Pt noted to tilt head to right during tracking/scanning activities w/ left, able to correct w/ Min VC's. Cont toward plan of care & goals.   Mariam Dollar Beth Dixon 10/10/2011, 1:30 PM

## 2011-10-10 NOTE — Progress Notes (Signed)
Physical Therapy Session Note  Patient Details  Name: Connor [name] MRN: [id number] Date of Birth: November 25, 1945  Today's Date: 10/10/2011 Time: 3295-1884 Time Calculation (min): 41 min  Precautions: Precautions Precautions: Fall Precaution Comments: Wallenberg's Syndrome with left lateropulsion Required Braces or Orthoses: No Restrictions Weight Bearing Restrictions: No Other Position/Activity Restrictions: Severe left bias in sitting and standing.  Worsens with activity  Short Term Goals: PT Short Term Goal 1: Patient will consistently transfer with min assist. PT Short Term Goal 1 - Progress: Progressing toward goal PT Short Term Goal 2: Patient will propel W/C > 150 feet with supervision, controlled environment. PT Short Term Goal 2 - Progress: Progressing toward goal PT Short Term Goal 3: Patient will maintain dynamic sitting balance with supervision > 5 minutes during functional task. PT Short Term Goal 3 - Progress: Progressing toward goal PT Short Term Goal 4: Patient will gait 75 feet with min assist, LRAD. PT Short Term Goal 4 - Progress: Progressing toward goal PT Short Term Goal 5: Patient will negotiate 12 steps with 1 rail min assist. PT Short Term Goal 5 - Progress: Progressing toward goal  Pain Pain Assessment Pain Assessment: No/denies pain Pain Score: Asleep   Other Treatments Treatments Therapeutic Activity: Transfer training to facilitate increased weight shifting and maintain WB through RLE during squat pivots to R side bed > w/c <> mat with min A and visual and verbal cues for sequence.   Neuromuscular Facilitation: Right;Activity to increase sustained activation;Activity to increase lateral weight shifting during sit to stand x 10 reps with bilat UE support on R thigh and anterior lean over RLE with min A; performed 5 stairs with RUE support with focus on weight shifting to R side during functional task with mod A; lateral weight shifting to R and  maintaining R stance during L foot taps to 4 inch step x 10 reps and maintaining COG in midline over BOS while standing on [place] (use of foot pedals as feedback for which side patient's weight shifted towards) with verbal, visual and tactile cues to maintain lateral weight shift to R side; with bilat UE support patient using RUE to pull self to R side; without UE support patient still with poor voluntary weight shift of COG to R; patient reports it still feels as if he is going to fall to R side.    Therapy/Group: Individual Therapy  [name] 10/10/2011, 12:15 PM

## 2011-10-10 NOTE — Progress Notes (Signed)
Social Work Patient ID: Connor [name], male   DOB: 1946/05/18, 65 y.o.   MRN: [id number]  Met with pt to discuss discharge plans and the need for 24 hour supervision.  He wants to talk to his son and then talk To worker tomorrow regarding discharge and the arrangements.  Discussed follow therapies also aware of home health And outpatient therapies.  Will need to schedule family education for this week.  Continue to work toward discharge 12/24.

## 2011-10-11 DIAGNOSIS — G8110 Spastic hemiplegia affecting unspecified side: Secondary | ICD-10-CM

## 2011-10-11 DIAGNOSIS — I6330 Cerebral infarction due to thrombosis of unspecified cerebral artery: Secondary | ICD-10-CM

## 2011-10-11 DIAGNOSIS — Z5189 Encounter for other specified aftercare: Secondary | ICD-10-CM

## 2011-10-11 MED ORDER — AMLODIPINE BESYLATE 5 MG PO TABS
5.0000 mg | ORAL_TABLET | Freq: Every day | ORAL | Status: DC
  Administered 2011-10-11 – 2011-10-17 (×7): 5 mg via ORAL
  Filled 2011-10-11 (×8): qty 1

## 2011-10-11 MED ORDER — TRAMADOL HCL 50 MG PO TABS
50.0000 mg | ORAL_TABLET | Freq: Four times a day (QID) | ORAL | Status: DC | PRN
  Administered 2011-10-11 – 2011-10-17 (×11): 50 mg via ORAL
  Filled 2011-10-11 (×11): qty 1

## 2011-10-11 NOTE — Progress Notes (Signed)
Patient has concerns regarding blood pressure control.  Deatra Ina, PA notified of patient concerns.  No new orders received at this time.  Will continue to monitor.

## 2011-10-11 NOTE — Progress Notes (Signed)
Physical Therapy Session Note  Patient Details  Name: Connor [name] MRN: [id number] Date of Birth: 06-24-1946  Today's Date: 10/11/2011 Time: 1100 - 1200 Time Calculation (min): 60 min  Precautions: Precautions Precautions: Fall Precaution Comments: Wallenberg's Syndrome with left lateropulsion Required Braces or Orthoses: No Restrictions Weight Bearing Restrictions: No Other Position/Activity Restrictions: Severe left bias in sitting and standing.  Worsens with activity  Short Term Goals: PT Short Term Goal 1: Patient will consistently transfer with min assist. PT Short Term Goal 1 - Progress: Progressing toward goal PT Short Term Goal 2: Patient will propel W/C > 150 feet with supervision, controlled environment. PT Short Term Goal 2 - Progress: Progressing toward goal PT Short Term Goal 3: Patient will maintain dynamic sitting balance with supervision > 5 minutes during functional task. PT Short Term Goal 3 - Progress: Progressing toward goal PT Short Term Goal 4: Patient will gait 75 feet with min assist, LRAD. PT Short Term Goal 4 - Progress: Progressing toward goal PT Short Term Goal 5: Patient will negotiate 12 steps with 1 rail min assist. PT Short Term Goal 5 - Progress: Progressing toward goal  Skilled Therapeutic Interventions/Progress Updates:     General Chart Reviewed: Yes Family/Caregiver Present: No   Pain Pain Assessment Pain Assessment: 0-10 Pain Score:   3 Pain Type: Acute pain Pain Location: Head Pain Orientation: Posterior Pain Descriptors: Aching Pain Onset: On-going Patients Stated Pain Goal: 0 Pain Intervention(s): RN made aware;Emotional support;Other (Comment) (rest breaks prn, pre-medicated) Multiple Pain Sites: No  Other Treatments  W/C mobility to and from gym with bilat LEs, focus on graded left LE movement and symmetry. Sit to squat with sidestepping right for increased active right weight shift min assist, followed by sit to squat  with sidestepping to the right then sidestepping to the left, focus on graded left LE movement and weight shift. Progressed to sit to squat position with one side step left, one side step right then standing with sidestepping right then sidestepping left min-mod assist, focus on active right lateral weight shift and graded left lateral weight shift. Gait with RW 25 feet x 3 min assist with manual facilitation at right pelvis to increase right weight shift in right stance phase. Much improved bilat weight shift and graded left LE movement at end of session.  Therapy/Group: Individual Therapy  [name] 10/11/2011, 12:24 PM

## 2011-10-11 NOTE — Progress Notes (Signed)
Patient is alert and oriented x3. He has been medicated for a headache x3 which only provided minimal relief. MD is aware and ordered Ultram. Patient has been continent of bowel and bladder. Continue current plan of care.

## 2011-10-11 NOTE — Progress Notes (Signed)
OccupationalTherapy Note  Patient Details  Name: Connor [name] MRN: [id number] Date of Birth: [birth year] Today's Date: 10/11/2011 Time: 1000 - 1100 60 min  Pain - c/o left temporal HA. nsg notified. Pt unable to take meds at this time.  Skilled intervention: ADL retraining @ shower level with overall supervision. Pt using grab bars to assist with balance. Bath completed mostly in sitting to increase safety. Using RW for ambulation without eye patch. Pt instructed to only wear patch as needed and not for ambulation. Focus of session on postural control and midline orientation during ADL. Also focus of postural control using neuro reed in 4 pt and tall kneeling. Also using rythmic stabilization to increase trunk control. Visual fixation/ gaze stabilization ex, oculomotor saccadic and smooth pursuits. Good participation.  Individual session   Nikolina Simerson,HILLARY 10/11/2011, 12:51 PM

## 2011-10-11 NOTE — Progress Notes (Signed)
Physical Therapy Session Note  Patient Details  Name: Connor [name] MRN: [id number] Date of Birth: [birth year]  Today's Date: 10/11/2011 Time: 7829-5621 Time Calculation (min): 39 min  Precautions: Precautions Precautions: Fall Precaution Comments: Wallenberg's Syndrome with left lateropulsion Required Braces or Orthoses: No Restrictions Weight Bearing Restrictions: No Other Position/Activity Restrictions: Severe left bias in sitting and standing.  Worsens with activity  Short Term Goals: PT Short Term Goal 1: Patient will consistently transfer with min assist. PT Short Term Goal 1 - Progress: Progressing toward goal PT Short Term Goal 2: Patient will propel W/C > 150 feet with supervision, controlled environment. PT Short Term Goal 2 - Progress: Progressing toward goal PT Short Term Goal 3: Patient will maintain dynamic sitting balance with supervision > 5 minutes during functional task. PT Short Term Goal 3 - Progress: Progressing toward goal PT Short Term Goal 4: Patient will gait 75 feet with min assist, LRAD. PT Short Term Goal 4 - Progress: Progressing toward goal PT Short Term Goal 5: Patient will negotiate 12 steps with 1 rail min assist. PT Short Term Goal 5 - Progress: Progressing toward goal  Pain Pain Assessment Pain Assessment: 0-10 Pain Score:   3 Pain Type: Neuropathic pain Pain Location: Head Pain Orientation: Left Pain Descriptors: Aching;Constant;Discomfort Patients Stated Pain Goal: 0 Pain Intervention(s): RN made aware  Locomotion  Wheelchair Mobility Distance: 150 supervision Gait training with RW x 100' with min A and manual facilitation at pelvis and ribs for lateral and anterior weight shifts onto stance LE with improved cadence, step and stride length.  Other Treatments  Demonstrated to patient how to perform car transfer with RW; patient gave repeat demonstration with RW and supervision for bring LE in and out of car.  Weight shift training  with R lateral hip bumps to sink counter with RUE support on counter for border and therapist hand at L ribs for border for L lateral weight shift; increased to full R lateral leans to touch R shoulder to wall and back to midline with min A and no UE support; added in LLE forwards and retro steps with weight shift while performing anterior and lateral weight shifts to LLE.  Tolerated well with no LOB to L side.    Therapy/Group: Individual Therapy  [name] 10/11/2011, 5:25 PM

## 2011-10-11 NOTE — Progress Notes (Signed)
Speech Pathology: Dysphagia Treatment Note & Discharge Summary Patient Details  Name: Connor [name]  MRN: [id number]  Date of Birth: 1946-02-28   Individual Session  [id number]  Patient was observed with : Thin liquids.  Patient was noted to have s/s of aspiration : Yes:  Intermittent throat clears  Lung Sounds:  WNL Temperature: WNL  Patient required: no cues to consistently follow precautions/strategies  Clinical Impression: Patient is modified independent with use of swallowing compensatory strategies: frequent use of liquid wash, hard effortful swallows and intermittent throat clears.    Recommendations:  Patient modified independent and all goals met; as a result patient discharged today.  Pain:   6 /10 Intervention Required:   No RN notified and already provided medication per patient report  Short Term Goals:  1. Patient will consume recommended diet without observed clinical signs of aspiration with: Modified independent assistance  2. 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  As a result, patient participated in dysphagia education and therapy that focused on compensating for premorbid deficits, which were compounded by new CVA.  Patient modified independent with use of strategies without any changes in lung function or temperature; as a result skilled SLP services are no longer warranted.      Reasons goals not met: n/a  Equipment acquired: none  Reasons for discharge: treatment goals met  Follow-up: None needed at this time  Patient agrees with progress made and goals achieved: Yes  Connor [name], M.A.,  CCC-SLP [phone] Connor [name] 10/11/2011

## 2011-10-11 NOTE — Progress Notes (Signed)
Social Work Patient ID: Connor [name], male   DOB: 08/11/1946, 65 y.o.   MRN: [id number]  Met with pt to discuss discharge plans. He will require 24 hour supervision. His question is how long? Will discuss with team tomorrow and discuss with his boys.  Informed him of options: hired assist or short term NHP. Informed [facility] only offers OPPT no other therapies.  Discuss with team and his family team's recommendations.

## 2011-10-11 NOTE — Progress Notes (Signed)
Patient ID: ELJAY LAVE, male   DOB: October 30, 1945, 65 y.o.   MRN: [id number]  Subjective/Complaints: .  Blurring of vision overall improved off antivert.  Headache L side neck pain  Review of Systems  Neurological: Positive for tingling, sensory change and speech change.  All other systems reviewed and are negative. +diplopia Constitutional: He is oriented to person, place, and time. He appears well-developed.  HENT:  Head: Normocephalic.  Neck: Normal range of motion. Neck supple. No thyromegaly present.  Cardiovascular: Normal rate and regular rhythm.  Pulmonary/Chest: Effort normal. No respiratory distress.  Abdominal: He exhibits no distension. There is no guarding.  Musculoskeletal: He exhibits no edema. L upper trap tender Neurological: He is alert and oriented to person, place, and time. He has normal reflexes. A cranial nerve deficit and sensory deficit is present.  Patient with left ptosis and mild left-sided facial sensory loss. LUE limb ataxia resolved , He did have double visionclear with Left lateral gaze although no gross disconjugate gaze is seen. No nystagmus was appreciated. Patient did not experience vertigo. He had mild right arm and leg sensory loss to pain and temperature. Strength is grossly 4-4+ out of 5 bilaterally. Cognitively he is intact.  Skin: Skin is warm and dry.  Psychiatric: He has a normal mood and affect   Objective: Vital Signs: Blood pressure 157/94, pulse 85, temperature 97.8 F (36.6 C), temperature source Oral, resp. rate 20, height 5\' 11"  (1.803 m), weight 87.5 kg (192 lb 14.4 oz), SpO2 100.00%. No results found. Results for orders placed during the hospital encounter of 09/30/11 (from the past 72 hour(s))  PHENYTOIN LEVEL, TOTAL     Status: Abnormal   Collection Time   10/04/11  5:47 AM      Component Value Range Comment   Phenytoin Lvl 20.1 (*) 10.0 - 20.0 (ug/mL)        Assessment/Plan: 1. 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 Physiatrist is providing close team supervision and 24 hour management of active medical problems listed below. Physiatrist and rehab team continue to assess barriers to discharge/monitor patient progress toward functional and medical goals.  Cognition: Cognition Orientation Level: Oriented X4 Cognition Orientation Level: Oriented X4  2. Anticoagulation/DVT prophylaxis with Pharmaceutical: Lovenox 3. Pain Management: May need gabapentin if develops dysesthesia Medical Problem List and Plan:  1. 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 Continue Antivert as needed for dizziness. May need vestibular evaluation  2.. DVT Prophylaxis/Anticoagulation: ASA/SCD/Support hose. Monitor for increased signs of deep vein thrombosis  3. Dysphasia-presently on dysphagia to thin liquid diet. Speech therapy a followup advance diet as tolerated. Monitor for any signs of aspiration  4. Seizure disorder-continue Lamictal 100 mg in the a.m. and 200 mg at bedtime. Monitor closely after recent seizure.  5. Mood-provide emotional support and positive reinforcement.  6. History of alcohol abuse-provide counseling 7.  Diplopia/visual def increased but other CVA deficits decreased will dc meclizine 8.  HTN was on exforge at home.  Will start amlodipine 5mg  today and monitor  1  Chika Cichowski E 10/06/2011, 6:12 AM

## 2011-10-12 NOTE — Patient Care Conference (Signed)
Inpatient RehabilitationTeam Conference Note Date: 10/12/2011   Time: 11:10 AM    Patient Name: Connor [name]      Medical Record Number: [id number]  Date of Birth: 1946-04-05 Sex: Male         Room/Bed: 4146/4146-01 Payor Info: Payor: [employer] [place] MEDICARE  Plan: BLUE MEDICARE [facility]  Product Type: *No Product type*     Admitting Diagnosis: LT CVA  Admit Date/Time:  10/05/2011  2:38 PM Admission Comments: No comment available   Primary Diagnosis:  CVA (cerebral infarction) Principal Problem: CVA (cerebral infarction)  Patient Active Problem List  Diagnoses Date Noted  . Hyperlipidemia LDL goal < 100 10/05/2011  . 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 CVA (cerebral infarction) 10/05/2011  . Stroke, acute, embolic 09/30/2011  . HTN (hypertension) 09/30/2011  . Seizure disorder 09/30/2011    Expected Discharge Date: Expected Discharge Date: 10/24/11  Team Members Present: Physician: Dr. Claudette Laws Case Manager Present: Lutricia Horsfall, RN Social Worker Present: Dossie Der, LCSW PT Present: Illene Bolus, PT OT Present: Bretta Bang, OT;Hilary Ward, OT Nursing: Guido Sander, RN     Current Status/Progress Goal Weekly Team Focus  Medical   HTN uncontrolled  Systolic BP<140, [id number]<782  adjust BP meds   Bowel/Bladder   Continent of bowel and bladder.  Uses a urinal at night.  Continent of bowel and bladder.      Swallow/Nutrition/ Hydration             ADL's   supervision bathing dressing and Min A functional transfers  Mod I  postural control pt education. vision exercises   Mobility   min assist transfers, supervision W/C mobility, min assist 25 feet gait with RW, min assist stair negotiation  supervision transfers, modified independent W/C mobility, min assist 100 feet gait with RW  decreased left lateropulsion, decreased assist transfers, graded bilat weight shifts   Communication             Safety/Cognition/ Behavioral Observations      Pain   Headache-managed with tylenol and ultram.  Pain kept </=2      Skin   No skin issues noted.  No skin breakdown.         *See Interdisciplinary Assessment and Plan and progress notes for long and short-term goals  Barriers to Discharge: BP, balance    Possible Resolutions to Barriers:  Med adjustment and PT/OT    Discharge Planning/Teaching Needs:  No set discharge plan.  Discussing with son's and question how long someone needs to be with him. Aware of options.      Team Discussion:  Adjusting BP meds. Good appetite. Continent. Double/blurred vision. Fatigues easily. Amb with min A. Ataxia noted. Pt will not have 24/7 S at home. Team decided to extend LOS to upgrade goal to mod I  at w/c level.   Revisions to Treatment Plan:  Extend LOS, upgrade goals.   Continued Need for Acute Rehabilitation Level of Care: The patient requires daily medical management by a physician with specialized training in physical medicine and rehabilitation for the following conditions: Daily direction of a multidisciplinary physical rehabilitation program to ensure safe treatment while eliciting the highest outcome that is of practical value to the patient.: Yes Daily medical management of patient stability for increased activity during participation in an intensive rehabilitation regime.: Yes Daily analysis of laboratory values and/or radiology reports with any subsequent need for medication adjustment of medical intervention for : Neurological problems  [name] 10/12/2011,  11:19 AM

## 2011-10-12 NOTE — Progress Notes (Signed)
Min Assist walker. Pain controlled by current medication regemin. No skin problems. Good appetite. Continent of bowels and bladder. No poor safety practices noted. Participating with therapies. Continue plan of care.

## 2011-10-12 NOTE — Progress Notes (Signed)
Occupational Therapy Note  Patient Details  Name: Connor [name] MRN: [id number] Date of Birth: Jan 07, 1946 Today's Date: 10/12/2011 Time: 1305 - 1335 30 min Pain - none  Skilled Intervention: Focus of session on neuro rehab with postural control using PNF techniques in standing to facilitate midline orientation and wt shifts to R and decrease left lateral push. Sit - stand with B hands on R knee to wt shift to R. Unstable surfac under left foot to wt shift to R standing at sink. Reports vision is improving.  Individual session   Aprille Sawhney,HILLARY 10/12/2011, 4:18 PM

## 2011-10-12 NOTE — Progress Notes (Signed)
Physical Therapy Weekly Progress Note  Patient Details  Name: Connor [name] MRN: [id number] Date of Birth: 12/13/1945  Today's Date: 10/12/2011  Patient is a 65 y.o male admitted to [facility] s/p acute CVA. Patient has made good progress with PT this week, from mod assist transfer and total assist gait to current min assist transfers and min-mod assist gait. Patient has met 3 of 5 short term goals:   PT Short Term Goals PT Short Term Goal 1: Patient will consistently transfer with min assist. PT Short Term Goal 1 - Progress: Met PT Short Term Goal 2: Patient will propel W/C > 150 feet with supervision, controlled environment. PT Short Term Goal 2 - Progress: Met PT Short Term Goal 3: Patient will maintain dynamic sitting balance with supervision > 5 minutes during functional task. PT Short Term Goal 3 - Progress: Met PT Short Term Goal 4: Patient will gait 75 feet with min assist, LRAD. PT Short Term Goal 4 - Progress: Partly met PT Short Term Goal 5: Patient will negotiate 12 steps with 1 rail min assist. PT Short Term Goal 5 - Progress: Partly met  Visual deficits are contributing, in part, to level of assistance for balance. Also, as the left lateropulsion is decreasing bilat LE ataxia left > right is emerging resulting in slower progress than initially anticipated and current levels of assistance required at this time. Per case manager and social worker, patient's son may not be able to provide 24/7 assist at time of discharge. Team feels patient will make good progress and reach modified independent W/C level and supervision gait with LRAD if LOS is extended one week. As a result of this change of plan, long term goals have been upgraded to maximize patient functional independence for safe discharge home with intermittent supervision.  Patient will continue to benefit from skilled PT intervention to enhance overall performance with activity tolerance, balance, postural control, ability to  compensate for deficits and coordination.  Patient progressing toward new long term goals.  Continue plan of care.   [name] 10/12/2011, 5:03 PM

## 2011-10-12 NOTE — Progress Notes (Signed)
Patient ID: Connor [name], male   DOB: [birth year], 65 y.o.   MRN: [id number]  Subjective/Complaints: .  Blurring of vision overall improved off antivert.  Headache L side neck pain  Review of Systems  Neurological: Positive for tingling, sensory change and speech change.  All other systems reviewed and are negative. +diplopia Constitutional: He is oriented to person, place, and time. He appears well-developed.  HENT:  Head: Normocephalic.  Neck: Normal range of motion. Neck supple. No thyromegaly present.  Cardiovascular: Normal rate and regular rhythm.  Pulmonary/Chest: Effort normal. No respiratory distress.  Abdominal: He exhibits no distension. There is no guarding.  Musculoskeletal: He exhibits no edema. L upper trap tender Neurological: He is alert and oriented to person, place, and time. He has normal reflexes. A cranial nerve deficit and sensory deficit is present.  Patient with left ptosis and mild left-sided facial sensory loss. LUE limb ataxia resolved , He did have double visionclear with Left lateral gaze although no gross disconjugate gaze is seen. No nystagmus was appreciated. Patient did not experience vertigo. He had mild right arm and leg sensory loss to pain and temperature. Strength is grossly 4-4+ out of 5 bilaterally. Cognitively he is intact.  Skin: Skin is warm and dry.  Psychiatric: He has a normal mood and affect   Objective: Vital Signs: Blood pressure 149/103, pulse 85, temperature 97.8 F (36.6 C), temperature source Oral, resp. rate 20, height 5\' 11"  (1.803 m), weight 87.5 kg (192 lb 14.4 oz), SpO2 100.00%. No results found. Results for orders placed during the hospital encounter of 09/30/11 (from the past 72 hour(s))  PHENYTOIN LEVEL, TOTAL     Status: Abnormal   Collection Time   10/04/11  5:47 AM      Component Value Range Comment   Phenytoin Lvl 20.1 (*) 10.0 - 20.0 (ug/mL)        Assessment/Plan: 1. 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 Physiatrist is providing close team supervision and 24 hour management of active medical problems listed below. Physiatrist and rehab team continue to assess barriers to discharge/monitor patient progress toward functional and medical goals.  Cognition: Cognition Orientation Level: Oriented X4 Cognition Orientation Level: Oriented X4  2. Anticoagulation/DVT prophylaxis with Pharmaceutical: Lovenox 3. Pain Management: May need gabapentin if develops dysesthesia Medical Problem List and Plan:  1. 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 Continue Antivert as needed for dizziness. May need vestibular evaluation  2.. DVT Prophylaxis/Anticoagulation: ASA/SCD/Support hose. Monitor for increased signs of deep vein thrombosis  3. Dysphasia-presently on dysphagia to thin liquid diet. Speech therapy a followup advance diet as tolerated. Monitor for any signs of aspiration  4. Seizure disorder-continue Lamictal 100 mg in the a.m. and 200 mg at bedtime. Monitor closely after recent seizure.  5. Mood-provide emotional support and positive reinforcement.  6. History of alcohol abuse-provide counseling 7.  Diplopia/visual def increased but other CVA deficits decreased will dc meclizine 8.  HTN was on exforge at home.  On amlodipine 5mg  today and monitor increase to 10mg  if no better  1  KIRSTEINS,ANDREW E 10/06/2011, 6:12 AM

## 2011-10-12 NOTE — Progress Notes (Signed)
Per State Regulation 482.30 This chart was reviewed for medical necessity with respect to the patient's Admission/Duration of stay.  Met with pt to discuss team conference. Pt was not in agreement with extending his LOS. Explained rationale for date change. He reports that he will have 24/7 S. SW to contact family to discuss plans. [name]                 Nurse Care Manager             Next Review Date: 10/17/11

## 2011-10-12 NOTE — Progress Notes (Signed)
Social Work Patient ID: Connor [name], male   DOB: 03/07/1946, 65 y.o.   MRN: [id number]  Met with pt and spoke with Mark-pt's son to discuss team conference and discharge date of 12/31. Team feels he can Benefit from staying longer on rehab, since initial length of stay was short.  Pt really wants to go home for Christmas Discussed would talk with team and PA regarding medical issues.  Team feels if pt has 24 hour care then discharge Is feasible 12/24.  Have discussed with pt and he will discuss with son tonight, what works for both of them.   Pt aware if discharged 12/24 goals are wheelchair level and if stayed until 12/31 ambulatory level.  Will touch base with Pt tomorrow am regarding decision.

## 2011-10-12 NOTE — Progress Notes (Signed)
Physical Therapy Session Note  Patient Details  Name: BARTOSZ LUGINBILL MRN: [id number] Date of Birth: 12/12/1945  Today's Date: 10/12/2011 Time: 1115-1210 Time Calculation (min): 55 min  Precautions: Precautions Precautions: Fall Precaution Comments: Wallenberg's Syndrome with left leteropulsion Required Braces or Orthoses: No Restrictions Weight Bearing Restrictions: No Other Position/Activity Restrictions: Severe left bias in sitting and standing.  Worsens with activity  Short Term Goals: PT Short Term Goal 1: Patient will consistently transfer with min assist. PT Short Term Goal 1 - Progress: met PT Short Term Goal 2: Patient will propel W/C > 150 feet with supervision, controlled environment. PT Short Term Goal 2 - Progress: met PT Short Term Goal 3: Patient will maintain dynamic sitting balance with supervision > 5 minutes during functional task. PT Short Term Goal 3 - Progress: met PT Short Term Goal 4: Patient will gait 75 feet with min assist, LRAD. PT Short Term Goal 4 - Progress: Progressing toward goal PT Short Term Goal 5: Patient will negotiate 12 steps with 1 rail min assist. PT Short Term Goal 5 - Progress: Progressing toward goal  Skilled Therapeutic Interventions/Progress Updates:     General Chart Reviewed: Yes Family/Caregiver Present: No   Pain Pain Assessment Pain Assessment: No/denies pain Pain Score: 0-No pain  Other Treatments  W/C mobility with bilat LEs  > 150 feet x 2 supevision, focus on graded and symmetrical bilat LE movement left > right. Squat-pivot transfers min assist, focus on W/C set-up and parts management and slow graded movement to increased safety and functional independence. Sidestepping right and left in squat position for increased active right weight shift and increased graded left weight shift progressing to sidestepping left and right in standing, emphasis on emergent awareness of LOB and hip and ankle strategies for balance  recovery min to mod assist, manual facilitation at rib cage for anterior and lateral weight shifts. Forward and retrogait in squat position 10 feet x 3 each, focus on graded weight shifts and graded left LE movement and positioning in open chain positions mod assist, progressing to forward and retrogait in standing with no UE support, manual facilitation at rib cage for graded anterior lateral weight shifts, emphasis on emergent awareness of LOB, initiation of self-correction, and hip and ankle strategies. Obstacle negotiation around two cones in squat position with forward and retrogait and left and right sidestepping first in squat position then progressing to standing, manual facilitation for graded weight shifts in all planes, directional change, and hip and ankle strategies mod assist. Gait pushing W/C 100 feet min-mod assist, manual facilitation at rib cage for graded anterior and lateral weight shifts, focus on fluidity of movement and balance recovery with hip and ankle strategies.  Therapy/Group: Individual Therapy  [name] 10/12/2011, 12:15 PM

## 2011-10-12 NOTE — Progress Notes (Addendum)
Occupational Therapy Note  Patient Details  Name: Connor [name] MRN: [id number] Date of Birth: 14-Mar-1946 Today's Date: 10/12/2011 Time: 10:13-11:00am Individual Therapy Session  Pain: Pt c/o L sided headache rated 3/10; pre-medicated per his report.  Skilled Intervention/ADL's: Pt seen today for ADL retraining for bathe/dressing @ shower level. Pt uses w/c into shower (declines funct mobility w/ RW noted) and transfers w/ S using grab bar. Focus on midline orientation during ADL's and postural control. Sit - stand in shower for peri care w/ Supervision, otherwise utilizes tub bench. Overall supervision level for bathe/dress. Mod I grooming @ sink seated. Also focused on visual gaze stabilization/fixation exercises as issued previously in OT. Pt responds well to occasional cues during gaze fixation ex's. Motivated for therapy session.   Mariam Dollar Beth Dixon 10/12/2011, 11:01 AM

## 2011-10-12 NOTE — Progress Notes (Signed)
Physical Therapy Session Note  Patient Details  Name: Connor [name] MRN: [id number] Date of Birth: 10-08-1946  Today's Date: 10/12/2011 Time: 4098-1191 Time Calculation (min): 30 min  Precautions: Precautions Precautions: Fall Precaution Comments: Wallenberg's Syndrome with left leteropulsion Required Braces or Orthoses: No Restrictions Weight Bearing Restrictions: No Other Position/Activity Restrictions: Severe left bias in sitting and standing.  Worsens with activity  Short Term Goals: PT Short Term Goal 1: Patient will consistently transfer with min assist. PT Short Term Goal 1 - Progress: Met PT Short Term Goal 2: Patient will propel W/C > 150 feet with supervision, controlled environment. PT Short Term Goal 2 - Progress: Met PT Short Term Goal 3: Patient will maintain dynamic sitting balance with supervision > 5 minutes during functional task. PT Short Term Goal 3 - Progress: Met PT Short Term Goal 4: Patient will gait 75 feet with min assist, LRAD. PT Short Term Goal 4 - Progress: Partly met PT Short Term Goal 5: Patient will negotiate 12 steps with 1 rail min assist. PT Short Term Goal 5 - Progress: Partly met  Pain Pain Assessment Pain Assessment: No/denies pain  Other Treatments Treatments Therapeutic Activity: Multiple sit to stands from w/c with min A with verbal cues for hand placement but with improved anterior lean in midline; short distance ambulation over carpet without AD but with min A for weight shifting and pelvic rotation; static and dynamic standing reaching for objects to L for L weight shift and cognitive activity for alternating attention.    Therapy/Group: Individual Therapy  Edman Circle [facility] 10/12/2011, 4:09 PM

## 2011-10-13 MED ORDER — GABAPENTIN 100 MG PO CAPS
100.0000 mg | ORAL_CAPSULE | Freq: Two times a day (BID) | ORAL | Status: DC
  Administered 2011-10-13 – 2011-10-14 (×3): 100 mg via ORAL
  Filled 2011-10-13 (×5): qty 1

## 2011-10-13 MED ORDER — TRAMADOL HCL 50 MG PO TABS: 50.0000 mg | ORAL_TABLET | Freq: Four times a day (QID) | ORAL | Status: AC | PRN

## 2011-10-13 MED ORDER — SIMVASTATIN 20 MG PO TABS: 20.0000 mg | ORAL_TABLET | Freq: Every day | ORAL | Status: DC

## 2011-10-13 MED ORDER — GABAPENTIN 100 MG PO CAPS: 100.0000 mg | ORAL_CAPSULE | Freq: Two times a day (BID) | ORAL | Status: DC

## 2011-10-13 MED ORDER — AMLODIPINE BESYLATE 5 MG PO TABS: 5.0000 mg | ORAL_TABLET | Freq: Every day | ORAL | Status: DC

## 2011-10-13 NOTE — Progress Notes (Addendum)
Min to [employer] walker. Pt moves about in wc predominately.  Pain controlled by current medication regemin. No skin problems. Good appetite. Continent of bowels and bladder. No poor safety practices noted. Participating with therapies. Continue plan of care.

## 2011-10-13 NOTE — Progress Notes (Signed)
Physical Therapy Session Note  Patient Details  Name: Connor [name] MRN: [id number] Date of Birth: July 22, 1946  Today's Date: 10/13/2011 Time: 0800-0830 Time Calculation (min): 30 min  Precautions: Precautions Precautions: Fall Precaution Comments: Wallenberg's Syndrome with left lateropulsion, LE ataxia Required Braces or Orthoses: No Restrictions Weight Bearing Restrictions: No Other Position/Activity Restrictions: Severe left bias in sitting and standing.  Worsens with activity  Short Term Goals: PT Short Term Goal 1: Patient will transfer with modified independence. PT Short Term Goal 1 - Progress: Progressing toward goal PT Short Term Goal 2: Patient will propel W/C > 150 feet with modified independence, controlled environment. PT Short Term Goal 2 - Progress: Progressing toward goal PT Short Term Goal 3: Patient will propel W/C > 50 feet with modified independence in household environment. PT Short Term Goal 3 - Progress: Progressing toward goal PT Short Term Goal 4: Patient will gait > 100 feet with supervision in controlled environemt, LRAD. PT Short Term Goal 4 - Progress: Progressing toward goal PT Short Term Goal 5: Patient will negotiate 12 steps with 1 rail with supervision. PT Short Term Goal 5 - Progress: Progressing toward goal  Skilled Therapeutic Interventions/Progress Updates:     General Chart Reviewed: Yes Family/Caregiver Present: No   Pain Pain Assessment Pain Assessment: 0-10 Pain Score:   6 Pain Type: Other (Comment) (headache) Pain Location: Head Pain Orientation: Left Pain Descriptors: Aching Pain Onset: On-going Patients Stated Pain Goal: 0 Pain Intervention(s): Emotional support;RN made aware;Other (Comment) (patient pre-medicated)  Other Treatments  Patient declined first 30 min of PT session this morning secondary to eating breakfast. Will follow up this afternoon as able. Sit to squat with sidestepping left and right, focus on active  right weight shift and graded left weight shift and directional changes mod assist, verbal cues for left LE position and timing of glute activation to increase stability. Gait with no device 35 feet x 2 min-mod assist, manual facilitation at rib cage to increase right anterior weight shift in right stance phase and grade left lateral weight shift in left stance phase, verbal cues to increase left BOS for further increased stability. Patient tends to place left LE across midline today causing increased LOB to the left with active movement. Standing alternating toe taps on 4 inch step, focus on bilat stance components and graded weight shifts followed by stair negotiation x 10 steps with left rail min assist for home access, focus on left LE position on step to increase BOS and stability to decrease risk of fall.  Therapy/Group: Individual Therapy  [name] 10/13/2011, 10:23 AM

## 2011-10-13 NOTE — Progress Notes (Signed)
Patient ID: Connor [name], male   DOB: Dec 25, 1945, 65 y.o.   MRN: [id number]  Subjective/Complaints: .  Blurring of vision overall improved off antivert.  Headache L side face pain  Review of Systems  Neurological: Positive for tingling, sensory change and speech change.  All other systems reviewed and are negative. +diplopia Constitutional: He is oriented to person, place, and time. He appears well-developed.  HENT:  Head: Normocephalic.  Neck: Normal range of motion. Neck supple. No thyromegaly present.  Cardiovascular: Normal rate and regular rhythm.  Pulmonary/Chest: Effort normal. No respiratory distress.  Abdominal: He exhibits no distension. There is no guarding.  Musculoskeletal: He exhibits no edema. L upper trap tender Neurological: He is alert and oriented to person, place, and time. He has normal reflexes. A cranial nerve deficit and sensory deficit is present.  Patient with left ptosis and mild left-sided facial sensory loss. LUE limb ataxia resolved , He did have double vision clears with Left lateral gaze and eye closure although no gross disconjugate gaze is seen. No nystagmus was appreciated. Patient did not experience vertigo. He had mild right arm and leg sensory loss to pain and temperature. Strength is grossly 4-4+ out of 5 bilaterally. Cognitively he is intact.  Skin: Skin is warm and dry.  Psychiatric: He has a normal mood and affect   Objective: Vital Signs: Blood pressure 149/103, pulse 85, temperature 97.8 F (36.6 C), temperature source Oral, resp. rate 20, height 5\' 11"  (1.803 m), weight 87.5 kg (192 lb 14.4 oz), SpO2 100.00%. No results found. Results for orders placed during the hospital encounter of 09/30/11 (from the past 72 hour(s))  PHENYTOIN LEVEL, TOTAL     Status: Abnormal   Collection Time   10/04/11  5:47 AM      Component Value Range Comment   Phenytoin Lvl 20.1 (*) 10.0 - 20.0 (ug/mL)        Assessment/Plan: 1. Functional deficits  secondary to L Wallenburg syndrome which require 3+ hours per day of interdisciplinary therapy in a comprehensive inpatient rehab setting. Physiatrist is providing close team supervision and 24 hour management of active medical problems listed below. Physiatrist and rehab team continue to assess barriers to discharge/monitor patient progress toward functional and medical goals.  Cognition: Cognition Orientation Level: Oriented X4 Cognition Orientation Level: Oriented X4  2. Anticoagulation/DVT prophylaxis with Pharmaceutical: Lovenox 3. Pain Management: needs gabapentin for dysesthesia Medical Problem List and Plan:  1. 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 Continue Antivert as needed for dizziness. May need vestibular evaluation  2.. DVT Prophylaxis/Anticoagulation: ASA/SCD/Support hose. Monitor for increased signs of deep vein thrombosis  3. Dysphasia-presently on dysphagia to thin liquid diet. Speech therapy a followup advance diet as tolerated. Monitor for any signs of aspiration  4. Seizure disorder-continue Lamictal 100 mg in the a.m. and 200 mg at bedtime. Monitor closely after recent seizure.  5. Mood-provide emotional support and positive reinforcement.  6. History of alcohol abuse-provide counseling 7.  Diplopia/visual def increased but other CVA deficits decreased will dc meclizine 8.  HTN was on exforge at home.  On amlodipine 5mg  today and monitor increase to 10mg  if no better FACE to [facility] meeting 1  KIRSTEINS,ANDREW E 10/06/2011, 6:12 AM

## 2011-10-13 NOTE — Progress Notes (Signed)
Social Work Patient ID: Connor [name], male   DOB: October 29, 1945, 65 y.o.   MRN: [id number]  Met with pt this am to discuss his conversation with his son last night. He wants to play it day by day.  He certainly does not Want to stay until the 31 st but would consider 12/29-Friday. He can [name] his progress and feels he wants to be home at Christmas. He will have A family member with him for the next 4 weeks.  He will talk with Dr Wynn Banker regarding his thoughts. Pt is aware his blood pressure meds were being Adjusted.  Discussed follow up therapies and he is agreeable, referral made to [facility] for follow up Pt, OT and RN.  He reports his vision is  Getting better daily.  Continue to work toward discharge. He has all DME from knee replacment last year. Decision up to MD and pt. Dan-PA aware Of possible discharge Monday or Friday.

## 2011-10-13 NOTE — Progress Notes (Signed)
Occupational Therapy Session Note  Patient Details  Name: Connor [name] MRN: [id number] Date of Birth: May 05, 1946  Today's Date: 10/13/2011 Time: 1405-1500 Time Calculation (min): 55 min  Precautions: Precautions Precautions: Fall Precaution Comments: Wallenberg's Syndrome with left lateropulsion, LE ataxia Required Braces or Orthoses: No Restrictions Weight Bearing Restrictions: No Other Position/Activity Restrictions: Severe left bias in sitting and standing.  Worsens with activity  Short Term Goals: OT Short Term Goal 1: Patient will transfer into shower with min assist OT Short Term Goal 2: Patient will dress lower body with set up assist OT Short Term Goal 3: Patient will transfer to toilet with min assist OT Short Term Goal 4: Patient will shower with supervision  Skilled Therapeutic Interventions/Progress Updates:    Engaged in therapeutic activity in standing with focus on improving BOS, pt prefers to have wide BOS.  Encouraged scanning and ocular ROM with activity in standing to obtain items, while adhering to narrower BOS, and encouraging weight shift towards Rt side.  Dynamic standing with reaching in all planes, bending down on one knee to obtain items on floor while maintaining balance with pt demonstrating LOB x2, but was able to reach out and correct.  Functional ambulation with and without RW with min to mod assist for steadying.  Nustep for 10 mins to increase activity tolerance.  Pain Pain Assessment Pain Assessment: 0-10 Pain Score:   0  Therapy/Group: Individual Therapy  Leonette Monarch 10/13/2011, 3:17 PM

## 2011-10-13 NOTE — Progress Notes (Signed)
Occupational Therapy Session Note  Patient Details  Name: Connor [name] MRN: [id number] Date of Birth: [birth year]  Today's Date: 10/13/2011 Time:  0915- 1000    Precautions: Precautions Precautions: Fall Precaution Comments: Wallenberg's Syndrome with left lateropulsion, LE ataxia Required Braces or Orthoses: No Restrictions Weight Bearing Restrictions: No Other Position/Activity Restrictions: Severe left bias in sitting and standing.  Worsens with activity   Skilled Therapeutic Interventions/Progress Updates:    ADL retraining including bathing in walk-in shower and dressing from w/c at sink.  Pt elected to use w/c to enter BR for shower.  Bathing and dressing tasks completed at supervision level.  Focus on safety awareness and dynamic standing for bathing and dressing.  Pt engaged in dynamic standing activities and functional ambulation in gym for home mgmt tasks involving reaching and use of reacher to gather items from floor. Pt completed all tasks at supervision level.    Pain Pain Assessment Pain Score:   6 (Headache) RN aware ADL     Therapy/Group: Individual Therapy  Rich Brave 10/13/2011, 2:29 PM

## 2011-10-13 NOTE — Progress Notes (Signed)
Physical Therapy Note  Patient Details  Name: CLIMMIE BUELOW MRN: [id number] Date of Birth: 12/12/1945 Today's Date: 10/13/2011  4098-1191 (30 minutes) individual treatment Pain: 2/10 headache (posterior)- premedicated Focus of treatment: Gait training with/without AD; Therapeutic activities to facilitate midline stance Pt. Ambulates 60 feet X 1 RW min assist for balance using wide base of support and halting gait pattern Standing with alternate LE on 4 inch step X 2 minute trials : without touching AD, pt has self reported lean to left and requires at least unilateral UE support to correct with difficulty.  Gait without AD: 60 feet with halting steps and wide base of support. Pt. Required intermittent standing  stops to prevent loss of balance Johnnie Moten,JIM 10/13/2011, 4:41 PM

## 2011-10-13 NOTE — Progress Notes (Signed)
Physical Therapy Note  Patient Details  Name: Connor [name] MRN: [id number] Date of Birth: November 18, 1945 Today's Date: 10/12/11 1400-1430 30 min No report of pain Individual treatment Skilled clinical intervention:  Patient participated in therapeutic activity to address dynamic sitting balance.  Reviewed discharge plan.  Patient concerned regarding extended date, however, explained to him that the goal is to increase independence / safety in home as external support from family and friends is minimal.     Collier Salina 10/13/2011, 3:48 PM

## 2011-10-13 NOTE — Progress Notes (Signed)
Therapeutic Recreation Discharge Summary Patient Details  Name: Connor [name] MRN: [id number] Date of Birth: 10/08/1946  Pt discharged from TR services due to limited participation in TR tasks.  Goal is not applicable. Pt d/c set for 10/24/11.    Ahmia Colford 10/13/2011, 5:07 PM

## 2011-10-14 MED ORDER — GABAPENTIN 100 MG PO CAPS
100.0000 mg | ORAL_CAPSULE | Freq: Three times a day (TID) | ORAL | Status: DC
  Administered 2011-10-14 – 2011-10-17 (×9): 100 mg via ORAL
  Filled 2011-10-14 (×12): qty 1

## 2011-10-14 NOTE — Progress Notes (Signed)
Occupational Therapy Session Note  Patient Details  Name: Connor [name] MRN: [id number] Date of Birth: September 25, 1946  Today's Date: 10/14/2011  Precautions: Precautions Precautions: Fall Precaution Comments: Wallenberg's Syndrome with left lateropulsion, LE ataxia Required Braces or Orthoses: No Restrictions Weight Bearing Restrictions: No Other Position/Activity Restrictions: Severe left bias in sitting and standing.  Worsens with activity  Short Term Goals: OT Short Term Goal 1: Patient will transfer into shower with min assist OT Short Term Goal 2: Patient will dress lower body with set up assist OT Short Term Goal 3: Patient will transfer to toilet with min assist OT Short Term Goal 4: Patient will shower with supervision  Treatment Session 1:  Time: 4098-1191 Time Calculation (min): 56 min   Pt seen for ADL retraining at walk-in shower level.  Pt chose to transfer from w/c to tubbench with stand pivot technique with use of grabbars with distant supervision.  Bathing completed in a sit to stand manner with 50% performed in standing.  Grooming conducted in standing at sink with close supervision and min cues to maintain upright standing balance and not lean on counter.  Neuro re-ed with visual ocular motor skills.  Pain: pt reports slight headache, rated a 2, nsg premedicated  Treatment Session 2: Time: 1400-1502 Time Calculation (min): 62 Engaged in therapeutic activity with Wii to focus on static and dynamic standing balance and righting reactions.  Use of wedge under LLE to increase weight bearing through RLE and tactile cues for weight shifting as pt has left preference in standing.  Use of eye patch intermittently with pt reporting not a lot of benefit with blurriness and double vision.  Pt min assist/steady assist with functional ambulation.  No c/o pain this session.  Therapy/Group: Individual Therapy  Leonette Monarch 10/14/2011, 12:28 PM

## 2011-10-14 NOTE — Progress Notes (Signed)
Patient ID: Connor [name], male   DOB: [birth year], 65 y.o.   MRN: [id number]  Subjective/Complaints: .  Blurring of vision overall improved off antivert.  Headache L side face pain improving on gabapentin  Review of Systems  Neurological: Positive for tingling, sensory change and speech change.  All other systems reviewed and are negative. +diplopia Subjective/Complaints:   Objective: Vital Signs: Blood pressure 131/77, pulse 85, temperature 98.7 F (37.1 C), temperature source Oral, resp. rate 20, height 5\' 11"  (1.803 m), weight 91 kg (200 lb 9.9 oz), SpO2 96.00%. No results found. No results found for this or any previous visit (from the past 72 hour(s)).    Constitutional: He is oriented to person, place, and time. He appears well-developed.  HENT:  Head: Normocephalic.  Neck: Normal range of motion. Neck supple. No thyromegaly present.  Cardiovascular: Normal rate and regular rhythm.  Pulmonary/Chest: Effort normal. No respiratory distress.  Abdominal: He exhibits no distension. There is no guarding.  Musculoskeletal: He exhibits no edema. L upper trap tender Neurological: He is alert and oriented to person, place, and time. He has normal reflexes. A cranial nerve deficit and sensory deficit is present.  Patient with left ptosis and mild left-sided facial sensory loss. LUE limb ataxia resolved , He did have double vision clears with Left lateral gaze and eye closure although no gross disconjugate gaze is seen. No nystagmus was appreciated. Patient did not experience vertigo. He had mild right arm and leg sensory loss to pain and temperature. Strength is grossly 4-4+ out of 5 bilaterally. Cognitively he is intact.  Skin: Skin is warm and dry.  Psychiatric: He has a normal mood and affect  Assessment/Plan: 1. 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 Physiatrist is providing close team supervision and 24 hour management of active medical problems listed below. Physiatrist and rehab team continue to assess barriers to discharge/monitor patient progress toward functional and medical goals. Mobility: Bed Mobility Bed Mobility: No Transfers Sit to Stand: 4: Min assist Stand to Sit: 3: Mod assist Stand Pivot Transfers: 3: Mod assist Ambulation/Gait Ambulation/Gait Assistance: 3: Mod assist Ambulation/Gait Assistance Details (indicate cue type and reason): left lateropulsion, left LE narrows BOS, ataxic LE movement Ambulation Distance (Feet): 5 Feet Assistive device: 1 person hand held assist Stairs: Yes Stairs Assistance: 4: Min assist Stair Management Technique: Two rails;Step to pattern Number of Stairs: 10  Height of Stairs: 6  Wheelchair Mobility Wheelchair Mobility: Yes Wheelchair Assistance: 4: Systems analyst: Both lower extermities Wheelchair Parts Management: Needs assistance Distance: 150  ADL:    Cognition: Cognition Overall Cognitive Status: Appears within functional limits for tasks assessed Arousal/Alertness: Awake/alert Orientation Level: Oriented X4 Attention: Alternating Selective Attention: Appears intact Alternating Attention: Appears intact Memory: Appears intact Awareness: Appears intact Problem Solving: Appears intact Problem Solving Impairment: Verbal complex Behaviors: Impulsive Safety/Judgment: Appears intact Comments: WFL patient appers at baseline Cognition Arousal/Alertness: Awake/alert Orientation Level: Oriented X4  2. Anticoagulation/DVT prophylaxis with Pharmaceutical: Lovenox 3. Pain Management: Gabapentin   9  Connor [name] E 10/14/2011, 10:17 AM    Objective: Vital Signs: Blood pressure 149/103, pulse 85, temperature 97.8 F (36.6 C), temperature source Oral, resp. rate 20, height 5\' 11"  (1.803 m), weight 87.5 kg (192 lb 14.4 oz), SpO2  100.00%. No results found. Results for orders placed during the hospital encounter of 09/30/11 (from the past 72 hour(s))  PHENYTOIN LEVEL, TOTAL  Status: Abnormal   Collection Time   10/04/11  5:47 AM      Component Value Range Comment   Phenytoin Lvl 20.1 (*) 10.0 - 20.0 (ug/mL)     Assessment/Plan: 1. 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 Physiatrist is providing close team supervision and 24 hour management of active medical problems listed below. Physiatrist and rehab team continue to assess barriers to discharge/monitor patient progress toward functional and medical goals.  Cognition: Cognition Orientation Level: Oriented X4 Cognition Orientation Level: Oriented X4  2. Anticoagulation/DVT prophylaxis with Pharmaceutical: Lovenox 3. Pain Management: needs gabapentin for dysesthesia Medical Problem List and Plan:  1. 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 Continue Antivert as needed for dizziness. May need vestibular evaluation  2.. DVT Prophylaxis/Anticoagulation: ASA/SCD/Support hose. Monitor for increased signs of deep vein thrombosis  3. Dysphasia-presently on dysphagia to thin liquid diet. Speech therapy a followup advance diet as tolerated. Monitor for any signs of aspiration  4. Seizure disorder-continue Lamictal 100 mg in the a.m. and 200 mg at bedtime. Monitor closely after recent seizure.  5. Mood-provide emotional support and positive reinforcement.  6. History of alcohol abuse-provide counseling 7.  Diplopia/visual def increased but other CVA deficits decreased will dc meclizine 8.  HTN was on exforge at home.  On amlodipine 5mg  today and monitor increase to 10mg  if no better FACE to [facility] meeting 1  Connor [name] E 10/06/2011, 6:12 AM

## 2011-10-14 NOTE — Progress Notes (Signed)
Min to [employer] walker. Pt moves about in wc predominately.Head ache pain controlled by current medication regemin. No skin problems. Good appetite. Continent of bowels and bladder. No poor safety practices noted. Participating with therapies. Continue plan of care.

## 2011-10-14 NOTE — Progress Notes (Signed)
Physical Therapy Session Note  Patient Details  Name: Connor [name] MRN: [id number] Date of Birth: February 01, 1946  Today's Date: 10/14/2011 Time: 7846-9629 Time Calculation (min): 59 min  Precautions: Precautions Precautions: Fall Precaution Comments: Wallenberg's Syndrome with left lateropulsion, LE ataxia Required Braces or Orthoses: No Restrictions Weight Bearing Restrictions: No Other Position/Activity Restrictions: Severe left bias in sitting and standing.  Worsens with activity  Short Term Goals: PT Short Term Goal 1: Patient will transfer with modified independence. PT Short Term Goal 1 - Progress: Progressing toward goal PT Short Term Goal 2: Patient will propel W/C > 150 feet with modified independence, controlled environment. PT Short Term Goal 2 - Progress: Progressing toward goal PT Short Term Goal 3: Patient will propel W/C > 50 feet with modified independence in household environment. PT Short Term Goal 3 - Progress: Progressing toward goal PT Short Term Goal 4: Patient will gait > 100 feet with supervision in controlled environemt, LRAD. PT Short Term Goal 4 - Progress: Progressing toward goal PT Short Term Goal 5: Patient will negotiate 12 steps with 1 rail with supervision. PT Short Term Goal 5 - Progress: Progressing toward goal  Pain Pain Assessment Pain Assessment: 0-10 Pain Score:   2 Pain Type: Acute pain Pain Location: Head Pain Descriptors: Headache Pain Onset: On-going Pain Intervention(s): RN made aware  Other Treatments Treatments Therapeutic Activity: Patient performed toilet transfers with RW and toileting with supervision and minimal cues needed for safety; had long discussion with patient about his D/C date and benefit of one more week to reach mod I with balance, gait and transfers. Patient feels that he will have adequate supervision at home and would be ready to leave Monday; he also feels that he would be able to perform transfers in his room  and ambulation in his room at a mod I level if given the chance.    Neuromuscular Facilitation: Activity to increase lateral weight shifting;Activity to increase anterior-posterior weight shifting during standing on foam balance beam for ankle and hip strategy training during R and L lateral weight shifts with parallel bars as boundaries, anterior/posterior weight shifts, static standing during ball toss; attempted squats on beam but patient with significant L lateral lean; changed to standing with R shoulder and hip against wall and performed 10 squats on solid ground and 10 squats on balance beam with min-mod A.  When patient over on RLE patient feels he is falling and when on LLE he feels he is in midline.    Therapy/Group: Individual Therapy  [facility] 10/14/2011, 4:58 PM

## 2011-10-15 NOTE — Progress Notes (Signed)
Complaint of headache, medicated with effective results.participating in therapies.

## 2011-10-15 NOTE — Progress Notes (Signed)
Occupational Therapy Note  Patient Details  Name: Connor [name] MRN: [id number] Date of Birth: [birth year] Today's Date: 10/15/2011  Time in/out: [phone] Total minutes: 45  Pain: 0/10; no report of pain  Skilled intervention: ADL-retraining shower level, walk-in shower with tub bench/grab bars, with emphasis on safety awareness, discharge planning, dynamic standing balance, skilled use of DME and AE.   Patient completed shower, toiletting, and grooming (at sink side) but deferred shaving.   Patient was efficient and effective with performance of ADL and transfers, requiring no more than supervision for safety.  Patient expressed plan for possible home modifications (railing at 2-steps in hallway between 1st and 2nd level of split-level home).   Patient reports interest in skilled use of walker while using stairs (OT provided written and verbal directions on several methods using standard, not wheeled, walker).  Patient retains standard walker from previous hospital stay for left knee replacement and verbalized plan to contact his network of retired friends for support with community mobility.   Patient relates that he will not drive his car or attempt driving until cleared by MD.  Individual session    Georgeanne Nim 10/15/2011, 4:19 PM

## 2011-10-15 NOTE — Progress Notes (Signed)
Patient ID: Connor [name], male   DOB: [birth year], 65 y.o.   MRN: [id number] Patient ID: Connor [name], male   DOB: [birth year], 65 y.o.   MRN: [id number]  Subjective/Complaints: He is wondering if he should go home on Mon or not. Blurring of vision overall improved off antivert.  Headache L side face pain improving on gabapentin  Review of Systems  Neurological: Positive for tingling, sensory change and speech change.  All other systems reviewed and are negative. +diplopia Subjective/Complaints:   Objective: Vital Signs: Blood pressure 129/87, pulse 88, temperature 98.8 F (37.1 C), temperature source Oral, resp. rate 19, height 5\' 11"  (1.803 m), weight 200 lb 9.9 oz (91 kg), SpO2 97.00%. No results found. No results found for this or any previous visit (from the past 72 hour(s)).    Constitutional: He is oriented to person, place, and time. He appears well-developed.  HENT:  Head: Normocephalic.  Neck: Normal range of motion. Neck supple. No thyromegaly present.  Cardiovascular: Normal rate and regular rhythm.  Pulmonary/Chest: Effort normal. No respiratory distress.  Abdominal: He exhibits no distension. There is no guarding.  Musculoskeletal: He exhibits no edema. L upper trap tender Neurological: He is alert and oriented to person, place, and time. He has normal reflexes. A cranial nerve deficit and sensory deficit is present.  Patient with left ptosis and mild left-sided facial sensory loss. LUE limb ataxia resolved , He did have double vision clears with Left lateral gaze and eye closure although no gross disconjugate gaze is seen. No nystagmus was appreciated. Patient did not experience vertigo. He had mild right arm and leg sensory loss to pain and temperature. Strength is grossly 4-4+ out of 5 bilaterally. Cognitively he is intact.  Skin: Skin is warm and dry.  Psychiatric: He has a normal mood and affect  Assessment/Plan: 1. Functional deficits secondary to Ataxia d/t  L Wallenburg syndrome which require 3+ hours per day of interdisciplinary therapy in a comprehensive inpatient rehab setting. Physiatrist is providing close team supervision and 24 hour management of active medical problems listed below. Physiatrist and rehab team continue to assess barriers to discharge/monitor patient progress toward functional and medical goals. Mobility: Bed Mobility Bed Mobility: No Transfers Sit to Stand: 4: Min assist Stand to Sit: 3: Mod assist Stand Pivot Transfers: 3: Mod assist Ambulation/Gait Ambulation/Gait Assistance: 3: Mod assist Ambulation/Gait Assistance Details (indicate cue type and reason): left lateropulsion, left LE narrows BOS, ataxic LE movement Ambulation Distance (Feet): 5 Feet Assistive device: 1 person hand held assist Stairs: Yes Stairs Assistance: 4: Min assist Stair Management Technique: Two rails;Step to pattern Number of Stairs: 10  Height of Stairs: 6  Wheelchair Mobility Wheelchair Mobility: Yes Wheelchair Assistance: 4: Systems analyst: Both lower extermities Wheelchair Parts Management: Needs assistance Distance: 150  ADL:    Cognition: Cognition Overall Cognitive Status: Appears within functional limits for tasks assessed Arousal/Alertness: Awake/alert Orientation Level: Oriented X4 Attention: Alternating Selective Attention: Appears intact Alternating Attention: Appears intact Memory: Appears intact Awareness: Appears intact Problem Solving: Appears intact Problem Solving Impairment: Verbal complex Behaviors: Impulsive Safety/Judgment: Appears intact Comments: WFL patient appers at baseline Cognition Arousal/Alertness: Awake/alert Orientation Level: Oriented X4  2. Anticoagulation/DVT prophylaxis with Pharmaceutical: Lovenox 3. Pain Management: Gabapentin   10  Connor [name] 10/15/2011, 9:39 AM    Objective: Vital Signs: Blood pressure 149/103, pulse 85, temperature 97.8 F (36.6  C), temperature source Oral, resp. rate 20, height 5\' 11"  (1.803 m), weight 87.5 kg (192 lb 14.4  oz), SpO2 100.00%. No results found. Results for orders placed during the hospital encounter of 09/30/11 (from the past 72 hour(s))  PHENYTOIN LEVEL, TOTAL     Status: Abnormal   Collection Time   10/04/11  5:47 AM      Component Value Range Comment   Phenytoin Lvl 20.1 (*) 10.0 - 20.0 (ug/mL)     Assessment/Plan: 1. 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 Physiatrist is providing close team supervision and 24 hour management of active medical problems listed below. Physiatrist and rehab team continue to assess barriers to discharge/monitor patient progress toward functional and medical goals.  Cognition: Cognition Orientation Level: Oriented X4 Cognition Orientation Level: Oriented X4  2. Anticoagulation/DVT prophylaxis with Pharmaceutical: Lovenox 3. Pain Management: needs gabapentin for dysesthesia Medical Problem List and Plan:  1. 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 Continue Antivert as needed for dizziness. May need vestibular evaluation  2.. DVT Prophylaxis/Anticoagulation: ASA/SCD/Support hose. Monitor for increased signs of deep vein thrombosis  3. Dysphasia-presently on dysphagia to thin liquid diet. Speech therapy a followup advance diet as tolerated. Monitor for any signs of aspiration  4. Seizure disorder-continue Lamictal 100 mg in the a.m. and 200 mg at bedtime. Monitor closely after recent seizure.  5. Mood-provide emotional support and positive reinforcement.  6. History of alcohol abuse-provide counseling 7.  Diplopia/visual def increased but other CVA deficits decreased will dc meclizine 8.  HTN was on exforge at home.  On amlodipine 5mg  today and monitor increase to 10mg  if no better FACE to  [facility] meeting 1  KIRSTEINS,ANDREW E 10/06/2011, 6:12 AM

## 2011-10-15 NOTE — Progress Notes (Signed)
Physical Therapy Note  Patient Details  Name: CELESTER LECH MRN: [id number] Date of Birth: Oct 13, 1946 Today's Date: 10/15/2011  1400-1455 (55 minutes) group treatment Focus of treatment: Gait training with RW focusing on decreasing ataxia; Standing balance activities to facilitate midline stance (decrease lean to left) Pt ambulates 160 feet X 2 with RW min to close supervision using wide base of support and decreased cadence to control foot placement; Static standing in shoulder width stance or tandem stance against resistance using theraband.   Ellias Mcelreath,JIM 10/15/2011, 3:40 PM

## 2011-10-15 NOTE — Progress Notes (Signed)
Occupational Therapy Note  Patient Details  Name: Connor [name] MRN: [id number] Date of Birth: [birth year] Today's Date: 10/15/2011 Time:  1300-1400  (60 mins. Group Therapy Pain:  Headache:  5/10  Skilled OT intervention to address functional mobility, balance in standing (dynamic), gaze stabilization on floor with patterned floor.  Instructed pt on walker safety and balance strategies when engaging in functional tasks, ie making bed, carrying beverage from refrigerator to dining table.  Pt. Needed minimal verbal cues to be consistent with safety and core body control.     Humberto Seals 10/15/2011, 5:04 PM

## 2011-10-15 NOTE — Progress Notes (Signed)
Physical Therapy Session Note  Patient Details  Name: Connor [name] MRN: [id number] Date of Birth: 11/17/1945  Today's Date: 10/15/2011 Time: 0825 - 0851   31 minutes  Precautions: Precautions Precautions: Fall Precaution Comments: Wallenberg's Syndrome with left lateropulsion, LE ataxia left > right Required Braces or Orthoses: No Restrictions Weight Bearing Restrictions: No Other Position/Activity Restrictions: Severe left bias in sitting and standing.  Worsens with activity  Short Term Goals: PT Short Term Goal 1: Patient will transfer with modified independence. PT Short Term Goal 1 - Progress: Progressing toward goal PT Short Term Goal 2: Patient will propel W/C > 150 feet with modified independence, controlled environment. PT Short Term Goal 2 - Progress: Progressing toward goal PT Short Term Goal 3: Patient will propel W/C > 50 feet with modified independence in household environment. PT Short Term Goal 3 - Progress: Progressing toward goal PT Short Term Goal 4: Patient will gait > 100 feet with supervision in controlled environemt, LRAD. PT Short Term Goal 4 - Progress: Progressing toward goal PT Short Term Goal 5: Patient will negotiate 12 steps with 1 rail with supervision. PT Short Term Goal 5 - Progress: Progressing toward goal  Skilled Therapeutic Interventions/Progress Updates:     General Chart Reviewed: Yes Family/Caregiver Present: No   Pain Pain Assessment Pain Assessment: No/denies pain Pain Score: 0-No pain  Other Treatments  W/C mobility to and from gym with bilat LEs focus on LE coordination, symmetry, and graded open chain movements. Squat-pivot transfers W/C to mat table close supervision, emphasis on patient self-managing W/C set-up and parts management and sequencing of task with min verbal cues of therapist. Sit to squat position with one side step to the left, one side step to the right x 5 reps each min assist, manual facilitation for graded  bilat weight shifts and graded movement of left LE in open chain followed by sidestepping left and right in standing position with min assist, verbal cues to increase left hip extension in stance to increase stability and grading of weight shift. Gait with RW 50 feet x 2 min assist, manual facilitation at rib cage to decrease left weight shift and increase left hip extension in stance phase for improved graded weight shift and decrease risk of fall.  Therapy/Group: Individual Therapy  [name] 10/15/2011, 10:45 AM

## 2011-10-16 NOTE — Progress Notes (Signed)
Patient ID: Connor [name], male   DOB: [birth year], 65 y.o.   MRN: [id number] Patient ID: Connor [name], male   DOB: Feb 13, 1946, 65 y.o.   MRN: [id number] Patient ID: Connor [name], male   DOB: 09-02-1946, 65 y.o.   MRN: [id number]  Subjective/Complaints: He is wondering if he should go home on Mon or not. Blurring of vision overall improved off antivert.  Headache L side face pain improving on gabapentin  Review of Systems  Neurological: Positive for tingling, sensory change and speech change.  All other systems reviewed and are negative. +diplopia Subjective/Complaints:   Objective: Vital Signs: Blood pressure 119/79, pulse 77, temperature 98.5 F (36.9 C), temperature source Oral, resp. rate 20, height 5\' 11"  (1.803 m), weight 200 lb 9.9 oz (91 kg), SpO2 97.00%. No results found. No results found for this or any previous visit (from the past 72 hour(s)).    Constitutional: He is oriented to person, place, and time. He appears well-developed.  HENT:  Head: Normocephalic.  Neck: Normal range of motion. Neck supple. No thyromegaly present.  Cardiovascular: Normal rate and regular rhythm.  Pulmonary/Chest: Effort normal. No respiratory distress.  Abdominal: He exhibits no distension. There is no guarding.  Musculoskeletal: He exhibits no edema. L upper trap tender Neurological: He is alert and oriented to person, place, and time. He has normal reflexes. A cranial nerve deficit and sensory deficit is present.  Patient with left ptosis and mild left-sided facial sensory loss. LUE limb ataxia resolved , He did have double vision clears with Left lateral gaze and eye closure although no gross disconjugate gaze is seen. No nystagmus was appreciated. Patient did not experience vertigo. He had mild right arm and leg sensory loss to pain and temperature. Strength is grossly 4-4+ out of 5 bilaterally. Cognitively he is intact.  Skin: Skin is warm and dry.  Psychiatric: He has a normal mood  and affect  Assessment/Plan: 1. 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 Physiatrist is providing close team supervision and 24 hour management of active medical problems listed below. Physiatrist and rehab team continue to assess barriers to discharge/monitor patient progress toward functional and medical goals. Mobility: Bed Mobility Bed Mobility: No Transfers Sit to Stand: 4: Min assist Stand to Sit: 3: Mod assist Stand Pivot Transfers: 3: Mod assist Ambulation/Gait Ambulation/Gait Assistance: 3: Mod assist Ambulation/Gait Assistance Details (indicate cue type and reason): left lateropulsion, left LE narrows BOS, ataxic LE movement Ambulation Distance (Feet): 5 Feet Assistive device: 1 person hand held assist Stairs: Yes Stairs Assistance: 4: Min assist Stair Management Technique: Two rails;Step to pattern Number of Stairs: 10  Height of Stairs: 6  Wheelchair Mobility Wheelchair Mobility: Yes Wheelchair Assistance: 4: Systems analyst: Both lower extermities Wheelchair Parts Management: Needs assistance Distance: 150  ADL:    Cognition: Cognition Overall Cognitive Status: Appears within functional limits for tasks assessed Arousal/Alertness: Awake/alert Orientation Level: Oriented X4 Attention: Alternating Selective Attention: Appears intact Alternating Attention: Appears intact Memory: Appears intact Awareness: Appears intact Problem Solving: Appears intact Problem Solving Impairment: Verbal complex Behaviors: Impulsive Safety/Judgment: Appears intact Comments: WFL patient appers at baseline Cognition Arousal/Alertness: Awake/alert Orientation Level: Oriented X4  2. Anticoagulation/DVT prophylaxis with Pharmaceutical: Lovenox 3. Pain Management: Gabapentin   11  Connor [name] 10/16/2011, 9:49 AM     Objective: Vital Signs: Blood pressure 149/103, pulse 85, temperature 97.8 F (36.6 C), temperature  source Oral, resp. rate 20, height 5\' 11"  (1.803 m), weight 87.5 kg (192 lb 14.4 oz), SpO2 100.00%. No results found. Results for orders placed during the hospital encounter of 09/30/11 (from the past 72 hour(s))  PHENYTOIN LEVEL, TOTAL     Status: Abnormal   Collection Time   10/04/11  5:47 AM      Component Value Range Comment   Phenytoin Lvl 20.1 (*) 10.0 - 20.0 (ug/mL)     Assessment/Plan: 1. 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 Physiatrist is providing close team supervision and 24 hour management of active medical problems listed below. Physiatrist and rehab team continue to assess barriers to discharge/monitor patient progress toward functional and medical goals.  Cognition: Cognition Orientation Level: Oriented X4 Cognition Orientation Level: Oriented X4  2. Anticoagulation/DVT prophylaxis with Pharmaceutical: Lovenox 3. Pain Management: needs gabapentin for dysesthesia Medical Problem List and Plan:  1. 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 Continue Antivert as needed for dizziness. May need vestibular evaluation  2.. DVT Prophylaxis/Anticoagulation: ASA/SCD/Support hose. Monitor for increased signs of deep vein thrombosis  3. Dysphasia-presently on dysphagia to thin liquid diet. Speech therapy a followup advance diet as tolerated. Monitor for any signs of aspiration  4. Seizure disorder-continue Lamictal 100 mg in the a.m. and 200 mg at bedtime. Monitor closely after recent seizure.  5. Mood-provide emotional support and positive reinforcement.  6. History of alcohol abuse-provide counseling 7.  Diplopia/visual def increased but other CVA deficits decreased will dc meclizine 8.  HTN was on  exforge at home.  On amlodipine 5mg  today and monitor increase to 10mg  if no better FACE to [facility] meeting   He would like to go home on Monday  [name],Connor E 10/06/2011, 6:12 AM

## 2011-10-16 NOTE — Progress Notes (Signed)
Occupational Therapy Note  Patient Details  Name: Connor [name] MRN: [id number] Date of Birth: [birth year] Today's Date: 10/16/2011 Group Therapy Time:  1400-1500  (60 mins) Pain:  None Precautions:  Fall  Pt. Engaged in standing balance, coordination of Upper extremity,  Gaze stabilization, and functional balance. Pt. Needed supervision for standing balance, and functional mobility.  Reminded pt about core body stabilization which he said he was doing.    Humberto Seals 10/16/2011, 4:26 PM

## 2011-10-16 NOTE — Progress Notes (Signed)
Min Assist walker. Pt moves about in wc predominately.Head ache pain controlled by current medication regimen. No skin problems. Good appetite. Continent of bowels and bladder. BM today.  No poor safety practices noted. Participating with therapies. Continue plan of care

## 2011-10-17 MED ORDER — GABAPENTIN 100 MG PO CAPS: 100.0000 mg | ORAL_CAPSULE | Freq: Three times a day (TID) | ORAL | Status: DC

## 2011-10-17 MED ORDER — ASPIRIN 325 MG PO TABS: 325.0000 mg | ORAL_TABLET | Freq: Every day | ORAL | Status: DC

## 2011-10-17 MED ORDER — LAMOTRIGINE 100 MG PO TABS: 100.0000 mg | ORAL_TABLET | Freq: Two times a day (BID) | ORAL | Status: DC

## 2011-10-17 NOTE — Progress Notes (Signed)
Patient ID: Connor [name], male   DOB: 02-01-1946, 65 y.o.   MRN: [id number]  Subjective/Complaints: .  Blurring of vision overall improved off antivert.  Headache L side face pain improving on gabapentin  Review of Systems  Neurological: Positive for tingling, sensory change and speech change.  All other systems reviewed and are negative. +diplopia  Objective: Vital Signs: Blood pressure 134/84, pulse 91, temperature 97.8 F (36.6 C), temperature source Oral, resp. rate 19, height 5\' 11"  (1.803 m), weight 91 kg (200 lb 9.9 oz), SpO2 94.00%. No results found. No results found for this or any previous visit (from the past 72 hour(s)).    Constitutional: He is oriented to person, place, and time. He appears well-developed.  HENT:  Head: Normocephalic.  Neck: Normal range of motion. Neck supple. No thyromegaly present.  Cardiovascular: Normal rate and regular rhythm.  Pulmonary/Chest: Effort normal. No respiratory distress.  Abdominal: He exhibits no distension. There is no guarding.  Musculoskeletal: He exhibits no edema. L upper trap tender Neurological: He is alert and oriented to person, place, and time. He has normal reflexes. A cranial nerve deficit and sensory deficit is present.  Patient with left ptosis and mild left-sided facial sensory loss. LUE limb ataxia resolved , He did have double vision clears with Left lateral gaze and eye closure although no gross disconjugate gaze is seen. No nystagmus was appreciated. Patient did not experience vertigo. He had mild right arm and leg sensory loss to pain and temperature. Strength is grossly 4-4+ out of 5 bilaterally. Cognitively he is intact.  Skin: Skin is warm and dry.  Psychiatric: He has a normal mood and affect  Assessment/Plan: 1. 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 stable for D/C today outpt PT/OT recomeended Mobility: Bed Mobility Bed Mobility: No Transfers Sit to Stand: 4: Min assist Stand  to Sit: 3: Mod assist Stand Pivot Transfers: 3: Mod assist Ambulation/Gait Ambulation/Gait Assistance: 3: Mod assist Ambulation/Gait Assistance Details (indicate cue type and reason): left lateropulsion, left LE narrows BOS, ataxic LE movement Ambulation Distance (Feet): 5 Feet Assistive device: 1 person hand held assist Stairs: Yes Stairs Assistance: 4: Min assist Stair Management Technique: Two rails;Step to pattern Number of Stairs: 10  Height of Stairs: 6  Wheelchair Mobility Wheelchair Mobility: Yes Wheelchair Assistance: 4: Systems analyst: Both lower extermities Wheelchair Parts Management: Needs assistance Distance: 150  ADL:    Cognition: Cognition Overall Cognitive Status: Appears within functional limits for tasks assessed Arousal/Alertness: Awake/alert Orientation Level: Oriented X4 Attention: Alternating Selective Attention: Appears intact Alternating Attention: Appears intact Memory: Appears intact Awareness: Appears intact Problem Solving: Appears intact Problem Solving Impairment: Verbal complex Behaviors: Impulsive Safety/Judgment: Appears intact Comments: WFL patient appers at baseline Cognition Arousal/Alertness: Awake/alert Orientation Level: Oriented X4 12 10/17/2011, 10:38 AM    Medical Problem List and Plan:  1. 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 Continue Antivert as needed for dizziness. May need vestibular evaluation  2.. DVT Prophylaxis/Anticoagulation: ASA/SCD/Support hose. Monitor for increased signs of deep vein thrombosis  3. Dysphasia-presently on dysphagia to thin liquid diet. Speech therapy a followup advance diet as tolerated. Monitor for any signs of aspiration  4. Seizure disorder-continue Lamictal 100 mg in the a.m. and 200 mg at bedtime. Monitor closely after recent seizure.  5. Mood-provide emotional support and positive reinforcement.  6. History of  alcohol abuse-provide counseling 7.  Diplopia/visual def increased but other CVA deficits decreased will dc meclizine 8.  HTN was on exforge at home.   controlled on amlodipine 5mg    F/u with primary MD and PMR  [name]

## 2011-10-17 NOTE — Progress Notes (Signed)
Occupational Therapy Discharge Summary  Patient Details  Name: Connor [name] MRN: [id number] Date of Birth: November 07, 1945 Today's Date: 10/17/2011 Time: 0900 - 0930 30 min Pain - none  Patient has met 10 of 10 long term goals due to improved activity tolerance, improved balance, postural control, ability to compensate for deficits, improved awareness and improved coordination.  Patient's care partner is independent to provide the necessary physical assistance at discharge.    Assessment: Pt is completing ADL @ mod I level w/c level. Supervision with toilet and shower transfers with grab bars. R and L UE WFL for ADL. Pt with minimal decreased gross coordination LUE secondary to ataxia. Pt reports that his vision has improved, but continues to report visual changes since CVA. Pt given written visual exercises and coordination exercises. Pt continues to demonstrate balance deficits with dynamic standing and loses balance at times posteriorly and to left. Therefore, recommending Supervision with all transfers. Pt appears to demonstrate STM deficits, most likely premorbid.   Recommendation:  Patient will continue to received OT services in a home health setting to continue to advance functional skills in the area of ADL and iADL.  Equipment: Equipment provided: w/c, w/c cushion, RW  Patient/family agrees with progress made and goals achieved: Yes  Skilled Intervention: Pt/family education regarding ADL, mobility for ADL and home safety.  Keni Wafer,HILLARY 10/17/2011, 10:47 AM

## 2011-10-17 NOTE — Progress Notes (Signed)
Physical Therapy Note  Patient Details  Name: Connor [name] MRN: [id number] Date of Birth: Feb 08, 1946 Today's Date: 10/17/2011  Time In:  0830  Time Out:  0914.  Patient denies any pain.  Individual session focusing on ADL retraining at shower level with emphasis on functional ambulation with rolling walker, sit to stand, dynamic standing balance, safety awareness, shower transfers, toilet transfers.  Patient is going home today and son will be in for discharge teaching with PT prior to discharge.  Patient has grab bars around shower and toilet and built in shower seat at home.  Patient is aware that he should be supervised at all times for mobility or any activity requiring standing.     Norton Pastel 10/17/2011, 10:12 AM

## 2011-10-17 NOTE — Progress Notes (Signed)
Physical Therapy Discharge Summary  Patient Details  Name: Connor [name] MRN: [id number] Date of Birth: [birth year] Today's Date: 10/17/2011  Patient is a 65 y.o male admitted to CIR s/p acute CVA. Patient has made significant progress with PT during CIR stay, from mod assist transfers, min assist W/C mobility, total assist gait, and mod assist stair negotiation to current modified independent W/C level with min assist for gait and close supervision for stair negotiation. Left lateropulsion has resolved, however with fatigue intermittent left lateral lean with narrow BOS emerges. Patient is not always able to sense this due to decrease position sense of left LE functionally, therefore min assist for all standing and gait activities is recommended for home. Bilat LE ataxia is present, however more mild than upon initial evaluation. Team recommended increasing LOS to 10/24/11, however patient and family decided to discharge early on 10/17/11. For this reason, long term goals for gait are not met at this time Patient and family education completed on 10/17/11, and they are independent for discharge home at supervision W/C level and min assist for gait and stair negotiation for increased safety and decreased risk of fall.  Recommendation:  Patient will benefit from ongoing skilled PT services in home health setting to continue to advance safe functional mobility and further minimize fall risk and maximize functional independence.  Equipment: Equipment provided: W/C, cushion, RW  Patient/family agrees with progress made and goals achieved: Yes  [name] 10/17/2011, 11:55 AM

## 2011-10-17 NOTE — Progress Notes (Signed)
Pt escorted to private vehicle via wc by Geoffery Spruce, CNA with family at side.

## 2011-10-17 NOTE — Progress Notes (Signed)
Per State Regulation 482.30 This chart was reviewed for medical necessity with respect to the patient's Admission/Duration of stay. Pt has participated in therapies and has met S goals. Pt ready for discharge today. Pt's son coming for education today. Pt and family aware of need for 24/7 supervision.  [name]                 Nurse Care Manager

## 2011-10-17 NOTE — Progress Notes (Signed)
Physical Therapy Session Note  Patient Details  Name: Connor [name] MRN: [id number] Date of Birth: 1946/03/15  Today's Date: 10/17/2011 Time: 1025-1110 Time Calculation (min): 45 min  Precautions: Precautions Precautions: Fall Precaution Comments: Wallenberg's Syndrome with left lateropulsion, LE ataxia left > right Required Braces or Orthoses: No Restrictions Weight Bearing Restrictions: No Other Position/Activity Restrictions: Severe left bias in sitting and standing.  Worsens with activity  Short Term Goals: PT Short Term Goal 1: Patient will transfer with modified independence. PT Short Term Goal 1 - Progress: met PT Short Term Goal 2: Patient will propel W/C > 150 feet with modified independence, controlled environment. PT Short Term Goal 2 - Progress: met PT Short Term Goal 3: Patient will propel W/C > 50 feet with modified independence in household environment. PT Short Term Goal 3 - Progress: met PT Short Term Goal 4: Patient will gait > 100 feet with supervision in controlled environemt, LRAD. PT Short Term Goal 4 - Progress: Progressing toward goal PT Short Term Goal 5: Patient will negotiate 12 steps with 1 rail with supervision. PT Short Term Goal 5 - Progress: met  Skilled Therapeutic Interventions/Progress Updates:     General Chart Reviewed: Yes Family/Caregiver Present: Yes (son Loraine Leriche)   Pain Pain Assessment Pain Assessment: 0-10 Pain Score:   4 Pain Type: Acute pain Pain Location: Head Pain Orientation: Lower Pain Descriptors: Aching Pain Onset: On-going Patients Stated Pain Goal: 0 Pain Intervention(s): RN made aware;Emotional support Multiple Pain Sites: No  Other Treatments  Son Loraine Leriche present for family education this morning. Family education for discharge at supervision W/C level with min assist for gait with RW. Although patient is modified independent with transfers and close supervision for stair negotiation with therapies, recommend and  trained family at supervision level for transfers and min assist level for stair negotiation to increase safety and further decrease risk of fall at home. Patient and son verbalized and demonstrated understanding for W/C mobility and parts management, squat-pivot transfers, car transfer to sedan height using squat pivot method from W/C, gait with RW and min assist on patient's left side, and stair negotiation with one rail and min assist for safety. Education on energy conservation strategies and home set up also provided with verbalized understanding of patient and son. Discharge re-assessment completed, please see discharge summery for details.  Therapy/Group: Individual Therapy  [name] 10/17/2011, 11:42 AM

## 2011-10-17 NOTE — Progress Notes (Signed)
Social Work  Discharge Note  The overall goal for the admission was met for:   Discharge location: Yes  - home with family  Length of Stay: Yes - 12 days  Discharge activity level: Yes  Home/community participation: Yes  Services provided included: MD, RD, PT, OT, SLP, RN, CM, TR, Pharmacy and SW  Financial Services: Other: [employer] Medicare  Follow-up services arranged: Home Health: RN, PT and OT via [facility], DME: 18x18 breezy w/c with cushion, rolling walker via [facility] and Patient/Family has no preference for HH/DME agencies  Comments (or additional information): also provided info on local Stroke Support Group  Patient/Family verbalized understanding of follow-up arrangements: Yes  Individual responsible for coordination of the follow-up plan: patient  Confirmed correct DME delivered: Connor [name] 10/17/2011    Connor [name]

## 2011-10-20 NOTE — Discharge Summary (Signed)
NAMEJOHANTHAN, KNEELAND Connor.:  [id number]  MEDICAL RECORD Connor.:  [id number]  LOCATION:  4146                         FACILITY:  [facility]  PHYSICIAN:  [name], M.D.DATE OF BIRTH:  1946-02-14  DATE OF ADMISSION:  10/05/2011 DATE OF DISCHARGE:  10/17/2011                              DISCHARGE SUMMARY   DISCHARGE DIAGNOSES: 1. Left lateral medullary infarction and right inferior cerebellar     infarction. 2. Sequential compression devices for deep vein thrombosis     prophylaxis. 3. Dysphagia. 4. Seizure disorder, mood. 5. History of alcohol abuse.  HISTORY:  A 65 year old right-handed male with past history of seizure disorder, chronic alcoholism as well as throat cancer.  The patient with noted left-sided weakness on December 5 while eating breakfast with friends.  CT of the head as well as MRI of the brain with and without contrast were negative.  The patient was discharged home from emergency department.  He was admitted to the hospital December 7 with falls as well as increased left-sided weakness as well as double vision. 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  CT angiogram of the head showed Connor evidence of posterior circulation stenosis or occlusion. TEE with normal ejection fraction and Connor emboli.  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  Maintained on aspirin therapy.  On December 5, the patient with noted seizure lasting for 1 minute.  Neurology Service's followup, MRI of the brain was unchanged. His Lamictal was adjusted.  Connor further seizure activity noted.  He required moderate assist to ambulate 50 feet.  He received followed by speech therapy, maintained on a dysphagia to thin liquid diet.  He was admitted for comprehensive rehab program.  PAST MEDICAL HISTORY:  See discharge diagnoses.  SOCIAL HISTORY:  He lives with his stepson and family with assistance as needed in a 1-level home with 2 steps to  entry.  FUNCTIONAL HISTORY PRIOR TO ADMISSION:  Independent.  FUNCTIONAL STATUS UPON ADMISSION TO REHAB SERVICES:  Minimal assist from sit to stand, ambulating 50 feet with moderate assistance using a rolling walker.  PHYSICAL EXAMINATION:  VITAL SIGNS:  Blood pressure 144/94, pulse 94, temperature 97.8, respirations 18. GENERAL:  This was an alert male, in Connor acute distress.  Appeared well developed. HEENT:  Normocephalic. LUNGS:  Clear to auscultation. CARDIAC:  Regular rate and rhythm. ABDOMEN:  Soft, nontender.  Good bowel sounds.  C NEUROLOGIC:  Connor cranial nerve deficits noted.  The patient with left ptosis and mild left-sided facial sensory loss.  He had some left-sided limb ataxia.  HOSPITAL COURSE:  The patient was admitted to inpatient rehab services with therapies initiated on a 3-hour daily basis consisting of physical therapy, occupational therapy, speech therapy, and rehabilitation nursing.  The following issues were addressed during the patient's rehabilitation stay.  Pertaining to Mr. Brideau' left lateral medullary and right inferior cerebellar infarction remained stable, maintained on aspirin therapy.  He did continue on Antivert for dizziness with good results.  His diet was slowly upgraded as per Speech Therapy with Connor signs of aspiration.  He remained on Lamictal for history of seizure disorder 100 mg in the a.m., 200 mg at bedtime.  Connor  further seizure activity was noted.  He did have a history of alcohol abuse.  He received full counseling in regards to cessation of alcohol.  It was questionable if he would be compliant with these requests.  His blood pressures remained well controlled.  Connor orthostatic changes.  The patient received weekly collaborative interdisciplinary team conferences to discuss estimated length of stay, family teaching, and any barriers to discharge.  He was continent of bowel and bladder.  He required moderate assist with basic self-care  skills.  He had Connor complaints of pain.  His communication was at baseline his diet had been advanced to a dysphagia 3 thin liquid.  His mobility continued to improve to the fact that he was minimal assist for transfers as well as wheelchair mobility, ambulating 25 feet with a rolling walker.  Full family teaching was completed and plan was to be discharged to home.  DISCHARGE MEDICATIONS: 1. Antivert 50 mg 3 times daily. 2. Aspirin 325 mg daily. 3. Norvasc 5 mg daily. 4. Neurontin 100 mg 3 times daily. 5. Lamictal 100 mg a.m., 200 mg in the p.m. 6. Zocor 20 mg daily. 7. Ultram 50 mg every 6 hours as needed pain or headache, dispense of     60 tablets.  His diet was a mechanical soft thin liquid diet.  Ongoing therapies were dictated as per rehab services.  He was advised to follow up with his primary care provider in 2 weeks.  Arrangements were made to follow up in the outpatient rehab center with Dr. Wynn Banker.  He was advised Connor driving or alcohol.     Mariam Dollar, P.A.   ______________________________ [name], M.D.    DA/MEDQ  D:  10/20/2011  T:  10/20/2011  Job:  [id number]

## 2011-11-21 ENCOUNTER — Encounter: Payer: Medicare Other | Attending: Physical Medicine & Rehabilitation | Admitting: Physical Medicine & Rehabilitation

## 2011-11-21 DIAGNOSIS — I69998 Other sequelae following unspecified cerebrovascular disease: Secondary | ICD-10-CM | POA: Insufficient documentation

## 2011-11-21 DIAGNOSIS — I10 Essential (primary) hypertension: Secondary | ICD-10-CM

## 2011-11-21 DIAGNOSIS — I6330 Cerebral infarction due to thrombosis of unspecified cerebral artery: Secondary | ICD-10-CM

## 2011-11-21 DIAGNOSIS — R279 Unspecified lack of coordination: Secondary | ICD-10-CM | POA: Insufficient documentation

## 2011-11-21 NOTE — Assessment & Plan Note (Signed)
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  He has been doing fairly well with therapies.  He still has some problems with balance, but really has progressed to where he is independent around the home.  He uses a cane for longer distances.  He has been progressing to where he is playing some basketball with his grandson.  He has occasional headache on the left frontotemporal area that usually starts with TV viewing or when he strains his eyes a bit.  If he rests, it seems to improve a bit.  He only rates the pain 1/10.  He still has some numbness on the right side. He does some short distance driving around the house without any problems.  Blood pressure has been under better control for the most part.  He brought up another blood pressure diary with him today.  REVIEW OF SYSTEMS:  Notable for the above items.  Full 12-point review is in the written health and history section of the chart.  He reports no bowel and bladder or swallowing issues currently.  SOCIAL HISTORY:  Unchanged.  PHYSICAL EXAMINATION:  VITAL SIGNS:  Blood pressure is 139/68, pulse 87, respiratory rate 18, he is  satting 100% on room air. GENERAL:  The patient is pleasant, alert.  He moves all 4 limbs. MUSCULOSKELETAL:  His strength is grossly 5/5 except perhaps the right arm which may be 4+ out of 5. NEUROLOGIC:  Cranial nerve exam is notable for some nystagmus with left end gaze more than right.  I saw 4-5 beats laterally to the left.  He had intact visual acuity.  There was decreased sensation over the left side of the face.  No tongue deviation was seen.  Speech is clear and cognition is appropriate.  He has diminished sensation of the right arm and leg at 1 out of 2, but can sense gross touch.  He has hard time distinguishing painful touch, however.  He does have diminished fine motor coordination of the left arm and leg, but has improved.  He tends to lean  a bit to the left during Romberg testing.  Pronator drift was equivocal.  He ambulated for me today and did fairly well with his normal gait, width, and stride, however when we moved heel-to-toe tended to be compensating, had a hard time stabilizing the right to advance to the left and then had a hard time advancing the left at times in a coordinated manner.  He was quite stable with his cane.  ASSESSMENT: 1. 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 2. Hypertension.  PLAN: 1. The patient is doing quite nicely at this point.  I did give him     permission to drive short distances, i.e. during the day and less     than 10 minutes. 2. Continue working on visual and vestibular exercises.  I recommended     that if he continues to have issues with his visual acuity after     the next 6-8 weeks,  he might want to see an ophthalmologist or     optometrist to have his vision checked to see if he needs     corrective lens wear. 3. I refilled his Norvasc today 10 mg daily. 4. Continue with exercises for strength and coordination.  I would     expect him to have a lot more improvement over the next several     months.  However,  the sensory changes and vestibular changes are     often hard to predict as far as return is concerned.  The patient     still uses cane for uneven surfaces and when he is out of the house     for longer periods of time. 5. I will see him back in 3 months.  I am very pleased with his     progress.     Ranelle Oyster, M.D. Electronically Signed    ZTS/MedQ D:  11/21/2011 11:13:10  T:  11/21/2011 20:03:58  Job #:  [id number]  cc:   Pearla Dubonnet, M.D. Fax: [phone]

## 2011-12-04 ENCOUNTER — Emergency Department (HOSPITAL_COMMUNITY): Payer: Medicare Other

## 2011-12-04 ENCOUNTER — Other Ambulatory Visit: Payer: Self-pay

## 2011-12-04 ENCOUNTER — Encounter (HOSPITAL_COMMUNITY): Payer: Self-pay | Admitting: Emergency Medicine

## 2011-12-04 ENCOUNTER — Inpatient Hospital Stay (HOSPITAL_COMMUNITY)
Admission: EM | Admit: 2011-12-04 | Discharge: 2011-12-05 | DRG: 149 | Disposition: A | Discharge status: Home / Self Care | Payer: Medicare Other | Attending: Internal Medicine | Admitting: Internal Medicine

## 2011-12-04 DIAGNOSIS — Z96659 Presence of unspecified artificial knee joint: Secondary | ICD-10-CM

## 2011-12-04 DIAGNOSIS — Z8589 Personal history of malignant neoplasm of other organs and systems: Secondary | ICD-10-CM

## 2011-12-04 DIAGNOSIS — I10 Essential (primary) hypertension: Secondary | ICD-10-CM | POA: Diagnosis present

## 2011-12-04 DIAGNOSIS — I129 Hypertensive chronic kidney disease with stage 1 through stage 4 chronic kidney disease, or unspecified chronic kidney disease: Secondary | ICD-10-CM | POA: Diagnosis present

## 2011-12-04 DIAGNOSIS — R42 Dizziness and giddiness: Secondary | ICD-10-CM | POA: Diagnosis present

## 2011-12-04 DIAGNOSIS — E782 Mixed hyperlipidemia: Secondary | ICD-10-CM | POA: Diagnosis present

## 2011-12-04 DIAGNOSIS — N189 Chronic kidney disease, unspecified: Secondary | ICD-10-CM | POA: Diagnosis present

## 2011-12-04 DIAGNOSIS — G40909 Epilepsy, unspecified, not intractable, without status epilepticus: Secondary | ICD-10-CM | POA: Diagnosis present

## 2011-12-04 DIAGNOSIS — G902 Horner's syndrome: Secondary | ICD-10-CM | POA: Diagnosis present

## 2011-12-04 DIAGNOSIS — Z8673 Personal history of transient ischemic attack (TIA), and cerebral infarction without residual deficits: Secondary | ICD-10-CM

## 2011-12-04 DIAGNOSIS — Z85528 Personal history of other malignant neoplasm of kidney: Secondary | ICD-10-CM

## 2011-12-04 DIAGNOSIS — Z79899 Other long term (current) drug therapy: Secondary | ICD-10-CM

## 2011-12-04 DIAGNOSIS — I639 Cerebral infarction, unspecified: Secondary | ICD-10-CM | POA: Diagnosis present

## 2011-12-04 DIAGNOSIS — E785 Hyperlipidemia, unspecified: Secondary | ICD-10-CM | POA: Diagnosis present

## 2011-12-04 DIAGNOSIS — H81319 Aural vertigo, unspecified ear: Principal | ICD-10-CM | POA: Diagnosis present

## 2011-12-04 DIAGNOSIS — M129 Arthropathy, unspecified: Secondary | ICD-10-CM | POA: Diagnosis present

## 2011-12-04 HISTORY — DX: Hyperlipidemia, unspecified: E78.5

## 2011-12-04 LAB — URINALYSIS, ROUTINE W REFLEX MICROSCOPIC
Ketones, ur: NEGATIVE mg/dL
Leukocytes, UA: NEGATIVE
Protein, ur: NEGATIVE mg/dL
Urobilinogen, UA: 0.2 mg/dL (ref 0.0–1.0)

## 2011-12-04 LAB — POCT I-STAT, CHEM 8
Chloride: 101 mEq/L (ref 96–112)
Creatinine, Ser: 1.2 mg/dL (ref 0.50–1.35)
Glucose, Bld: 104 mg/dL — ABNORMAL HIGH (ref 70–99)
HCT: 44 % (ref 39.0–52.0)
Potassium: 4 mEq/L (ref 3.5–5.1)
Sodium: 138 mEq/L (ref 135–145)

## 2011-12-04 LAB — POCT I-STAT TROPONIN I

## 2011-12-04 MED ORDER — MECLIZINE HCL 25 MG PO TABS
ORAL_TABLET | ORAL | Status: AC
  Filled 2011-12-04: qty 1

## 2011-12-04 MED ORDER — ACETAMINOPHEN 500 MG PO TABS
1000.0000 mg | ORAL_TABLET | Freq: Once | ORAL | Status: AC
  Administered 2011-12-04: 1000 mg via ORAL
  Filled 2011-12-04: qty 2

## 2011-12-04 MED ORDER — MECLIZINE HCL 25 MG PO TABS
50.0000 mg | ORAL_TABLET | Freq: Once | ORAL | Status: AC
  Administered 2011-12-04: 50 mg via ORAL
  Filled 2011-12-04: qty 2

## 2011-12-04 NOTE — ED Notes (Signed)
Received bedside report from [place], [place].  Patient currently resting quietly in bed; no respiratory or acute distress noted.  Updated patient on plan of care; informed patient that a bed request has been put in and that we are currently waiting on a bed to become available.  Patient has no other questions or concerns at this time; will continue to monitor.

## 2011-12-04 NOTE — ED Notes (Signed)
Dropped one 25mg  meclizine tablet on floor; wasted in pyxis and overrided for replacement pill

## 2011-12-04 NOTE — Consult Note (Signed)
Referring Physician: Clarene Duke    Chief Complaint: Vertigo and blurred vision  HPI: Connor [name] is an 66 y.o. male s/p infarct in December of 2012 who reports that on yesterday had the acute onset of vertigo, particularly with head movement, and increased blurring of vision.  The patient reports that he has some blurred vision as a residual from his last infarct but his current symptoms are worse.  He also has some balance deficits and right sensory loss as a consequence of his last infarct.  Patient's blood pressure was increased at presentation.  The patient reports that his pressure has not been completely controlled since his stroke.  LSN: 12/03/2011 tPA Given: No: Outside time window mRankin: 1  Past Medical History  Diagnosis Date  . Head and neck cancer ~ 2009    S/P radiation & chem  . Stroke   . Hypertension   . Seizures     "the bad kind;bite tongue;see Dr.Love; last 07/2011 & 08/2011"  . Chronic kidney disease   . Cancer     h/o neck  . Kidney carcinoma     h/o  . Arthritis     Past Surgical History  Procedure Date  . Nephrectomy 1990's    left  . Joint replacement   . Total knee arthroplasty 2011    left  . Hydrocelectomy 11/2000    left  . Tee without cardioversion 10/04/2011    Procedure: TRANSESOPHAGEAL ECHOCARDIOGRAM (TEE);  Surgeon: Donato Schultz, MD;  Location[facility] ENDOSCOPY;  Service: Cardiovascular;  Laterality: N/A;    No family history on file. Social History:  reports that he has never smoked. He has never used smokeless tobacco. He reports that he drinks about 12 ounces of alcohol per week. He reports that he does not use illicit drugs.  Allergies:  Allergies  Allergen Reactions  . Morphine And Related Nausea And Vomiting    Medications: I have reviewed the patient's current medications. Prior to Admission:  Norvasc, Neurontin, Lamictal, Zocor  ROS: History obtained from the patient  General ROS: negative for - chills, fatigue, fever, night  sweats, weight gain or weight loss Psychological ROS: negative for - behavioral disorder, hallucinations, memory difficulties, mood swings or suicidal ideation Ophthalmic ROS: as noted in HPI ENT ROS: negative for - epistaxis, nasal discharge, oral lesions, sore throat or tinnitus  Allergy and Immunology ROS: negative for - hives or itchy/watery eyes Hematological and Lymphatic ROS: negative for - bleeding problems, bruising or swollen lymph nodes Endocrine ROS: negative for - galactorrhea, hair pattern changes, polydipsia/polyuria or temperature intolerance Respiratory ROS: negative for - cough, hemoptysis, shortness of breath or wheezing Cardiovascular ROS: negative for - chest pain, dyspnea on exertion, edema or irregular heartbeat Gastrointestinal ROS: negative for - abdominal pain, diarrhea, hematemesis, nausea/vomiting or stool incontinence Genito-Urinary ROS: negative for - dysuria, hematuria, incontinence or urinary frequency/urgency Musculoskeletal ROS: negative for - joint swelling or muscular weakness Neurological ROS: as noted in HPI Dermatological ROS: negative for rash and skin lesion changes  Physical Examination: Blood pressure 160/98, pulse 98, temperature 97.8 F (36.6 C), temperature source Oral, resp. rate 18, SpO2 99.00%.  Neurologic Examination: Mental Status: Alert, oriented, thought content appropriate.  Speech fluent without evidence of aphasia.  Able to follow 3 step commands without difficulty. Cranial Nerves: II: visual fields grossly normal, pupils equal, round, reactive to light and accommodation III,IV, VI: mild left ptosis, extra-ocular motions intact bilaterally with nystagmus noted on upward and right lateral gaze V,VII: smile symmetric, facial  light touch sensation decreased on the right VIII: hearing normal bilaterally IX,X: gag reflex present XI: trapezius strength/neck flexion strength normal bilaterally XII: tongue deviation to the  left Motor: Right : Upper extremity   5/5    Left:     Upper extremity   5/5  Lower extremity   5/5     Lower extremity   5/5 Tone and bulk:normal tone throughout; no atrophy noted Sensory: Pinprick and light touch decreased on the right Deep Tendon Reflexes: 2+ and symmetric with absent AJ's bilaterally Plantars: Right: mute   Left: downgoing Cerebellar: normal finger-to-nose bilaterally.  Heel-to-shin with dysmetria bilaterally  No results found for this or any previous visit (from the past 48 hour(s)). No results found.  Assessment: 66 y.o. male presenting with acute onset vertigo and worsening of vision.  Patient hypertensive as well with BP of 141/125.  Exam consistent with residual from old infarct.  Can not rule out new ischemic event or bleed.  Stroke Risk Factors - hypertension and past history of stroke  Plan: 1. Head CT without contrast tonight 2. If CT unremarkable patient to be admitted and have MRI, MRA  of the brain without contrast in the morning 3. PT consult, OT consult, Speech consult 4. Remainder of stroke work up performed in December and would not schedule to repeat at this time.   5. If CT unremarkable patient to start prophylactic therapy-Antiplatelet med: Aspirin - dose 325mg  daily 6. BP control 7. Telemetry monitoring if admission required   Thana Farr, MD [facility] [phone] 12/04/2011, 8:18 PM

## 2011-12-04 NOTE — H&P (Signed)
Connor [name] is an 66 y.o. male.   PCP - Dr.Neville Gates. Chief Complaint: Dizziness and blurred vision. HPI: 55 old male with history of stroke in December 2012 and subsequently had some persistent numbness in his right upper and lower extremity and also uses a cane for walking due to balance issues presented with complaints of having blurred vision which are of yesterday around 3 PM after he had a beer. The blurred vision persisted through the day and when he woke up today morning he started having dizziness. His days and is mostly positional and when he lies down he does not have any dizziness. Denies any difficulty speaking or swallowing. Denies any focal deficits or loss of function of upper or lower extremities. Patient had a CT of the head in the ER and was evaluated by the neurologist. At this time and has recommended admission under the medicine and an MRI/MRA of the brain. Patient denies any chest pain shortness breath nausea vomiting abdominal pain headache fever chills cough or phlegm.  Past Medical History  Diagnosis Date  . Head and neck cancer ~ 2009    S/P radiation & chem  . Stroke   . Hypertension   . Seizures     "the bad kind;bite tongue;see Dr.Love; last 07/2011 & 08/2011"  . Chronic kidney disease   . Cancer     h/o neck  . Kidney carcinoma     h/o  . Arthritis   . Hyperlipidemia     Past Surgical History  Procedure Date  . Nephrectomy 1990's    left  . Joint replacement   . Total knee arthroplasty 2011    left  . Hydrocelectomy 11/2000    left  . Tee without cardioversion 10/04/2011    Procedure: TRANSESOPHAGEAL ECHOCARDIOGRAM (TEE);  Surgeon: Donato Schultz, MD;  Location: [facility] ENDOSCOPY;  Service: Cardiovascular;  Laterality: N/A;    History reviewed. No pertinent family history. Social History:  reports that he has never smoked. He has never used smokeless tobacco. He reports that he drinks about 12 ounces of alcohol per week. He reports that he does not  use illicit drugs.  Allergies:  Allergies  Allergen Reactions  . Morphine And Related Nausea And Vomiting    Medications Prior to Admission  Medication Dose Route Frequency Provider Last Rate Last Dose  . acetaminophen (TYLENOL) tablet 1,000 mg  1,000 mg Oral Once Laray Anger, DO   1,000 mg at 12/04/11 2019  . meclizine (ANTIVERT) tablet 50 mg  50 mg Oral Once Laray Anger, DO   50 mg at 12/04/11 1928   Medications Prior to Admission  Medication Sig Dispense Refill  . amLODipine (NORVASC) 5 MG tablet Take 1 tablet (5 mg total) by mouth daily.  30 tablet  1  . gabapentin (NEURONTIN) 100 MG capsule Take 100 mg by mouth 3 (three) times daily as needed. For pain      . lamoTRIgine (LAMICTAL) 100 MG tablet Take 100-200 mg by mouth See admin instructions. Take one pill in morning and two pills at bedtime      . simvastatin (ZOCOR) 20 MG tablet Take 1 tablet (20 mg total) by mouth daily at 6 PM.  30 tablet  1    Results for orders placed during the hospital encounter of 12/04/11 (from the past 48 hour(s))  POCT I-STAT TROPONIN I     Status: Normal   Collection Time   12/04/11  9:34 PM  Component Value Range Comment   Troponin i, poc 0.00  0.00 - 0.08 (ng/mL)    Comment 3            POCT I-STAT, CHEM 8     Status: Abnormal   Collection Time   12/04/11  9:36 PM      Component Value Range Comment   Sodium 138  135 - 145 (mEq/L)    Potassium 4.0  3.5 - 5.1 (mEq/L)    Chloride 101  96 - 112 (mEq/L)    BUN 11  6 - 23 (mg/dL)    Creatinine, Ser 8.41  0.50 - 1.35 (mg/dL)    Glucose, Bld 324 (*) 70 - 99 (mg/dL)    Calcium, Ion 4.01  1.12 - 1.32 (mmol/L)    TCO2 27  0 - 100 (mmol/L)    Hemoglobin 15.0  13.0 - 17.0 (g/dL)    HCT 02.7  25.3 - 66.4 (%)   URINALYSIS, ROUTINE W REFLEX MICROSCOPIC     Status: Normal   Collection Time   12/04/11 10:04 PM      Component Value Range Comment   Color, Urine YELLOW  YELLOW     APPearance CLEAR  CLEAR     Specific Gravity, Urine 1.013   1.005 - 1.030     pH 7.0  5.0 - 8.0     Glucose, UA NEGATIVE  NEGATIVE (mg/dL)    Hgb urine dipstick NEGATIVE  NEGATIVE     Bilirubin Urine NEGATIVE  NEGATIVE     Ketones, ur NEGATIVE  NEGATIVE (mg/dL)    Protein, ur NEGATIVE  NEGATIVE (mg/dL)    Urobilinogen, UA 0.2  0.0 - 1.0 (mg/dL)    Nitrite NEGATIVE  NEGATIVE     Leukocytes, UA NEGATIVE  NEGATIVE  MICROSCOPIC NOT DONE ON URINES WITH NEGATIVE PROTEIN, BLOOD, LEUKOCYTES, NITRITE, OR GLUCOSE <1000 mg/dL.   Dg Chest 2 View  12/04/2011  *RADIOLOGY REPORT*  Clinical Data: Dizziness and lightheaded.  CHEST - 2 VIEW  Comparison: 10/01/2011  Findings: Normal heart size and pulmonary vascularity.  Lungs appear clear expanded.  No focal airspace consolidation.  No blunting of costophrenic angles.  No pneumothorax.  Surgical clips in the upper abdomen.  Degenerative changes in the spine.  Stable appearance since previous study.  IMPRESSION: No evidence of active pulmonary disease.  Original Report Authenticated By: Marlon Pel, M.D.   Ct Head Wo Contrast  12/04/2011  *RADIOLOGY REPORT*  Clinical Data: Weakness and dizziness.  CT HEAD WITHOUT CONTRAST  Technique:  Contiguous axial images were obtained from the base of the skull through the vertex without contrast.  Comparison: Head CT 10/01/2011.  Findings: The ventricles are normal.  No extra-axial fluid collections are seen.  The brainstem and cerebellum are unremarkable.  No acute intracranial findings such as infarction or hemorrhage.  The bony structures are intact.  Minimal scattered sinus disease. The mastoid air cells are clear.  The globes are intact.  IMPRESSION: No acute intracranial findings or mass lesions.  Original Report Authenticated By: P. Loralie Champagne, M.D.    Review of Systems  Constitutional: Negative.   HENT: Negative.   Eyes: Positive for blurred vision.  Respiratory: Negative.   Cardiovascular: Negative.   Gastrointestinal: Negative.   Genitourinary: Negative.     Musculoskeletal: Negative.   Skin: Negative.   Neurological: Positive for dizziness.  Endo/Heme/Allergies: Negative.   Psychiatric/Behavioral: Negative.     Blood pressure 152/82, pulse 89, temperature 97.7 F (36.5 C), temperature source Oral,  resp. rate 18, SpO2 96.00%. Physical Exam  Constitutional: He is oriented to person, place, and time. He appears well-developed and well-nourished. No distress.  HENT:  Head: Normocephalic and atraumatic.  Right Ear: External ear normal.  Left Ear: External ear normal.  Nose: Nose normal.  Mouth/Throat: Oropharynx is clear and moist. No oropharyngeal exudate.  Eyes: Conjunctivae and EOM are normal. Pupils are equal, round, and reactive to light. Right eye exhibits no discharge. Left eye exhibits no discharge. No scleral icterus.  Neck: Normal range of motion. Neck supple.  Cardiovascular: Normal rate, regular rhythm and normal heart sounds.   Respiratory: Effort normal and breath sounds normal. No respiratory distress. He has no wheezes. He has no rales.  GI: Soft. Bowel sounds are normal. He exhibits no distension. There is no tenderness. There is no rebound.  Musculoskeletal: Normal range of motion. He exhibits no edema and no tenderness.  Neurological: He is alert and oriented to person, place, and time.       Moves all limbs 5/5. No obvious facial asymmetry.  Skin: Skin is warm and dry. No rash noted. He is not diaphoretic. No erythema.  Psychiatric: His behavior is normal.     Assessment/Plan #1. Blurred vision with dizziness and history of acute CVA in December 2012  - this time the neurologist has already evaluated the patient and has advised MRI MRA brain. Patient will placed on neurochecks and a swallow evaluation. Further workup based on MRI findings. As patient has had recent carotid Doppler and 2-D echo and TEE neurologist has recommended not to repeat it. #2. History of hypertension, hyperlipidemia and seizures - continue present  medications.  I have left a message for Dr. Johnella Moloney about this admission.  CODE STATUS  - full code.   Acxel Dingee N. 12/04/2011, 11:05 PM

## 2011-12-04 NOTE — ED Provider Notes (Signed)
History     CSN: [id number]  Arrival date & time 12/04/11  1725   First MD Initiated Contact with Patient 12/04/11 1850      Chief Complaint  Patient presents with  . Dizziness    HPI Pt was seen at 1900.  Per pt, c/o gradual onset and persistence of intermittent "dizziness" since yesterday.  Pt describes the dizziness as "spinning," which worsens when he turns his head from side to side or changes body positions.  Has been assoc with recent URI symptoms and frontal/left headache.  Denies fevers, no CP/palpitations, no SOB/cough, no abd pain, no N/V/D.  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   Past Medical History  Diagnosis Date  . Head and neck cancer ~ 2009    S/P radiation & chem  . Stroke   . Hypertension   . Seizures     "the bad kind;bite tongue;see Dr.Love; last 07/2011 & 08/2011"  . Chronic kidney disease   . Cancer     h/o neck  . Kidney carcinoma     h/o  . Arthritis     Past Surgical History  Procedure Date  . Nephrectomy 1990's    left  . Joint replacement   . Total knee arthroplasty 2011    left  . Hydrocelectomy 11/2000    left  . Tee without cardioversion 10/04/2011    Procedure: TRANSESOPHAGEAL ECHOCARDIOGRAM (TEE);  Surgeon: Donato Schultz, MD;  Location: [facility] ENDOSCOPY;  Service: Cardiovascular;  Laterality: N/A;     History  Substance Use Topics  . Smoking status: Never Smoker   . Smokeless tobacco: Never Used  . Alcohol Use: 12.0 oz/week    20 Cans of beer per week    Review of Systems ROS: Statement: All systems negative except as marked or noted in the HPI; Constitutional: Negative for fever and chills. ; ; Eyes: Negative for eye pain, redness and discharge. ; ; ENMT: Negative for ear pain, hoarseness, sore throat, +nasal congestion, sinus pressure ; ; Cardiovascular: Negative for chest pain, palpitations, diaphoresis, dyspnea and peripheral edema. ; ;  Respiratory: Negative for cough, wheezing and stridor. ; ; Gastrointestinal: Negative for nausea, vomiting, diarrhea, abdominal pain, blood in stool, hematemesis, jaundice and rectal bleeding. . ; ; Genitourinary: Negative for dysuria, flank pain and hematuria. ; ; Musculoskeletal: Negative for back pain and neck pain. Negative for swelling and trauma.; ; Skin: Negative for pruritus, rash, abrasions, blisters, bruising and skin lesion.; ; Neuro: +vertigo, frontal and left headache. Negative for lightheadedness and neck stiffness. Negative for weakness, altered level of consciousness , altered mental status, extremity weakness, paresthesias, involuntary movement, seizure and syncope.     Allergies  Morphine and related  Home Medications   Current Outpatient Rx  Name Route Sig Dispense Refill  . AMLODIPINE BESYLATE 5 MG PO TABS Oral Take 1 tablet (5 mg total) by mouth daily. 30 tablet 1  . GABAPENTIN 100 MG PO CAPS Oral Take 100 mg by mouth 3 (three) times daily as needed. For pain    . LAMOTRIGINE 100 MG PO TABS Oral Take 100-200 mg by mouth See admin instructions. Take one pill in morning and two pills at bedtime    . SIMVASTATIN 20 MG PO TABS Oral Take 1 tablet (20 mg total) by mouth daily at 6 PM. 30 tablet 1    BP 160/98  Pulse 98  Temp(Src) 97.8 F (36.6 C) (Oral)  Resp 18  SpO2 99%  Physical Exam 1905: Physical examination:  Nursing notes reviewed; Vital signs and O2 SAT reviewed;  Constitutional: Well developed, Well nourished, Well hydrated, In no acute distress; Head:  Normocephalic, atraumatic; Eyes: EOMI, PERRL, No scleral icterus; ENMT: Mouth and pharynx normal, Mucous membranes moist; Neck: Supple, Full range of motion, No lymphadenopathy; Cardiovascular: Regular rate and rhythm, No murmur, rub, or gallop; Respiratory: Breath sounds clear & equal bilaterally, No rales, rhonchi, wheezes, or rub, Normal respiratory effort/excursion; Chest: Nontender, Movement normal; Abdomen: Soft,  Nontender, Nondistended, Normal bowel sounds; Genitourinary: No CVA tenderness; Extremities: Pulses normal, No tenderness, No edema, No calf edema or asymmetry.; Neuro: AA&Ox3, Major CN grossly intact.  Strength 5/5 equal bilat UE's and LE's.  DTR 2/4 equal bilat UE's and LE's.  +decreased subjective sensation to left upper face, RUE, RLE per hx previous CVA, otherwise no new gross sensory deficits.  Normal cerebellar testing bilat UE's and LE's.  Speech clear.  No facial droop.  +left gaze horizontal non-fatigable nystagmus.;; Skin: Color normal, Warm, Dry, no rash.   ED Course  Procedures   1915:  Unable to obtain MRI brain this evening.  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 T/C to Neuro Dr. Thad Ranger, case discussed, including:  HPI, pertinent PM/SHx, VS/PE, proposed dx testing and inability to obtain MRI this evening: will come to ED for eval.   2000:  Neuro MD has eval:  Agrees that physical exam/symptom resolution will be difficult to ascertain with certainty in this pt given his hx.  Will continue with HTN work up, obtain CT-H to r/o acute ICH.  May likely need to be admitted for MRI and further CVA risk factor control.  Pt and family aware and agreeable with plan.   2210:  T/C to [facility], case discussed, including:  HPI, pertinent PM/SHx, VS/PE, dx testing, ED course and treatment.  Agreeable to admit.  Requests to obtain tele bed to team Dr. Dorris Carnes. Gates/team 3.   MDM  MDM Reviewed: previous chart, nursing note and vitals Interpretation: ECG, labs, x-ray and CT scan    Date: 12/04/2011  Rate: 69  Rhythm: normal sinus rhythm  QRS Axis: normal  Intervals: normal  ST/T Wave abnormalities: normal  Conduction Disutrbances:none  Narrative Interpretation:   Old EKG Reviewed: unchanged; no significant changes from previous EKG dated 08/25/2010.  Ct Head Wo Contrast 12/04/2011  *RADIOLOGY REPORT*   Clinical Data: Weakness and dizziness.  CT HEAD WITHOUT CONTRAST  Technique:  Contiguous axial images were obtained from the base of the skull through the vertex without contrast.  Comparison: Head CT 10/01/2011.  Findings: The ventricles are normal.  No extra-axial fluid collections are seen.  The brainstem and cerebellum are unremarkable.  No acute intracranial findings such as infarction or hemorrhage.  The bony structures are intact.  Minimal scattered sinus disease. The mastoid air cells are clear.  The globes are intact.  IMPRESSION: No acute intracranial findings or mass lesions.  Original Report Authenticated By: P. Loralie Champagne, M.D.   Dg Chest 2 View 12/04/2011  *RADIOLOGY REPORT*  Clinical Data: Dizziness and lightheaded.  CHEST - 2 VIEW  Comparison: 10/01/2011  Findings: Normal heart size and pulmonary vascularity.  Lungs appear clear expanded.  No focal airspace consolidation.  No blunting of costophrenic angles.  No pneumothorax.  Surgical clips in the upper abdomen.  Degenerative changes in the spine.  Stable appearance since previous  study.  IMPRESSION: No evidence of active pulmonary disease.  Original Report Authenticated By: Marlon Pel, M.D.    Results for orders placed during the hospital encounter of 12/04/11  URINALYSIS, ROUTINE W REFLEX MICROSCOPIC      Component Value Range   Color, Urine YELLOW  YELLOW    APPearance CLEAR  CLEAR    Specific Gravity, Urine 1.013  1.005 - 1.030    pH 7.0  5.0 - 8.0    Glucose, UA NEGATIVE  NEGATIVE (mg/dL)   Hgb urine dipstick NEGATIVE  NEGATIVE    Bilirubin Urine NEGATIVE  NEGATIVE    Ketones, ur NEGATIVE  NEGATIVE (mg/dL)   Protein, ur NEGATIVE  NEGATIVE (mg/dL)   Urobilinogen, UA 0.2  0.0 - 1.0 (mg/dL)   Nitrite NEGATIVE  NEGATIVE    Leukocytes, UA NEGATIVE  NEGATIVE   POCT I-STAT, CHEM 8      Component Value Range   Sodium 138  135 - 145 (mEq/L)   Potassium 4.0  3.5 - 5.1 (mEq/L)   Chloride 101  96 - 112 (mEq/L)   BUN 11   6 - 23 (mg/dL)   Creatinine, Ser 1.61  0.50 - 1.35 (mg/dL)   Glucose, Bld 096 (*) 70 - 99 (mg/dL)   Calcium, Ion 0.45  4.09 - 1.32 (mmol/L)   TCO2 27  0 - 100 (mmol/L)   Hemoglobin 15.0  13.0 - 17.0 (g/dL)   HCT 81.1  91.4 - 78.2 (%)  POCT I-STAT TROPONIN I      Component Value Range   Troponin i, poc 0.00  0.00 - 0.08 (ng/mL)   Comment 3                   Laray Anger, DO 12/05/11 1849

## 2011-12-04 NOTE — ED Notes (Signed)
C/o generalized weakness, headache, double vision/blury vision in bilateral eyes, and dizziness since yesterday afternoon.  History of stroke in December.

## 2011-12-05 ENCOUNTER — Inpatient Hospital Stay (HOSPITAL_COMMUNITY): Payer: Medicare Other

## 2011-12-05 ENCOUNTER — Encounter (HOSPITAL_COMMUNITY): Payer: Self-pay | Admitting: *Deleted

## 2011-12-05 LAB — CBC
MCH: 30.4 pg (ref 26.0–34.0)
MCHC: 35 g/dL (ref 30.0–36.0)
Platelets: 223 10*3/uL (ref 150–400)
RDW: 13.3 % (ref 11.5–15.5)

## 2011-12-05 LAB — TSH: TSH: 1.207 u[IU]/mL (ref 0.350–4.500)

## 2011-12-05 LAB — COMPREHENSIVE METABOLIC PANEL
ALT: 13 U/L (ref 0–53)
AST: 16 U/L (ref 0–37)
Alkaline Phosphatase: 75 U/L (ref 39–117)
CO2: 26 mEq/L (ref 19–32)
Calcium: 10 mg/dL (ref 8.4–10.5)
Chloride: 101 mEq/L (ref 96–112)
GFR calc Af Amer: 85 mL/min — ABNORMAL LOW (ref >90)
GFR calc non Af Amer: 74 mL/min — ABNORMAL LOW (ref >90)
Glucose, Bld: 98 mg/dL (ref 70–99)
Potassium: 3.9 mEq/L (ref 3.5–5.1)
Sodium: 139 mEq/L (ref 135–145)
Total Bilirubin: 0.4 mg/dL (ref 0.3–1.2)

## 2011-12-05 LAB — LIPID PANEL
LDL Cholesterol: 68 mg/dL (ref 0–99)
Triglycerides: 123 mg/dL (ref <150)
VLDL: 25 mg/dL (ref 0–40)

## 2011-12-05 LAB — HEMOGLOBIN A1C: Hgb A1c MFr Bld: 5.3 % (ref <5.7)

## 2011-12-05 MED ORDER — ENOXAPARIN SODIUM 40 MG/0.4ML ~~LOC~~ SOLN
40.0000 mg | Freq: Every day | SUBCUTANEOUS | Status: DC
  Filled 2011-12-05: qty 0.4

## 2011-12-05 MED ORDER — AMLODIPINE BESYLATE 5 MG PO TABS
5.0000 mg | ORAL_TABLET | Freq: Every day | ORAL | Status: DC
  Administered 2011-12-05: 5 mg via ORAL
  Filled 2011-12-05: qty 1

## 2011-12-05 MED ORDER — SODIUM CHLORIDE 0.9 % IV SOLN
INTRAVENOUS | Status: DC
  Administered 2011-12-05: 01:00:00 via INTRAVENOUS

## 2011-12-05 MED ORDER — GABAPENTIN 100 MG PO CAPS: 100.0000 mg | ORAL_CAPSULE | Freq: Three times a day (TID) | ORAL | Status: DC

## 2011-12-05 MED ORDER — SENNOSIDES-DOCUSATE SODIUM 8.6-50 MG PO TABS: 1.0000 | ORAL_TABLET | Freq: Every evening | ORAL | Status: DC | PRN

## 2011-12-05 MED ORDER — LAMOTRIGINE 200 MG PO TABS
200.0000 mg | ORAL_TABLET | Freq: Every day | ORAL | Status: DC
  Administered 2011-12-05: 200 mg via ORAL
  Filled 2011-12-05 (×2): qty 1

## 2011-12-05 MED ORDER — SIMVASTATIN 20 MG PO TABS
20.0000 mg | ORAL_TABLET | Freq: Every day | ORAL | Status: DC
  Filled 2011-12-05: qty 1

## 2011-12-05 MED ORDER — LAMOTRIGINE 100 MG PO TABS: 100.0000 mg | ORAL_TABLET | ORAL | Status: DC

## 2011-12-05 MED ORDER — ASPIRIN 325 MG PO TABS
325.0000 mg | ORAL_TABLET | Freq: Every day | ORAL | Status: DC
  Administered 2011-12-05: 325 mg via ORAL
  Filled 2011-12-05: qty 1

## 2011-12-05 MED ORDER — ASPIRIN 325 MG PO TABS: 325.0000 mg | ORAL_TABLET | Freq: Every day | ORAL | Status: AC

## 2011-12-05 MED ORDER — LAMOTRIGINE 100 MG PO TABS: 100.0000 mg | ORAL_TABLET | Freq: Two times a day (BID) | ORAL | Status: DC

## 2011-12-05 MED ORDER — ACETAMINOPHEN 325 MG PO TABS
650.0000 mg | ORAL_TABLET | Freq: Four times a day (QID) | ORAL | Status: DC | PRN
  Administered 2011-12-05 (×2): 650 mg via ORAL
  Filled 2011-12-05 (×2): qty 2

## 2011-12-05 MED ORDER — ASPIRIN 300 MG RE SUPP
300.0000 mg | Freq: Every day | RECTAL | Status: DC
  Filled 2011-12-05: qty 1

## 2011-12-05 MED ORDER — LAMOTRIGINE 100 MG PO TABS
100.0000 mg | ORAL_TABLET | Freq: Every day | ORAL | Status: DC
  Administered 2011-12-05: 100 mg via ORAL
  Filled 2011-12-05: qty 1

## 2011-12-05 MED ORDER — GABAPENTIN 100 MG PO CAPS
100.0000 mg | ORAL_CAPSULE | Freq: Three times a day (TID) | ORAL | Status: DC
  Administered 2011-12-05 (×2): 100 mg via ORAL
  Filled 2011-12-05 (×4): qty 1

## 2011-12-05 NOTE — Progress Notes (Signed)
Stroke Team Progress Note  HISTORY Connor [name] is an 66 y.o. male s/p infarct in December of 2012 who reports that on 12/03/2011 had the acute onset of vertigo, particularly with head movement, and increased blurring of vision. The patient reports that he has some blurred vision as a residual from his last infarct but his current symptoms are worse. He also has some balance deficits and right sensory loss as a consequence of his last infarct. Patient's blood pressur was increased at presentation. The patient reports that his pressure has not been completely controlled since his stroke.  SUBJECTIVE No family is at the bedside. Overall he feels his condition is gradually improving, better than yesterday.  OBJECTIVE Filed Vitals:   12/05/11 0300 12/05/11 0500 12/05/11 0650 12/05/11 0841  BP: 112/67 114/64 114/74 156/89  Pulse: 70 75 81 83  Temp: 98.2 F (36.8 C) 97.9 F (36.6 C) 98 F (36.7 C) 97.6 F (36.4 C)  TempSrc:    Oral  Resp: 18 18 18 18   Height:  6' (1.829 m)    Weight:  88.451 kg (195 lb)    SpO2: 97% 96% 94% 94%    CBG (last 3) No results found for this basename: GLUCAP:3 in the last 72 hours Intake/Output from previous day: 02/10 0701 - 02/11 0700 In: 358.8 [I.V.:358.8] Out: -   IV Fluid Intake:     . sodium chloride 75 mL/hr at 12/05/11 0600   Medications    . acetaminophen  1,000 mg Oral Once  . amLODipine  5 mg Oral Daily  . aspirin  300 mg Rectal Daily   Or  . aspirin  325 mg Oral Daily  . gabapentin  100 mg Oral TID  . lamoTRIgine  100 mg Oral Daily  . lamoTRIgine  200 mg Oral QHS  . meclizine  50 mg Oral Once  . simvastatin  20 mg Oral q1800  . DISCONTD: lamoTRIgine  100-200 mg Oral See admin instructions  . DISCONTD: lamoTRIgine  100-200 mg Oral BID  PRN acetaminophen, senna-docusate  Diet:  Cardiac thin liquids Activity:    DVT Prophylaxis:  SCDs   Significant Diagnostic Studies: CBC    Component Value Date/Time   WBC 4.6 12/05/2011 0730     RBC 4.51 12/05/2011 0730   HGB 13.7 12/05/2011 0730   HCT 39.1 12/05/2011 0730   PLT 223 12/05/2011 0730   MCV 86.7 12/05/2011 0730   MCH 30.4 12/05/2011 0730   MCHC 35.0 12/05/2011 0730   RDW 13.3 12/05/2011 0730   LYMPHSABS 1.3 10/06/2011 0600   MONOABS 1.3* 10/06/2011 0600   EOSABS 0.2 10/06/2011 0600   BASOSABS 0.1 10/06/2011 0600   CMP    Component Value Date/Time   NA 139 12/05/2011 0730   K 3.9 12/05/2011 0730   CL 101 12/05/2011 0730   CO2 26 12/05/2011 0730   GLUCOSE 98 12/05/2011 0730   BUN 11 12/05/2011 0730   CREATININE 1.05 12/05/2011 0730   CALCIUM 10.0 12/05/2011 0730   PROT 7.1 12/05/2011 0730   ALBUMIN 3.8 12/05/2011 0730   AST 16 12/05/2011 0730   ALT 13 12/05/2011 0730   ALKPHOS 75 12/05/2011 0730   BILITOT 0.4 12/05/2011 0730   GFRNONAA 74* 12/05/2011 0730   GFRAA 85* 12/05/2011 0730   COAGS Lab Results  Component Value Date   INR 1.04 08/25/2010   Lipid Panel    Component Value Date/Time   CHOL 149 12/05/2011 0730   TRIG 123 12/05/2011 0730  HDL 56 12/05/2011 0730   CHOLHDL 2.7 12/05/2011 0730   VLDL 25 12/05/2011 0730   LDLCALC 68 12/05/2011 0730   HgbA1C  Lab Results  Component Value Date   HGBA1C 5.5 10/01/2011   Urine Drug Screen  No results found for this basename: labopia, cocainscrnur, labbenz, amphetmu, thcu, labbarb    Alcohol Level No results found for this basename: eth   Cardiac Enzymes neg Urine Cx 4,000 colonies, insignificant   CT of the brain   No acute intracranial findings or mass lesions.   MRI of the brain (12/05/11)  1. No acute intracranial abnormality. 2.  Subtle CHRONIC left medullary and right inferior cerebellar encephalomalacia corresponding to the December infarcts.  MRA of the brain (12/05/11)  Mildly degraded by motion artifact.  Negative intracranial MRA.   2D Echocardiogram  10/03/2011 EF 55-60% with no source of embolus.   Carotid Doppler  10/03/2011 No internal carotid artery stenosis bilaterally. Vertebrals with antegrade  flow bilaterally.   CXR   No evidence of active pulmonary disease.    TEE 10/04/2011  EF 55-60%. No PFO. Negative bubble. No vegetation   EKG  normal sinus rhythm.   Physical Exam   GENERAL EXAM: Patient is in no distress  CARDIOVASCULAR: Regular rate and rhythm, no murmurs, no carotid bruits  NEUROLOGIC: MENTAL STATUS: awake, alert, language fluent, comprehension intact, naming intact CRANIAL NERVE: pupils equal and reactive to light, visual fields full to confrontation, extraocular muscles intact, no nystagmus, facial sensation and strength symmetric, MILD LEFT PTOSIS. uvula midline, shoulder shrug symmetric, tongue midline. MOTOR: normal bulk and tone, full strength in the BUE, BLE SENSORY: normal and symmetric to light touch, pinprick, temperature, vibration and proprioception COORDINATION: MILD DYSMETRIA RUE.  REFLEXES: deep tendon reflexes present and symmetric  ASSESSMENT [name] is a 66 y.o. male with sudden onset blurred vision and dizziness that lasted about 1 day, now resolved. Pt reports recent head congestion, beer intake within 1 hour, ? Dehydrated. On aspirin 325 mg orally every day for secondary stroke prevention. MRI on this admission negative for acute stroke.  Dx: TIA vs. presyncope vs. 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  -no need to repeat TIA workup; had extensive workup in Dec 2012 -discussed with Dr. Kevan Ny via phone  Connor [name], AVNP, ANP-BC,  GNP-BC [facility] Pager: [phone] 12/05/2011 9:52 AM  Dr. Joycelyn Schmid has personally reviewed chart, pertinent data, examined the patient and developed the plan of care.

## 2011-12-05 NOTE — Evaluation (Signed)
Occupational Therapy Evaluation Patient Details Name: Connor [name] MRN: [id number] DOB: 1945/11/29 Today's Date: 12/05/2011  Problem List:  Patient Active Problem List  Diagnoses  . Stroke, acute, embolic  . HTN (hypertension)  . Seizure disorder  . Hyperlipidemia LDL goal < 100  . Horner's syndrome  . CVA (cerebral infarction)  . Dizziness    Past Medical History:  Past Medical History  Diagnosis Date  . Head and neck cancer ~ 2009    S/P radiation & chem  . Stroke   . Hypertension   . Seizures     "the bad kind;bite tongue;see Dr.Love; last 07/2011 & 08/2011"  . Chronic kidney disease   . Cancer     h/o neck  . Kidney carcinoma     h/o  . Arthritis   . Hyperlipidemia    Past Surgical History:  Past Surgical History  Procedure Date  . Nephrectomy 1990's    left  . Joint replacement   . Total knee arthroplasty 2011    left  . Hydrocelectomy 11/2000    left  . Tee without cardioversion 10/04/2011    Procedure: TRANSESOPHAGEAL ECHOCARDIOGRAM (TEE);  Surgeon: Donato Schultz, MD;  Location: [facility] ENDOSCOPY;  Service: Cardiovascular;  Laterality: N/A;    OT Assessment/Plan/Recommendation OT Assessment Clinical Impression Statement: Pt is a 66 year old man with recent hx of L medullary and cerebellar CVAs who returns with c/o dizziness and vision changes.  Pt was ambulating with a cane PTA having completed inpatient rehab and [facility] therapy, but was independent in self care and had resumed driving.  Pt continues to have balance issues, but reports having returned to his baseline.  No OT needs identified.  Equipment needs are met. OT Recommendation Follow Up Recommendations: No OT follow up;Supervision - Intermittent Equipment Recommended: None recommended by OT  OT Evaluation Precautions/Restrictions  Precautions Precautions: Fall Prior Functioning Home Living Lives With: Other (Comment) (step son) Type of Home: House Home Layout: Two level;Other (Comment) (workshop  in basement) Alternate Level Stairs-Number of Steps: 13 Home Access: Stairs to enter Entrance Stairs-Rails: None Entrance Stairs-Number of Steps: 2 Bathroom Shower/Tub: Walk-in shower;Door [employer] Toilet: Standard Home Adaptive Equipment: Walker - rolling;Built-in shower seat;Straight cane Prior Function Level of Independence: Independent with basic ADLs;Independent with homemaking with ambulation;Requires assistive device for independence;Other (comment) (cane for ambulation) Able to Take Stairs?: Yes Driving: Yes Vocation: Retired ADL ADL Eating/Feeding: Performed;Independent Where Assessed - Eating/Feeding: Bed level Grooming: Performed;Wash/dry hands;Supervision/safety Where Assessed - Grooming: Standing at sink Upper Body Bathing: Simulated;Set up Where Assessed - Upper Body Bathing: Standing at sink Lower Body Bathing: Simulated;Set up Where Assessed - Lower Body Bathing: Sitting, bed;Sit to stand from bed Upper Body Dressing: Simulated;Set up Where Assessed - Upper Body Dressing: Sitting, chair Lower Body Dressing: Performed;Set up Where Assessed - Lower Body Dressing: Sitting, bed;Sit to stand from bed Toilet Transfer: Supervision/safety Toilet Transfer Method: Ambulating Toilet Transfer Equipment: Regular height toilet Toileting - Clothing Manipulation: Performed;Supervision/safety Where Assessed - Glass blower/designer Manipulation: Standing Toileting - Hygiene: Performed;Independent Where Assessed - Toileting Hygiene: Sit to stand from 3-in-1 or toilet Equipment Used: Cane Ambulation Related to ADLs: supervision with cane to ambulate, uses increased speed to compensate for decreased balance ADL Comments: Pt is at his baseline (since Dec 2012) in ADL. Vision/Perception  Vision - History Baseline Vision: Wears glasses only for reading Patient Visual Report: Diplopia;Eye fatigue/eye pain/headache (diplopia with eye fatigue) Vision - Assessment Eye Alignment: Within  Functional Limits Cognition Cognition Arousal/Alertness: Awake/alert Overall Cognitive  Status: Appears within functional limits for tasks assessed Orientation Level: Oriented X4 Sensation/Coordination Sensation Hot/Cold: Impaired by gross assessment (R UE) Proprioception: Appears Intact Coordination Gross Motor Movements are Fluid and Coordinated: No Fine Motor Movements are Fluid and Coordinated: Yes Extremity Assessment RUE Assessment RUE Assessment: Within Functional Limits (pt reports return of full use, fine motor decreases with fatigue) LUE Assessment LUE Assessment: Within Functional Limits Mobility  Bed Mobility Bed Mobility: Yes Supine to Sit: 7: Independent Transfers Transfers: Yes Sit to Stand: 5: Supervision Stand to Sit: 5: Supervision End of Session OT - End of Session Equipment Utilized During Treatment: Gait belt Activity Tolerance: Patient tolerated treatment well Patient left: in chair;with call bell in reach General Behavior During Session: [facility] for tasks performed Cognition: [facility] for tasks performed   Evern Bio 12/05/2011, 9:50 AM

## 2011-12-05 NOTE — ED Notes (Signed)
Patient currently sitting up in bed; no respiratory or acute distress noted.  Patient updated on plan of care; informed patient that a bed is ready and report is being called.  Patient has no other questions or concerns at this time; will continue to monitor.

## 2011-12-05 NOTE — ED Notes (Signed)
Report given to Victorino Dike, Charity fundraiser.  Informed Victorino Dike that she can call back with any questions/concerns once patient comes up to floor.  Preparing patient for transport.

## 2011-12-05 NOTE — Progress Notes (Signed)
Speech Language/Pathology  SLP screen complete. Cognitive-linguistic function WFL. No skilled SLP f/u indicated at this time. Defer formal evaluation.  Ferdinand Lango MA, CCC-SLP [phone]

## 2011-12-05 NOTE — ED Notes (Signed)
Calling report now. 

## 2011-12-05 NOTE — Discharge Summary (Signed)
Physician Discharge Summary  NAME:Connor [name]  XBJ:[id number]  DOB: Oct 06, 1946   Admit date: 12/04/2011 Discharge date: 12/05/2011  Discharge Diagnoses:  Principal Problem:  *Dizziness - improved. No evidence for CVA on MRI. Possibly vestibulopathy Active Problems:  HTN (hypertension) - controlled  Seizure disorder - stable  Hyperlipidemia LDL goal < 100  Horner's syndrome  CVA (cerebral infarction) - history of, stable  Filed Vitals:   12/05/11 0650 12/05/11 0841 12/05/11 1031 12/05/11 1248  BP: 114/74 156/89 146/83 148/82  Pulse: 81 83 73 70  Temp: 98 F (36.7 C) 97.6 F (36.4 C) 97.6 F (36.4 C) 97.8 F (36.6 C)  TempSrc:  Oral Oral Oral  Resp: 18 18 18 18   Height:      Weight:      SpO2: 94% 94% 96% 96%    CARDIOVASCULAR:  Regular rate and rhythm, no murmurs, no carotid bruits  NEUROLOGIC:  MENTAL STATUS: awake, alert, language fluent, comprehension intact, naming intact  CRANIAL NERVE: pupils equal and reactive to light, visual fields full to confrontation, extraocular muscles intact, no nystagmus, facial sensation and strength symmetric, MILD LEFT PTOSIS. uvula midline, shoulder shrug symmetric, tongue midline.  MOTOR: normal bulk and tone, full strength in the BUE, BLE  SENSORY: normal and symmetric to light touch, pinprick, temperature, vibration and proprioception  COORDINATION: MILD DYSMETRIA RUE.  REFLEXES: deep tendon reflexes present and symmetric   Discharge Condition:  improved   Hospital Course:  66 year old male with CVA in December 2012. Had onset 2 days ago of vertigo and blurring of vision, worse than usual since last infarct.  He has a history of right sensory loss from last infarction. Blood pressure is running slightly high. Admitted for further workup. MRI reveals no stroke. Symptomatically improved. Seen by neurology. Okay for discharge home on one aspirin daily and I will follow him back up within the week in the office  Consults:  Neurology  - Dr. Marjory Lies  Disposition:  Home with followup at Dr. Kevan Ny office in one week  Discharge Orders    Future Appointments: Provider: Department: Dept Phone: Center:   02/15/2012 1:40 PM Ranelle Oyster, MD [facility] [phone] CPR     Future Orders Please Complete By Expires   Diet - low sodium heart healthy      Increase activity slowly      Discharge instructions      Comments:   Call M.D. At [phone] if having visual changes, increased difficulty ambulating over normal, or focal weakness   Call MD for:  temperature >100.4      Call MD for:  persistant nausea and vomiting      Call MD for:  persistant dizziness or light-headedness        Medication List  As of 12/05/2011  1:51 PM   TAKE these medications         amLODipine 5 MG tablet   Commonly known as: NORVASC   Take 1 tablet (5 mg total) by mouth daily.      aspirin 325 MG tablet   Take 1 tablet (325 mg total) by mouth daily.      gabapentin 100 MG capsule   Commonly known as: NEURONTIN   Take 100 mg by mouth 3 (three) times daily as needed. For pain      gabapentin 100 MG capsule   Commonly known as: NEURONTIN   Take 1 capsule (100 mg total) by mouth 3 (three) times daily.  lamoTRIgine 100 MG tablet   Commonly known as: LAMICTAL   Take 100-200 mg by mouth See admin instructions. Take one pill in morning and two pills at bedtime      lamoTRIgine 100 MG tablet   Commonly known as: LAMICTAL   Take 1-2 tablets (100-200 mg total) by mouth 2 (two) times daily. Take one pill in morning and two pills at bedtime      simvastatin 20 MG tablet   Commonly known as: ZOCOR   Take 1 tablet (20 mg total) by mouth daily at 6 PM.           Follow-up Information    Follow up with Inis Borneman NEVILL, MD .         Things to follow up in the outpatient setting: patient will notify M.D. If symptoms of increased blurred vision, gait instability, or dizziness recur  Time coordinating discharge: 20  minutes  The results of significant diagnostics from this hospitalization (including imaging, microbiology, ancillary and laboratory) are listed below for reference.    Significant Diagnostic Studies: Dg Chest 2 View  12/04/2011  *RADIOLOGY REPORT*  Clinical Data: Dizziness and lightheaded.  CHEST - 2 VIEW  Comparison: 10/01/2011  Findings: Normal heart size and pulmonary vascularity.  Lungs appear clear expanded.  No focal airspace consolidation.  No blunting of costophrenic angles.  No pneumothorax.  Surgical clips in the upper abdomen.  Degenerative changes in the spine.  Stable appearance since previous study.  IMPRESSION: No evidence of active pulmonary disease.  Original Report Authenticated By: Marlon Pel, M.D.   Ct Head Wo Contrast  12/04/2011  *RADIOLOGY REPORT*  Clinical Data: Weakness and dizziness.  CT HEAD WITHOUT CONTRAST  Technique:  Contiguous axial images were obtained from the base of the skull through the vertex without contrast.  Comparison: Head CT 10/01/2011.  Findings: The ventricles are normal.  No extra-axial fluid collections are seen.  The brainstem and cerebellum are unremarkable.  No acute intracranial findings such as infarction or hemorrhage.  The bony structures are intact.  Minimal scattered sinus disease. The mastoid air cells are clear.  The globes are intact.  IMPRESSION: No acute intracranial findings or mass lesions.  Original Report Authenticated By: P. Loralie Champagne, M.D.   Mr Brain Wo Contrast  12/05/2011  *RADIOLOGY REPORT*  Clinical Data: 66 year old male status post posterior fossa stroke in December.  New onset blurred vision and subsequent dizziness.  Comparison: Head CT 12/04/2011.  Brain MRI 10/03/2011.  MRI HEAD WITHOUT CONTRAST  Technique: Multiplanar, multiecho pulse sequences of the brain and surrounding structures were obtained according to standard protocol without intravenous contrast.  Findings: No restricted diffusion to suggest acute  infarction. Subtle encephalomalacia at the left lateral medulla and inferior right cerebellum corresponding to the ischemia in December.  No midline shift, ventriculomegaly, mass effect, evidence of mass lesion, extra-axial collection or acute intracranial hemorrhage. Cervicomedullary junction and pituitary are within normal limits. Major intracranial vascular flow voids are preserved.  Supratentorial gray and white matter signal is within normal limits for age. Visualized orbit soft tissues are within normal limits. Negative optic chiasm.  Negative visualized cervical spine.  Normal bone marrow signal. Visualized internal auditory structures are grossly normal.  Mild paranasal sinus and right mastoid air cell inflammatory changes.  Negative visualized face soft tissues.  IMPRESSION: 1. No acute intracranial abnormality. 2.  Subtle left medullary and right inferior cerebellar encephalomalacia corresponding to the December infarcts. 3.  MRA findings are below.  MRA HEAD  WITHOUT CONTRAST  Technique: Angiographic images of the Circle of Willis were obtained using MRA technique without  intravenous contrast.  Findings: Antegrade flow in the posterior circulation.  Codominant distal vertebral arteries.  Normal right PICA.  Dominant left AICA. Normal basilar artery.  Superior cerebellar artery and right PCA origins are within normal limits.  Fetal type left PCA origin. Right posterior communicating artery is within normal limits. Bilateral PCA branches are within normal limits.  Antegrade flow in both ICA siphons.  Mild ICA irregularity.  No ICA siphon stenosis.  Normal carotid termini, MCA, and ACA origins. Motion artifact affecting the visualized ACA branches.  Grossly normal anterior communicating artery and visualized ACA branches. MCA branches also affected by motion.  Bilateral visualized MCA branches are grossly normal.  IMPRESSION: Mildly degraded by motion artifact.  Negative intracranial MRA.  Original Report  Authenticated By: Harley Hallmark, M.D.   Mr Maxine Glenn Head/brain Wo Cm  12/05/2011  *RADIOLOGY REPORT*  Clinical Data: 66 year old male status post posterior fossa stroke in December.  New onset blurred vision and subsequent dizziness.  Comparison: Head CT 12/04/2011.  Brain MRI 10/03/2011.  MRI HEAD WITHOUT CONTRAST  Technique: Multiplanar, multiecho pulse sequences of the brain and surrounding structures were obtained according to standard protocol without intravenous contrast.  Findings: No restricted diffusion to suggest acute infarction. Subtle encephalomalacia at the left lateral medulla and inferior right cerebellum corresponding to the ischemia in December.  No midline shift, ventriculomegaly, mass effect, evidence of mass lesion, extra-axial collection or acute intracranial hemorrhage. Cervicomedullary junction and pituitary are within normal limits. Major intracranial vascular flow voids are preserved.  Supratentorial gray and white matter signal is within normal limits for age. Visualized orbit soft tissues are within normal limits. Negative optic chiasm.  Negative visualized cervical spine.  Normal bone marrow signal. Visualized internal auditory structures are grossly normal.  Mild paranasal sinus and right mastoid air cell inflammatory changes.  Negative visualized face soft tissues.  IMPRESSION: 1. No acute intracranial abnormality. 2.  Subtle left medullary and right inferior cerebellar encephalomalacia corresponding to the December infarcts. 3.  MRA findings are below.  MRA HEAD WITHOUT CONTRAST  Technique: Angiographic images of the Circle of Willis were obtained using MRA technique without  intravenous contrast.  Findings: Antegrade flow in the posterior circulation.  Codominant distal vertebral arteries.  Normal right PICA.  Dominant left AICA. Normal basilar artery.  Superior cerebellar artery and right PCA origins are within normal limits.  Fetal type left PCA origin. Right posterior communicating  artery is within normal limits. Bilateral PCA branches are within normal limits.  Antegrade flow in both ICA siphons.  Mild ICA irregularity.  No ICA siphon stenosis.  Normal carotid termini, MCA, and ACA origins. Motion artifact affecting the visualized ACA branches.  Grossly normal anterior communicating artery and visualized ACA branches. MCA branches also affected by motion.  Bilateral visualized MCA branches are grossly normal.  IMPRESSION: Mildly degraded by motion artifact.  Negative intracranial MRA.  Original Report Authenticated By: Harley Hallmark, M.D.    Microbiology: No results found for this or any previous visit (from the past 240 hour(s)).   Labs: Results for orders placed during the hospital encounter of 12/04/11  URINALYSIS, ROUTINE W REFLEX MICROSCOPIC      Component Value Range   Color, Urine YELLOW  YELLOW    APPearance CLEAR  CLEAR    Specific Gravity, Urine 1.013  1.005 - 1.030    pH 7.0  5.0 - 8.0  Glucose, UA NEGATIVE  NEGATIVE (mg/dL)   Hgb urine dipstick NEGATIVE  NEGATIVE    Bilirubin Urine NEGATIVE  NEGATIVE    Ketones, ur NEGATIVE  NEGATIVE (mg/dL)   Protein, ur NEGATIVE  NEGATIVE (mg/dL)   Urobilinogen, UA 0.2  0.0 - 1.0 (mg/dL)   Nitrite NEGATIVE  NEGATIVE    Leukocytes, UA NEGATIVE  NEGATIVE   POCT I-STAT, CHEM 8      Component Value Range   Sodium 138  135 - 145 (mEq/L)   Potassium 4.0  3.5 - 5.1 (mEq/L)   Chloride 101  96 - 112 (mEq/L)   BUN 11  6 - 23 (mg/dL)   Creatinine, Ser 1.30  0.50 - 1.35 (mg/dL)   Glucose, Bld 865 (*) 70 - 99 (mg/dL)   Calcium, Ion 7.84  6.96 - 1.32 (mmol/L)   TCO2 27  0 - 100 (mmol/L)   Hemoglobin 15.0  13.0 - 17.0 (g/dL)   HCT 29.5  28.4 - 13.2 (%)  POCT I-STAT TROPONIN I      Component Value Range   Troponin i, poc 0.00  0.00 - 0.08 (ng/mL)   Comment 3           COMPREHENSIVE METABOLIC PANEL      Component Value Range   Sodium 139  135 - 145 (mEq/L)   Potassium 3.9  3.5 - 5.1 (mEq/L)   Chloride 101  96 - 112  (mEq/L)   CO2 26  19 - 32 (mEq/L)   Glucose, Bld 98  70 - 99 (mg/dL)   BUN 11  6 - 23 (mg/dL)   Creatinine, Ser 4.40  0.50 - 1.35 (mg/dL)   Calcium 10.2  8.4 - 10.5 (mg/dL)   Total Protein 7.1  6.0 - 8.3 (g/dL)   Albumin 3.8  3.5 - 5.2 (g/dL)   AST 16  0 - 37 (U/L)   ALT 13  0 - 53 (U/L)   Alkaline Phosphatase 75  39 - 117 (U/L)   Total Bilirubin 0.4  0.3 - 1.2 (mg/dL)   GFR calc non Af Amer 74 (*) >90 (mL/min)   GFR calc Af Amer 85 (*) >90 (mL/min)  CBC      Component Value Range   WBC 4.6  4.0 - 10.5 (K/uL)   RBC 4.51  4.22 - 5.81 (MIL/uL)   Hemoglobin 13.7  13.0 - 17.0 (g/dL)   HCT 72.5  36.6 - 44.0 (%)   MCV 86.7  78.0 - 100.0 (fL)   MCH 30.4  26.0 - 34.0 (pg)   MCHC 35.0  30.0 - 36.0 (g/dL)   RDW 34.7  42.5 - 95.6 (%)   Platelets 223  150 - 400 (K/uL)  TSH      Component Value Range   TSH 1.207  0.350 - 4.500 (uIU/mL)  LIPID PANEL      Component Value Range   Cholesterol 149  0 - 200 (mg/dL)   Triglycerides 387  <564 (mg/dL)   HDL 56  >33 (mg/dL)   Total CHOL/HDL Ratio 2.7     VLDL 25  0 - 40 (mg/dL)   LDL Cholesterol 68  0 - 99 (mg/dL)     Signed: Reisha Wos NEVILL 12/05/2011, 1:51 PM

## 2011-12-05 NOTE — Progress Notes (Signed)
12-05-11 UR completed. Haylen Shelnutt RN BSN  

## 2011-12-05 NOTE — Evaluation (Signed)
Physical Therapy Evaluation Patient Details Name: Connor [name] MRN: [id number] DOB: 01-04-1946 Today's Date: 12/05/2011  Problem List:  Patient Active Problem List  Diagnoses  . Stroke, acute, embolic  . HTN (hypertension)  . Seizure disorder  . Hyperlipidemia LDL goal < 100  . Horner's syndrome  . CVA (cerebral infarction)  . Dizziness    Past Medical History:  Past Medical History  Diagnosis Date  . Head and neck cancer ~ 2009    S/P radiation & chem  . Stroke   . Hypertension   . Seizures     "the bad kind;bite tongue;see Dr.Love; last 07/2011 & 08/2011"  . Chronic kidney disease   . Cancer     h/o neck  . Kidney carcinoma     h/o  . Arthritis   . Hyperlipidemia    Past Surgical History:  Past Surgical History  Procedure Date  . Nephrectomy 1990's    left  . Joint replacement   . Total knee arthroplasty 2011    left  . Hydrocelectomy 11/2000    left  . Tee without cardioversion 10/04/2011    Procedure: TRANSESOPHAGEAL ECHOCARDIOGRAM (TEE);  Surgeon: Donato Schultz, MD;  Location: [facility];  Service: Cardiovascular;  Laterality: N/A;    PT Assessment/Plan/Recommendation PT Assessment Clinical Impression Statement: Pt is 66 y/o male with recent admission of watershed CVA and right side cerebellar stroke affecting balance as well as left sided numbness.  Pt readmitted due to new onset of worsening left sided numbness and right sided facial numbness.  Pt currently awaiting MRI results for possible d/c home today.  Pt scored 43/56 on BERG giving pt significant fall risk.  Therefore pt will benefit from acute PT services to improve overall balance and mobility to prepare for safe d/c home with step son.  PT Recommendation/Assessment: Patient will need skilled PT in the acute care venue PT Problem List: Decreased activity tolerance;Decreased balance;Decreased mobility;Decreased knowledge of use of DME;Decreased safety awareness PT Therapy Diagnosis : Difficulty  walking;Abnormality of gait;Generalized weakness PT Plan PT Frequency: Min 4X/week PT Treatment/Interventions: DME instruction;Gait training;Stair training;Functional mobility training;Therapeutic activities;Therapeutic exercise;Balance training;Neuromuscular re-education;Patient/family education PT Recommendation Follow Up Recommendations: Outpatient PT Equipment Recommended: None recommended by OT;None recommended by PT PT Goals  Acute Rehab PT Goals PT Goal Formulation: With patient Time For Goal Achievement: 7 days Pt will go Supine/Side to Sit: Independently PT Goal: Supine/Side to Sit - Progress: Goal set today Pt will go Sit to Supine/Side: Independently PT Goal: Sit to Supine/Side - Progress: Goal set today Pt will go Sit to Stand: with modified independence PT Goal: Sit to Stand - Progress: Goal set today Pt will go Stand to Sit: with modified independence PT Goal: Stand to Sit - Progress: Goal set today Pt will Ambulate: >150 feet;with modified independence;with cane PT Goal: Ambulate - Progress: Goal set today Pt will Go Up / Down Stairs: 3-5 stairs;with modified independence;with least restrictive assistive device PT Goal: Up/Down Stairs - Progress: Goal set today Additional Goals Additional Goal #1: Pt will show improvement with BERG scoring 50/56 to reduce overall fall risk. PT Goal: Additional Goal #1 - Progress: Goal set today  PT Evaluation Precautions/Restrictions  Precautions Precautions: Fall Prior Functioning  Home Living Lives With: Other (Comment) (step son) Type of Home: House Home Layout: Two level;Other (Comment) (workshop in basement) Alternate Level Stairs-Number of Steps: 13 Home Access: Stairs to enter Entrance Stairs-Rails: None Entrance Stairs-Number of Steps: 2 Bathroom Shower/Tub: Walk-in shower;Door [employer] Toilet: Standard Home Adaptive  Equipment: Dan Humphreys - rolling;Built-in shower seat;Straight cane Prior Function Level of Independence:  Independent with basic ADLs;Independent with homemaking with ambulation;Requires assistive device for independence;Other (comment) (cane for ambulation) Able to Take Stairs?: Yes Driving: Yes Vocation: Retired Financial risk analyst Arousal/Alertness: Awake/alert Overall Cognitive Status: Appears within functional limits for tasks assessed Orientation Level: Oriented X4 Sensation/Coordination Sensation Light Touch: Impaired Detail Light Touch Impaired Details: Impaired LUE;Impaired LLE Hot/Cold: Impaired by gross assessment (R UE) Proprioception: Appears Intact Coordination Gross Motor Movements are Fluid and Coordinated: No Fine Motor Movements are Fluid and Coordinated: Yes Extremity Assessment RUE Assessment RUE Assessment: Within Functional Limits (pt reports return of full use, fine motor decreases with fat) LUE Assessment LUE Assessment: Within Functional Limits RLE Assessment RLE Assessment: Within Functional Limits LLE Assessment LLE Assessment: Within Functional Limits Mobility (including Balance) Bed Mobility Bed Mobility: Yes Supine to Sit: 7: Independent Transfers Transfers: Yes Sit to Stand: 5: Supervision Sit to Stand Details (indicate cue type and reason): Supervision for safety due to pt quick to move Stand to Sit: 5: Supervision Ambulation/Gait Ambulation/Gait: Yes Ambulation/Gait Assistance: 5: Supervision Ambulation/Gait Assistance Details (indicate cue type and reason): Supervision for safety.  Pt ambulates with wide BOS and occasional lean to left side. Ambulation Distance (Feet): 200 Feet Assistive device: Straight cane Gait Pattern: Decreased step length - right;Decreased step length - left;Shuffle Stairs: No Corporate treasurer: No  Posture/Postural Control Posture/Postural Control: No significant limitations Balance Balance Assessed: Yes Static Sitting Balance Static Sitting - Balance Support: Feet supported Static Sitting -  Level of Assistance: 7: Independent Berg Balance Test Sit to Stand: Able to stand without using hands and stabilize independently Standing Unsupported: Able to stand safely 2 minutes Sitting with Back Unsupported but Feet Supported on Floor or Stool: Able to sit safely and securely 2 minutes Stand to Sit: Sits safely with minimal use of hands Transfers: Able to transfer safely, minor use of hands Standing Unsupported with Eyes Closed: Able to stand 10 seconds with supervision Standing Ubsupported with Feet Together: Able to place feet together independently and stand for 1 minute with supervision From Standing, Reach Forward with Outstretched Arm: Can reach confidently >25 cm (10") From Standing Position, Pick up Object from Floor: Able to pick up shoe safely and easily From Standing Position, Turn to Look Behind Over each Shoulder: Looks behind one side only/other side shows less weight shift Turn 360 Degrees: Able to turn 360 degrees safely but slowly Standing Unsupported, Alternately Place Feet on Step/Stool: Needs assistance to keep from falling or unable to try Standing Unsupported, One Foot in Front: Able to take small step independently and hold 30 seconds Standing on One Leg: Able to lift leg independently and hold equal to or more than 3 seconds Total Score: 43  Exercise    End of Session General Behavior During Session: [facility] for tasks performed Cognition: Impaired Cognitive Impairment: Pt tends to have mild safety awareness/judgement impairments.  Emigdio Wildeman 12/05/2011, 10:29 AM [phone]

## 2011-12-06 LAB — URINE CULTURE

## 2012-02-15 ENCOUNTER — Ambulatory Visit: Payer: Medicare Other | Admitting: Physical Medicine & Rehabilitation

## 2012-03-02 ENCOUNTER — Other Ambulatory Visit: Payer: Self-pay

## 2012-03-02 MED ORDER — LAMOTRIGINE 100 MG PO TABS: 100.0000 mg | ORAL_TABLET | Freq: Two times a day (BID) | ORAL | Status: DC

## 2012-03-09 ENCOUNTER — Encounter: Payer: Medicare Other | Attending: Physical Medicine & Rehabilitation | Admitting: Physical Medicine & Rehabilitation

## 2012-03-09 ENCOUNTER — Encounter: Payer: Self-pay | Admitting: Physical Medicine & Rehabilitation

## 2012-03-09 VITALS — BP 165/97 | HR 77 | Ht 72.0 in | Wt 208.0 lb

## 2012-03-09 DIAGNOSIS — G40909 Epilepsy, unspecified, not intractable, without status epilepticus: Secondary | ICD-10-CM

## 2012-03-09 DIAGNOSIS — H5500 Unspecified nystagmus: Secondary | ICD-10-CM | POA: Insufficient documentation

## 2012-03-09 DIAGNOSIS — I69998 Other sequelae following unspecified cerebrovascular disease: Secondary | ICD-10-CM | POA: Insufficient documentation

## 2012-03-09 DIAGNOSIS — I10 Essential (primary) hypertension: Secondary | ICD-10-CM | POA: Insufficient documentation

## 2012-03-09 DIAGNOSIS — I6350 Cerebral infarction due to unspecified occlusion or stenosis of unspecified cerebral artery: Secondary | ICD-10-CM

## 2012-03-09 DIAGNOSIS — I639 Cerebral infarction, unspecified: Secondary | ICD-10-CM

## 2012-03-09 DIAGNOSIS — R42 Dizziness and giddiness: Secondary | ICD-10-CM

## 2012-03-09 NOTE — Progress Notes (Signed)
Subjective:    Patient ID: Connor [name], male    DOB: 22-Jan-1946, 66 y.o.   MRN: [id number]  HPI  Connor [name] is back regarding his medullary stroke. He is doing well for the most part. His biggest problem is his vision. He reports that when he looks at the TV or reads a book he develops pain over the left fronto-temporal area. He tells me it comes on almost immediately. If he stops trying to focus, the pain goes away.  His reading glasses don't seem to help him focus as well either. He has had decreased diplopia as a whole, though.  He's not doing his vestibular exercises anymore.  He finds that if he glances around and doesn't stay focused on the object too long his symptoms aren't as bad.  As far as balance is concerned, he's moving away from the cane. He uses it for longer dx or for uneven terrain still as he feels unsteady.  He's returned to driving and hasn't had a problem. His mood has been good. He reports no other problems today.    Pain Inventory Average Pain 1 Pain Right Now 1 My pain is dull  In the last 24 hours, has pain interfered with the following? General activity 1 Relation with others 1 Enjoyment of life 1 What TIME of day is your pain at its worst? evening Sleep (in general) Fair  Pain is worse with: unsure Pain improves with: rest and medication Relief from Meds: 2  Mobility use a cane how many minutes can you walk? 15 min ability to climb steps?  yes do you drive?  yes Do you have any goals in this area?  yes  Function retired  Neuro/Psych numbness trouble walking dizziness  Prior Studies Any changes since last visit?  yes  Physicians involved in your care Dr Lanney Gins, Dr Butch Penny       Review of Systems  Neurological: Positive for dizziness and numbness.  All other systems reviewed and are negative.       Objective:   Physical Exam  Constitutional: He is oriented to person, place, and time. He appears well-developed and  well-nourished.  HENT:  Head: Normocephalic.  Nose: Nose normal.  Mouth/Throat: Oropharynx is clear and moist.  Eyes: Conjunctivae and EOM are normal. Pupils are equal, round, and reactive to light.  Neck: Normal range of motion.  Cardiovascular: Normal rate and regular rhythm.   Pulmonary/Chest: Effort normal and breath sounds normal.  Musculoskeletal: Normal range of motion.       Varus moment at the right knee with weight bearing  Neurological: He is alert and oriented to person, place, and time.       Right hemisensory loss. Left hemifacial sensory loss. Slightly wide based gait. A little unsure of placement of the right leg with gait.  4-5 beats of nystagmus with gaze to the left. Visual acuity fair. Has difficulty reading material up close without his glasses. Can read signs on the wall while in the exam room.   Psychiatric: He has a normal mood and affect. His behavior is normal. Judgment and thought content normal.          Assessment & Plan:  ASSESSMENT:  1. 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 2. Hypertension.  PLAN:  1. Continue with actiivty as tolerated.  He has continued to make nice progress. 2. Will make a referral to neuro-opthalmology, Dr. Aura Camps. He continues to have nystagmus with left  lateral gaze, and i feel that most of his problem remains stroke/vestibular related. I would like to obtain Dr. Jilda Roche input in this regard. He probably needs a new rx however. 3.BP mgt per Dr. Kevan Ny 4. Continue with exercises for strength and coordination.  The patient  still uses cane for uneven surfaces and when he is out of the house  for longer periods of time, and may need to do so  long term.  5. He may follow up with me PRN. All questions were encouraged and answered.

## 2012-03-09 NOTE — Patient Instructions (Signed)
Call me with questions 

## 2012-05-04 ENCOUNTER — Other Ambulatory Visit: Payer: Self-pay | Admitting: Physical Medicine & Rehabilitation

## 2012-09-23 DIAGNOSIS — I639 Cerebral infarction, unspecified: Secondary | ICD-10-CM

## 2012-09-23 HISTORY — DX: Cerebral infarction, unspecified: I63.9

## 2013-10-11 ENCOUNTER — Encounter (INDEPENDENT_AMBULATORY_CARE_PROVIDER_SITE_OTHER): Payer: Self-pay

## 2013-10-11 ENCOUNTER — Ambulatory Visit (INDEPENDENT_AMBULATORY_CARE_PROVIDER_SITE_OTHER): Payer: Medicare Other | Admitting: Diagnostic Neuroimaging

## 2013-10-11 ENCOUNTER — Encounter: Payer: Self-pay | Admitting: Diagnostic Neuroimaging

## 2013-10-11 VITALS — BP 163/84 | HR 76 | Temp 97.0°F | Ht 71.0 in | Wt 210.0 lb

## 2013-10-11 DIAGNOSIS — I639 Cerebral infarction, unspecified: Secondary | ICD-10-CM

## 2013-10-11 DIAGNOSIS — I6340 Cerebral infarction due to embolism of unspecified cerebral artery: Secondary | ICD-10-CM

## 2013-10-11 DIAGNOSIS — G40909 Epilepsy, unspecified, not intractable, without status epilepticus: Secondary | ICD-10-CM

## 2013-10-11 MED ORDER — LAMOTRIGINE 100 MG PO TABS: ORAL_TABLET | ORAL | Status: DC

## 2013-10-11 NOTE — Progress Notes (Signed)
[facility]  PATIENT: Connor [name] DOB: 22-May-1946  REFERRING CLINICIAN:  HISTORY FROM: patient  REASON FOR VISIT: follow up (transfer, Dr. Sandria Manly)   HISTORICAL  CHIEF COMPLAINT:  Chief Complaint  Patient presents with  . Follow-up    Seizure.Marland KitchenMarland KitchenLove trans.#[id number]    HISTORY OF PRESENT ILLNESS:   UPDATE 10/11/13: Since last visit, stable. No seizures since Dec 2012. Still with some balance difficulty. Tolerating LTG.  PRIOR HPI (12/17/11): 67 year old right-handed white divorced male with a history of episodes in which his mind would suddenly blankout beginning in 1984.  It would be an unusual sensation and during the episodes he could not respond. He had an episode while on a plane trip to [facility] 10/1992 with a weird sensation and then a generalized major motor seizure.  The plane landed in [place]  and he was evaluated at the [facility]. CAT scan without dye, EKG, CBC, SMA 6, SMA 12, magnesium level were normal and  he was placed on Dilantin 300 mg per day. A second generalized major motor seizure occurred 04/1993 and  I first saw him 05/31/1993. He was drinking 42 beers per week. Because the seizures were simple partial and complex partial with secondary generalization. It was felt his seizures might not  be related to alcohol. 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 He was placed on Dilantin medication and subsequently switched to Lamictal in 2006. He  tried to taper lamotrigine several times and  had generalized major motor seizures. This occurred in 2009. He had a CAT scan for evaluation and a bone density scan 12/06/2004. The foreign body was removed from the right orbit . He previously had an MRI of his lumbar spine 12/13/2006 showing severe canal stenosis at L4-5 anterior listhesis of 4-5 mm and severe facet hypertrophy and a large synovial cyst emanating from the left facet joint. There were also  degenerative changes on the right at L5-S1 and facet hypertrophy at L5-S1. There was  disc bulging at L3-4.  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 He underwent radiation therapy and chemotherapy at [facility]. CT of the neck with contrast 07/12/11 showed no evidence of recurrent  disease. 7/12 he had a nocturnal generalized seizure. He awoke at night having bitten his tongue.  He had been trying to taper lamotrigine. 12/13/10 while [place] the [place] and drinking beer regularly during the  the day he had a seizure. 08/29/11 he had  another seizure.  In November, he  had persistent left-sided headache occurring on a daily basis. Wednesday 09/28/11, he was eating breakfast with friends in a restaurant and noted the onset of the left sided headache with numbness in the left forehead extending down the left side of his face and then involving his entire body with a  tingling sensation or numbness,worse on the left side of his body than the right. He became weak in both arms and both legs and slumped in his chair.  911 was called and he was seen at [facility] emergency room. CT scan of the brain without contrast was unremarkable and MRI study of the brain without and with contrast which was reviewed by Dr. Lennox Laity was negative for acute stroke. At home  he noted falling to his left, double vision, and oscillopsia. He did  have hiccups. 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  CT angiogram showed no evidence of posterior circulation stenosis or occlusion and TEE showed normal EF and no emboli source. In the hospital he had a seizure and Lamictal was increas to 100 mg AM and 200 mg PM.He was improving  but 2/9 after having one  beer he noticed dizziness and blurred vision  with vertigo and decreased balance. He was seen in the [facility] ER 12/04/11 MRI showing no evidence of acute stroke. He has slowly improved. He denies palpitations. He has spinning, double vision, hiccups,numbness left face and right body, and balance problems. He denies auras of seizures.  REVIEW OF SYSTEMS: Full 14 system review of systems performed and notable only for memory loss headache numbness weakness difficulty swallowing dizziness double vision hearing loss spinning sensations are grams a muscles.   ALLERGIES: Allergies  Allergen Reactions  . Morphine And Related Nausea And Vomiting    HOME MEDICATIONS: Outpatient Prescriptions Prior to Visit  Medication Sig Dispense Refill  . amLODipine-valsartan (EXFORGE) 5-160 MG per tablet Take 1 tablet by mouth daily.      Marland Kitchen aspirin 325 MG tablet Take 325 mg by mouth daily.      . naproxen sodium (ALEVE) 220 MG tablet Take 220 mg by mouth daily.      Marland Kitchen lamoTRIgine (LAMICTAL) 100 MG tablet TAKE 1 TABLET BY MOUTH EVERY MORNING AND 2 TABLETS BY MOUTH EVERY NIGHT AT BEDTIME  90 tablet  0  . simvastatin (ZOCOR) 20 MG tablet Take 1 tablet (20 mg total) by mouth daily at 6 PM.  30 tablet  1  . amLODipine (NORVASC) 5 MG tablet Take 1 tablet (5 mg total) by mouth daily.  30 tablet  1  . gabapentin (NEURONTIN) 100 MG capsule Take 100 mg by mouth 3 (three) times daily as needed. For pain      . gabapentin (NEURONTIN) 100 MG capsule Take 1 capsule (100 mg total) by mouth 3 (three) times daily.  90 capsule  1   No facility-administered medications prior to visit.    PAST MEDICAL HISTORY: Past Medical History  Diagnosis Date  . Head and neck cancer ~ 2009    S/P radiation & chem  . Stroke   . Hypertension   . Seizures     "the bad kind;bite tongue;see Dr.Love; last 07/2011 & 08/2011"  . Chronic kidney disease   . Cancer     h/o neck  . Kidney carcinoma     h/o  . Arthritis   . Hyperlipidemia      PAST SURGICAL HISTORY: Past Surgical History  Procedure Laterality Date  . Nephrectomy  1990's    left  . Joint replacement    . Total knee arthroplasty  2011    left  . Hydrocelectomy  11/2000    left  . Tee without cardioversion  10/04/2011    Procedure: TRANSESOPHAGEAL ECHOCARDIOGRAM (TEE);  Surgeon: Donato Schultz, MD;  Location: [facility] ENDOSCOPY;  Service: Cardiovascular;  Laterality: N/A;    FAMILY HISTORY: Family History  Problem Relation Age of Onset  . Diabetes Mother   . Heart disease Mother   . Heart disease Father     SOCIAL HISTORY:  History   Social History  . Marital Status: Divorced    Spouse Name: N/A  Number of Children: 2  . Years of Education: 12th   Occupational History  . owner Other    [employer]   Social History Main Topics  . Smoking status: Never Smoker   . Smokeless tobacco: Never Used  . Alcohol Use: 12.0 oz/week    20 Cans of beer per week     Comment: quit: 38yrs ago  . Drug Use: No  . Sexual Activity: Not Currently   Other Topics Concern  . Not on file   Social History Narrative  . No narrative on file     PHYSICAL EXAM  Filed Vitals:   10/11/13 1053  BP: 163/84  Pulse: 76  Temp: 97 F (36.1 C)  TempSrc: Oral  Height: 5\' 11"  (1.803 m)  Weight: 210 lb (95.255 kg)    Not recorded    Body mass index is 29.3 kg/(m^2).  GENERAL EXAM: Patient is in no distress; well developed, nourished and groomed; neck is supple  CARDIOVASCULAR: Regular rate and rhythm, no murmurs, no carotid bruits  NEUROLOGIC: MENTAL STATUS: awake, alert, oriented to person, place and time, recent and remote memory intact, normal attention and concentration, language fluent, comprehension intact, naming intact, fund of knowledge appropriate CRANIAL NERVE: no papilledema on fundoscopic exam, pupils reactive (RIGHT 3, LEFT 2), LEFT PTOSIS, visual fields full to confrontation, extraocular muscles intact, NYSTAGMUS IN LEFT GAZE; BLURRED  VISION / DIPLOPOIA IN RIGHT OR LEFT GAZE. Facial sensation DECR IN LEFT V1, V2; facial strength symmetric, hearing intact, palate elevates symmetrically, uvula midline, shoulder shrug symmetric, TONGUE DEVIATES TO THE LEFT. MOTOR: normal bulk and tone, full strength in the BUE, BLE SENSORY: DECR IN RIGHT ARM/LEG TO PP AND TEMP. VIB NORMAL. COORDINATION: DYSMETRIA IN LUE > RUE. REFLEXES: deep tendon reflexes present and symmetric GAIT/STATION: WIDE, SLOW, UNSTEADY GAIT.    DIAGNOSTIC DATA (LABS, IMAGING, TESTING) - I reviewed patient records, labs, notes, testing and imaging myself where available.  Lab Results  Component Value Date   WBC 4.6 12/05/2011   HGB 13.7 12/05/2011   HCT 39.1 12/05/2011   MCV 86.7 12/05/2011   PLT 223 12/05/2011      Component Value Date/Time   NA 139 12/05/2011 0730   K 3.9 12/05/2011 0730   CL 101 12/05/2011 0730   CO2 26 12/05/2011 0730   GLUCOSE 98 12/05/2011 0730   BUN 11 12/05/2011 0730   CREATININE 1.05 12/05/2011 0730   CALCIUM 10.0 12/05/2011 0730   PROT 7.1 12/05/2011 0730   ALBUMIN 3.8 12/05/2011 0730   AST 16 12/05/2011 0730   ALT 13 12/05/2011 0730   ALKPHOS 75 12/05/2011 0730   BILITOT 0.4 12/05/2011 0730   GFRNONAA 74* 12/05/2011 0730   GFRAA 85* 12/05/2011 0730   Lab Results  Component Value Date   CHOL 149 12/05/2011   HDL 56 12/05/2011   LDLCALC 68 12/05/2011   TRIG 123 12/05/2011   CHOLHDL 2.7 12/05/2011   Lab Results  Component Value Date   HGBA1C 5.3 12/05/2011   No results found for this basename: [id number]   Lab Results  Component Value Date   TSH 1.207 12/05/2011    ASSESSMENT AND PLAN  67 y.o. year old male here with history of seizure since  1994. 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 Overall stable.  PLAN: - CONTINUE current medications   No orders of the defined types were placed in this encounter.    Meds ordered this encounter  Medications  . lamoTRIgine (LAMICTAL) 100 MG  tablet    Sig: 1 tab qAM, 2 tabs qPM    Dispense:  90 tablet    Refill:  12    Return in about 1 year (around 10/11/2014) for with Heide Guile or Kevyn Wengert.    Suanne Marker, MD 10/11/2013, 11:46 AM Certified in Neurology, Neurophysiology and Neuroimaging  [facility] [address] [place], [place] [address] [phone]

## 2013-10-11 NOTE — Patient Instructions (Signed)
Continue current medications. 

## 2013-10-24 DIAGNOSIS — IMO0001 Reserved for inherently not codable concepts without codable children: Secondary | ICD-10-CM

## 2013-10-24 HISTORY — DX: Reserved for inherently not codable concepts without codable children: IMO0001

## 2014-01-23 ENCOUNTER — Other Ambulatory Visit: Payer: Self-pay | Admitting: Orthopedic Surgery

## 2014-02-06 ENCOUNTER — Encounter: Payer: Self-pay | Admitting: *Deleted

## 2014-02-13 ENCOUNTER — Encounter (HOSPITAL_COMMUNITY): Payer: Self-pay | Admitting: Pharmacy Technician

## 2014-02-17 ENCOUNTER — Encounter (HOSPITAL_COMMUNITY): Payer: Self-pay

## 2014-02-17 ENCOUNTER — Ambulatory Visit (HOSPITAL_COMMUNITY)
Admission: RE | Admit: 2014-02-17 | Discharge: 2014-02-17 | Disposition: A | Discharge status: Home / Self Care | Payer: Medicare Other | Source: Ambulatory Visit | Attending: Anesthesiology | Admitting: Anesthesiology

## 2014-02-17 ENCOUNTER — Encounter (HOSPITAL_COMMUNITY)
Admission: RE | Admit: 2014-02-17 | Discharge: 2014-02-17 | Disposition: A | Discharge status: Home / Self Care | Payer: Medicare Other | Source: Ambulatory Visit | Attending: Orthopedic Surgery | Admitting: Orthopedic Surgery

## 2014-02-17 DIAGNOSIS — I10 Essential (primary) hypertension: Secondary | ICD-10-CM | POA: Insufficient documentation

## 2014-02-17 DIAGNOSIS — Z01812 Encounter for preprocedural laboratory examination: Secondary | ICD-10-CM | POA: Insufficient documentation

## 2014-02-17 DIAGNOSIS — Z01818 Encounter for other preprocedural examination: Secondary | ICD-10-CM | POA: Insufficient documentation

## 2014-02-17 DIAGNOSIS — Z01810 Encounter for preprocedural cardiovascular examination: Secondary | ICD-10-CM | POA: Insufficient documentation

## 2014-02-17 HISTORY — DX: Headache: R51

## 2014-02-17 LAB — COMPREHENSIVE METABOLIC PANEL
ALT: 32 U/L (ref 0–53)
AST: 31 U/L (ref 0–37)
Albumin: 4.4 g/dL (ref 3.5–5.2)
Alkaline Phosphatase: 87 U/L (ref 39–117)
BUN: 18 mg/dL (ref 6–23)
CO2: 28 mEq/L (ref 19–32)
Calcium: 10.3 mg/dL (ref 8.4–10.5)
Chloride: 93 mEq/L — ABNORMAL LOW (ref 96–112)
Creatinine, Ser: 1.24 mg/dL (ref 0.50–1.35)
GFR calc Af Amer: 68 mL/min — ABNORMAL LOW (ref >90)
GFR calc non Af Amer: 59 mL/min — ABNORMAL LOW (ref >90)
Glucose, Bld: 102 mg/dL — ABNORMAL HIGH (ref 70–99)
Potassium: 5.5 mEq/L — ABNORMAL HIGH (ref 3.7–5.3)
Sodium: 132 mEq/L — ABNORMAL LOW (ref 137–147)
Total Bilirubin: 0.4 mg/dL (ref 0.3–1.2)
Total Protein: 8.3 g/dL (ref 6.0–8.3)

## 2014-02-17 LAB — URINALYSIS, ROUTINE W REFLEX MICROSCOPIC
Bilirubin Urine: NEGATIVE
Glucose, UA: NEGATIVE mg/dL
Hgb urine dipstick: NEGATIVE
Ketones, ur: NEGATIVE mg/dL
Leukocytes, UA: NEGATIVE
Nitrite: NEGATIVE
Protein, ur: NEGATIVE mg/dL
Specific Gravity, Urine: 1.008 (ref 1.005–1.030)
Urobilinogen, UA: 0.2 mg/dL (ref 0.0–1.0)
pH: 6 (ref 5.0–8.0)

## 2014-02-17 LAB — SURGICAL PCR SCREEN
MRSA, PCR: NEGATIVE
STAPHYLOCOCCUS AUREUS: NEGATIVE

## 2014-02-17 LAB — CBC
HEMATOCRIT: 42.7 % (ref 39.0–52.0)
HEMOGLOBIN: 14.8 g/dL (ref 13.0–17.0)
MCH: 30.2 pg (ref 26.0–34.0)
MCHC: 34.7 g/dL (ref 30.0–36.0)
MCV: 87.1 fL (ref 78.0–100.0)
Platelets: 248 10*3/uL (ref 150–400)
RBC: 4.9 MIL/uL (ref 4.22–5.81)
RDW: 12.9 % (ref 11.5–15.5)
WBC: 6.7 10*3/uL (ref 4.0–10.5)

## 2014-02-17 LAB — PROTIME-INR
INR: 0.99 (ref 0.00–1.49)
Prothrombin Time: 12.9 seconds (ref 11.6–15.2)

## 2014-02-17 LAB — APTT: aPTT: 33 seconds (ref 24–37)

## 2014-02-17 NOTE — Patient Instructions (Addendum)
YOUR SURGERY IS SCHEDULED AT [facility]  ON:  Monday  5/4  REPORT TO  SHORT STAY CENTER AT:  6:30 AM      PHONE # FOR SHORT STAY IS [phone]  DO NOT EAT OR DRINK ANYTHING AFTER MIDNIGHT THE NIGHT BEFORE YOUR SURGERY.  YOU MAY BRUSH YOUR TEETH, RINSE OUT YOUR MOUTH--BUT NO WATER, NO FOOD, NO CHEWING GUM, NO MINTS, NO CANDIES, NO CHEWING TOBACCO.  PLEASE TAKE THE FOLLOWING MEDICATIONS THE AM OF YOUR SURGERY WITH A FEW SIPS OF WATER:  LAMOTRIGINE ( LAMICTAL )   DO NOT BRING VALUABLES, MONEY, CREDIT CARDS.  DO NOT WEAR JEWELRY, MAKE-UP, NAIL POLISH AND NO METAL PINS OR CLIPS IN YOUR HAIR. CONTACT LENS, DENTURES / PARTIALS, GLASSES SHOULD NOT BE WORN TO SURGERY AND IN MOST CASES-HEARING AIDS WILL NEED TO BE REMOVED.  BRING YOUR GLASSES CASE, ANY EQUIPMENT NEEDED FOR YOUR CONTACT LENS. FOR PATIENTS ADMITTED TO THE HOSPITAL--CHECK OUT TIME THE DAY OF DISCHARGE IS 11:00 AM.  ALL INPATIENT ROOMS ARE PRIVATE - WITH BATHROOM, TELEPHONE, TELEVISION AND WIFI INTERNET.                                                    PLEASE READ OVER ANY  FACT SHEETS THAT YOU WERE GIVEN: MRSA INFORMATION, BLOOD TRANSFUSION INFORMATION, INCENTIVE SPIROMETER INFORMATION.  PLEASE BE AWARE THAT YOU MAY NEED ADDITIONAL BLOOD DRAWN DAY OF YOUR SURGERY  _______________________________________________________________________   [facility] - Preparing for Surgery Before surgery, you can play an important role.  Because skin is not sterile, your skin needs to be as free of germs as possible.  You can reduce the number of germs on your skin by washing with CHG (chlorahexidine gluconate) soap before surgery.  CHG is an antiseptic cleaner which kills germs and bonds with the skin to continue killing germs even after washing. Please DO NOT use if you have an allergy to CHG or antibacterial soaps.  If your skin becomes reddened/irritated stop using the CHG and inform your nurse when you arrive at Short Stay. Do  not shave (including legs and underarms) for at least 48 hours prior to the first CHG shower.  You may shave your face. Please follow these instructions carefully:  1.  Shower with CHG Soap the night before surgery and the  morning of Surgery.  2.  If you choose to wash your hair, wash your hair first as usual with your  normal  shampoo.  3.  After you shampoo, rinse your hair and body thoroughly to remove the  shampoo.                           4.  Use CHG as you would any other liquid soap.  You can apply chg directly  to the skin and wash                       Gently with a scrungie or clean washcloth.  5.  Apply the CHG Soap to your body ONLY FROM THE NECK DOWN.   Do not use on open                           Wound or open sores.  Avoid contact with eyes, ears mouth and genitals (private parts).                        Genitals (private parts) with your normal soap.             6.  Wash thoroughly, paying special attention to the area where your surgery  will be performed.  7.  Thoroughly rinse your body with warm water from the neck down.  8.  DO NOT shower/wash with your normal soap after using and rinsing off  the CHG Soap.                9.  Pat yourself dry with a clean towel.            10.  Wear clean pajamas.            11.  Place clean sheets on your bed the night of your first shower and do not  sleep with pets. Day of Surgery : Do not apply any lotions/deodorants the morning of surgery.  Please wear clean clothes to the hospital/surgery center.  FAILURE TO FOLLOW THESE INSTRUCTIONS MAY RESULT IN THE CANCELLATION OF YOUR SURGERY PATIENT SIGNATURE_________________________________  NURSE SIGNATURE__________________________________  ________________________________________________________________________   Connor [name]  An incentive spirometer is a tool that can help keep your lungs clear and active. This tool measures how well you are filling your lungs with each breath.  Taking long deep breaths may help reverse or decrease the chance of developing breathing (pulmonary) problems (especially infection) following:  A long period of time when you are unable to move or be active. BEFORE THE PROCEDURE   If the spirometer includes an indicator to show your best effort, your nurse or respiratory therapist will set it to a desired goal.  If possible, sit up straight or lean slightly forward. Try not to slouch.  Hold the incentive spirometer in an upright position. INSTRUCTIONS FOR USE  1. Sit on the edge of your bed if possible, or sit up as far as you can in bed or on a chair. 2. Hold the incentive spirometer in an upright position. 3. Breathe out normally. 4. Place the mouthpiece in your mouth and seal your lips tightly around it. 5. Breathe in slowly and as deeply as possible, raising the piston or the ball toward the top of the column. 6. Hold your breath for 3-5 seconds or for as long as possible. Allow the piston or ball to fall to the bottom of the column. 7. Remove the mouthpiece from your mouth and breathe out normally. 8. Rest for a few seconds and repeat Steps 1 through 7 at least 10 times every 1-2 hours when you are awake. Take your time and take a few normal breaths between deep breaths. 9. The spirometer may include an indicator to show your best effort. Use the indicator as a goal to work toward during each repetition. 10. After each set of 10 deep breaths, practice coughing to be sure your lungs are clear. If you have an incision (the cut made at the time of surgery), support your incision when coughing by placing a pillow or rolled up towels firmly against it. Once you are able to get out of bed, walk around indoors and cough well. You may stop using the incentive spirometer when instructed by your caregiver.  RISKS AND COMPLICATIONS  Take your time so you do not get dizzy or light-headed.  If you  are in pain, you may need to take or ask for pain  medication before doing incentive spirometry. It is harder to take a deep breath if you are having pain. AFTER USE  Rest and breathe slowly and easily.  It can be helpful to keep track of a log of your progress. Your caregiver can provide you with a simple table to help with this. If you are using the spirometer at home, follow these instructions: [place] IF:   You are having difficultly using the spirometer.  You have trouble using the spirometer as often as instructed.  Your pain medication is not giving enough relief while using the spirometer.  You develop fever of 100.5 F (38.1 C) or higher. SEEK IMMEDIATE MEDICAL CARE IF:   You cough up bloody sputum that had not been present before.  You develop fever of 102 F (38.9 C) or greater.  You develop worsening pain at or near the incision site. MAKE SURE YOU:   Understand these instructions.  Will watch your condition.  Will get help right away if you are not doing well or get worse. Document Released: 02/20/2007 Document Revised: 01/02/2012 Document Reviewed: 04/23/2007 ExitCare Patient Information 2014 Exit[employer], [place].   ________________________________________________________________________  WHAT IS A BLOOD TRANSFUSION? Blood Transfusion Information  A transfusion is the replacement of blood or some of its parts. Blood is made up of multiple cells which provide different functions.  Red blood cells carry oxygen and are used for blood loss replacement.  White blood cells fight against infection.  Platelets control bleeding.  Plasma helps clot blood.  Other blood products are available for specialized needs, such as hemophilia or other clotting disorders. BEFORE THE TRANSFUSION  Who gives blood for transfusions?   Healthy volunteers who are fully evaluated to make sure their blood is safe. This is blood bank blood. Transfusion therapy is the safest it has ever been in the practice of medicine.  Before blood is taken from a donor, a complete history is taken to make sure that person has no history of diseases nor engages in risky social behavior (examples are intravenous drug use or sexual activity with multiple partners). The donor's travel history is screened to minimize risk of transmitting infections, such as malaria. The donated blood is tested for signs of infectious diseases, such as HIV and hepatitis. The blood is then tested to be sure it is compatible with you in order to minimize the chance of a transfusion reaction. If you or a relative donates blood, this is often done in anticipation of surgery and is not appropriate for emergency situations. It takes many days to process the donated blood. RISKS AND COMPLICATIONS Although transfusion therapy is very safe and saves many lives, the main dangers of transfusion include:   Getting an infectious disease.  Developing a transfusion reaction. This is an allergic reaction to something in the blood you were given. Every precaution is taken to prevent this. The decision to have a blood transfusion has been considered carefully by your caregiver before blood is given. Blood is not given unless the benefits outweigh the risks. AFTER THE TRANSFUSION  Right after receiving a blood transfusion, you will usually feel much better and more energetic. This is especially true if your red blood cells have gotten low (anemic). The transfusion raises the level of the red blood cells which carry oxygen, and this usually causes an energy increase.  The nurse administering the transfusion will monitor you carefully for complications. HOME  CARE INSTRUCTIONS  No special instructions are needed after a transfusion. You may find your energy is better. Speak with your caregiver about any limitations on activity for underlying diseases you may have. SEEK MEDICAL CARE IF:   Your condition is not improving after your transfusion.  You develop redness or  irritation at the intravenous (IV) site. SEEK IMMEDIATE MEDICAL CARE IF:  Any of the following symptoms occur over the next 12 hours:  Shaking chills.  You have a temperature by mouth above 102 F (38.9 C), not controlled by medicine.  Chest, back, or muscle pain.  People around you feel you are not acting correctly or are confused.  Shortness of breath or difficulty breathing.  Dizziness and fainting.  You get a rash or develop hives.  You have a decrease in urine output.  Your urine turns a dark color or changes to pink, red, or brown. Any of the following symptoms occur over the next 10 days:  You have a temperature by mouth above 102 F (38.9 C), not controlled by medicine.  Shortness of breath.  Weakness after normal activity.  The white part of the eye turns yellow (jaundice).  You have a decrease in the amount of urine or are urinating less often.  Your urine turns a dark color or changes to pink, red, or brown. Document Released: 10/07/2000 Document Revised: 01/02/2012 Document Reviewed: 05/26/2008 [facility] Patient Information 2014 [place], [place].  _______________________________________________________________________

## 2014-02-17 NOTE — Progress Notes (Signed)
02/17/14 1430  OBSTRUCTIVE SLEEP APNEA  Have you ever been diagnosed with sleep apnea through a sleep study? No  Do you snore loudly (loud enough to be heard through closed doors)?  0  Do you often feel tired, fatigued, or sleepy during the daytime? 0  Has anyone observed you stop breathing during your sleep? 0  Do you have, or are you being treated for high blood pressure? 1  BMI more than 35 kg/m2? 0  Age over 66 years old? 1  Neck circumference greater than 40 cm/16 inches? 1  Gender: 1  Obstructive Sleep Apnea Score 4  Score 4 or greater  Results sent to PCP

## 2014-02-17 NOTE — Pre-Procedure Instructions (Signed)
EKG AND CXR WERE DONE TODAY - PREOP AT [facility]. 

## 2014-02-21 NOTE — H&P (Signed)
TOTAL KNEE ADMISSION H&P  Patient is being admitted for right total knee arthroplasty.  Subjective:  Chief Complaint:right knee pain.  HPI: Connor [name], 68 y.o. male, has a history of pain and functional disability in the right knee due to arthritis and has failed non-surgical conservative treatments for greater than 12 weeks to includeNSAID's and/or analgesics, corticosteriod injections and activity modification.  Onset of symptoms was gradual, starting 3 years ago with gradually worsening course since that time. The patient noted no past surgery on the right knee(s).  Patient currently rates pain in the right knee(s) at 7 out of 10 with activity. Patient has night pain, worsening of pain with activity and weight bearing, pain that interferes with activities of daily living, pain with passive range of motion, crepitus and joint swelling.  Patient has evidence of periarticular osteophytes and joint space narrowing by imaging studies.There is no active infection.  Patient Active Problem List   Diagnosis Date Noted  . Dizziness 12/04/2011  . Hyperlipidemia LDL goal < 100 10/05/2011  . 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 CVA (cerebral infarction) 10/05/2011  . Stroke, acute, embolic 02/72/5366  . HTN (hypertension) 09/30/2011  . Seizure disorder 09/30/2011   Past Medical History  Diagnosis Date  . Head and neck cancer ~ 2009    S/P radiation & chem  . Hypertension   . Chronic kidney disease   . Hyperlipidemia   . Stroke DEC 2013    UNABLE TO SPEAK OR MOVE AND RT SIDE WEAKNESS AND LOSS OF SKIN SENSITIVITY TO HEAT AND COLD ON RT SIDE, DOUBLE VISION. BALANCE PROBLEMS---STATES STILL HAS DOUBLE VISION AND BALANCE PROBLEM AND RT SIDED LOSS OF SKIN SENSITIVITY  . Cancer     h/o neck - ABOUT 6 YRS AGO - TX'D WITH RADIATION AND CHEMO   . Kidney carcinoma     h/o - NEPHRECTOMY   . Seizures     "the bad kind;bite tongue; STATES LAST [place] 2013; WAS SEEING [name] -  HE RETIRED AND PT LAST SAW DR. [name]  . Headache(784.0)   . Arthritis     OA AND PAIN RT KNEE    Past Surgical History  Procedure Laterality Date  . Nephrectomy  1990's    left  . Total knee arthroplasty  2011    left  . Hydrocelectomy  11/2000    left  . Tee without cardioversion  10/04/2011    Procedure: TRANSESOPHAGEAL ECHOCARDIOGRAM (TEE);  Surgeon: Candee Furbish, MD;  Location: [facility] ENDOSCOPY;  Service: Cardiovascular;  Laterality: N/A;  . Joint replacement      LEFT TOTAL KNEE ARTHROPLASTY     Current outpatient prescriptions: amLODipine-valsartan (EXFORGE) 5-160 MG per tablet, Take 1 tablet by mouth every morning. , Disp: , Rfl: ;   aspirin EC 81 MG tablet, Take 162 mg by mouth daily., Disp: , Rfl: ;   lamoTRIgine (LAMICTAL) 100 MG tablet, Take 100-200 mg by mouth 2 (two) times daily. Takes 1 in the morning and 2 at night, Disp: , Rfl: ;   naproxen sodium (ALEVE) 220 MG tablet, Take 220 mg by mouth daily., Disp: , Rfl:  simvastatin (ZOCOR) 20 MG tablet, Take 20 mg by mouth every evening., Disp: , Rfl:   Allergies  Allergen Reactions  . Morphine And Related Nausea And Vomiting    History  Substance Use Topics  . Smoking status: Never Smoker   . Smokeless tobacco: Never Used  . Alcohol Use: Yes  Comment: WAS 6 TO 8 BEERS DAILY - CUT BACK TO 4 BEERS DAILY    Family History  Problem Relation Age of Onset  . Diabetes Mother   . Heart disease Mother   . Heart disease Father      Review of Systems  Constitutional: Negative.   HENT: Positive for hearing loss. Negative for congestion, ear discharge, ear pain, nosebleeds, sore throat and tinnitus.   Eyes: Positive for double vision. Negative for blurred vision, photophobia, pain, discharge and redness.  Respiratory: Positive for cough. Negative for hemoptysis, sputum production, shortness of breath, wheezing and stridor.   Cardiovascular: Negative.   Gastrointestinal: Negative.   Genitourinary: Negative.    Musculoskeletal: Positive for joint pain. Negative for back pain, falls, myalgias and neck pain.       Right knee pain  Skin: Negative.   Neurological: Negative.  Negative for headaches.  Endo/Heme/Allergies: Negative.   Psychiatric/Behavioral: Positive for memory loss. Negative for depression, suicidal ideas, hallucinations and substance abuse. The patient is not nervous/anxious and does not have insomnia.     Objective:  Physical Exam  Constitutional: He is oriented to person, place, and time. He appears well-developed and well-nourished. No distress.  HENT:  Head: Normocephalic and atraumatic.  Right Ear: External ear normal.  Left Ear: External ear normal.  Nose: Nose normal.  Mouth/Throat: Oropharynx is clear and moist.  Eyes: EOM are normal.  Neck: Normal range of motion. Neck supple.  Cardiovascular: Normal rate, regular rhythm, normal heart sounds and intact distal pulses.   No murmur heard. Respiratory: Effort normal. No respiratory distress. He has wheezes.  GI: Soft. Bowel sounds are normal. He exhibits no distension. There is no tenderness.  Musculoskeletal:       Right hip: Normal.       Left hip: Normal.       Right knee: He exhibits decreased range of motion and swelling. He exhibits no effusion and no erythema. Tenderness found. Medial joint line and lateral joint line tenderness noted.       Left knee: Normal.       Right lower leg: He exhibits no tenderness and no swelling.       Left lower leg: He exhibits no tenderness and no swelling.  There is no swelling at all in the left knee. Range is 0-125 with no tenderness or instability. The right knee with a varus deformity. Range is 5-125. There is moderate crepitus on range of motion, tender medial greater than lateral. No instability is noted.  Neurological: He is alert and oriented to person, place, and time. He has normal strength and normal reflexes. No sensory deficit.  Skin: No rash noted. He is not  diaphoretic. No erythema.  Psychiatric: He has a normal mood and affect. His behavior is normal.      Imaging Review Plain radiographs demonstrate severe degenerative joint disease of the right knee(s). The overall alignment ismild varus. The bone quality appears to be good for age and reported activity level.  Assessment/Plan:  End stage arthritis, right knee   The patient history, physical examination, clinical judgment of the provider and imaging studies are consistent with end stage degenerative joint disease of the right knee(s) and total knee arthroplasty is deemed medically necessary. The treatment options including medical management, injection therapy arthroscopy and arthroplasty were discussed at length. The risks and benefits of total knee arthroplasty were presented and reviewed. The risks due to aseptic loosening, infection, stiffness, patella tracking problems, thromboembolic complications and other  imponderables were discussed. The patient acknowledged the explanation, agreed to proceed with the plan and consent was signed. Patient is being admitted for inpatient treatment for surgery, pain control, PT, OT, prophylactic antibiotics, VTE prophylaxis, progressive ambulation and ADL's and discharge planning. The patient is planning to be discharged home with home health services   [place], PA-C

## 2014-02-24 ENCOUNTER — Encounter (HOSPITAL_COMMUNITY): Payer: Medicare Other | Admitting: Anesthesiology

## 2014-02-24 ENCOUNTER — Inpatient Hospital Stay (HOSPITAL_COMMUNITY)
Admission: RE | Admit: 2014-02-24 | Discharge: 2014-02-26 | DRG: 470 | Disposition: A | Discharge status: Home Health | Payer: Medicare Other | Source: Ambulatory Visit | Attending: Orthopedic Surgery | Admitting: Orthopedic Surgery

## 2014-02-24 ENCOUNTER — Encounter (HOSPITAL_COMMUNITY): Payer: Self-pay | Admitting: *Deleted

## 2014-02-24 ENCOUNTER — Inpatient Hospital Stay (HOSPITAL_COMMUNITY): Payer: Medicare Other | Admitting: Anesthesiology

## 2014-02-24 ENCOUNTER — Encounter (HOSPITAL_COMMUNITY)
Admission: RE | Disposition: A | Discharge status: Home Health | Payer: Self-pay | Source: Ambulatory Visit | Attending: Orthopedic Surgery

## 2014-02-24 DIAGNOSIS — Z79899 Other long term (current) drug therapy: Secondary | ICD-10-CM

## 2014-02-24 DIAGNOSIS — Z905 Acquired absence of kidney: Secondary | ICD-10-CM

## 2014-02-24 DIAGNOSIS — Z833 Family history of diabetes mellitus: Secondary | ICD-10-CM

## 2014-02-24 DIAGNOSIS — M1710 Unilateral primary osteoarthritis, unspecified knee: Secondary | ICD-10-CM | POA: Diagnosis present

## 2014-02-24 DIAGNOSIS — Z8249 Family history of ischemic heart disease and other diseases of the circulatory system: Secondary | ICD-10-CM

## 2014-02-24 DIAGNOSIS — M179 Osteoarthritis of knee, unspecified: Secondary | ICD-10-CM

## 2014-02-24 DIAGNOSIS — N189 Chronic kidney disease, unspecified: Secondary | ICD-10-CM | POA: Diagnosis present

## 2014-02-24 DIAGNOSIS — D62 Acute posthemorrhagic anemia: Secondary | ICD-10-CM

## 2014-02-24 DIAGNOSIS — Z96651 Presence of right artificial knee joint: Secondary | ICD-10-CM

## 2014-02-24 DIAGNOSIS — Z8589 Personal history of malignant neoplasm of other organs and systems: Secondary | ICD-10-CM

## 2014-02-24 DIAGNOSIS — Z85528 Personal history of other malignant neoplasm of kidney: Secondary | ICD-10-CM

## 2014-02-24 DIAGNOSIS — I129 Hypertensive chronic kidney disease with stage 1 through stage 4 chronic kidney disease, or unspecified chronic kidney disease: Secondary | ICD-10-CM | POA: Diagnosis present

## 2014-02-24 DIAGNOSIS — Z885 Allergy status to narcotic agent status: Secondary | ICD-10-CM

## 2014-02-24 DIAGNOSIS — E871 Hypo-osmolality and hyponatremia: Secondary | ICD-10-CM | POA: Diagnosis not present

## 2014-02-24 DIAGNOSIS — E785 Hyperlipidemia, unspecified: Secondary | ICD-10-CM | POA: Diagnosis present

## 2014-02-24 DIAGNOSIS — Z923 Personal history of irradiation: Secondary | ICD-10-CM

## 2014-02-24 DIAGNOSIS — G40909 Epilepsy, unspecified, not intractable, without status epilepticus: Secondary | ICD-10-CM | POA: Diagnosis present

## 2014-02-24 DIAGNOSIS — Z8673 Personal history of transient ischemic attack (TIA), and cerebral infarction without residual deficits: Secondary | ICD-10-CM

## 2014-02-24 DIAGNOSIS — Z7982 Long term (current) use of aspirin: Secondary | ICD-10-CM

## 2014-02-24 DIAGNOSIS — Z791 Long term (current) use of non-steroidal anti-inflammatories (NSAID): Secondary | ICD-10-CM

## 2014-02-24 HISTORY — PX: TOTAL KNEE ARTHROPLASTY: SHX125

## 2014-02-24 LAB — TYPE AND SCREEN
ABO/RH(D): O POS
Antibody Screen: NEGATIVE

## 2014-02-24 SURGERY — ARTHROPLASTY, KNEE, TOTAL
Anesthesia: General | Site: Knee | Laterality: Right

## 2014-02-24 MED ORDER — HYDROMORPHONE HCL PF 1 MG/ML IJ SOLN: 0.5000 mg | INTRAMUSCULAR | Status: DC | PRN

## 2014-02-24 MED ORDER — BISACODYL 10 MG RE SUPP: 10.0000 mg | Freq: Every day | RECTAL | Status: DC | PRN

## 2014-02-24 MED ORDER — AMLODIPINE BESYLATE 5 MG PO TABS
5.0000 mg | ORAL_TABLET | Freq: Every day | ORAL | Status: DC
  Administered 2014-02-24: 5 mg via ORAL
  Filled 2014-02-24 (×2): qty 1

## 2014-02-24 MED ORDER — SODIUM CHLORIDE 0.9 % IJ SOLN
INTRAMUSCULAR | Status: AC
  Filled 2014-02-24: qty 10

## 2014-02-24 MED ORDER — ACETAMINOPHEN 500 MG PO TABS
1000.0000 mg | ORAL_TABLET | Freq: Four times a day (QID) | ORAL | Status: AC
  Administered 2014-02-24 – 2014-02-25 (×4): 1000 mg via ORAL
  Filled 2014-02-24 (×4): qty 2

## 2014-02-24 MED ORDER — 0.9 % SODIUM CHLORIDE (POUR BTL) OPTIME
TOPICAL | Status: DC | PRN
  Administered 2014-02-24: 1000 mL

## 2014-02-24 MED ORDER — METHOCARBAMOL 500 MG PO TABS
500.0000 mg | ORAL_TABLET | Freq: Four times a day (QID) | ORAL | Status: DC | PRN
  Administered 2014-02-25: 500 mg via ORAL
  Filled 2014-02-24: qty 1

## 2014-02-24 MED ORDER — SODIUM CHLORIDE 0.9 % IJ SOLN
INTRAMUSCULAR | Status: DC | PRN
  Administered 2014-02-24: 30 mL

## 2014-02-24 MED ORDER — PHENYLEPHRINE HCL 10 MG/ML IJ SOLN
INTRAMUSCULAR | Status: DC | PRN
  Administered 2014-02-24 (×2): 80 ug via INTRAVENOUS
  Administered 2014-02-24: 40 ug via INTRAVENOUS
  Administered 2014-02-24 (×2): 80 ug via INTRAVENOUS
  Administered 2014-02-24: 40 ug via INTRAVENOUS

## 2014-02-24 MED ORDER — ACETAMINOPHEN 650 MG RE SUPP: 650.0000 mg | Freq: Four times a day (QID) | RECTAL | Status: DC | PRN

## 2014-02-24 MED ORDER — DEXAMETHASONE SODIUM PHOSPHATE 10 MG/ML IJ SOLN
INTRAMUSCULAR | Status: AC
  Filled 2014-02-24: qty 1

## 2014-02-24 MED ORDER — RIVAROXABAN 10 MG PO TABS
10.0000 mg | ORAL_TABLET | Freq: Every day | ORAL | Status: DC
  Administered 2014-02-25 – 2014-02-26 (×2): 10 mg via ORAL
  Filled 2014-02-24 (×3): qty 1

## 2014-02-24 MED ORDER — DEXAMETHASONE SODIUM PHOSPHATE 10 MG/ML IJ SOLN
10.0000 mg | Freq: Every day | INTRAMUSCULAR | Status: AC
  Filled 2014-02-24: qty 1

## 2014-02-24 MED ORDER — HYDROMORPHONE HCL 2 MG PO TABS
ORAL_TABLET | ORAL | Status: AC
  Filled 2014-02-24: qty 2

## 2014-02-24 MED ORDER — SIMVASTATIN 20 MG PO TABS
20.0000 mg | ORAL_TABLET | Freq: Every evening | ORAL | Status: DC
  Administered 2014-02-24 – 2014-02-25 (×2): 20 mg via ORAL
  Filled 2014-02-24 (×3): qty 1

## 2014-02-24 MED ORDER — KETOROLAC TROMETHAMINE 15 MG/ML IJ SOLN: 7.5000 mg | Freq: Four times a day (QID) | INTRAMUSCULAR | Status: AC | PRN

## 2014-02-24 MED ORDER — TRAMADOL HCL 50 MG PO TABS
50.0000 mg | ORAL_TABLET | Freq: Four times a day (QID) | ORAL | Status: DC | PRN
  Administered 2014-02-24 – 2014-02-25 (×2): 50 mg via ORAL
  Filled 2014-02-24 (×2): qty 1

## 2014-02-24 MED ORDER — PHENOL 1.4 % MT LIQD
1.0000 | OROMUCOSAL | Status: DC | PRN
  Filled 2014-02-24: qty 177

## 2014-02-24 MED ORDER — AMLODIPINE BESYLATE-VALSARTAN 5-160 MG PO TABS: 1.0000 | ORAL_TABLET | Freq: Every morning | ORAL | Status: DC

## 2014-02-24 MED ORDER — METHOCARBAMOL 1000 MG/10ML IJ SOLN
500.0000 mg | Freq: Four times a day (QID) | INTRAVENOUS | Status: DC | PRN
  Administered 2014-02-24: 500 mg via INTRAVENOUS
  Filled 2014-02-24: qty 5

## 2014-02-24 MED ORDER — EPHEDRINE SULFATE 50 MG/ML IJ SOLN
INTRAMUSCULAR | Status: DC | PRN
  Administered 2014-02-24 (×2): 10 mg via INTRAVENOUS

## 2014-02-24 MED ORDER — MIDAZOLAM HCL 2 MG/2ML IJ SOLN
INTRAMUSCULAR | Status: AC
  Filled 2014-02-24: qty 2

## 2014-02-24 MED ORDER — EPHEDRINE SULFATE 50 MG/ML IJ SOLN
INTRAMUSCULAR | Status: AC
  Filled 2014-02-24: qty 1

## 2014-02-24 MED ORDER — CHLORHEXIDINE GLUCONATE 4 % EX LIQD: 60.0000 mL | Freq: Once | CUTANEOUS | Status: DC

## 2014-02-24 MED ORDER — PHENYLEPHRINE 40 MCG/ML (10ML) SYRINGE FOR IV PUSH (FOR BLOOD PRESSURE SUPPORT)
PREFILLED_SYRINGE | INTRAVENOUS | Status: AC
  Filled 2014-02-24: qty 10

## 2014-02-24 MED ORDER — MENTHOL 3 MG MT LOZG
1.0000 | LOZENGE | OROMUCOSAL | Status: DC | PRN
  Filled 2014-02-24: qty 9

## 2014-02-24 MED ORDER — ACETAMINOPHEN 10 MG/ML IV SOLN
1000.0000 mg | Freq: Once | INTRAVENOUS | Status: AC
  Administered 2014-02-24: 1000 mg via INTRAVENOUS
  Filled 2014-02-24: qty 100

## 2014-02-24 MED ORDER — ONDANSETRON HCL 4 MG/2ML IJ SOLN
INTRAMUSCULAR | Status: DC | PRN
  Administered 2014-02-24: 4 mg via INTRAVENOUS

## 2014-02-24 MED ORDER — SODIUM CHLORIDE 0.9 % IV SOLN: INTRAVENOUS | Status: DC

## 2014-02-24 MED ORDER — STERILE WATER FOR IRRIGATION IR SOLN
Status: DC | PRN
  Administered 2014-02-24: 1500 mL

## 2014-02-24 MED ORDER — BUPIVACAINE HCL 0.25 % IJ SOLN
INTRAMUSCULAR | Status: DC | PRN
  Administered 2014-02-24: 20 mL

## 2014-02-24 MED ORDER — ACETAMINOPHEN 325 MG PO TABS
650.0000 mg | ORAL_TABLET | Freq: Four times a day (QID) | ORAL | Status: DC | PRN
  Administered 2014-02-26: 650 mg via ORAL
  Filled 2014-02-24: qty 2

## 2014-02-24 MED ORDER — HYDROMORPHONE HCL PF 1 MG/ML IJ SOLN
0.2500 mg | INTRAMUSCULAR | Status: DC | PRN
Start: 2014-02-24 — End: 2014-02-24
  Administered 2014-02-24 (×2): 0.5 mg via INTRAVENOUS

## 2014-02-24 MED ORDER — SODIUM CHLORIDE 0.9 % IV SOLN
INTRAVENOUS | Status: DC
  Administered 2014-02-24 – 2014-02-25 (×4): via INTRAVENOUS

## 2014-02-24 MED ORDER — LAMOTRIGINE 200 MG PO TABS
200.0000 mg | ORAL_TABLET | Freq: Every day | ORAL | Status: DC
  Administered 2014-02-24 – 2014-02-25 (×2): 200 mg via ORAL
  Filled 2014-02-24 (×3): qty 1

## 2014-02-24 MED ORDER — CEFAZOLIN SODIUM-DEXTROSE 2-3 GM-% IV SOLR
2.0000 g | Freq: Four times a day (QID) | INTRAVENOUS | Status: AC
  Administered 2014-02-24 (×2): 2 g via INTRAVENOUS
  Filled 2014-02-24 (×2): qty 50

## 2014-02-24 MED ORDER — METOCLOPRAMIDE HCL 10 MG PO TABS: 5.0000 mg | ORAL_TABLET | Freq: Three times a day (TID) | ORAL | Status: DC | PRN

## 2014-02-24 MED ORDER — ONDANSETRON HCL 4 MG PO TABS: 4.0000 mg | ORAL_TABLET | Freq: Four times a day (QID) | ORAL | Status: DC | PRN

## 2014-02-24 MED ORDER — POLYETHYLENE GLYCOL 3350 17 G PO PACK: 17.0000 g | PACK | Freq: Every day | ORAL | Status: DC | PRN

## 2014-02-24 MED ORDER — LIDOCAINE HCL (CARDIAC) 20 MG/ML IV SOLN
INTRAVENOUS | Status: AC
  Filled 2014-02-24: qty 5

## 2014-02-24 MED ORDER — LIDOCAINE HCL (CARDIAC) 20 MG/ML IV SOLN
INTRAVENOUS | Status: DC | PRN
  Administered 2014-02-24: 100 mg via INTRAVENOUS

## 2014-02-24 MED ORDER — MEPERIDINE HCL 50 MG/ML IJ SOLN: 6.2500 mg | INTRAMUSCULAR | Status: DC | PRN

## 2014-02-24 MED ORDER — HYDROMORPHONE HCL PF 1 MG/ML IJ SOLN
INTRAMUSCULAR | Status: AC
  Filled 2014-02-24: qty 1

## 2014-02-24 MED ORDER — BUPIVACAINE HCL (PF) 0.25 % IJ SOLN
INTRAMUSCULAR | Status: AC
  Filled 2014-02-24: qty 30

## 2014-02-24 MED ORDER — ONDANSETRON HCL 4 MG/2ML IJ SOLN
INTRAMUSCULAR | Status: AC
  Filled 2014-02-24: qty 2

## 2014-02-24 MED ORDER — LACTATED RINGERS IV SOLN
INTRAVENOUS | Status: DC
  Administered 2014-02-24: 1000 mL via INTRAVENOUS
  Administered 2014-02-24: 10:00:00 via INTRAVENOUS

## 2014-02-24 MED ORDER — DEXAMETHASONE 6 MG PO TABS
10.0000 mg | ORAL_TABLET | Freq: Every day | ORAL | Status: AC
  Administered 2014-02-25: 10 mg via ORAL
  Filled 2014-02-24: qty 1

## 2014-02-24 MED ORDER — PROPOFOL 10 MG/ML IV BOLUS
INTRAVENOUS | Status: DC | PRN
  Administered 2014-02-24: 180 mg via INTRAVENOUS

## 2014-02-24 MED ORDER — SUCCINYLCHOLINE CHLORIDE 20 MG/ML IJ SOLN
INTRAMUSCULAR | Status: DC | PRN
  Administered 2014-02-24: 100 mg via INTRAVENOUS

## 2014-02-24 MED ORDER — LAMOTRIGINE 100 MG PO TABS
100.0000 mg | ORAL_TABLET | Freq: Every day | ORAL | Status: DC
  Administered 2014-02-24 – 2014-02-26 (×3): 100 mg via ORAL
  Filled 2014-02-24 (×3): qty 1

## 2014-02-24 MED ORDER — CEFAZOLIN SODIUM-DEXTROSE 2-3 GM-% IV SOLR
INTRAVENOUS | Status: AC
  Filled 2014-02-24: qty 50

## 2014-02-24 MED ORDER — HYDROMORPHONE HCL PF 1 MG/ML IJ SOLN
INTRAMUSCULAR | Status: DC | PRN
  Administered 2014-02-24: 1 mg via INTRAVENOUS

## 2014-02-24 MED ORDER — IRBESARTAN 150 MG PO TABS
150.0000 mg | ORAL_TABLET | Freq: Every day | ORAL | Status: DC
  Administered 2014-02-24: 150 mg via ORAL
  Filled 2014-02-24 (×2): qty 1

## 2014-02-24 MED ORDER — BUPIVACAINE LIPOSOME 1.3 % IJ SUSP
INTRAMUSCULAR | Status: DC | PRN
  Administered 2014-02-24: 20 mL

## 2014-02-24 MED ORDER — SODIUM CHLORIDE 0.9 % IJ SOLN
INTRAMUSCULAR | Status: AC
  Filled 2014-02-24: qty 50

## 2014-02-24 MED ORDER — FLEET ENEMA 7-19 GM/118ML RE ENEM: 1.0000 | ENEMA | Freq: Once | RECTAL | Status: AC | PRN

## 2014-02-24 MED ORDER — BUPIVACAINE LIPOSOME 1.3 % IJ SUSP
20.0000 mL | Freq: Once | INTRAMUSCULAR | Status: DC
  Filled 2014-02-24: qty 20

## 2014-02-24 MED ORDER — CEFAZOLIN SODIUM-DEXTROSE 2-3 GM-% IV SOLR
2.0000 g | INTRAVENOUS | Status: AC
  Administered 2014-02-24: 2 g via INTRAVENOUS

## 2014-02-24 MED ORDER — ONDANSETRON HCL 4 MG/2ML IJ SOLN: 4.0000 mg | Freq: Four times a day (QID) | INTRAMUSCULAR | Status: DC | PRN

## 2014-02-24 MED ORDER — HYDROMORPHONE HCL PF 2 MG/ML IJ SOLN
INTRAMUSCULAR | Status: AC
  Filled 2014-02-24: qty 1

## 2014-02-24 MED ORDER — PROMETHAZINE HCL 25 MG/ML IJ SOLN
6.2500 mg | INTRAMUSCULAR | Status: DC | PRN
Start: 2014-02-24 — End: 2014-02-24

## 2014-02-24 MED ORDER — PROPOFOL 10 MG/ML IV BOLUS
INTRAVENOUS | Status: AC
  Filled 2014-02-24: qty 20

## 2014-02-24 MED ORDER — METOCLOPRAMIDE HCL 5 MG/ML IJ SOLN: 5.0000 mg | Freq: Three times a day (TID) | INTRAMUSCULAR | Status: DC | PRN

## 2014-02-24 MED ORDER — DOCUSATE SODIUM 100 MG PO CAPS
100.0000 mg | ORAL_CAPSULE | Freq: Two times a day (BID) | ORAL | Status: DC
  Administered 2014-02-24 – 2014-02-26 (×4): 100 mg via ORAL

## 2014-02-24 MED ORDER — LAMOTRIGINE 100 MG PO TABS: 100.0000 mg | ORAL_TABLET | Freq: Two times a day (BID) | ORAL | Status: DC

## 2014-02-24 MED ORDER — FENTANYL CITRATE 0.05 MG/ML IJ SOLN
INTRAMUSCULAR | Status: AC
  Filled 2014-02-24: qty 2

## 2014-02-24 MED ORDER — FENTANYL CITRATE 0.05 MG/ML IJ SOLN
INTRAMUSCULAR | Status: DC | PRN
  Administered 2014-02-24: 25 ug via INTRAVENOUS
  Administered 2014-02-24: 50 ug via INTRAVENOUS
  Administered 2014-02-24: 25 ug via INTRAVENOUS

## 2014-02-24 MED ORDER — SODIUM CHLORIDE 0.9 % IR SOLN
Status: DC | PRN
  Administered 2014-02-24: 1000 mL

## 2014-02-24 MED ORDER — HYDROMORPHONE HCL 2 MG PO TABS
2.0000 mg | ORAL_TABLET | ORAL | Status: DC | PRN
  Administered 2014-02-24: 4 mg via ORAL

## 2014-02-24 MED ORDER — DEXAMETHASONE SODIUM PHOSPHATE 10 MG/ML IJ SOLN
10.0000 mg | Freq: Once | INTRAMUSCULAR | Status: DC
Start: 2014-02-24 — End: 2014-02-24

## 2014-02-24 MED ORDER — DEXAMETHASONE SODIUM PHOSPHATE 10 MG/ML IJ SOLN
INTRAMUSCULAR | Status: DC | PRN
  Administered 2014-02-24: 10 mg via INTRAVENOUS

## 2014-02-24 MED ORDER — DIPHENHYDRAMINE HCL 12.5 MG/5ML PO ELIX: 12.5000 mg | ORAL_SOLUTION | ORAL | Status: DC | PRN

## 2014-02-24 SURGICAL SUPPLY — 57 items
BAG ZIPLOCK 12X15 (MISCELLANEOUS) ×3 IMPLANT
BANDAGE ELASTIC 6 VELCRO ST LF (GAUZE/BANDAGES/DRESSINGS) ×3 IMPLANT
BANDAGE ESMARK 6X9 LF (GAUZE/BANDAGES/DRESSINGS) ×1 IMPLANT
BLADE SAG 18X100X1.27 (BLADE) ×3 IMPLANT
BLADE SAW SGTL 11.0X1.19X90.0M (BLADE) ×3 IMPLANT
BNDG ESMARK 6X9 LF (GAUZE/BANDAGES/DRESSINGS) ×3
BOWL SMART MIX CTS (DISPOSABLE) ×3 IMPLANT
CAPT RP KNEE ×3 IMPLANT
CEMENT HV SMART SET (Cement) ×6 IMPLANT
CLOSURE WOUND 1/2 X4 (GAUZE/BANDAGES/DRESSINGS) ×2
CUFF TOURN SGL QUICK 34 (TOURNIQUET CUFF) ×2
CUFF TRNQT CYL 34X4X40X1 (TOURNIQUET CUFF) ×1 IMPLANT
DECANTER SPIKE VIAL GLASS SM (MISCELLANEOUS) ×3 IMPLANT
DRAPE EXTREMITY T 121X128X90 (DRAPE) ×3 IMPLANT
DRAPE POUCH INSTRU U-SHP 10X18 (DRAPES) ×3 IMPLANT
DRAPE U-SHAPE 47X51 STRL (DRAPES) ×3 IMPLANT
DRSG ADAPTIC 3X8 NADH LF (GAUZE/BANDAGES/DRESSINGS) ×3 IMPLANT
DRSG PAD ABDOMINAL 8X10 ST (GAUZE/BANDAGES/DRESSINGS) ×3 IMPLANT
DURAPREP 26ML APPLICATOR (WOUND CARE) ×3 IMPLANT
ELECT REM PT RETURN 9FT ADLT (ELECTROSURGICAL) ×3
ELECTRODE REM PT RTRN 9FT ADLT (ELECTROSURGICAL) ×1 IMPLANT
EVACUATOR 1/8 PVC DRAIN (DRAIN) ×3 IMPLANT
FACESHIELD WRAPAROUND (MASK) ×15 IMPLANT
GLOVE BIO SURGEON STRL SZ7.5 (GLOVE) IMPLANT
GLOVE BIO SURGEON STRL SZ8 (GLOVE) ×3 IMPLANT
GLOVE BIOGEL PI IND STRL 8 (GLOVE) ×2 IMPLANT
GLOVE BIOGEL PI INDICATOR 8 (GLOVE) ×4
GLOVE SURG SS PI 6.5 STRL IVOR (GLOVE) IMPLANT
GOWN STRL REUS W/TWL LRG LVL3 (GOWN DISPOSABLE) ×3 IMPLANT
GOWN STRL REUS W/TWL XL LVL3 (GOWN DISPOSABLE) IMPLANT
HANDPIECE INTERPULSE COAX TIP (DISPOSABLE) ×2
IMMOBILIZER KNEE 20 (SOFTGOODS) ×6 IMPLANT
IMMOBILIZER KNEE 20 THIGH 36 (SOFTGOODS) ×1 IMPLANT
KIT BASIN OR (CUSTOM PROCEDURE TRAY) ×3 IMPLANT
MANIFOLD NEPTUNE II (INSTRUMENTS) ×3 IMPLANT
NDL SAFETY ECLIPSE 18X1.5 (NEEDLE) ×2 IMPLANT
NEEDLE HYPO 18GX1.5 SHARP (NEEDLE) ×4
NS IRRIG 1000ML POUR BTL (IV SOLUTION) ×3 IMPLANT
PACK TOTAL JOINT (CUSTOM PROCEDURE TRAY) ×3 IMPLANT
PADDING CAST COTTON 6X4 STRL (CAST SUPPLIES) ×6 IMPLANT
POSITIONER SURGICAL ARM (MISCELLANEOUS) ×3 IMPLANT
SET HNDPC FAN SPRY TIP SCT (DISPOSABLE) ×1 IMPLANT
SPONGE GAUZE 4X4 12PLY (GAUZE/BANDAGES/DRESSINGS) ×3 IMPLANT
STRIP CLOSURE SKIN 1/2X4 (GAUZE/BANDAGES/DRESSINGS) ×4 IMPLANT
SUCTION FRAZIER 12FR DISP (SUCTIONS) ×3 IMPLANT
SUT MNCRL AB 4-0 PS2 18 (SUTURE) ×3 IMPLANT
SUT VIC AB 2-0 CT1 27 (SUTURE) ×6
SUT VIC AB 2-0 CT1 TAPERPNT 27 (SUTURE) ×3 IMPLANT
SUT VLOC 180 0 24IN GS25 (SUTURE) ×3 IMPLANT
SYR 20CC LL (SYRINGE) ×3 IMPLANT
SYR 50ML LL SCALE MARK (SYRINGE) ×3 IMPLANT
TOWEL OR 17X26 10 PK STRL BLUE (TOWEL DISPOSABLE) ×3 IMPLANT
TOWEL OR NON WOVEN STRL DISP B (DISPOSABLE) IMPLANT
TRAY FOLEY CATH 14FRSI W/METER (CATHETERS) IMPLANT
TRAY FOLEY CATH 16FR SILVER (SET/KITS/TRAYS/PACK) ×3 IMPLANT
WATER STERILE IRR 1500ML POUR (IV SOLUTION) ×3 IMPLANT
WRAP KNEE MAXI GEL POST OP (GAUZE/BANDAGES/DRESSINGS) ×3 IMPLANT

## 2014-02-24 NOTE — Progress Notes (Signed)
Utilization review completed.  

## 2014-02-24 NOTE — Anesthesia Postprocedure Evaluation (Signed)
Anesthesia Post Note  Patient: Connor [name]  Procedure(s) Performed: Procedure(s) (LRB): RIGHT TOTAL KNEE ARTHROPLASTY (Right)  Anesthesia type: General  Patient location: PACU  Post pain: Pain level controlled  Post assessment: Post-op Vital signs reviewed  Last Vitals: BP 119/67  Pulse 83  Temp(Src) 36.7 C (Oral)  Resp 16  Ht 5\' 11"  (1.803 m)  Wt 206 lb (93.441 kg)  BMI 28.74 kg/m2  SpO2 95%  Post vital signs: Reviewed  Level of consciousness: sedated  Complications: No apparent anesthesia complications

## 2014-02-24 NOTE — Progress Notes (Signed)
Patient states he has been taking Amoxicillin at home since 02/19/14 for a tooth ache.  He called Dr. Anne Fu office and talked to someone before taking the antibiotic. He states his tooth feels better.

## 2014-02-24 NOTE — Evaluation (Signed)
Physical Therapy Evaluation Patient Details Name: Connor [name] MRN: [id number] DOB: 1946-09-29 Today's Date: 02/24/2014   History of Present Illness  R TKA, H/O stroke with prior history of imbalance.  Clinical Impression  Pt c/o dizziness walking. BP 94/62, after rest 118/76. RN aware./  Pt will benefit from PT to address the problems listed. Pt reports prior balance problems, Pt will HAVE to have close 24/7 caregivers.     Follow Up Recommendations  (Pt may benefit from ST rehab if does not have 24/7 caregivers.)    Equipment Recommendations  None recommended by PT    Recommendations for Other Services       Precautions / Restrictions Precautions Precautions: Fall;Knee Required Braces or Orthoses: Knee Immobilizer - Right      Mobility  Bed Mobility Overal bed mobility: Needs Assistance Bed Mobility: Supine to Sit     Supine to sit: Min assist     General bed mobility comments: cues for sequence and   technique  Transfers Overall transfer level: Needs assistance Equipment used: Rolling walker (2 wheeled) Transfers: Sit to/from Stand Sit to Stand: Mod assist         General transfer comment: from bed, cues for safety and hand/R leg position.  Ambulation/Gait Ambulation/Gait assistance: +2 safety/equipment;Min assist Ambulation Distance (Feet): 25 Feet Assistive device: Rolling walker (2 wheeled) Gait Pattern/deviations: Step-to pattern;Decreased step length - right     General Gait Details: gait unsteady , cues for sequence and step length  Stairs            Wheelchair Mobility    Modified Rankin (Stroke Patients Only)       Balance                                             Pertinent Vitals/Pain Pain is mild.    Home Living Family/patient expects to be discharged to:: Private residence Living Arrangements: Children Available Help at Discharge: Family Type of Home: House Home Access: Stairs to enter   Engineer, site of Steps: 1 Home Layout: One level Home Equipment: Environmental consultant - 2 wheels;Cane - single point      Prior Function Level of Independence: Independent with assistive device(s)               Hand Dominance        Extremity/Trunk Assessment   Upper Extremity Assessment: Defer to OT evaluation           Lower Extremity Assessment: RLE deficits/detail RLE Deficits / Details: pt able  to perform SLR    Cervical / Trunk Assessment: Normal  Communication   Communication: No difficulties  Cognition Arousal/Alertness: Awake/alert Behavior During Therapy: WFL for tasks assessed/performed Overall Cognitive Status: Within Functional Limits for tasks assessed                      General Comments      Exercises        Assessment/Plan    PT Assessment Patient needs continued PT services  PT Diagnosis Difficulty walking;Abnormality of gait;Acute pain   PT Problem List Decreased strength;Decreased range of motion;Decreased activity tolerance;Decreased balance;Decreased mobility;Decreased cognition;Decreased coordination;Decreased knowledge of use of DME;Decreased safety awareness;Decreased knowledge of precautions;Cardiopulmonary status limiting activity;Pain  PT Treatment Interventions DME instruction;Gait training;Stair training;Functional mobility training;Therapeutic activities;Therapeutic exercise;Patient/family education   PT Goals (Current goals can be found in  the Care Plan section) Acute Rehab PT Goals Patient Stated Goal: I want to walk, I have balance problems. PT Goal Formulation: With patient/family Time For Goal Achievement: 03/03/14 Potential to Achieve Goals: Good    Frequency 7X/week   Barriers to discharge        Co-evaluation               End of Session Equipment Utilized During Treatment: Gait belt;Right knee immobilizer Activity Tolerance: Treatment limited secondary to medical complications (Comment) Patient left: in  chair;with call bell/phone within reach Nurse Communication: Mobility status (BP drop)         Time: 4818-5631 PT Time Calculation (min): 28 min   Charges:   PT Evaluation $Initial PT Evaluation Tier I: 1 Procedure PT Treatments $Gait Training: 23-37 mins   PT G Codes:          Claretha Cooper 02/24/2014, 6:10 PM Tresa Endo PT [phone]

## 2014-02-24 NOTE — Interval H&P Note (Signed)
History and Physical Interval Note:  02/24/2014 8:42 AM  Connor [name]  has presented today for surgery, with the diagnosis of Osteoarthritis of the right knee  The various methods of treatment have been discussed with the patient and family. After consideration of risks, benefits and other options for treatment, the patient has consented to  Procedure(s): RIGHT TOTAL KNEE ARTHROPLASTY (Right) as a surgical intervention .  The patient's history has been reviewed, patient examined, no change in status, stable for surgery.  I have reviewed the patient's chart and labs.  Questions were answered to the patient's satisfaction.     Dione Plover Zeeshan Korte

## 2014-02-24 NOTE — Plan of Care (Signed)
Problem: Consults Goal: Diagnosis- Total Joint Replacement Right total knee     

## 2014-02-24 NOTE — Anesthesia Preprocedure Evaluation (Signed)
Anesthesia Evaluation  Patient identified by MRN, date of birth, ID band Patient awake    Reviewed: Allergy & Precautions, H&P , NPO status , Patient's Chart, lab work & pertinent test results  History of Anesthesia Complications (+) POST - OP SPINAL HEADACHE and history of anesthetic complications  Airway Mallampati: II TM Distance: >3 FB Neck ROM: Full    Dental  (+) Dental Advisory Given   Pulmonary neg pulmonary ROS,  breath sounds clear to auscultation        Cardiovascular hypertension, Pt. on medications + Peripheral Vascular Disease negative cardio ROS  Rhythm:Regular Rate:Normal     Neuro/Psych  Headaches, Seizures -, Well Controlled,  CVA negative psych ROS   GI/Hepatic negative GI ROS, Neg liver ROS,   Endo/Other  negative endocrine ROS  Renal/GU Renal diseasenegative Renal ROS     Musculoskeletal negative musculoskeletal ROS (+)   Abdominal   Peds  Hematology negative hematology ROS (+)   Anesthesia Other Findings   Reproductive/Obstetrics                           Anesthesia Physical Anesthesia Plan  ASA: III  Anesthesia Plan: General   Post-op Pain Management:    Induction: Intravenous  Airway Management Planned: Oral ETT  Additional Equipment:   Intra-op Plan:   Post-operative Plan: Extubation in OR  Informed Consent: I have reviewed the patients History and Physical, chart, labs and discussed the procedure including the risks, benefits and alternatives for the proposed anesthesia with the patient or authorized representative who has indicated his/her understanding and acceptance.   Dental advisory given  Plan Discussed with: CRNA  Anesthesia Plan Comments:         Anesthesia Quick Evaluation

## 2014-02-24 NOTE — Transfer of Care (Signed)
Immediate Anesthesia Transfer of Care Note  Patient: Connor [name]  Procedure(s) Performed: Procedure(s): RIGHT TOTAL KNEE ARTHROPLASTY (Right)  Patient Location: PACU  Anesthesia Type:General  Level of Consciousness: awake, alert , oriented and patient cooperative  Airway & Oxygen Therapy: Patient Spontanous Breathing and Patient connected to face mask oxygen  Post-op Assessment: Report given to PACU RN, Post -op Vital signs reviewed and stable and Patient moving all extremities  Post vital signs: Reviewed and stable  Complications: No apparent anesthesia complications

## 2014-02-24 NOTE — Op Note (Signed)
Pre-operative diagnosis- Osteoarthritis  Right knee(s)  Post-operative diagnosis- Osteoarthritis Right knee(s)  Procedure-  Right  Total Knee Arthroplasty  Surgeon- Connor Plover. Clois Treanor, MD  Assistant- Ardeen Jourdain, PA-C   Anesthesia-  General  EBL-* No blood loss amount entered *   Drains Hemovac  Tourniquet time-  Total Tourniquet Time Documented: Thigh (Right) - 38 minutes Total: Thigh (Right) - 38 minutes     Complications- None  Condition-PACU - hemodynamically stable.   Brief Clinical Note  Connor [name] is a 68 y.o. year old male with end stage OA of his right knee with progressively worsening pain and dysfunction. He has constant pain, with activity and at rest and significant functional deficits with difficulties even with ADLs. He has had extensive non-op management including analgesics, injections of cortisone, and home exercise program, but remains in significant pain with significant dysfunction. Radiographs show bone on bone arthritis medial and patellofemoral. He presents now for right Total Knee Arthroplasty.    Procedure in detail---   The patient is brought into the operating room and positioned supine on the operating table. After successful administration of  General,   a tourniquet is placed high on the  Right thigh(s) and the lower extremity is prepped and draped in the usual sterile fashion. Time out is performed by the operating team and then the  Right lower extremity is wrapped in Esmarch, knee flexed and the tourniquet inflated to 300 mmHg.       A midline incision is made with a ten blade through the subcutaneous tissue to the level of the extensor mechanism. A fresh blade is used to make a medial parapatellar arthrotomy. Soft tissue over the proximal medial tibia is subperiosteally elevated to the joint line with a knife and into the semimembranosus bursa with a Cobb elevator. Soft tissue over the proximal lateral tibia is elevated with attention being  paid to avoiding the patellar tendon on the tibial tubercle. The patella is everted, knee flexed 90 degrees and the ACL and PCL are removed. Findings are bone on bone medial and patellofemoral with large global osteophytes.        The drill is used to create a starting hole in the distal femur and the canal is thoroughly irrigated with sterile saline to remove the fatty contents. The 5 degree Right  valgus alignment guide is placed into the femoral canal and the distal femoral cutting block is pinned to remove 10 mm off the distal femur. Resection is made with an oscillating saw.      The tibia is subluxed forward and the menisci are removed. The extramedullary alignment guide is placed referencing proximally at the medial aspect of the tibial tubercle and distally along the second metatarsal axis and tibial crest. The block is pinned to remove 76mm off the more deficient medial  side. Resection is made with an oscillating saw. Size 5is the most appropriate size for the tibia and the proximal tibia is prepared with the modular drill and keel punch for that size.      The femoral sizing guide is placed and size 5 is most appropriate. Rotation is marked off the epicondylar axis and confirmed by creating a rectangular flexion gap at 90 degrees. The size 5 cutting block is pinned in this rotation and the anterior, posterior and chamfer cuts are made with the oscillating saw. The intercondylar block is then placed and that cut is made.      Trial size 5 tibial component, trial size  5 posterior stabilized femur and a 15  mm posterior stabilized rotating platform insert trial is placed. Full extension is achieved with excellent varus/valgus and anterior/posterior balance throughout full range of motion. The patella is everted and thickness measured to be 27  mm. Free hand resection is taken to 15 mm, a 41 template is placed, lug holes are drilled, trial patella is placed, and it tracks normally. Osteophytes are removed  off the posterior femur with the trial in place. All trials are removed and the cut bone surfaces prepared with pulsatile lavage. Cement is mixed and once ready for implantation, the size 5 tibial implant, size  5 posterior stabilized femoral component, and the size 41 patella are cemented in place and the patella is held with the clamp. The trial insert is placed and the knee held in full extension. The Exparel (20 ml mixed with 30 ml saline) and .25% Bupivicaine, are injected into the extensor mechanism, posterior capsule, medial and lateral gutters and subcutaneous tissues.  All extruded cement is removed and once the cement is hard the permanent 15 mm posterior stabilized rotating platform insert is placed into the tibial tray.      The wound is copiously irrigated with saline solution and the extensor mechanism closed over a hemovac drain with #1 V-loc suture. The tourniquet is released for a total tourniquet time of 38  minutes. Flexion against gravity is 140 degrees and the patella tracks normally. Subcutaneous tissue is closed with 2.0 vicryl and subcuticular with running 4.0 Monocryl. The incision is cleaned and dried and steri-strips and a bulky sterile dressing are applied. The limb is placed into a knee immobilizer and the patient is awakened and transported to recovery in stable condition.      Please note that a surgical assistant was a medical necessity for this procedure in order to perform it in a safe and expeditious manner. Surgical assistant was necessary to retract the ligaments and vital neurovascular structures to prevent injury to them and also necessary for proper positioning of the limb to allow for anatomic placement of the prosthesis.   Connor Plover Braylei Totino, MD    02/24/2014, 10:29 AM

## 2014-02-25 ENCOUNTER — Encounter (HOSPITAL_COMMUNITY): Payer: Self-pay | Admitting: Orthopedic Surgery

## 2014-02-25 LAB — BASIC METABOLIC PANEL
BUN: 18 mg/dL (ref 6–23)
CO2: 22 mEq/L (ref 19–32)
CREATININE: 1.18 mg/dL (ref 0.50–1.35)
Calcium: 8.1 mg/dL — ABNORMAL LOW (ref 8.4–10.5)
Chloride: 98 mEq/L (ref 96–112)
GFR, EST AFRICAN AMERICAN: 72 mL/min — AB (ref >90)
GFR, EST NON AFRICAN AMERICAN: 63 mL/min — AB (ref >90)
GLUCOSE: 166 mg/dL — AB (ref 70–99)
Potassium: 4.3 mEq/L (ref 3.7–5.3)
Sodium: 132 mEq/L — ABNORMAL LOW (ref 137–147)

## 2014-02-25 LAB — CBC
HEMATOCRIT: 27.2 % — AB (ref 39.0–52.0)
HEMOGLOBIN: 9.5 g/dL — AB (ref 13.0–17.0)
MCH: 30.2 pg (ref 26.0–34.0)
MCHC: 34.9 g/dL (ref 30.0–36.0)
MCV: 86.3 fL (ref 78.0–100.0)
Platelets: 215 10*3/uL (ref 150–400)
RBC: 3.15 MIL/uL — ABNORMAL LOW (ref 4.22–5.81)
RDW: 12.8 % (ref 11.5–15.5)
WBC: 14.1 10*3/uL — ABNORMAL HIGH (ref 4.0–10.5)

## 2014-02-25 MED ORDER — AMLODIPINE BESYLATE 5 MG PO TABS
5.0000 mg | ORAL_TABLET | Freq: Every day | ORAL | Status: DC
  Administered 2014-02-25 – 2014-02-26 (×2): 5 mg via ORAL
  Filled 2014-02-25 (×2): qty 1

## 2014-02-25 MED ORDER — IRBESARTAN 150 MG PO TABS
150.0000 mg | ORAL_TABLET | Freq: Every day | ORAL | Status: DC
  Administered 2014-02-25 – 2014-02-26 (×2): 150 mg via ORAL
  Filled 2014-02-25 (×2): qty 1

## 2014-02-25 MED ORDER — POLYSACCHARIDE IRON COMPLEX 150 MG PO CAPS
150.0000 mg | ORAL_CAPSULE | Freq: Every day | ORAL | Status: DC
  Administered 2014-02-25 – 2014-02-26 (×2): 150 mg via ORAL
  Filled 2014-02-25 (×2): qty 1

## 2014-02-25 NOTE — Evaluation (Signed)
Occupational Therapy Evaluation Patient Details Name: Connor [name] MRN: [id number] DOB: July 14, 1946 Today's Date: 02/25/2014    History of Present Illness R TKA, H/O stroke with prior history of imbalance.   Clinical Impression   This 69 year old man was admitted for the above surgery. He has a h/o CVA but was functioning at a mod I level.  He will benefit from skilled OT to increase safety and independence with adls. 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  Goals in acute are for overall min guard to min A level.      Follow Up Recommendations  Supervision/Assistance - 24 hour;Home health OT    Equipment Recommendations   (tba further for 3:1 for night use)    Recommendations for Other Services       Precautions / Restrictions Precautions Precautions: Fall;Knee Precaution Comments: Instructed pt on KI use for amb Required Braces or Orthoses: Knee Immobilizer - Right Restrictions Weight Bearing Restrictions: No Other Position/Activity Restrictions: WBAT      Mobility Bed Mobility              Transfers Overall transfer level: Needs assistance Equipment used: Rolling walker (2 wheeled) Transfers: Sit to/from Stand Sit to Stand: Min assist;Mod assist         General transfer comment: elevated bed and 25% VC's on proper tech and hand placement    Balance                                            ADL Overall ADL's : Needs assistance/impaired     Grooming: Min guard;Standing   Upper Body Bathing: Set up;Sitting   Lower Body Bathing: Moderate assistance;Sit to/from stand   Upper Body Dressing : Minimal assistance;Sitting (iv)   Lower Body Dressing: Moderate assistance;Sit to/from stand   Toilet Transfer: Minimal assistance;RW;Ambulation   Toileting- Clothing Manipulation and Hygiene: Minimal assistance;Sit to/from stand   Tub/ Shower Transfer: Walk-in shower;Minimal  assistance;Ambulation   Functional mobility during ADLs: Minimal assistance;Rolling walker General ADL Comments: Pt has a reacher somewhere.  He will have 24/7 for the first day--anticipate he may need more assistance than this due to h/o CVA. He has a Secondary school teacher, but is not sure where it is.  Reviewed uses for adls.       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  Pt states he can create a single image with time                   Perception     Praxis      Pertinent Vitals/Pain 1-2/10 R knee).  Repositioned and ice applied     Hand Dominance     Extremity/Trunk Assessment Upper Extremity Assessment Upper Extremity Assessment: Overall WFL for tasks assessed           Communication Communication Communication: No difficulties   Cognition Arousal/Alertness: Awake/alert Behavior During Therapy: WFL for tasks assessed/performed Overall Cognitive Status: History of cognitive impairments - at baseline (decreased memory)                     General Comments       Exercises       Shoulder Instructions      Home Living Family/patient expects to be discharged to:: Private residence Living Arrangements: Children Available Help at Discharge:  Family Type of Home: House Home Access: Stairs to enter [employer] of Steps: 1         Bathroom Shower/Tub: Occupational psychologist: Handicapped height     Home Equipment: Grab bars - toilet;Grab bars - tub/shower;Walker - 2 wheels;Cane - single point          Prior Functioning/Environment Level of Independence: Independent with assistive device(s)        Comments: h/o CVA and imbalance.  Has some diploplia but can merge to single image.  May not have 24/7    OT Diagnosis: Generalized weakness   OT Problem List: Decreased strength;Decreased activity tolerance;Decreased knowledge of use of DME or AE;Pain;Impaired balance (sitting and/or standing);Impaired vision/perception   OT  Treatment/Interventions: Self-care/ADL training;DME and/or AE instruction;Patient/family education;Balance training    OT Goals(Current goals can be found in the care plan section) Acute Rehab OT Goals Patient Stated Goal: be independent; get back to apt I built OT Goal Formulation: With patient Time For Goal Achievement: 03/04/14 Potential to Achieve Goals: Good ADL Goals Pt Will Perform Grooming: with supervision;standing Pt Will Perform Lower Body Bathing: with supervision;with adaptive equipment;sit to/from stand Pt Will Perform Lower Body Dressing: with min assist;with adaptive equipment;sit to/from stand Pt Will Transfer to Toilet: with min guard assist;ambulating;bedside commode Pt Will Perform Toileting - Clothing Manipulation and hygiene: with supervision;sit to/from stand Pt Will Perform Tub/Shower Transfer: with min guard assist;shower seat;rolling walker;Shower transfer  OT Frequency: Min 2X/week   Barriers to D/C:            Co-evaluation              End of Session    Activity Tolerance: Patient tolerated treatment well Patient left: in chair;with call bell/phone within reach;with family/visitor present   Time: 3267-1245 OT Time Calculation (min): 28 min Charges:  OT General Charges $OT Visit: 1 Procedure OT Evaluation $Initial OT Evaluation Tier I: 1 Procedure OT Treatments $Self Care/Home Management : 8-22 mins G-Codes:    Lesle Chris 03-03-14, 11:07 AM

## 2014-02-25 NOTE — Progress Notes (Signed)
Physical Therapy Treatment Patient Details Name: Connor [name] MRN: [id number] DOB: July 26, 1946 Today's Date: 02/25/2014    History of Present Illness R TKA, H/O stroke with prior history of imbalance.    PT Comments    POD # 1 pm session.  Assisted out of recliner to amb to BR then amb in hallway before assiting back to bed for CPM.    Follow Up Recommendations  Home health PT;Supervision/Assistance - 24 hour  HIGH FALL RISK will need initial family support   Equipment Recommendations       Recommendations for Other Services       Precautions / Restrictions Precautions Precautions: Fall;Knee Precaution Comments: Instructed pt on KI use for amb Required Braces or Orthoses: Knee Immobilizer - Right Restrictions Weight Bearing Restrictions: No Other Position/Activity Restrictions: WBAT    Mobility  Bed Mobility Overal bed mobility: Needs Assistance Bed Mobility: Sit to Supine       Sit to supine: Min assist   General bed mobility comments: Min Assist to support R LE up onto bed and increased time  Transfers Overall transfer level: Needs assistance Equipment used: Rolling walker (2 wheeled) Transfers: Sit to/from Stand Sit to Stand: Min assist;Mod assist         General transfer comment: slightly unsteady and required 25% VC's safety with tight turns in BR  Ambulation/Gait Ambulation/Gait assistance: Min assist Ambulation Distance (Feet): 68 Feet Assistive device: Rolling walker (2 wheeled) Gait Pattern/deviations: Step-to pattern Gait velocity: decreased   General Gait Details: gait unsteady , cues for sequence and step length   Stairs            Wheelchair Mobility    Modified Rankin (Stroke Patients Only)       Balance                                    Cognition                            Exercises      General Comments        Pertinent Vitals/Pain     Home Living                       Prior Function            PT Goals (current goals can now be found in the care plan section) Progress towards PT goals: Progressing toward goals    Frequency  7X/week    PT Plan      Co-evaluation             End of Session Equipment Utilized During Treatment: Gait belt;Right knee immobilizer Activity Tolerance: Patient limited by fatigue Patient left: in bed;with call bell/phone within reach     Time: 1340-1405 PT Time Calculation (min): 25 min  Charges:  $Gait Training: 8-22 mins $Therapeutic Activity: 8-22 mins                    G Codes:      Rica Koyanagi  PTA [facility]  Acute  Rehab Pager      [phone]

## 2014-02-25 NOTE — Progress Notes (Signed)
Physical Therapy Treatment Patient Details Name: Connor [name] MRN: [id number] DOB: 1946/07/07 Today's Date: 02/25/2014    History of Present Illness R TKA, H/O stroke with prior history of imbalance.    PT Comments    POD # 1 am session.  Instructed pt on KI use for amb.  Educated on proper application.  Assisted pt OOB to amb in hallway.  Still unsteady.  Required increased time and chair to follow for safety.  Positioned in recliner for AP and knee presses.  Applied ICE.  Follow Up Recommendations        Equipment Recommendations  None recommended by PT    Recommendations for Other Services       Precautions / Restrictions Precautions Precautions: Fall;Knee Precaution Comments: Instructed pt on KI use for amb Required Braces or Orthoses: Knee Immobilizer - Right Restrictions Weight Bearing Restrictions: No Other Position/Activity Restrictions: WBAT    Mobility  Bed Mobility Overal bed mobility: Needs Assistance Bed Mobility: Supine to Sit     Supine to sit: Min assist     General bed mobility comments: cues for sequence and   technique plus Min Assist to guide R LE off bed  Transfers Overall transfer level: Needs assistance Equipment used: Rolling walker (2 wheeled) Transfers: Sit to/from Stand Sit to Stand: Min assist;Mod assist         General transfer comment: elevated bed and 25% VC's on proper tech and hand placement  Ambulation/Gait Ambulation/Gait assistance: Min assist Ambulation Distance (Feet): 55 Feet Assistive device: Rolling walker (2 wheeled) Gait Pattern/deviations: Step-to pattern;Decreased stance time - right Gait velocity: decreased   General Gait Details: gait unsteady , cues for sequence and step length   Stairs            Wheelchair Mobility    Modified Rankin (Stroke Patients Only)       Balance                                    Cognition                            Exercises      General Comments        Pertinent Vitals/Pain C/o "soreness"    Home Living                      Prior Function            PT Goals (current goals can now be found in the care plan section) Progress towards PT goals: Progressing toward goals    Frequency  7X/week    PT Plan      Co-evaluation             End of Session Equipment Utilized During Treatment: Gait belt;Right knee immobilizer Activity Tolerance: Patient limited by fatigue Patient left: in chair;with call bell/phone within reach     Time: 0935-1000 PT Time Calculation (min): 25 min  Charges:  $Gait Training: 8-22 mins $Therapeutic Exercise: 8-22 mins                    G Codes:      Rica Koyanagi  PTA [facility]  Acute  Rehab Pager      [phone]

## 2014-02-25 NOTE — Progress Notes (Signed)
   Subjective: 1 Day Post-Op Procedure(s) (LRB): RIGHT TOTAL KNEE ARTHROPLASTY (Right) Patient reports pain as mild.   Patient seen in rounds with Dr. Wynelle Link. Patient is well, but has had some minor complaints of pain in the knee, requiring pain medications We will start therapy today.  Plan is to go Home after hospital stay.  Objective: Vital signs in last 24 hours: Temp:  [97 F (36.1 C)-98.1 F (36.7 C)] 97.9 F (36.6 C) (05/05 0539) Pulse Rate:  [76-96] 76 (05/05 0539) Resp:  [11-16] 16 (05/05 0539) BP: (98-153)/(56-81) 110/64 mmHg (05/05 0539) SpO2:  [95 %-100 %] 99 % (05/05 0539) FiO2 (%):  [2 %] 2 % (05/04 1215) Weight:  [93.441 kg (206 lb)] 93.441 kg (206 lb) (05/04 1215)  Intake/Output from previous day:  Intake/Output Summary (Last 24 hours) at 02/25/14 0738 Last data filed at 02/25/14 0646  Gross per 24 hour  Intake 4438.67 ml  Output   2190 ml  Net 2248.67 ml    Intake/Output this shift:    Labs:  Recent Labs  02/25/14 0435  HGB 9.5*    Recent Labs  02/25/14 0435  WBC 14.1*  RBC 3.15*  HCT 27.2*  PLT 215    Recent Labs  02/25/14 0435  NA 132*  K 4.3  CL 98  CO2 22  BUN 18  CREATININE 1.18  GLUCOSE 166*  CALCIUM 8.1*   No results found for this basename: LABPT, INR,  in the last 72 hours  EXAM General - Patient is Alert, Appropriate and Oriented Extremity - Neurovascular intact Sensation intact distally Dorsiflexion/Plantar flexion intact Dressing - dressing C/D/I Motor Function - intact, moving foot and toes well on exam.  Hemovac pulled without difficulty.  Past Medical History  Diagnosis Date  . Head and neck cancer ~ 2009    S/P radiation & chem  . Hypertension   . Chronic kidney disease   . Hyperlipidemia   . Stroke DEC 2013    UNABLE TO SPEAK OR MOVE AND RT SIDE WEAKNESS AND LOSS OF SKIN SENSITIVITY TO HEAT AND COLD ON RT SIDE, DOUBLE VISION. BALANCE PROBLEMS---STATES STILL HAS DOUBLE VISION AND BALANCE PROBLEM AND  RT SIDED LOSS OF SKIN SENSITIVITY  . Cancer     h/o neck - ABOUT 6 YRS AGO - TX'D WITH RADIATION AND CHEMO   . Kidney carcinoma     h/o - NEPHRECTOMY   . Seizures     "the bad kind;bite tongue; STATES LAST [place] 2013; WAS SEEING [name] - HE RETIRED AND PT LAST SAW DR. [name]  . Headache(784.0)   . Arthritis     OA AND PAIN RT KNEE    Assessment/Plan: 1 Day Post-Op Procedure(s) (LRB): RIGHT TOTAL KNEE ARTHROPLASTY (Right) Principal Problem:   OA (osteoarthritis) of knee  Estimated body mass index is 28.74 kg/(m^2) as calculated from the following:   Height as of this encounter: 5\' 11"  (1.803 m).   Weight as of this encounter: 93.441 kg (206 lb). Advance diet Up with therapy Plan for discharge tomorrow Discharge home with home health  DVT Prophylaxis - Xarelto Weight-Bearing as tolerated to right leg D/C O2 and Pulse OX and try on Room Air  Arlee Muslim, PA-C Orthopaedic Surgery 02/25/2014, 7:38 AM

## 2014-02-25 NOTE — Care Management Note (Signed)
    Page 1 of 1   02/25/2014     1:52:06 PM CARE MANAGEMENT NOTE 02/25/2014  Patient:  Connor [name], Connor [name]   Account Number:  [id number]  Date Initiated:  02/25/2014  Documentation initiated by:  [facility]  Subjective/Objective Assessment:   adm: R knee pain/Osteoarthritis; R TOTAL KNEE ARTHROPLASTY(LRB)     Action/Plan:   Discharge planning   Anticipated DC Date:  02/26/2014   Anticipated DC Plan:  [place]  CM consult      [facility] Choice  HOME HEALTH   Choice offered to / List presented to:  C-1 Patient        [place] arranged  [place]   Status of service:  Completed, signed off Medicare Important Message given?   (If response is "NO", the following Medicare IM given date fields will be blank) Date Medicare IM given:  02/25/2014 Date Additional Medicare IM given:    Discharge Disposition:  [place]  Per UR Regulation:    If discussed at Long Length of Stay Meetings, dates discussed:    Comments:  02/25/14 12:00 CM met with pt in room to confirm [facility] services choice.  Arville Go will render HHPT/OT services.  Address and contact information verified with pt.  Though OT recc. 3n1, pt states he does not need this nor does he need any other DME. Pt states he has 24 supervision and feels safe going home.  IM given and explained to pt at 13:40. No other CM needs were communicated.  Mariane Masters, BSN, CM [phone].

## 2014-02-26 DIAGNOSIS — D62 Acute posthemorrhagic anemia: Secondary | ICD-10-CM

## 2014-02-26 DIAGNOSIS — E871 Hypo-osmolality and hyponatremia: Secondary | ICD-10-CM

## 2014-02-26 LAB — BASIC METABOLIC PANEL
BUN: 16 mg/dL (ref 6–23)
CALCIUM: 8.5 mg/dL (ref 8.4–10.5)
CO2: 25 meq/L (ref 19–32)
Chloride: 96 mEq/L (ref 96–112)
Creatinine, Ser: 1.03 mg/dL (ref 0.50–1.35)
GFR calc Af Amer: 85 mL/min — ABNORMAL LOW (ref >90)
GFR calc non Af Amer: 74 mL/min — ABNORMAL LOW (ref >90)
GLUCOSE: 144 mg/dL — AB (ref 70–99)
POTASSIUM: 4.2 meq/L (ref 3.7–5.3)
Sodium: 130 mEq/L — ABNORMAL LOW (ref 137–147)

## 2014-02-26 LAB — CBC
HCT: 24.1 % — ABNORMAL LOW (ref 39.0–52.0)
Hemoglobin: 8.3 g/dL — ABNORMAL LOW (ref 13.0–17.0)
MCH: 30.1 pg (ref 26.0–34.0)
MCHC: 34.4 g/dL (ref 30.0–36.0)
MCV: 87.3 fL (ref 78.0–100.0)
Platelets: 196 10*3/uL (ref 150–400)
RBC: 2.76 MIL/uL — ABNORMAL LOW (ref 4.22–5.81)
RDW: 13.1 % (ref 11.5–15.5)
WBC: 15.9 10*3/uL — ABNORMAL HIGH (ref 4.0–10.5)

## 2014-02-26 MED ORDER — METHOCARBAMOL 500 MG PO TABS: 500.0000 mg | ORAL_TABLET | Freq: Four times a day (QID) | ORAL | Status: DC | PRN

## 2014-02-26 MED ORDER — TRAMADOL HCL 50 MG PO TABS: 50.0000 mg | ORAL_TABLET | Freq: Four times a day (QID) | ORAL | Status: DC | PRN

## 2014-02-26 MED ORDER — HYDROMORPHONE HCL 2 MG PO TABS: 2.0000 mg | ORAL_TABLET | ORAL | Status: DC | PRN

## 2014-02-26 MED ORDER — POLYSACCHARIDE IRON COMPLEX 150 MG PO CAPS: 150.0000 mg | ORAL_CAPSULE | Freq: Two times a day (BID) | ORAL | Status: DC

## 2014-02-26 MED ORDER — RIVAROXABAN 10 MG PO TABS: 10.0000 mg | ORAL_TABLET | Freq: Every day | ORAL | Status: DC

## 2014-02-26 NOTE — Progress Notes (Signed)
Utilization review completed.  

## 2014-02-26 NOTE — Discharge Instructions (Addendum)
° °Dr. Frank Aluisio °Total Joint Specialist °[facility] °[address] °[place], [place] [address] °[phone] ° °TOTAL KNEE REPLACEMENT POSTOPERATIVE DIRECTIONS ° ° ° °Knee Rehabilitation, Guidelines Following Surgery  °Results after knee surgery are often greatly improved when you follow the exercise, range of motion and muscle strengthening exercises prescribed by your doctor. Safety measures are also important to protect the knee from further injury. Any time any of these exercises cause you to have increased pain or swelling in your knee joint, decrease the amount until you are comfortable again and slowly increase them. If you have problems or questions, call your caregiver or physical therapist for advice.  ° °HOME CARE INSTRUCTIONS  °Remove items at home which could result in a fall. This includes throw rugs or furniture in walking pathways.  °Continue medications as instructed at time of discharge. °You may have some home medications which will be placed on hold until you complete the course of blood thinner medication.  °You may start showering once you are discharged home but do not submerge the incision under water. Just pat the incision dry and apply a dry gauze dressing on daily. °Walk with walker as instructed.  °You may resume a sexual relationship in one month or when given the OK by  your doctor.  °· Use walker as long as suggested by your caregivers. °· Avoid periods of inactivity such as sitting longer than an hour when not asleep. This helps prevent blood clots.  °You may put full weight on your legs and walk as much as is comfortable.  °You may return to work once you are cleared by your doctor.  °Do not drive a car for 6 weeks or until released by you surgeon.  °· Do not drive while taking narcotics.  °Wear the elastic stockings for three weeks following surgery during the day but you may remove then at night. °Make sure you keep all of your appointments after your  operation with all of your doctors and caregivers. You should call the office at the above phone number and make an appointment for approximately two weeks after the date of your surgery. °Change the dressing daily and reapply a dry dressing each time. °Please pick up a stool softener and laxative for home use as long as you are requiring pain medications. °· Continue to use ice on the knee for pain and swelling from surgery. You may notice swelling that will progress down to the foot and ankle.  This is normal after surgery.  Elevate the leg when you are not up walking on it.   °It is important for you to complete the blood thinner medication as prescribed by your doctor. °· Continue to use the breathing machine which will help keep your temperature down.  It is common for your temperature to cycle up and down following surgery, especially at night when you are not up moving around and exerting yourself.  The breathing machine keeps your lungs expanded and your temperature down. ° °RANGE OF MOTION AND STRENGTHENING EXERCISES  °Rehabilitation of the knee is important following a knee injury or an operation. After just a few days of immobilization, the muscles of the thigh which control the knee become weakened and shrink (atrophy). Knee exercises are designed to build up the tone and strength of the thigh muscles and to improve knee motion. Often times heat used for twenty to thirty minutes before working out will loosen up your tissues and help with improving the   range of motion but do not use heat for the first two weeks following surgery. These exercises can be done on a training (exercise) mat, on the floor, on a table or on a bed. Use what ever works the best and is most comfortable for you Knee exercises include:  Leg Lifts - While your knee is still immobilized in a splint or cast, you can do straight leg raises. Lift the leg to 60 degrees, hold for 3 sec, and slowly lower the leg. Repeat 10-20 times 2-3  times daily. Perform this exercise against resistance later as your knee gets better.  Quad and Hamstring Sets - Tighten up the muscle on the front of the thigh (Quad) and hold for 5-10 sec. Repeat this 10-20 times hourly. Hamstring sets are done by pushing the foot backward against an object and holding for 5-10 sec. Repeat as with quad sets.  A rehabilitation program following serious knee injuries can speed recovery and prevent re-injury in the future due to weakened muscles. Contact your doctor or a physical therapist for more information on knee rehabilitation.   SKILLED REHAB INSTRUCTIONS: If the patient is transferred to a skilled rehab facility following release from the hospital, a list of the current medications will be sent to the facility for the patient to continue.  When discharged from the skilled rehab facility, please have the facility set up the patient's [place] prior to being released. Also, the skilled facility will be responsible for providing the patient with their medications at time of release from the facility to include their pain medication, the muscle relaxants, and their blood thinner medication. If the patient is still at the rehab facility at time of the two week follow up appointment, the skilled rehab facility will also need to assist the patient in arranging follow up appointment in our office and any transportation needs.  MAKE SURE YOU:  Understand these instructions.  Will watch your condition.  Will get help right away if you are not doing well or get worse.    Pick up stool softner and laxative for home. Do not submerge incision under water. May shower. Continue to use ice for pain and swelling from surgery.   Take Xarelto for two and a half more weeks, then discontinue Xarelto. Once the patient has completed the Xarelto, they may resume the 81 mg Aspirin.  Labs to be drawn by [facility] on Friday May 8th - CBC and a BMET - Send results to  Dr. Lyndle Herrlich [phone]   Information on my medicine - XARELTO (Rivaroxaban)  This medication education was reviewed with me or my healthcare representative as part of my discharge preparation.  The pharmacist that spoke with me during my hospital stay was:  Dara Hoyer, [facility]  Why was Xarelto prescribed for you? Xarelto was prescribed for you to reduce the risk of blood clots forming after orthopedic surgery. The medical term for these abnormal blood clots is venous thromboembolism (VTE).  What do you need to know about xarelto ? Take your Xarelto ONCE DAILY at the same time every day. You may take it either with or without food.  If you have difficulty swallowing the tablet whole, you may crush it and mix in applesauce just prior to taking your dose.  Take Xarelto exactly as prescribed by your doctor and DO NOT stop taking Xarelto without talking to the doctor who prescribed the medication.  Stopping without other VTE prevention medication to take the place  of Xarelto may increase your risk of developing a clot.  After discharge, you should have regular check-up appointments with your healthcare provider that is prescribing your Xarelto.    What do you do if you miss a dose? If you miss a dose, take it as soon as you remember on the same day then continue your regularly scheduled once daily regimen the next day. Do not take two doses of Xarelto on the same day.   Important Safety Information A possible side effect of Xarelto is bleeding. You should call your healthcare provider right away if you experience any of the following:   Bleeding from an injury or your nose that does not stop.   Unusual colored urine (red or dark brown) or unusual colored stools (red or black).   Unusual bruising for unknown reasons.   A serious fall or if you hit your head (even if there is no bleeding).  Some medicines may interact with Xarelto and might increase your risk of bleeding  while on Xarelto. To help avoid this, consult your healthcare provider or pharmacist prior to using any new prescription or non-prescription medications, including herbals, vitamins, non-steroidal anti-inflammatory drugs (NSAIDs) and supplements.  This website has more information on Xarelto: [email or web address]

## 2014-02-26 NOTE — Progress Notes (Signed)
Subjective: 2 Days Post-Op Procedure(s) (LRB): RIGHT TOTAL KNEE ARTHROPLASTY (Right) Patient reports pain as mild.   Patient seen in rounds with Dr. Wynelle Link.  Doing well this morning.   Patient is well, and has had no acute complaints or problems Patient is ready to go home today. HGB is lower but the patient is asymptomatic today.  Will recheck labs on Friday with [facility] and send results to the office.  Objective: Vital signs in last 24 hours: Temp:  [97.5 F (36.4 C)-98.8 F (37.1 C)] 98.8 F (37.1 C) (05/06 0431) Pulse Rate:  [73-84] 78 (05/06 0431) Resp:  [16-20] 16 (05/06 0758) BP: (113-138)/(67-68) 122/68 mmHg (05/06 0431) SpO2:  [96 %-99 %] 97 % (05/06 0431)  Intake/Output from previous day:  Intake/Output Summary (Last 24 hours) at 02/26/14 0915 Last data filed at 02/26/14 9371  Gross per 24 hour  Intake 455.17 ml  Output   1975 ml  Net -1519.83 ml    Intake/Output this shift:    Labs:  Recent Labs  02/25/14 0435 02/26/14 0512  HGB 9.5* 8.3*    Recent Labs  02/25/14 0435 02/26/14 0512  WBC 14.1* 15.9*  RBC 3.15* 2.76*  HCT 27.2* 24.1*  PLT 215 196    Recent Labs  02/25/14 0435 02/26/14 0512  NA 132* 130*  K 4.3 4.2  CL 98 96  CO2 22 25  BUN 18 16  CREATININE 1.18 1.03  GLUCOSE 166* 144*  CALCIUM 8.1* 8.5   No results found for this basename: LABPT, INR,  in the last 72 hours  EXAM: General - Patient is Alert, Appropriate and Oriented Extremity - Neurovascular intact Sensation intact distally Dorsiflexion/Plantar flexion intact Incision - clean, dry, no drainage, healing Motor Function - intact, moving foot and toes well on exam.   Assessment/Plan: 2 Days Post-Op Procedure(s) (LRB): RIGHT TOTAL KNEE ARTHROPLASTY (Right) Procedure(s) (LRB): RIGHT TOTAL KNEE ARTHROPLASTY (Right) Past Medical History  Diagnosis Date  . Head and neck cancer ~ 2009    S/P radiation & chem  . Hypertension   . Chronic kidney disease   .  Hyperlipidemia   . Stroke DEC 2013    UNABLE TO SPEAK OR MOVE AND RT SIDE WEAKNESS AND LOSS OF SKIN SENSITIVITY TO HEAT AND COLD ON RT SIDE, DOUBLE VISION. BALANCE PROBLEMS---STATES STILL HAS DOUBLE VISION AND BALANCE PROBLEM AND RT SIDED LOSS OF SKIN SENSITIVITY  . Cancer     h/o neck - ABOUT 6 YRS AGO - TX'D WITH RADIATION AND CHEMO   . Kidney carcinoma     h/o - NEPHRECTOMY   . Seizures     "the bad kind;bite tongue; STATES LAST [place] 2013; WAS SEEING [name] - HE RETIRED AND PT LAST SAW DR. [name]  . Headache(784.0)   . Arthritis     OA AND PAIN RT KNEE   Principal Problem:   OA (osteoarthritis) of knee Active Problems:   Hyponatremia   Postoperative anemia due to acute blood loss  Estimated body mass index is 28.74 kg/(m^2) as calculated from the following:   Height as of this encounter: 5\' 11"  (1.803 m).   Weight as of this encounter: 93.441 kg (206 lb). Up with therapy Discharge home with home health Diet - Cardiac diet Follow up - in 2 weeks Activity - WBAT Disposition - Home Condition Upon Discharge - Stable D/C Meds - See DC Summary DVT Prophylaxis - [place], PA-C Orthopaedic Surgery 02/26/2014, 9:15 AM

## 2014-02-26 NOTE — Progress Notes (Signed)
Occupational Therapy Treatment Patient Details Name: Connor [name] MRN: [id number] DOB: 1946/10/02 Today's Date: 02/26/2014    History of present illness R TKA, H/O stroke with prior history of imbalance.   OT comments  Pt making progress with OT.  No LOB during adls.  Pt has a high commode at home--will not need 3:1; will have 24/7  Follow Up Recommendations  Supervision/Assistance - 24 hour;Home health OT    Equipment Recommendations  None recommended by OT    Recommendations for Other Services      Precautions / Restrictions Precautions Precautions: Fall;Knee Precaution Comments: Instructed pt on KI use for amb Required Braces or Orthoses: Knee Immobilizer - Right Restrictions Other Position/Activity Restrictions: WBAT       Mobility Bed Mobility         Supine to sit: Min assist     General bed mobility comments: Min Assist to support R LE up onto bed and increased time  Transfers     Transfers: Sit to/from Stand Sit to Stand: Min guard;From elevated surface         General transfer comment: cues for LE position    Balance                                   ADL       Grooming: Oral care;Supervision/safety;Standing       Lower Body Bathing: Minimal assistance;Sit to/from stand       Lower Body Dressing: Moderate assistance;Sit to/from stand               Functional mobility during ADLs: Minimal assistance;Rolling walker General ADL Comments: performed adl from eob and stood for teeth      Vision                     Perception     Praxis      Cognition   Behavior During Therapy: [facility] for tasks assessed/performed Overall Cognitive Status: History of cognitive impairments - at baseline (memory)                       Extremity/Trunk Assessment               Exercises     Shoulder Instructions       General Comments      Pertinent Vitals/ Pain       No pain reported  Home Living                                           Prior Functioning/Environment              Frequency Min 2X/week     Progress Toward Goals  OT Goals(current goals can now be found in the care plan section)  Progress towards OT goals: Progressing toward goals     Plan      Co-evaluation                 End of Session     Activity Tolerance Patient tolerated treatment well   Patient Left in chair;with call bell/phone within reach;with family/visitor present   Nurse Communication          Time: 4132-4401 OT Time Calculation (min): 29 min  Charges: OT General Charges $OT  Visit: 1 Procedure OT Treatments $Self Care/Home Management : 23-37 mins  [facility] 02/26/2014, 9:34 AM  Lesle Chris, OTR/L [phone] 02/26/2014

## 2014-02-26 NOTE — Progress Notes (Signed)
RN reviewed discharge instructions with patient and family. All questions answered. Patient stated understanding of VTE med therapy, pain management, proper positioning, home health PT, and RN to draw labs on the 8th of this month.  Paperwork and prescriptions given.  NT rolled patient down in wheelchair to family car.

## 2014-02-26 NOTE — Progress Notes (Signed)
Physical Therapy Treatment Patient Details Name: Connor [name] MRN: [id number] DOB: 07/15/1946 Today's Date: 02/26/2014    History of Present Illness R TKA, H/O stroke with prior history of imbalance.    PT Comments    POD # 2 pt ready for D/C to home.  Applied KI as pt was unable to perform active SLR.  Amb in hallway and practiced negociating one step with RW twice.  Given handout HEP and instructed on freq.  Instructed on use of ICE.  Follow Up Recommendations  Home health PT;Supervision/Assistance - 24 hour     Equipment Recommendations  None recommended by PT    Recommendations for Other Services       Precautions / Restrictions Precautions Precautions: Fall;Knee Precaution Comments: Instructed pt on KI use for amb Required Braces or Orthoses: Knee Immobilizer - Right Restrictions Weight Bearing Restrictions: No Other Position/Activity Restrictions: WBAT    Mobility  Bed Mobility               General bed mobility comments: Pt OOB in recliner  Transfers                    Ambulation/Gait   Ambulation Distance (Feet): 53 Feet Assistive device: Rolling walker (2 wheeled)       General Gait Details: no c/o dizziness,  NO LOB,  mild c/o fatigue   Stairs Stairs: Yes Stairs assistance: Min guard Stair Management: No rails;Step to pattern;Forwards;With walker Number of Stairs: 1 (performed twice)    Wheelchair Mobility    Modified Rankin (Stroke Patients Only)       Balance                                    Cognition                            Exercises      General Comments        Pertinent Vitals/Pain C/o swelling    Home Living                      Prior Function            PT Goals (current goals can now be found in the care plan section) Progress towards PT goals: Progressing toward goals    Frequency  7X/week    PT Plan      Co-evaluation             End of  Session Equipment Utilized During Treatment: Gait belt;Right knee immobilizer Activity Tolerance: Patient tolerated treatment well Patient left: in chair;with call bell/phone within reach;with family/visitor present     Time: 1000-1028 PT Time Calculation (min): 28 min  Charges:  $Gait Training: 8-22 mins $Self Care/Home Management: 8-22                    G Codes:      Connor [name]  PTA [facility]  Acute  Rehab Pager      [phone]

## 2014-02-26 NOTE — Discharge Summary (Signed)
Physician Discharge Summary   Patient ID: Connor [name] MRN: [id number] DOB/AGE: 66/11/1945 66 y.o.  Admit date: 02/24/2014 Discharge date: 02-26-2014  Primary Diagnosis:  Osteoarthritis Right knee(s)  Admission Diagnoses:  Past Medical History  Diagnosis Date  . Head and neck cancer ~ 2009    S/P radiation & chem  . Hypertension   . Chronic kidney disease   . Hyperlipidemia   . Stroke DEC 2013    UNABLE TO SPEAK OR MOVE AND RT SIDE WEAKNESS AND LOSS OF SKIN SENSITIVITY TO HEAT AND COLD ON RT SIDE, DOUBLE VISION. BALANCE PROBLEMS---STATES STILL HAS DOUBLE VISION AND BALANCE PROBLEM AND RT SIDED LOSS OF SKIN SENSITIVITY  . Cancer     h/o neck - ABOUT 6 YRS AGO - TX'D WITH RADIATION AND CHEMO   . Kidney carcinoma     h/o - NEPHRECTOMY   . Seizures     "the bad kind;bite tongue; STATES LAST [place] 2013; WAS SEEING [name] - HE RETIRED AND PT LAST SAW DR. [name]  . Headache(784.0)   . Arthritis     OA AND PAIN RT KNEE   Discharge Diagnoses:   Principal Problem:   OA (osteoarthritis) of knee Active Problems:   Hyponatremia   Postoperative anemia due to acute blood loss  Estimated body mass index is 28.74 kg/(m^2) as calculated from the following:   Height as of this encounter: _0  (1.803 m).   Weight as of this encounter: 93.441 kg (206 lb).  Procedure:  Procedure(s) (LRB): RIGHT TOTAL KNEE ARTHROPLASTY (Right)   Consults: None  HPI: Connor [name] is a 68 y.o. year old male with end stage OA of his right knee with progressively worsening pain and dysfunction. He has constant pain, with activity and at rest and significant functional deficits with difficulties even with ADLs. He has had extensive non-op management including analgesics, injections of cortisone, and home exercise program, but remains in significant pain with significant dysfunction. Radiographs show bone on bone arthritis medial and patellofemoral. He presents now for right  Total Knee Arthroplasty.   Laboratory Data: Admission on 02/24/2014  Component Date Value Ref Range Status  . WBC 02/25/2014 14.1* 4.0 - 10.5 K/uL Final  . RBC 02/25/2014 3.15* 4.22 - 5.81 MIL/uL Final  . Hemoglobin 02/25/2014 9.5* 13.0 - 17.0 g/dL Final  . HCT 02/25/2014 27.2* 39.0 - 52.0 % Final  . MCV 02/25/2014 86.3  78.0 - 100.0 fL Final  . MCH 02/25/2014 30.2  26.0 - 34.0 pg Final  . MCHC 02/25/2014 34.9  30.0 - 36.0 g/dL Final  . RDW 02/25/2014 12.8  11.5 - 15.5 % Final  . Platelets 02/25/2014 215  150 - 400 K/uL Final  . Sodium 02/25/2014 132* 137 - 147 mEq/L Final  . Potassium 02/25/2014 4.3  3.7 - 5.3 mEq/L Final  . Chloride 02/25/2014 98  96 - 112 mEq/L Final  . CO2 02/25/2014 22  19 - 32 mEq/L Final  . Glucose, Bld 02/25/2014 166* 70 - 99 mg/dL Final  . BUN 02/25/2014 18  6 - 23 mg/dL Final  . Creatinine, Ser 02/25/2014 1.18  0.50 - 1.35 mg/dL Final  . Calcium 02/25/2014 8.1* 8.4 - 10.5 mg/dL Final  . GFR calc non Af Amer 02/25/2014 63* >90 mL/min Final  . GFR calc Af Amer 02/25/2014 72* >90 mL/min Final   Comment: (NOTE)  The eGFR has been calculated using the CKD EPI equation.                          This calculation has not been validated in all clinical situations.                          eGFR's persistently <90 mL/min signify possible Chronic Kidney                          Disease.  . WBC 02/26/2014 15.9* 4.0 - 10.5 K/uL Final  . RBC 02/26/2014 2.76* 4.22 - 5.81 MIL/uL Final  . Hemoglobin 02/26/2014 8.3* 13.0 - 17.0 g/dL Final  . HCT 02/26/2014 24.1* 39.0 - 52.0 % Final  . MCV 02/26/2014 87.3  78.0 - 100.0 fL Final  . MCH 02/26/2014 30.1  26.0 - 34.0 pg Final  . MCHC 02/26/2014 34.4  30.0 - 36.0 g/dL Final  . RDW 02/26/2014 13.1  11.5 - 15.5 % Final  . Platelets 02/26/2014 196  150 - 400 K/uL Final  . Sodium 02/26/2014 130* 137 - 147 mEq/L Final  . Potassium 02/26/2014 4.2  3.7 - 5.3 mEq/L Final  . Chloride 02/26/2014 96  96 - 112  mEq/L Final  . CO2 02/26/2014 25  19 - 32 mEq/L Final  . Glucose, Bld 02/26/2014 144* 70 - 99 mg/dL Final  . BUN 02/26/2014 16  6 - 23 mg/dL Final  . Creatinine, Ser 02/26/2014 1.03  0.50 - 1.35 mg/dL Final  . Calcium 02/26/2014 8.5  8.4 - 10.5 mg/dL Final  . GFR calc non Af Amer 02/26/2014 74* >90 mL/min Final  . GFR calc Af Amer 02/26/2014 85* >90 mL/min Final   Comment: (NOTE)                          The eGFR has been calculated using the CKD EPI equation.                          This calculation has not been validated in all clinical situations.                          eGFR's persistently <90 mL/min signify possible Chronic Kidney                          Disease.  Hospital Outpatient Visit on 02/17/2014  Component Date Value Ref Range Status  . aPTT 02/17/2014 33  24 - 37 seconds Final  . WBC 02/17/2014 6.7  4.0 - 10.5 K/uL Final  . RBC 02/17/2014 4.90  4.22 - 5.81 MIL/uL Final  . Hemoglobin 02/17/2014 14.8  13.0 - 17.0 g/dL Final  . HCT 02/17/2014 42.7  39.0 - 52.0 % Final  . MCV 02/17/2014 87.1  78.0 - 100.0 fL Final  . MCH 02/17/2014 30.2  26.0 - 34.0 pg Final  . MCHC 02/17/2014 34.7  30.0 - 36.0 g/dL Final  . RDW 02/17/2014 12.9  11.5 - 15.5 % Final  . Platelets 02/17/2014 248  150 - 400 K/uL Final  . Sodium 02/17/2014 132* 137 - 147 mEq/L Final  . Potassium 02/17/2014 5.5* 3.7 - 5.3 mEq/L Final  . Chloride 02/17/2014 93* 96 - 112 mEq/L Final  . CO2  02/17/2014 28  19 - 32 mEq/L Final  . Glucose, Bld 02/17/2014 102* 70 - 99 mg/dL Final  . BUN 02/17/2014 18  6 - 23 mg/dL Final  . Creatinine, Ser 02/17/2014 1.24  0.50 - 1.35 mg/dL Final  . Calcium 02/17/2014 10.3  8.4 - 10.5 mg/dL Final  . Total Protein 02/17/2014 8.3  6.0 - 8.3 g/dL Final  . Albumin 02/17/2014 4.4  3.5 - 5.2 g/dL Final  . AST 02/17/2014 31  0 - 37 U/L Final  . ALT 02/17/2014 32  0 - 53 U/L Final  . Alkaline Phosphatase 02/17/2014 87  39 - 117 U/L Final  . Total Bilirubin 02/17/2014 0.4  0.3 - 1.2 mg/dL  Final  . GFR calc non Af Amer 02/17/2014 59* >90 mL/min Final  . GFR calc Af Amer 02/17/2014 68* >90 mL/min Final   Comment: (NOTE)                          The eGFR has been calculated using the CKD EPI equation.                          This calculation has not been validated in all clinical situations.                          eGFR's persistently <90 mL/min signify possible Chronic Kidney                          Disease.  Marland Kitchen Prothrombin Time 02/17/2014 12.9  11.6 - 15.2 seconds Final  . INR 02/17/2014 0.99  0.00 - 1.49 Final  . ABO/RH(D) 02/17/2014 O POS   Final  . Antibody Screen 02/17/2014 NEG   Final  . Sample Expiration 02/17/2014 02/27/2014   Final  . Color, Urine 02/17/2014 YELLOW  YELLOW Final  . APPearance 02/17/2014 CLEAR  CLEAR Final  . Specific Gravity, Urine 02/17/2014 1.008  1.005 - 1.030 Final  . pH 02/17/2014 6.0  5.0 - 8.0 Final  . Glucose, UA 02/17/2014 NEGATIVE  NEGATIVE mg/dL Final  . Hgb urine dipstick 02/17/2014 NEGATIVE  NEGATIVE Final  . Bilirubin Urine 02/17/2014 NEGATIVE  NEGATIVE Final  . Ketones, ur 02/17/2014 NEGATIVE  NEGATIVE mg/dL Final  . Protein, ur 02/17/2014 NEGATIVE  NEGATIVE mg/dL Final  . Urobilinogen, UA 02/17/2014 0.2  0.0 - 1.0 mg/dL Final  . Nitrite 02/17/2014 NEGATIVE  NEGATIVE Final  . Leukocytes, UA 02/17/2014 NEGATIVE  NEGATIVE Final   MICROSCOPIC NOT DONE ON URINES WITH NEGATIVE PROTEIN, BLOOD, LEUKOCYTES, NITRITE, OR GLUCOSE <1000 mg/dL.  Marland Kitchen MRSA, PCR 02/17/2014 NEGATIVE  NEGATIVE Final  . Staphylococcus aureus 02/17/2014 NEGATIVE  NEGATIVE Final   Comment:                                 The Xpert SA Assay (FDA                          approved for NASAL specimens                          in patients over 8 years of age),  is one component of                          a comprehensive surveillance                          program.  Test performance has                          been validated by [facility] for patients greater                          than or equal to 41 year old.                          It is not intended                          to diagnose infection nor to                          guide or monitor treatment.     X-Rays:Dg Chest 2 View  02/17/2014   CLINICAL DATA:  Preop for right knee replacement.  Hypertension.  EXAM: CHEST  2 VIEW  COMPARISON:  12/04/2011  FINDINGS: Cardiac silhouette is normal in size and configuration. Normal mediastinal and hilar contours.  Clear lungs.  No pleural effusion.  No pneumothorax.  Bony thorax is intact.  There are multiple surgical vascular clips in the posterior left upper abdomen, stable.  IMPRESSION: No active cardiopulmonary disease.   Electronically Signed   By: Lajean Manes M.D.   On: 02/17/2014 16:07    EKG: Orders placed during the hospital encounter of 02/17/14  . EKG 12-LEAD  . EKG 12-LEAD  . EKG 12-LEAD  . EKG 12-LEAD     Hospital Course: Connor [name] is a 68 y.o. who was admitted to [facility]. They were brought to the operating room on 02/24/2014 and underwent Procedure(s): RIGHT TOTAL KNEE ARTHROPLASTY.  Patient tolerated the procedure well and was later transferred to the recovery room and then to the orthopaedic floor for postoperative care.  They were given PO and IV analgesics for pain control following their surgery.  They were given 24 hours of postoperative antibiotics of  Anti-infectives   Start     Dose/Rate Route Frequency Ordered Stop   02/24/14 1530  ceFAZolin (ANCEF) IVPB 2 g/50 mL premix     2 g 100 mL/hr over 30 Minutes Intravenous Every 6 hours 02/24/14 1221 02/24/14 2137   02/24/14 0710  ceFAZolin (ANCEF) IVPB 2 g/50 mL premix     2 g 100 mL/hr over 30 Minutes Intravenous On call to O.R. 02/24/14 0710 02/24/14 0926     and started on DVT prophylaxis in the form of Xarelto.   PT and OT were ordered for total joint protocol.  Discharge planning consulted to help with  postop disposition and equipment needs.  Patient had a decent night on the evening of surgery.  They started to get up OOB with therapy on day one. Hemovac drain was pulled without difficulty.  Continued to work with therapy  into day two.  Dressing was changed on day two and the incision was healing well.  Patient was seen in rounds and was ready to go home.   Discharge home with home health  Diet - Cardiac diet  Follow up - in 2 weeks  Activity - WBAT  Disposition - Home  Condition Upon Discharge - Stable  D/C Meds - See DC Summary  DVT Prophylaxis - Xarelto    Discharge Orders   Future Appointments Provider Department Dept Phone   10/13/2014 11:00 AM Philmore Pali, NP [facility] [phone]   Future Orders Complete By Expires   Call MD / Call 911  As directed    Change dressing  As directed    Constipation Prevention  As directed    Diet - low sodium heart healthy  As directed    Discharge instructions  As directed    Do not put a pillow under the knee. Place it under the heel.  As directed    Do not sit on low chairs, stoools or toilet seats, as it may be difficult to get up from low surfaces  As directed    Driving restrictions  As directed    Increase activity slowly as tolerated  As directed    Lifting restrictions  As directed    Patient may shower  As directed    TED hose  As directed    Weight bearing as tolerated  As directed    Questions:     Laterality:     Extremity:         Medication List    STOP taking these medications       ALEVE 220 MG tablet  Generic drug:  naproxen sodium     amoxicillin 500 MG tablet  Commonly known as:  AMOXIL     aspirin EC 81 MG tablet      TAKE these medications       amLODipine-valsartan 5-160 MG per tablet  Commonly known as:  EXFORGE  Take 1 tablet by mouth every morning.     HYDROmorphone 2 MG tablet  Commonly known as:  DILAUDID  Take 1-2 tablets (2-4 mg total) by mouth every 4 (four) hours as  needed for moderate pain or severe pain.     iron polysaccharides 150 MG capsule  Commonly known as:  NIFEREX  Take 1 capsule (150 mg total) by mouth 2 (two) times daily.     lamoTRIgine 100 MG tablet  Commonly known as:  LAMICTAL  Take 100-200 mg by mouth 2 (two) times daily. Takes 1 in the morning and 2 at night     methocarbamol 500 MG tablet  Commonly known as:  ROBAXIN  Take 1 tablet (500 mg total) by mouth every 6 (six) hours as needed for muscle spasms.     rivaroxaban 10 MG Tabs tablet  Commonly known as:  XARELTO  - Take 1 tablet (10 mg total) by mouth daily with breakfast. Take Xarelto for two and a half more weeks, then discontinue Xarelto.  - Once the patient has completed the Xarelto, they may resume the 81 mg Aspirin.     simvastatin 20 MG tablet  Commonly known as:  ZOCOR  Take 20 mg by mouth every evening.     traMADol 50 MG tablet  Commonly known as:  ULTRAM  Take 1-2 tablets (50-100 mg total) by mouth every 6 (six) hours as needed (mild to moderate pain).  Follow-up Information   Follow up with Gearlean Alf, MD. Schedule an appointment as soon as possible for a visit on 03/11/2014. (Call [phone] tomorrow to make the appointment)    Specialty:  Orthopedic Surgery   Contact information:   [address] [place] [address] [phone]       Follow up with [facility] (home health physical and occupational therapy)    Contact information:   [address] [place] [address] [phone]       Follow up with Gearlean Alf, MD. Schedule an appointment as soon as possible for a visit on 03/11/2014.   Specialty:  Orthopedic Surgery   Contact information:   [address] [place] [address] [phone]       Signed: Arlee Muslim, PA-C Orthopaedic Surgery 02/26/2014, 9:25 AM

## 2014-04-17 ENCOUNTER — Encounter: Payer: Self-pay | Admitting: *Deleted

## 2014-05-20 DIAGNOSIS — C61 Malignant neoplasm of prostate: Secondary | ICD-10-CM

## 2014-05-20 HISTORY — DX: Malignant neoplasm of prostate: C61

## 2014-05-20 HISTORY — PX: PROSTATE BIOPSY: SHX241

## 2014-06-03 DIAGNOSIS — R972 Elevated prostate specific antigen [PSA]: Secondary | ICD-10-CM | POA: Insufficient documentation

## 2014-06-23 ENCOUNTER — Encounter: Payer: Self-pay | Admitting: Radiation Oncology

## 2014-06-23 NOTE — Progress Notes (Signed)
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 YES, left neck 2009, [facility]  Pacemaker/ICD? no  Possible current pregnancy? na  Is the patient on methotrexate? no  Current Complaints / other details:  divorced, retired- ran [employer], 2 sons, 4 grandchildren Patient most interested in radiation treatment.  No family hx prostate cancer. Pt has hx left kidney cancer, nephrectomy alone. Head/neck cancer 2009 tx w/chemo/xrt.

## 2014-06-24 ENCOUNTER — Ambulatory Visit
Admission: RE | Admit: 2014-06-24 | Discharge: 2014-06-24 | Disposition: A | Discharge status: Home / Self Care | Payer: Medicare Other | Source: Ambulatory Visit | Attending: Radiation Oncology | Admitting: Radiation Oncology

## 2014-06-24 ENCOUNTER — Telehealth: Payer: Self-pay | Admitting: *Deleted

## 2014-06-24 ENCOUNTER — Encounter: Payer: Self-pay | Admitting: Radiation Oncology

## 2014-06-24 VITALS — BP 165/91 | HR 75 | Temp 97.9°F | Resp 20 | Ht 71.0 in | Wt 207.0 lb

## 2014-06-24 DIAGNOSIS — Z923 Personal history of irradiation: Secondary | ICD-10-CM | POA: Diagnosis not present

## 2014-06-24 DIAGNOSIS — R209 Unspecified disturbances of skin sensation: Secondary | ICD-10-CM | POA: Insufficient documentation

## 2014-06-24 DIAGNOSIS — Z7982 Long term (current) use of aspirin: Secondary | ICD-10-CM | POA: Insufficient documentation

## 2014-06-24 DIAGNOSIS — E785 Hyperlipidemia, unspecified: Secondary | ICD-10-CM | POA: Diagnosis not present

## 2014-06-24 DIAGNOSIS — Z85528 Personal history of other malignant neoplasm of kidney: Secondary | ICD-10-CM | POA: Insufficient documentation

## 2014-06-24 DIAGNOSIS — Z96659 Presence of unspecified artificial knee joint: Secondary | ICD-10-CM | POA: Diagnosis not present

## 2014-06-24 DIAGNOSIS — I69998 Other sequelae following unspecified cerebrovascular disease: Secondary | ICD-10-CM | POA: Insufficient documentation

## 2014-06-24 DIAGNOSIS — Z85819 Personal history of malignant neoplasm of unspecified site of lip, oral cavity, and pharynx: Secondary | ICD-10-CM | POA: Insufficient documentation

## 2014-06-24 DIAGNOSIS — I10 Essential (primary) hypertension: Secondary | ICD-10-CM | POA: Insufficient documentation

## 2014-06-24 DIAGNOSIS — Z905 Acquired absence of kidney: Secondary | ICD-10-CM | POA: Insufficient documentation

## 2014-06-24 DIAGNOSIS — Z510 Encounter for antineoplastic radiation therapy: Secondary | ICD-10-CM | POA: Insufficient documentation

## 2014-06-24 DIAGNOSIS — C61 Malignant neoplasm of prostate: Secondary | ICD-10-CM | POA: Diagnosis not present

## 2014-06-24 HISTORY — DX: Malignant neoplasm of prostate: C61

## 2014-06-24 NOTE — Progress Notes (Signed)
Please see the Nurse Progress Note in the MD Initial Consult Encounter for this patient. 

## 2014-06-24 NOTE — Telephone Encounter (Signed)
CALLED PATIENT TO INFORM OF GOLD SEED PLACEMENT ON 08-11-14- ARRIVAL TIME - 12:45 PM, AND HIS [facility]  APPT. ON 08-26-14- ARRIVAL TIME - 12:30 PM, SPOKE WITH PATIENT AND HE IS AWARE OF THESE APPTS.

## 2014-06-24 NOTE — Progress Notes (Signed)
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 male who is seen today through the courtesy of Dr. 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 In the fall of 2013 he was noted to have an elevated PSA 4.2. A repeat PSA in June of 2014 was up to 5.37, and biopsy was recommended, but he chose observation. A repeat PSA in October 2014 was improved at 4.8 a PSA to his only 10%. His PSA in June of has a 15 was back up to 5.4, and a followup PSA with Dr. Risa Grill on 04/15/2014 was 7.02 with a percentage free PSA of 7. He underwent ultrasound-guided biopsies by Dr. Risa Grill on 05/20/2014. He was found to have Gleason 7 (4+3) involving 40% of one core from the right base, 60% of one core from the right mid gland and 30% of one core from left base. He also had Gleason 7 (3+4) involving 20% of one core from right lateral base, 80% of one core from right lateral mid gland, 70% of one core from left lateral base, 70% of one core from left lateral mid gland and 10% of one core from the left mid gland. He Gleason 6 (3+3) involving 5% of one core from right lateral apex and 9% of one core from left lateral apex and 20% of one core from left apex. His gland volume was approximately 25 cc. He is doing reasonably well from a GU and GI standpoint. His I PSS score is 9. He claims to be potent but is not sexually active. 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  PREVIOUS RADIATION THERAPY: No   PAST MEDICAL HISTORY:  has a past medical history of Head and neck cancer (~ 2009); Hypertension; Chronic kidney disease; Hyperlipidemia; Stroke (DEC 2013); Cancer; Kidney carcinoma; Headache(784.0); Arthritis; Prostate cancer (05/20/14); and Seizures.     PAST SURGICAL HISTORY:  Past Surgical History  Procedure Laterality Date  . Nephrectomy  1990's    left  . Total knee arthroplasty  2011    left  . Hydrocelectomy  11/2000    left  . Tee without cardioversion  10/04/2011    Procedure: TRANSESOPHAGEAL ECHOCARDIOGRAM (TEE);  Surgeon: Candee Furbish, MD;  Location: [facility] ENDOSCOPY;  Service: Cardiovascular;  Laterality: N/A;  . Joint replacement      LEFT TOTAL KNEE ARTHROPLASTY  . Total knee arthroplasty Right 02/24/2014    Procedure: RIGHT TOTAL KNEE ARTHROPLASTY;  Surgeon: Gearlean Alf, MD;  Location: [facility] ORS;  Service: Orthopedics;  Laterality: Right;  . Prostate biopsy  05/20/14    Gleason 4+3=7, vol 25 gm     FAMILY HISTORY: family history includes Diabetes in his mother; Heart disease in his father and mother.   SOCIAL HISTORY:  reports that he has never smoked. He has  never used smokeless tobacco. He reports that he drinks alcohol. He reports that he does not use illicit drugs. Divorced, 2 children. Retired Engineering geologist.   ALLERGIES: Morphine and related   MEDICATIONS:  Current Outpatient Prescriptions  Medication Sig Dispense Refill  . amLODipine-valsartan (EXFORGE) 5-160 MG per tablet Take 1 tablet by mouth every morning.       Marland Kitchen aspirin 81 MG tablet Take 81 mg by mouth daily.      Marland Kitchen lamoTRIgine (LAMICTAL) 100 MG tablet Take 100-200 mg by mouth 2 (two) times daily. Takes 1 in the morning and 2 at night      . simvastatin (ZOCOR) 20 MG tablet Take 20 mg by mouth every evening.       No  current facility-administered medications for this encounter.     REVIEW OF SYSTEMS:  Pertinent items are noted in HPI.    PHYSICAL EXAM:  height is 5\' 11"  (1.803 m) and weight is 207 lb (93.895 kg). His oral temperature is 97.9 F (36.6 C). His blood pressure is 165/91 and his pulse is 75. His respiration is 20.   Alert oriented 68 year old white male appearing his stated age. Head and neck examination: There is moderate induration along his left upper neck with radiation skin changes. Nodes: There is no palpable cervical or supraclavicular lymphadenopathy. Chest: Lungs clear. Abdomen: Without masses organomegaly. Genitalia: Unremarkable to inspection. Rectal: The prostate gland is normal in size, and there is subtle induration along the right mid gland and right base was adjacent to represent biopsy changes. There is no definite periprosthetic tumor extension. Extremities: Without edema. Neurologic examination: There is right-sided mild hypethesia.   LABORATORY DATA:  Lab Results  Component Value Date   WBC 15.9* 02/26/2014   HGB 8.3* 02/26/2014   HCT 24.1* 02/26/2014   MCV 87.3 02/26/2014   PLT 196 02/26/2014   Lab Results  Component Value Date   NA 130* 02/26/2014   K 4.2 02/26/2014   CL 96 02/26/2014   CO2 25 02/26/2014   Lab Results  Component Value Date   ALT 32 02/17/2014   AST 31 02/17/2014   ALKPHOS 87 02/17/2014   BILITOT 0.4 02/17/2014   PSA 7.02 from 04/15/2014.   IMPRESSION: StageT1c intermediate risk adenocarcinoma prostate. I explained to the patient that his prognosis is related to his stage, Gleason score, and PSA level. His stage and PSA level are favorable while his Gleason score of 7 is of intermediate favorability. Other prognostic factors include PSA doubling time and disease volume. His disease volume is unfavorable. I would classify the patient as having intermediate to early high-risk disease based on his Gleason score and high disease volume. We discussed surgery versus  active surveillance versus radiation therapy. After a lengthy discussion he is most interested in radiation therapy which I think is a good choice for him considering his medical comorbidities. We also talked about short term (6 month) and deprivation therapy which I do encourage in this setting. We discussed the side effects of androgen deprivation therapy and also radiation therapy. His radiation therapy options include 5 weeks of external beam followed by seed implantation or 8 weeks of external beam/IMRT. He is most interested in external beam/IMRT which I think would be a good choice for him, and avoid anesthesia. He could receive his radiation therapy closer to home in [place], but the patient prefers to have treatment here in [place]. Consent is signed today. I'll contact Dr. Cy Blamer office to get  him started on 6 months of androgen deprivation therapy and then have Dr. Risa Grill place 3 gold markers within the next few months. I'll see the patient back for a followup visit in approximately 2 months.   PLAN: As discussed above  I spent 60 minutes minutes face to face with the patient and more than 50% of that time was spent in counseling and/or coordination of care.

## 2014-07-02 ENCOUNTER — Telehealth: Payer: Self-pay | Admitting: Diagnostic Neuroimaging

## 2014-07-02 NOTE — Telephone Encounter (Signed)
Spoke to patient and he would really like for Dr. [name] to know he is planning to start his hormone treatment tomorrow. He is requesting to know Dr. Gladstone Lighter opinion about hormone treatment and if it would increase the probability of him having seizures. Requesting to speak with Dr. [name].

## 2014-07-02 NOTE — Telephone Encounter (Signed)
Patient questioning if Hormone treatment for Prostrate treatment starting 9/10 will effect his seizures?  Please call anytime and may leave detailed message on voicemail.

## 2014-07-03 NOTE — Telephone Encounter (Signed)
I called patient. I recommended to continue his therapy as per oncology. -VRP

## 2014-08-22 NOTE — Progress Notes (Signed)
GU Location of Tumor / Histology: prostate adenocarcinoma   If Prostate Cancer, Gleason Score is (4 + 3) and PSA is (7.02 on 04/15/14)   Mickle Asper Helfman presented 2 months ago with signs/symptoms of: elevated PSA since fall of 2013   Biopsies of prosate revealed:  05/20/14 Volume 25 gm   Past/Anticipated interventions by urology, if any: PSA surveillance  Past/Anticipated interventions by medical oncology, if any: no  Weight changes, if any: no  Bowel/Bladder complaints, if any: IPSS 9, sensation of incomplete emptying, weak stream, nocturia x 2  Nausea/Vomiting, if any: no  Pain issues, if any: no   SAFETY ISSUES:  Prior radiation? YES, left neck 2009, [facility]  Pacemaker/ICD? no  Possible current pregnancy? na  Is the patient on methotrexate? No  Current Complaints / other details: divorced, retired- ran [employer], 2 sons, 4 grandchildren  Patient most interested in radiation treatment. Fiducial markers placed on 08/11/14, Dr Risa Grill. Firmagon Sept 10, Oct 12, to receive Lupron on Nov 12  No family hx prostate cancer. Pt has hx left kidney cancer, nephrectomy alone. Head/neck cancer 2009 tx w/chemo/xrt.

## 2014-08-26 ENCOUNTER — Ambulatory Visit
Admission: RE | Admit: 2014-08-26 | Discharge: 2014-08-26 | Disposition: A | Discharge status: Home / Self Care | Payer: Medicare Other | Source: Ambulatory Visit | Attending: Radiation Oncology | Admitting: Radiation Oncology

## 2014-08-26 ENCOUNTER — Encounter: Payer: Self-pay | Admitting: Radiation Oncology

## 2014-08-26 VITALS — BP 148/85 | HR 73 | Temp 98.0°F | Resp 20 | Ht 71.0 in | Wt 207.0 lb

## 2014-08-26 DIAGNOSIS — Z510 Encounter for antineoplastic radiation therapy: Secondary | ICD-10-CM | POA: Diagnosis not present

## 2014-08-26 DIAGNOSIS — R232 Flushing: Secondary | ICD-10-CM | POA: Diagnosis not present

## 2014-08-26 DIAGNOSIS — C61 Malignant neoplasm of prostate: Secondary | ICD-10-CM

## 2014-08-26 NOTE — Progress Notes (Signed)
CC: Dr. Rana Snare  Diagnosis: Stage T1c intermediate risk adenocarcinoma the prostate  Mr. 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 I believe that he began his androgen deprivation therapy the second week in September.  He has been receiving monthly Firmagon. He had 3 gold seeds placed 08/11/2014. He has been doing well except for hot flashes which are most bothersome at night. No significant fatigue. Our plan is to continue with 6 full months of androgen deprivation therapy.  Physical examination: Alert and oriented. Filed Vitals:   08/26/14 1248  BP: 148/85  Pulse: 73  Temp: 98 F (36.7 C)  Resp: 20   Rectal examination not performed today.  Impression: Stage T1c intermediate risk adenocarcinoma prostate. I will have him return for simulation/treatment planning the week of November 23 and have him start his radiation therapy in early December. We again discussed the potential acute and late toxicities of radiation therapy. We discussed treatment with a comfortably full bladder. Consent was previously signed.  Plan: As discussed above.  15 minutes was spent face-to-face with the patient, primarily counseling patient and coordinating his care.

## 2014-09-08 ENCOUNTER — Telehealth: Payer: Self-pay | Admitting: Diagnostic Neuroimaging

## 2014-09-08 NOTE — Telephone Encounter (Signed)
Left message for patient regarding rescheduling 10/13/14 appointment time per [facility] leaving, rescheduled to later time that same day with Dr. [name].

## 2014-09-15 ENCOUNTER — Ambulatory Visit
Admission: RE | Admit: 2014-09-15 | Discharge: 2014-09-15 | Disposition: A | Discharge status: Home / Self Care | Payer: Medicare Other | Source: Ambulatory Visit | Attending: Radiation Oncology | Admitting: Radiation Oncology

## 2014-09-15 DIAGNOSIS — Z510 Encounter for antineoplastic radiation therapy: Secondary | ICD-10-CM | POA: Insufficient documentation

## 2014-09-15 DIAGNOSIS — C61 Malignant neoplasm of prostate: Secondary | ICD-10-CM | POA: Insufficient documentation

## 2014-09-15 NOTE — Progress Notes (Signed)
Complex simulation/treatment planning note: The patient was taken to the CT simulator.  A Vac lock immobilization device was constructed.  A red rubber tube was placed within the rectal vault.  He was then catheterized and contrast instilled into the bladder/urethra.  Set was sent to the  MIM planning system right contoured his prostate, seminal vesicles, rectum, and bladder.  The prostate/GTV will be expanded by 0.8 cm except for 0.5 cm along the rectum.  I prescribing 7800 cGy in 40 sessions to his prostate PTV and 5600 cGy in 40 sessions to his seminal vesicle PTV.  His now ready for IMRT simulation/treatment planning.

## 2014-09-16 ENCOUNTER — Encounter: Payer: Self-pay | Admitting: Radiation Oncology

## 2014-09-16 DIAGNOSIS — Z510 Encounter for antineoplastic radiation therapy: Secondary | ICD-10-CM | POA: Diagnosis not present

## 2014-09-16 NOTE — Progress Notes (Signed)
IMRT simulation/treatment planning note: The patient underwent IMRT simulation/treatment planning in the management of his carcinoma the prostate.  IMRT was chosen to decrease the risk for both acute and late bladder and rectal toxicity compared to 3-D conformal or conventional radiation therapy.  Dose volume histograms were obtained for the target structures including the prostate/seminal vesicles and also avoidance structures including the bladder, rectum, and femoral heads.  We met our departmental guidelines.  I am prescribing 7800 cGy in 40 sessions utilizing dual ARC VMAT IMRT.

## 2014-09-17 DIAGNOSIS — Z510 Encounter for antineoplastic radiation therapy: Secondary | ICD-10-CM | POA: Diagnosis not present

## 2014-09-29 ENCOUNTER — Encounter: Payer: Self-pay | Admitting: Radiation Oncology

## 2014-09-29 ENCOUNTER — Ambulatory Visit
Admission: RE | Admit: 2014-09-29 | Discharge: 2014-09-29 | Disposition: A | Discharge status: Home / Self Care | Payer: Medicare Other | Source: Ambulatory Visit | Attending: Radiation Oncology | Admitting: Radiation Oncology

## 2014-09-29 VITALS — BP 144/83 | HR 74 | Temp 97.6°F | Resp 20 | Wt 208.2 lb

## 2014-09-29 DIAGNOSIS — C61 Malignant neoplasm of prostate: Secondary | ICD-10-CM

## 2014-09-29 DIAGNOSIS — Z510 Encounter for antineoplastic radiation therapy: Secondary | ICD-10-CM | POA: Diagnosis not present

## 2014-09-29 NOTE — Progress Notes (Signed)
Patient education completed with patient. Gave pt "Radiation and You" booklet with all pertinent information marked and discussed, re: rectal discomfort/care, fatigue, urinary/bladder irritation/management, nutrition, pain. Pt verbalized understanding. Teach back method used.  Patient denies pain, urinary/bowel issues, fatigue, loss of appetite.

## 2014-09-29 NOTE — Progress Notes (Signed)
Weekly Management Note:  Site: Prostate Current Dose:  195  cGy Projected Dose: 7800  cGy  Narrative: The patient is seen today for routine under treatment assessment. CBCT/MVCT images/port films were reviewed. The chart was reviewed.   Bladder filling is satisfactory.  He started his radiation therapy today.  No new GU or GI difficulty.  Physical Examination:  Filed Vitals:   09/29/14 1142  BP: 144/83  Pulse: 74  Temp: 97.6 F (36.4 C)  Resp: 20  .  Weight: 208 lb 3.2 oz (94.439 kg).  No change.  Impression: Tolerating radiation therapy well.  Plan: Continue radiation therapy as planned.

## 2014-09-29 NOTE — Progress Notes (Signed)
Chart note: The patient began his VMAT IMRT today with dual arcs.  2 sets of dynamic MLCs are employed representing one set of IMRT treatment devices [phone]).

## 2014-09-30 ENCOUNTER — Ambulatory Visit
Admission: RE | Admit: 2014-09-30 | Discharge: 2014-09-30 | Disposition: A | Discharge status: Home / Self Care | Payer: Medicare Other | Source: Ambulatory Visit | Attending: Radiation Oncology | Admitting: Radiation Oncology

## 2014-09-30 DIAGNOSIS — Z510 Encounter for antineoplastic radiation therapy: Secondary | ICD-10-CM | POA: Diagnosis not present

## 2014-10-01 ENCOUNTER — Ambulatory Visit
Admission: RE | Admit: 2014-10-01 | Discharge: 2014-10-01 | Disposition: A | Discharge status: Home / Self Care | Payer: Medicare Other | Source: Ambulatory Visit | Attending: Radiation Oncology | Admitting: Radiation Oncology

## 2014-10-01 DIAGNOSIS — Z510 Encounter for antineoplastic radiation therapy: Secondary | ICD-10-CM | POA: Diagnosis not present

## 2014-10-02 ENCOUNTER — Ambulatory Visit
Admission: RE | Admit: 2014-10-02 | Discharge: 2014-10-02 | Disposition: A | Discharge status: Home / Self Care | Payer: Medicare Other | Source: Ambulatory Visit | Attending: Radiation Oncology | Admitting: Radiation Oncology

## 2014-10-02 DIAGNOSIS — Z510 Encounter for antineoplastic radiation therapy: Secondary | ICD-10-CM | POA: Diagnosis not present

## 2014-10-03 ENCOUNTER — Ambulatory Visit
Admission: RE | Admit: 2014-10-03 | Discharge: 2014-10-03 | Disposition: A | Discharge status: Home / Self Care | Payer: Medicare Other | Source: Ambulatory Visit | Attending: Radiation Oncology | Admitting: Radiation Oncology

## 2014-10-03 DIAGNOSIS — Z510 Encounter for antineoplastic radiation therapy: Secondary | ICD-10-CM | POA: Diagnosis not present

## 2014-10-06 ENCOUNTER — Ambulatory Visit
Admission: RE | Admit: 2014-10-06 | Discharge: 2014-10-06 | Disposition: A | Discharge status: Home / Self Care | Payer: Medicare Other | Source: Ambulatory Visit | Attending: Radiation Oncology | Admitting: Radiation Oncology

## 2014-10-06 ENCOUNTER — Encounter: Payer: Self-pay | Admitting: Radiation Oncology

## 2014-10-06 VITALS — BP 147/78 | HR 75 | Temp 97.9°F | Resp 20 | Wt 208.4 lb

## 2014-10-06 DIAGNOSIS — C61 Malignant neoplasm of prostate: Secondary | ICD-10-CM

## 2014-10-06 DIAGNOSIS — Z510 Encounter for antineoplastic radiation therapy: Secondary | ICD-10-CM | POA: Diagnosis not present

## 2014-10-06 NOTE — Progress Notes (Addendum)
weekly rad txs prostate, 6/40 completed,  nocturia x2-3 , no dysuria, regular bowel movements, no c/o pain ,no fatigue, appetite good 11:38 AM

## 2014-10-06 NOTE — Progress Notes (Signed)
Weekly Management Note:  Site: prostate Current Dose:   1170  cGy Projected Dose:  7800  cGy  Narrative: The patient is seen today for routine under treatment assessment. CBCT/MVCT images/port films were reviewed. The chart was reviewed.    Bladder filling is satisfactory.  No GU or GI difficulties.  Physical Examination:  Filed Vitals:   10/06/14 1135  BP: 147/78  Pulse: 75  Temp: 97.9 F (36.6 C)  Resp: 20  .  Weight: 208 lb 6.4 oz (94.53 kg).  No change.  Impression: Tolerating radiation therapy well.  Plan: Continue radiation therapy as planned.

## 2014-10-07 ENCOUNTER — Ambulatory Visit
Admission: RE | Admit: 2014-10-07 | Discharge: 2014-10-07 | Disposition: A | Discharge status: Home / Self Care | Payer: Medicare Other | Source: Ambulatory Visit | Attending: Radiation Oncology | Admitting: Radiation Oncology

## 2014-10-07 DIAGNOSIS — Z510 Encounter for antineoplastic radiation therapy: Secondary | ICD-10-CM | POA: Diagnosis not present

## 2014-10-08 ENCOUNTER — Ambulatory Visit
Admission: RE | Admit: 2014-10-08 | Discharge: 2014-10-08 | Disposition: A | Discharge status: Home / Self Care | Payer: Medicare Other | Source: Ambulatory Visit | Attending: Radiation Oncology | Admitting: Radiation Oncology

## 2014-10-08 DIAGNOSIS — Z510 Encounter for antineoplastic radiation therapy: Secondary | ICD-10-CM | POA: Diagnosis not present

## 2014-10-09 ENCOUNTER — Ambulatory Visit
Admission: RE | Admit: 2014-10-09 | Discharge: 2014-10-09 | Disposition: A | Discharge status: Home / Self Care | Payer: Medicare Other | Source: Ambulatory Visit | Attending: Radiation Oncology | Admitting: Radiation Oncology

## 2014-10-09 DIAGNOSIS — Z510 Encounter for antineoplastic radiation therapy: Secondary | ICD-10-CM | POA: Diagnosis not present

## 2014-10-10 ENCOUNTER — Ambulatory Visit
Admission: RE | Admit: 2014-10-10 | Discharge: 2014-10-10 | Disposition: A | Discharge status: Home / Self Care | Payer: Medicare Other | Source: Ambulatory Visit | Attending: Radiation Oncology | Admitting: Radiation Oncology

## 2014-10-10 DIAGNOSIS — Z510 Encounter for antineoplastic radiation therapy: Secondary | ICD-10-CM | POA: Diagnosis not present

## 2014-10-13 ENCOUNTER — Ambulatory Visit: Payer: Medicare Other | Admitting: Nurse Practitioner

## 2014-10-13 ENCOUNTER — Ambulatory Visit
Admission: RE | Admit: 2014-10-13 | Discharge: 2014-10-13 | Disposition: A | Discharge status: Home / Self Care | Payer: Medicare Other | Source: Ambulatory Visit | Attending: Radiation Oncology | Admitting: Radiation Oncology

## 2014-10-13 ENCOUNTER — Ambulatory Visit (INDEPENDENT_AMBULATORY_CARE_PROVIDER_SITE_OTHER): Payer: Medicare Other | Admitting: Diagnostic Neuroimaging

## 2014-10-13 ENCOUNTER — Encounter: Payer: Self-pay | Admitting: Diagnostic Neuroimaging

## 2014-10-13 VITALS — BP 158/83 | HR 78 | Temp 97.7°F | Wt 211.3 lb

## 2014-10-13 VITALS — BP 156/90 | HR 80 | Ht 71.0 in | Wt 211.0 lb

## 2014-10-13 DIAGNOSIS — I6340 Cerebral infarction due to embolism of unspecified cerebral artery: Secondary | ICD-10-CM

## 2014-10-13 DIAGNOSIS — C61 Malignant neoplasm of prostate: Secondary | ICD-10-CM

## 2014-10-13 DIAGNOSIS — G40909 Epilepsy, unspecified, not intractable, without status epilepticus: Secondary | ICD-10-CM

## 2014-10-13 DIAGNOSIS — Z510 Encounter for antineoplastic radiation therapy: Secondary | ICD-10-CM | POA: Diagnosis not present

## 2014-10-13 DIAGNOSIS — I639 Cerebral infarction, unspecified: Secondary | ICD-10-CM

## 2014-10-13 NOTE — Patient Instructions (Signed)
Continue current medications. 

## 2014-10-13 NOTE — Progress Notes (Signed)
Weekly assessment of radiation to pelvis for prostate cancer.Completed 11 of 40 treatments.Denies pain.No problems with urination or bowels.

## 2014-10-13 NOTE — Progress Notes (Signed)
Weekly Management Note:  Site: prostate Current Dose:   2145  cGy Projected Dose:  7800  cGy  Narrative: The patient is seen today for routine under treatment assessment. CBCT/MVCT images/port films were reviewed. The chart was reviewed. No complaints  Physical Examination:  Filed Vitals:   10/13/14 1117  BP: 158/83  Pulse: 78  Temp: 97.7 F (36.5 C)   Weight: 211 lb 4.8 oz (95.845 kg).  NAD   Impression: Tolerating radiation therapy well.  Plan: Continue radiation therapy as planned. -----------------------------------  Eppie Gibson, MD

## 2014-10-13 NOTE — Progress Notes (Signed)
[facility]  PATIENT: Connor [name] DOB: 10-Dec-1945  REFERRING CLINICIAN:  HISTORY FROM: patient  REASON FOR VISIT: follow up (transfer, [name])   HISTORICAL  CHIEF COMPLAINT:  Chief Complaint  Patient presents with  . Follow-up    RM 7  . Embolic stroke    HISTORY OF PRESENT ILLNESS:   UPDATE 10/13/14: Since last visit, now on prostate CA therapy (radiation and hormone). No seizures. No new stroke like events. Overall stable neurologically.   UPDATE 10/11/13: Since last visit, stable. No seizures since Dec 2012. Still with some balance difficulty. Tolerating LTG.  PRIOR HPI (12/17/11, [name]): 68 year old right-handed white divorced male with a history of episodes in which his mind would suddenly blankout beginning in 1984.  It would be an unusual sensation and during the episodes he could not respond. He had an episode while on a plane trip to [facility] 10/1992 with a weird sensation and then a generalized major motor seizure.  The plane landed in [place]  and he was evaluated at the [facility]. CAT scan without dye, EKG, CBC, SMA 6, SMA 12, magnesium level were normal and  he was placed on Dilantin 300 mg per day. A second generalized major motor seizure occurred 04/1993 and  I first saw him 05/31/1993. He was drinking 42 beers per week. Because the seizures were simple partial and complex partial with secondary generalization. It was felt his seizures might not  be related to alcohol. 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 He was placed on Dilantin medication and subsequently switched to Lamictal in 2006. He  tried to taper lamotrigine several times and  had generalized major motor seizures. This occurred in 2009. He had a CAT scan for evaluation and a bone density scan 12/06/2004. The foreign body was removed from the right orbit . He previously had an MRI of his lumbar spine 12/13/2006 showing  severe canal stenosis at L4-5 anterior listhesis of 4-5 mm and severe facet hypertrophy and a large synovial cyst emanating from the left facet joint. There were also degenerative changes on the right at L5-S1 and facet hypertrophy at L5-S1. There was  disc bulging at L3-4.  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 He underwent radiation therapy and chemotherapy at [facility]. CT of the neck with contrast 07/12/11 showed no evidence of recurrent  disease. 7/12 he had a nocturnal generalized seizure. He awoke at night having bitten his tongue.  He had been trying to taper lamotrigine. 12/13/10 while [place] the [place] and drinking beer regularly during the  the day he had a seizure. 08/29/11 he had  another seizure.  In November, he  had persistent left-sided headache occurring on a daily basis. Wednesday 09/28/11, he was eating breakfast with friends in a restaurant and noted the onset of the left sided headache with numbness in the left forehead extending down the left side of his face and then involving his entire body with a  tingling sensation or numbness,worse on the left side of his body than the right. He became weak in both arms and both legs and slumped in his chair.  911 was called and he was seen at [facility] emergency room. CT  scan of the brain without contrast was unremarkable and MRI study of the brain without and with contrast which was reviewed by Dr. Patton Salles was negative for acute stroke. At home he noted falling to his left, double vision, and oscillopsia. He did  have hiccups. 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  CT angiogram showed no evidence of posterior circulation stenosis or occlusion and TEE  showed normal EF and no emboli source. In the hospital he had a seizure and Lamictal was increas to 100 mg AM and 200 mg PM.He was improving  but 2/9 after having one beer he noticed dizziness and blurred vision  with vertigo and decreased balance. He was seen in the [facility] ER 12/04/11 MRI showing no evidence of acute stroke. He has slowly improved. He denies palpitations. He has spinning, double vision, hiccups,numbness left face and right body, and balance problems. He denies auras of seizures.  REVIEW OF SYSTEMS: Full 14 system review of systems performed and notable only for fatigue eye redness double vision eye pain flushing trouble swallowing gait diff memory loss.   ALLERGIES: Allergies  Allergen Reactions  . Morphine And Related Nausea And Vomiting    HOME MEDICATIONS: Outpatient Prescriptions Prior to Visit  Medication Sig Dispense Refill  . amLODipine-valsartan (EXFORGE) 5-160 MG per tablet Take 1 tablet by mouth every morning.     Marland Kitchen aspirin 81 MG tablet Take 81 mg by mouth daily.    Marland Kitchen lamoTRIgine (LAMICTAL) 100 MG tablet Take 100-200 mg by mouth 2 (two) times daily. Takes 2 in the morning and 1 at night    . simvastatin (ZOCOR) 20 MG tablet Take 20 mg by mouth every evening.     No facility-administered medications prior to visit.    PAST MEDICAL HISTORY: Past Medical History  Diagnosis Date  . Head and neck cancer ~ 2009    S/P radiation & [name]  . Hypertension   . Chronic kidney disease   . Hyperlipidemia   . Stroke DEC 2013    UNABLE TO SPEAK OR MOVE AND RT SIDE WEAKNESS AND LOSS OF SKIN SENSITIVITY TO HEAT AND COLD ON RT SIDE, DOUBLE VISION. BALANCE PROBLEMS---STATES STILL HAS DOUBLE VISION AND BALANCE PROBLEM AND RT SIDED LOSS OF SKIN SENSITIVITY  . Cancer     h/o neck - ABOUT 6 YRS AGO - TX'D WITH RADIATION AND CHEMO   . Kidney carcinoma     h/o - NEPHRECTOMY   . Headache(784.0)   . Arthritis     OA AND PAIN RT KNEE  . Prostate cancer 05/20/14     Gleason 4+3=7, volume 25 gm  . Seizures     hx of x yrs, "the bad kind;bite tongue; STATES LAST [facility] 2013; WAS SEEING [name] - HE RETIRED AND PT LAST SAW DR. [name]    PAST SURGICAL HISTORY: Past Surgical History  Procedure Laterality Date  . Nephrectomy  1990's    left  . Total knee arthroplasty  2011    left  . Hydrocelectomy  11/2000    left  . Tee without cardioversion  10/04/2011    Procedure: TRANSESOPHAGEAL ECHOCARDIOGRAM (TEE);  Surgeon: Candee Furbish, MD;  Location: [facility] ENDOSCOPY;  Service: Cardiovascular;  Laterality: N/A;  . Joint replacement      LEFT TOTAL KNEE ARTHROPLASTY  . Total knee arthroplasty Right 02/24/2014  Procedure: RIGHT TOTAL KNEE ARTHROPLASTY;  Surgeon: Gearlean Alf, MD;  Location: [facility] ORS;  Service: Orthopedics;  Laterality: Right;  . Prostate biopsy  05/20/14    Gleason 4+3=7, vol 25 gm    FAMILY HISTORY: Family History  Problem Relation Age of Onset  . Diabetes Mother   . Heart disease Mother   . Heart disease Father     SOCIAL HISTORY:  History   Social History  . Marital Status: Divorced    Spouse Name: N/A    Number of Children: 2  . Years of Education: 12th   Occupational History  . owner Other    [employer]   Social History Main Topics  . Smoking status: Never Smoker   . Smokeless tobacco: Never Used  . Alcohol Use: 0.0 oz/week    0 Not specified per week     Comment: WAS 6 TO 8 BEERS DAILY   . Drug Use: No  . Sexual Activity: Not Currently   Other Topics Concern  . Not on file   Social History Narrative   Patient is divorced with 2 children.   Patient is right handed.   Patient has hs education.   Patient drinks 2 cups daily.     PHYSICAL EXAM  Filed Vitals:   10/13/14 1430  BP: 156/90  Pulse: 80  Height: 5\' 11"  (1.803 m)  Weight: 211 lb (95.709 kg)    Not recorded      Body mass index is 29.44 kg/(m^2).  GENERAL EXAM: Patient is in no distress; well  developed, nourished and groomed; neck is supple  CARDIOVASCULAR: Regular rate and rhythm, no murmurs, no carotid bruits  NEUROLOGIC: MENTAL STATUS: awake, alert, oriented to person, place and time, recent and remote memory intact, normal attention and concentration, language fluent, comprehension intact, naming intact, fund of knowledge appropriate CRANIAL NERVE: no papilledema on fundoscopic exam, pupils reactive (RIGHT 3, LEFT 2), LEFT PTOSIS, visual fields full to confrontation, extraocular muscles intact, BLURRED VISION IN RIGHT OR LEFT GAZE. Facial sensation DECR IN LEFT V1, V2; facial strength symmetric, hearing intact, palate elevates symmetrically, uvula midline, shoulder shrug symmetric, TONGUE DEVIATES TO THE LEFT. MOTOR: normal bulk and tone, full strength in the BUE, RLE; LLE (HF 4+, OTHERWISE 5) SENSORY: DECR IN RIGHT ARM/LEG TO PP AND TEMP. VIB NORMAL. COORDINATION: DYSMETRIA IN LUE > RUE. REFLEXES: deep tendon reflexes present and symmetric GAIT/STATION: WIDE, SLOW, UNSTEADY GAIT.    DIAGNOSTIC DATA (LABS, IMAGING, TESTING) - I reviewed patient records, labs, notes, testing and imaging myself where available.  Lab Results  Component Value Date   WBC 15.9* 02/26/2014   HGB 8.3* 02/26/2014   HCT 24.1* 02/26/2014   MCV 87.3 02/26/2014   PLT 196 02/26/2014      Component Value Date/Time   NA 130* 02/26/2014 0512   K 4.2 02/26/2014 0512   CL 96 02/26/2014 0512   CO2 25 02/26/2014 0512   GLUCOSE 144* 02/26/2014 0512   BUN 16 02/26/2014 0512   CREATININE 1.03 02/26/2014 0512   CALCIUM 8.5 02/26/2014 0512   PROT 8.3 02/17/2014 1350   ALBUMIN 4.4 02/17/2014 1350   AST 31 02/17/2014 1350   ALT 32 02/17/2014 1350   ALKPHOS 87 02/17/2014 1350   BILITOT 0.4 02/17/2014 1350   GFRNONAA 74* 02/26/2014 0512   GFRAA 85* 02/26/2014 0512   Lab Results  Component Value Date   CHOL 149 12/05/2011   HDL 56 12/05/2011   LDLCALC 68 12/05/2011  TRIG 123 12/05/2011   CHOLHDL  2.7 12/05/2011   Lab Results  Component Value Date   HGBA1C 5.3 12/05/2011   No results found for: [id number] Lab Results  Component Value Date   TSH 1.207 12/05/2011    ASSESSMENT AND PLAN  68 y.o. year old male here with history of seizure since 1994. 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 Overall stable. Last seizure in 2012.   PLAN: - continue LTG 100 / 200  Return in about 1 year (around 10/14/2015).    Penni Bombard, MD 58/25/1898, 4:21 PM Certified in Neurology, Neurophysiology and Neuroimaging  [facility] [address], [place], [place] [address] [phone]

## 2014-10-14 ENCOUNTER — Ambulatory Visit
Admission: RE | Admit: 2014-10-14 | Discharge: 2014-10-14 | Disposition: A | Discharge status: Home / Self Care | Payer: Medicare Other | Source: Ambulatory Visit | Attending: Radiation Oncology | Admitting: Radiation Oncology

## 2014-10-14 DIAGNOSIS — Z510 Encounter for antineoplastic radiation therapy: Secondary | ICD-10-CM | POA: Diagnosis not present

## 2014-10-15 ENCOUNTER — Ambulatory Visit
Admission: RE | Admit: 2014-10-15 | Discharge: 2014-10-15 | Disposition: A | Discharge status: Home / Self Care | Payer: Medicare Other | Source: Ambulatory Visit | Attending: Radiation Oncology | Admitting: Radiation Oncology

## 2014-10-15 DIAGNOSIS — Z510 Encounter for antineoplastic radiation therapy: Secondary | ICD-10-CM | POA: Diagnosis not present

## 2014-10-16 ENCOUNTER — Ambulatory Visit
Admission: RE | Admit: 2014-10-16 | Discharge: 2014-10-16 | Disposition: A | Discharge status: Home / Self Care | Payer: Medicare Other | Source: Ambulatory Visit | Attending: Radiation Oncology | Admitting: Radiation Oncology

## 2014-10-16 DIAGNOSIS — Z510 Encounter for antineoplastic radiation therapy: Secondary | ICD-10-CM | POA: Diagnosis not present

## 2014-10-20 ENCOUNTER — Encounter: Payer: Self-pay | Admitting: Radiation Oncology

## 2014-10-20 ENCOUNTER — Ambulatory Visit
Admission: RE | Admit: 2014-10-20 | Discharge: 2014-10-20 | Disposition: A | Discharge status: Home / Self Care | Payer: Medicare Other | Source: Ambulatory Visit | Attending: Radiation Oncology | Admitting: Radiation Oncology

## 2014-10-20 ENCOUNTER — Encounter: Payer: Self-pay | Admitting: Diagnostic Neuroimaging

## 2014-10-20 VITALS — BP 151/80 | Temp 98.3°F | Wt 209.3 lb

## 2014-10-20 DIAGNOSIS — C61 Malignant neoplasm of prostate: Secondary | ICD-10-CM

## 2014-10-20 DIAGNOSIS — Z510 Encounter for antineoplastic radiation therapy: Secondary | ICD-10-CM | POA: Diagnosis not present

## 2014-10-20 NOTE — Progress Notes (Signed)
Connor [name] has received 15 fractions to his pelvis.  He states that he voids every 2-3 during the night and has a slow stream and feels, at times, that he does not empty completely.  Denies any pain at this time, and denies any diarrheal stools or rectal irritation.

## 2014-10-20 NOTE — Progress Notes (Signed)
Weekly Management Note:  Site: Prostate Current Dose:  2925  cGy Projected Dose: 7800  cGy  Narrative: The patient is seen today for routine under treatment assessment. CBCT/MVCT images/port films were reviewed. The chart was reviewed.   Bladder filling remains excellent.  He does report nocturia 2-3 along with some slowing of his urinary stream.  No GI difficulty.  Physical Examination:  Filed Vitals:   10/20/14 1136  BP: 151/80  Temp: 98.3 F (36.8 C)  .  Weight: 209 lb 4.8 oz (94.938 kg).  No change.  Impression: Tolerating radiation therapy well.  I do not feel that he is having his bladder.  I will start him on tamsulosin if his urinary stream weakens any further.  Plan: Continue radiation therapy as planned.

## 2014-10-21 ENCOUNTER — Ambulatory Visit
Admission: RE | Admit: 2014-10-21 | Discharge: 2014-10-21 | Disposition: A | Discharge status: Home / Self Care | Payer: Medicare Other | Source: Ambulatory Visit | Attending: Radiation Oncology | Admitting: Radiation Oncology

## 2014-10-21 DIAGNOSIS — Z510 Encounter for antineoplastic radiation therapy: Secondary | ICD-10-CM | POA: Diagnosis not present

## 2014-10-22 ENCOUNTER — Ambulatory Visit
Admission: RE | Admit: 2014-10-22 | Discharge: 2014-10-22 | Disposition: A | Discharge status: Home / Self Care | Payer: Medicare Other | Source: Ambulatory Visit | Attending: Radiation Oncology | Admitting: Radiation Oncology

## 2014-10-22 DIAGNOSIS — Z510 Encounter for antineoplastic radiation therapy: Secondary | ICD-10-CM | POA: Diagnosis not present

## 2014-10-23 ENCOUNTER — Ambulatory Visit
Admission: RE | Admit: 2014-10-23 | Discharge: 2014-10-23 | Disposition: A | Discharge status: Home / Self Care | Payer: Medicare Other | Source: Ambulatory Visit | Attending: Radiation Oncology | Admitting: Radiation Oncology

## 2014-10-23 DIAGNOSIS — Z510 Encounter for antineoplastic radiation therapy: Secondary | ICD-10-CM | POA: Diagnosis not present

## 2014-10-24 DIAGNOSIS — C61 Malignant neoplasm of prostate: Secondary | ICD-10-CM | POA: Diagnosis not present

## 2014-10-24 DIAGNOSIS — Z510 Encounter for antineoplastic radiation therapy: Secondary | ICD-10-CM | POA: Diagnosis not present

## 2014-10-27 ENCOUNTER — Ambulatory Visit
Admission: RE | Admit: 2014-10-27 | Discharge: 2014-10-27 | Disposition: A | Discharge status: Home / Self Care | Payer: Medicare Other | Source: Ambulatory Visit | Attending: Radiation Oncology | Admitting: Radiation Oncology

## 2014-10-27 ENCOUNTER — Encounter: Payer: Self-pay | Admitting: Radiation Oncology

## 2014-10-27 VITALS — BP 152/129 | HR 73 | Temp 97.9°F | Resp 10 | Wt 210.4 lb

## 2014-10-27 DIAGNOSIS — C61 Malignant neoplasm of prostate: Secondary | ICD-10-CM | POA: Diagnosis not present

## 2014-10-27 DIAGNOSIS — Z510 Encounter for antineoplastic radiation therapy: Secondary | ICD-10-CM | POA: Diagnosis not present

## 2014-10-27 NOTE — Progress Notes (Signed)
Weekly Management Note:  Site: Prostate Current Dose:  3705  cGy Projected Dose: 7800  cGy  Narrative: The patient is seen today for routine under treatment assessment. CBCT/MVCT images/port films were reviewed. The chart was reviewed.   Bladder filling is excellent.  He is doing well from a GU and GI standpoint.  He continues to have hot flashes as expected.  Physical Examination:  Filed Vitals:   10/27/14 1147  BP: 152/129  Pulse: 73  Temp: 97.9 F (36.6 C)  Resp: 10  .  Weight: 210 lb 6.4 oz (95.437 kg).  No change.  Impression: Tolerating radiation therapy well.  Plan: Continue radiation therapy as planned.

## 2014-10-27 NOTE — Progress Notes (Signed)
He is currently in no pain. Reports urinary hesitancy and sweats.  Pt states they urinate 2 - 3 times per night.  Pt reports soft bowel movement every other day.

## 2014-10-28 ENCOUNTER — Ambulatory Visit
Admission: RE | Admit: 2014-10-28 | Discharge: 2014-10-28 | Disposition: A | Discharge status: Home / Self Care | Payer: Medicare Other | Source: Ambulatory Visit | Attending: Radiation Oncology | Admitting: Radiation Oncology

## 2014-10-28 DIAGNOSIS — Z510 Encounter for antineoplastic radiation therapy: Secondary | ICD-10-CM | POA: Diagnosis not present

## 2014-10-28 DIAGNOSIS — C61 Malignant neoplasm of prostate: Secondary | ICD-10-CM | POA: Diagnosis not present

## 2014-10-29 ENCOUNTER — Ambulatory Visit
Admission: RE | Admit: 2014-10-29 | Discharge: 2014-10-29 | Disposition: A | Discharge status: Home / Self Care | Payer: Medicare Other | Source: Ambulatory Visit | Attending: Radiation Oncology | Admitting: Radiation Oncology

## 2014-10-29 DIAGNOSIS — C61 Malignant neoplasm of prostate: Secondary | ICD-10-CM | POA: Diagnosis not present

## 2014-10-29 DIAGNOSIS — Z510 Encounter for antineoplastic radiation therapy: Secondary | ICD-10-CM | POA: Diagnosis not present

## 2014-10-30 ENCOUNTER — Ambulatory Visit
Admission: RE | Admit: 2014-10-30 | Discharge: 2014-10-30 | Disposition: A | Discharge status: Home / Self Care | Payer: Medicare Other | Source: Ambulatory Visit | Attending: Radiation Oncology | Admitting: Radiation Oncology

## 2014-10-30 DIAGNOSIS — Z510 Encounter for antineoplastic radiation therapy: Secondary | ICD-10-CM | POA: Diagnosis not present

## 2014-10-30 DIAGNOSIS — C61 Malignant neoplasm of prostate: Secondary | ICD-10-CM | POA: Diagnosis not present

## 2014-10-31 ENCOUNTER — Ambulatory Visit
Admission: RE | Admit: 2014-10-31 | Discharge: 2014-10-31 | Disposition: A | Discharge status: Home / Self Care | Payer: Medicare Other | Source: Ambulatory Visit | Attending: Radiation Oncology | Admitting: Radiation Oncology

## 2014-10-31 DIAGNOSIS — C61 Malignant neoplasm of prostate: Secondary | ICD-10-CM | POA: Diagnosis not present

## 2014-10-31 DIAGNOSIS — Z510 Encounter for antineoplastic radiation therapy: Secondary | ICD-10-CM | POA: Diagnosis not present

## 2014-11-03 ENCOUNTER — Ambulatory Visit
Admission: RE | Admit: 2014-11-03 | Discharge: 2014-11-03 | Disposition: A | Discharge status: Home / Self Care | Payer: Medicare Other | Source: Ambulatory Visit | Attending: Radiation Oncology | Admitting: Radiation Oncology

## 2014-11-03 ENCOUNTER — Encounter: Payer: Self-pay | Admitting: Radiation Oncology

## 2014-11-03 VITALS — BP 152/73 | HR 72 | Temp 97.7°F | Ht 71.0 in | Wt 212.2 lb

## 2014-11-03 DIAGNOSIS — Z510 Encounter for antineoplastic radiation therapy: Secondary | ICD-10-CM | POA: Diagnosis not present

## 2014-11-03 DIAGNOSIS — C61 Malignant neoplasm of prostate: Secondary | ICD-10-CM

## 2014-11-03 NOTE — Progress Notes (Addendum)
Connor [name] has received 24 fractions to his pelvis for prostate cancer.  He denies any dysuria and reports that he has nocutria on the average of twice nightly.  Denies any proctitis, loose or diarrheal stools.

## 2014-11-03 NOTE — Progress Notes (Signed)
Weekly Management Note:  Site: Prostate  Current Dose:  4680  cGy Projected Dose: 7800  cGy  Narrative: The patient is seen today for routine under treatment assessment. CBCT/MVCT images/port films were reviewed. The chart was reviewed.   Bladder filling is excellent.  No GU or GI difficulties.  Physical Examination:  Filed Vitals:   11/03/14 1132  BP: 152/73  Pulse: 72  Temp: 97.7 F (36.5 C)  .  Weight: 212 lb 3.2 oz (96.253 kg).  No change.  Impression: Tolerating radiation therapy well.  Plan: Continue radiation therapy as planned.

## 2014-11-04 ENCOUNTER — Ambulatory Visit
Admission: RE | Admit: 2014-11-04 | Discharge: 2014-11-04 | Disposition: A | Discharge status: Home / Self Care | Payer: Medicare Other | Source: Ambulatory Visit | Attending: Radiation Oncology | Admitting: Radiation Oncology

## 2014-11-04 DIAGNOSIS — C61 Malignant neoplasm of prostate: Secondary | ICD-10-CM | POA: Diagnosis not present

## 2014-11-04 DIAGNOSIS — Z510 Encounter for antineoplastic radiation therapy: Secondary | ICD-10-CM | POA: Diagnosis not present

## 2014-11-05 ENCOUNTER — Ambulatory Visit
Admission: RE | Admit: 2014-11-05 | Discharge: 2014-11-05 | Disposition: A | Discharge status: Home / Self Care | Payer: Medicare Other | Source: Ambulatory Visit | Attending: Radiation Oncology | Admitting: Radiation Oncology

## 2014-11-05 DIAGNOSIS — C61 Malignant neoplasm of prostate: Secondary | ICD-10-CM | POA: Diagnosis not present

## 2014-11-05 DIAGNOSIS — Z510 Encounter for antineoplastic radiation therapy: Secondary | ICD-10-CM | POA: Diagnosis not present

## 2014-11-06 ENCOUNTER — Ambulatory Visit
Admission: RE | Admit: 2014-11-06 | Discharge: 2014-11-06 | Disposition: A | Discharge status: Home / Self Care | Payer: Medicare Other | Source: Ambulatory Visit | Attending: Radiation Oncology | Admitting: Radiation Oncology

## 2014-11-06 DIAGNOSIS — C61 Malignant neoplasm of prostate: Secondary | ICD-10-CM | POA: Diagnosis not present

## 2014-11-06 DIAGNOSIS — Z510 Encounter for antineoplastic radiation therapy: Secondary | ICD-10-CM | POA: Diagnosis not present

## 2014-11-07 ENCOUNTER — Ambulatory Visit
Admission: RE | Admit: 2014-11-07 | Discharge: 2014-11-07 | Disposition: A | Discharge status: Home / Self Care | Payer: Medicare Other | Source: Ambulatory Visit | Attending: Radiation Oncology | Admitting: Radiation Oncology

## 2014-11-07 DIAGNOSIS — C61 Malignant neoplasm of prostate: Secondary | ICD-10-CM | POA: Diagnosis not present

## 2014-11-07 DIAGNOSIS — Z510 Encounter for antineoplastic radiation therapy: Secondary | ICD-10-CM | POA: Diagnosis not present

## 2014-11-10 ENCOUNTER — Encounter: Payer: Self-pay | Admitting: Radiation Oncology

## 2014-11-10 ENCOUNTER — Ambulatory Visit
Admission: RE | Admit: 2014-11-10 | Discharge: 2014-11-10 | Disposition: A | Discharge status: Home / Self Care | Payer: Medicare Other | Source: Ambulatory Visit | Attending: Radiation Oncology | Admitting: Radiation Oncology

## 2014-11-10 VITALS — BP 148/77 | HR 77 | Temp 97.9°F | Resp 12 | Ht 71.0 in | Wt 212.7 lb

## 2014-11-10 DIAGNOSIS — C61 Malignant neoplasm of prostate: Secondary | ICD-10-CM | POA: Diagnosis not present

## 2014-11-10 DIAGNOSIS — Z510 Encounter for antineoplastic radiation therapy: Secondary | ICD-10-CM | POA: Diagnosis not present

## 2014-11-10 NOTE — Progress Notes (Signed)
Weekly Management Note:  Site: Prostate Current Dose:  5655  cGy Projected Dose: 7800  cGy  Narrative: The patient is seen today for routine under treatment assessment. CBCT/MVCT images/port films were reviewed. The chart was reviewed.   Bladder filling is excellent.  No significant GU or GI difficulties.  He does have some slowing of his stream and is getting up 3 times a night.  He declines tamsulosin.  Physical Examination:  Filed Vitals:   11/10/14 1122  BP: 148/77  Pulse: 77  Temp: 97.9 F (36.6 C)  Resp: 12  .  Weight: 212 lb 11.2 oz (96.48 kg).  No change.  Impression: Tolerating radiation therapy well.  Plan: Continue radiation therapy as planned.

## 2014-11-10 NOTE — Progress Notes (Addendum)
Connor [name] has completed 29 fractions to his prostate.  He denies pain, dysuria, hematuria and diarrhea.  He reports getting up 3 times per night to urinate.  He reports having a little fatigue.   BP 148/77 mmHg  Pulse 77  Temp(Src) 97.9 F (36.6 C) (Oral)  Resp 12  Ht 5\' 11"  (1.803 m)  Wt 212 lb 11.2 oz (96.48 kg)  BMI 29.68 kg/m2

## 2014-11-11 ENCOUNTER — Ambulatory Visit
Admission: RE | Admit: 2014-11-11 | Discharge: 2014-11-11 | Disposition: A | Discharge status: Home / Self Care | Payer: Medicare Other | Source: Ambulatory Visit | Attending: Radiation Oncology | Admitting: Radiation Oncology

## 2014-11-11 DIAGNOSIS — Z510 Encounter for antineoplastic radiation therapy: Secondary | ICD-10-CM | POA: Diagnosis not present

## 2014-11-11 DIAGNOSIS — C61 Malignant neoplasm of prostate: Secondary | ICD-10-CM | POA: Diagnosis not present

## 2014-11-12 ENCOUNTER — Ambulatory Visit
Admission: RE | Admit: 2014-11-12 | Discharge: 2014-11-12 | Disposition: A | Discharge status: Home / Self Care | Payer: Medicare Other | Source: Ambulatory Visit | Attending: Radiation Oncology | Admitting: Radiation Oncology

## 2014-11-12 DIAGNOSIS — C61 Malignant neoplasm of prostate: Secondary | ICD-10-CM | POA: Diagnosis not present

## 2014-11-12 DIAGNOSIS — Z510 Encounter for antineoplastic radiation therapy: Secondary | ICD-10-CM | POA: Diagnosis not present

## 2014-11-13 ENCOUNTER — Ambulatory Visit
Admission: RE | Admit: 2014-11-13 | Discharge: 2014-11-13 | Disposition: A | Discharge status: Home / Self Care | Payer: Medicare Other | Source: Ambulatory Visit | Attending: Radiation Oncology | Admitting: Radiation Oncology

## 2014-11-13 DIAGNOSIS — Z510 Encounter for antineoplastic radiation therapy: Secondary | ICD-10-CM | POA: Diagnosis not present

## 2014-11-13 DIAGNOSIS — C61 Malignant neoplasm of prostate: Secondary | ICD-10-CM | POA: Diagnosis not present

## 2014-11-14 ENCOUNTER — Ambulatory Visit: Payer: Medicare Other

## 2014-11-17 ENCOUNTER — Ambulatory Visit
Admission: RE | Admit: 2014-11-17 | Discharge: 2014-11-17 | Disposition: A | Discharge status: Home / Self Care | Payer: Medicare Other | Source: Ambulatory Visit | Attending: Radiation Oncology | Admitting: Radiation Oncology

## 2014-11-17 ENCOUNTER — Encounter: Payer: Self-pay | Admitting: Radiation Oncology

## 2014-11-17 VITALS — BP 152/75 | HR 77 | Temp 98.2°F | Ht 71.0 in | Wt 210.4 lb

## 2014-11-17 DIAGNOSIS — C61 Malignant neoplasm of prostate: Secondary | ICD-10-CM

## 2014-11-17 DIAGNOSIS — Z510 Encounter for antineoplastic radiation therapy: Secondary | ICD-10-CM | POA: Diagnosis not present

## 2014-11-17 NOTE — Progress Notes (Signed)
Weekly Management Note:  Site: Prostate Current Dose:  6435  cGy Projected Dose: 7800  cGy  Narrative: The patient is seen today for routine under treatment assessment. CBCT/MVCT images/port films were reviewed. The chart was reviewed.   Bladder filling remains excellent.  No new GU or GI difficulties.  He does report a weak urinary stream with nocturia 3.  Physical Examination:  Filed Vitals:   11/17/14 1134  BP: 152/75  Pulse: 77  Temp: 98.2 F (36.8 C)  .  Weight: 210 lb 6.4 oz (95.437 kg).  No change.  Impression: Tolerating radiation therapy well.  Plan: Continue radiation therapy as planned.

## 2014-11-17 NOTE — Progress Notes (Signed)
Connor [name] has received 33 fraction to his pelvis for prostate cancer.  He reports a weak stream and nocturia x 3.  Denies any dysuria, proctitis nor diarrhea

## 2014-11-18 ENCOUNTER — Ambulatory Visit
Admission: RE | Admit: 2014-11-18 | Discharge: 2014-11-18 | Disposition: A | Discharge status: Home / Self Care | Payer: Medicare Other | Source: Ambulatory Visit | Attending: Radiation Oncology | Admitting: Radiation Oncology

## 2014-11-18 DIAGNOSIS — Z510 Encounter for antineoplastic radiation therapy: Secondary | ICD-10-CM | POA: Diagnosis not present

## 2014-11-18 DIAGNOSIS — C61 Malignant neoplasm of prostate: Secondary | ICD-10-CM | POA: Diagnosis not present

## 2014-11-19 ENCOUNTER — Ambulatory Visit
Admission: RE | Admit: 2014-11-19 | Discharge: 2014-11-19 | Disposition: A | Discharge status: Home / Self Care | Payer: Medicare Other | Source: Ambulatory Visit | Attending: Radiation Oncology | Admitting: Radiation Oncology

## 2014-11-19 DIAGNOSIS — C61 Malignant neoplasm of prostate: Secondary | ICD-10-CM | POA: Diagnosis not present

## 2014-11-19 DIAGNOSIS — Z510 Encounter for antineoplastic radiation therapy: Secondary | ICD-10-CM | POA: Diagnosis not present

## 2014-11-20 ENCOUNTER — Ambulatory Visit
Admission: RE | Admit: 2014-11-20 | Discharge: 2014-11-20 | Disposition: A | Discharge status: Home / Self Care | Payer: Medicare Other | Source: Ambulatory Visit | Attending: Radiation Oncology | Admitting: Radiation Oncology

## 2014-11-20 DIAGNOSIS — C61 Malignant neoplasm of prostate: Secondary | ICD-10-CM | POA: Diagnosis not present

## 2014-11-20 DIAGNOSIS — Z510 Encounter for antineoplastic radiation therapy: Secondary | ICD-10-CM | POA: Diagnosis not present

## 2014-11-21 ENCOUNTER — Ambulatory Visit
Admission: RE | Admit: 2014-11-21 | Discharge: 2014-11-21 | Disposition: A | Discharge status: Home / Self Care | Payer: Medicare Other | Source: Ambulatory Visit | Attending: Radiation Oncology | Admitting: Radiation Oncology

## 2014-11-21 DIAGNOSIS — C61 Malignant neoplasm of prostate: Secondary | ICD-10-CM | POA: Diagnosis not present

## 2014-11-21 DIAGNOSIS — Z510 Encounter for antineoplastic radiation therapy: Secondary | ICD-10-CM | POA: Diagnosis not present

## 2014-11-24 ENCOUNTER — Ambulatory Visit
Admission: RE | Admit: 2014-11-24 | Discharge: 2014-11-24 | Disposition: A | Discharge status: Home / Self Care | Payer: Medicare Other | Source: Ambulatory Visit | Attending: Radiation Oncology | Admitting: Radiation Oncology

## 2014-11-24 ENCOUNTER — Encounter: Payer: Self-pay | Admitting: Radiation Oncology

## 2014-11-24 VITALS — BP 159/87 | HR 78 | Temp 98.7°F | Resp 12

## 2014-11-24 DIAGNOSIS — C61 Malignant neoplasm of prostate: Secondary | ICD-10-CM | POA: Diagnosis not present

## 2014-11-24 DIAGNOSIS — Z510 Encounter for antineoplastic radiation therapy: Secondary | ICD-10-CM | POA: Diagnosis not present

## 2014-11-24 NOTE — Progress Notes (Signed)
He is currently in no pain.  Pt reports urinary frequency, hesistency and hot flashes. Pt states they urinate 3 - 4 times per night.  Pt reports soft bowel movement every other day.  BP 159/87 mmHg  Pulse 78  Temp(Src) 98.7 F (37.1 C) (Oral)  Resp 12  SpO2 100%

## 2014-11-24 NOTE — Progress Notes (Signed)
Weekly Management Note:  Site: Prostate Current Dose:  7215  cGy Projected Dose: 7800  cGy  Narrative: The patient is seen today for routine under treatment assessment. CBCT/MVCT images/port films were reviewed. The chart was reviewed.   Bladder filling is satisfactory.  No new GU or GI difficulties.  Physical Examination:  Filed Vitals:   11/24/14 1129  BP: 159/87  Pulse: 78  Temp: 98.7 F (37.1 C)  Resp: 12  .  Weight:  .  No change.  Impression: Tolerating radiation therapy well.  He will finish his radiation therapy this Wednesday.  Plan: Continue radiation therapy as planned.  One-month follow-up visit after completion of radiation therapy.

## 2014-11-25 ENCOUNTER — Ambulatory Visit
Admission: RE | Admit: 2014-11-25 | Discharge: 2014-11-25 | Disposition: A | Discharge status: Home / Self Care | Payer: Medicare Other | Source: Ambulatory Visit | Attending: Radiation Oncology | Admitting: Radiation Oncology

## 2014-11-25 DIAGNOSIS — Z510 Encounter for antineoplastic radiation therapy: Secondary | ICD-10-CM | POA: Diagnosis not present

## 2014-11-25 DIAGNOSIS — C61 Malignant neoplasm of prostate: Secondary | ICD-10-CM | POA: Diagnosis not present

## 2014-11-26 ENCOUNTER — Ambulatory Visit
Admission: RE | Admit: 2014-11-26 | Discharge: 2014-11-26 | Disposition: A | Discharge status: Home / Self Care | Payer: Medicare Other | Source: Ambulatory Visit | Attending: Radiation Oncology | Admitting: Radiation Oncology

## 2014-11-26 DIAGNOSIS — Z510 Encounter for antineoplastic radiation therapy: Secondary | ICD-10-CM | POA: Diagnosis not present

## 2014-11-26 DIAGNOSIS — C61 Malignant neoplasm of prostate: Secondary | ICD-10-CM | POA: Diagnosis not present

## 2014-11-27 ENCOUNTER — Encounter: Payer: Self-pay | Admitting: Radiation Oncology

## 2014-11-27 NOTE — Progress Notes (Signed)
[facility] End of Treatment Note  Name:Hill C Brookover  Date: 11/27/2014 UZH:[id number] DOB:10-01-1946   Status:outpatient    CC: Henrine Screws, MD , Dr. Rana Snare  REFERRING PHYSICIAN: Dr. 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 Neas tolerated his treatment well with no significant GU or GI toxicity during his course of therapy.  He had occasional hot flashes related to his short-term androgen deprivation therapy.                      PLAN: Routine followup in one month. Patient instructed to call if questions or worsening complaints in interim.

## 2015-02-12 DIAGNOSIS — C61 Malignant neoplasm of prostate: Secondary | ICD-10-CM | POA: Diagnosis not present

## 2015-02-16 DIAGNOSIS — C61 Malignant neoplasm of prostate: Secondary | ICD-10-CM | POA: Diagnosis not present

## 2015-06-15 DIAGNOSIS — C61 Malignant neoplasm of prostate: Secondary | ICD-10-CM | POA: Diagnosis not present

## 2015-06-19 DIAGNOSIS — C61 Malignant neoplasm of prostate: Secondary | ICD-10-CM | POA: Diagnosis not present

## 2015-06-19 DIAGNOSIS — N138 Other obstructive and reflux uropathy: Secondary | ICD-10-CM | POA: Diagnosis not present

## 2015-06-19 DIAGNOSIS — N401 Enlarged prostate with lower urinary tract symptoms: Secondary | ICD-10-CM | POA: Diagnosis not present

## 2015-09-15 DIAGNOSIS — L821 Other seborrheic keratosis: Secondary | ICD-10-CM | POA: Diagnosis not present

## 2015-09-15 DIAGNOSIS — L570 Actinic keratosis: Secondary | ICD-10-CM | POA: Diagnosis not present

## 2015-09-15 DIAGNOSIS — L219 Seborrheic dermatitis, unspecified: Secondary | ICD-10-CM | POA: Diagnosis not present

## 2015-10-02 DIAGNOSIS — C61 Malignant neoplasm of prostate: Secondary | ICD-10-CM | POA: Diagnosis not present

## 2015-10-14 ENCOUNTER — Ambulatory Visit: Payer: Medicare Other | Admitting: Diagnostic Neuroimaging

## 2015-10-15 ENCOUNTER — Encounter: Payer: Self-pay | Admitting: Diagnostic Neuroimaging

## 2015-10-21 DIAGNOSIS — Z8546 Personal history of malignant neoplasm of prostate: Secondary | ICD-10-CM | POA: Diagnosis not present

## 2015-11-17 ENCOUNTER — Ambulatory Visit (INDEPENDENT_AMBULATORY_CARE_PROVIDER_SITE_OTHER): Payer: Medicare Other | Admitting: Diagnostic Neuroimaging

## 2015-11-17 ENCOUNTER — Encounter: Payer: Self-pay | Admitting: Diagnostic Neuroimaging

## 2015-11-17 VITALS — BP 142/79 | HR 83 | Ht 71.0 in | Wt 209.4 lb

## 2015-11-17 DIAGNOSIS — G40909 Epilepsy, unspecified, not intractable, without status epilepticus: Secondary | ICD-10-CM

## 2015-11-17 DIAGNOSIS — I63113 Cerebral infarction due to embolism of bilateral vertebral arteries: Secondary | ICD-10-CM

## 2015-11-17 NOTE — Patient Instructions (Signed)
Thank you for coming to see Connor [name] at [facility]. I hope we have been able to provide you high quality care today.  You may receive a patient satisfaction survey over the next few weeks. We would appreciate your feedback and comments so that we may continue to improve ourselves and the health of our patients.  - continue current medications   ~~~~~~~~~~~~~~~~~~~~~~~~~~~~~~~~~~~~~~~~~~~~~~~~~~~~~~~~~~~~~~~~~  DR. PENUMALLI'S GUIDE TO HAPPY AND HEALTHY LIVING These are some of my general health and wellness recommendations. Some of them may apply to you better than others. Please use common sense as you try these suggestions and feel free to ask me any questions.   ACTIVITY/FITNESS Mental, social, emotional and physical stimulation are very important for brain and body health. Try learning a new activity (arts, music, language, sports, games).  Keep moving your body to the best of your abilities. You can do this at home, inside or outside, the park, community center, gym or anywhere you like. Consider a physical therapist or personal trainer to get started. Consider the app Sworkit. Fitness trackers such as smart-watches, smart-phones or Fitbits can help as well.   NUTRITION Eat more plants: colorful vegetables, nuts, seeds and berries.  Eat less sugar, salt, preservatives and processed foods.  Avoid toxins such as cigarettes and alcohol.  Drink water when you are thirsty. Warm water with a slice of lemon is an excellent morning drink to start the day.  Consider these websites for more information The Nutrition Source (https://www.henry-hernandez.biz/) Precision Nutrition (WindowBlog.ch)   RELAXATION Consider practicing mindfulness meditation or other relaxation techniques such as deep breathing, prayer, yoga, tai chi, massage. See website mindful.org or the apps Headspace or Calm to help get started.   SLEEP Try to get at  least 7-8+ hours sleep per day. Regular exercise and reduced caffeine will help you sleep better. Practice good sleep hygeine techniques. See website sleep.org for more information.   PLANNING Prepare estate planning, living will, healthcare POA documents. Sometimes this is best planned with the help of an attorney. Theconversationproject.org and agingwithdignity.org are excellent resources.;th

## 2015-11-17 NOTE — Progress Notes (Signed)
[facility]  PATIENT: Connor [name] DOB: 03-06-1946  REFERRING CLINICIAN:  HISTORY FROM: patient  REASON FOR VISIT: follow up (transfer, [name])   HISTORICAL  CHIEF COMPLAINT:  Chief Complaint  Patient presents with  . Embolic stroke    rm 7  . Follow-up    1 year    HISTORY OF PRESENT ILLNESS:   UPDATE 11/17/15: Since last visit, doing well. No seizures. Some mild balance issues across open parking lots, but no major falls or problems. Completed prostate radiation.   UPDATE 10/13/14: Since last visit, now on prostate CA therapy (radiation and hormone). No seizures. No new stroke like events. Overall stable neurologically.   UPDATE 10/11/13: Since last visit, stable. No seizures since Dec 2012. Still with some balance difficulty. Tolerating LTG.  PRIOR HPI (12/17/11, [place] [name]): 70 year old right-handed white divorced male with a history of episodes in which his mind would suddenly blankout beginning in 1984.  It would be an unusual sensation and during Connor episodes he could not respond. He had an episode while on a plane trip to [name] 10/1992 with a weird sensation and then a generalized major motor seizure.  Connor plane landed in [place]  and he was evaluated at [place] [name]. CAT scan without dye, EKG, CBC, SMA 6, SMA 12, magnesium level were normal and  he was placed on Dilantin 300 mg per day. A second generalized major motor seizure occurred 04/1993 and  I first saw him 05/31/1993. He was drinking 42 beers per week. Because Connor seizures were simple partial and complex partial with secondary generalization. It was felt his seizures might not  be related to alcohol. 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 He was placed on Dilantin medication and subsequently switched to Lamictal in 2006. He  tried to taper lamotrigine several times and  had generalized major motor seizures. This occurred in  2009. He had a CAT scan for evaluation and a bone density scan 12/06/2004. Connor foreign body was removed from Connor right orbit . He previously had an MRI of his lumbar spine 12/13/2006 showing severe canal stenosis at L4-5 anterior listhesis of 4-5 mm and severe facet hypertrophy and a large synovial cyst emanating from Connor left facet joint. There were also degenerative changes on Connor right at L5-S1 and facet hypertrophy at L5-S1. There was  disc bulging at L3-4.  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 He underwent radiation therapy and chemotherapy at [place] [name]. CT of Connor neck with contrast 07/12/11 showed no evidence of recurrent  disease. 7/12 he had a nocturnal generalized seizure. He awoke at night having bitten his tongue.  He had been trying to taper lamotrigine. 12/13/10 while on a [place] trip in [place] and drinking beer regularly during Connor  Connor day he had a seizure. 08/29/11 he had  another seizure.  In November, he  had persistent left-sided headache occurring on a daily basis. Wednesday 09/28/11, he was eating breakfast with friends in a restaurant and noted Connor onset of Connor left sided headache with numbness in Connor left forehead extending down Connor left side of his face and then involving his entire body with a  tingling sensation or numbness,worse on Connor left side of  his body than Connor right. He became weak in both arms and both legs and slumped in his chair.  911 was called and he was seen at [name] emergency room. CT scan of Connor brain without contrast was unremarkable and MRI study of Connor brain without and with contrast which was reviewed by Dr. Patton Salles was negative for acute stroke. At home he noted falling to his left, double vision, and oscillopsia. He did  have hiccups. 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  CT angiogram showed no evidence of posterior circulation stenosis or occlusion and TEE showed normal EF and no emboli source. In [facility] hospital he had a seizure and Lamictal was increas to 100 mg AM and 200 mg PM.He was improving  but 2/9 after having one beer he noticed dizziness and blurred vision  with vertigo and decreased balance. He was seen in [place] [name] 12/04/11 MRI showing no evidence of acute stroke. He has slowly improved. He denies palpitations. He has spinning, double vision, hiccups,numbness left face and right body, and balance problems. He denies auras of seizures.  REVIEW OF SYSTEMS: Full 14 system review of systems performed and notable only for double vision eye pain trouble swallowing gait diff memory loss.   ALLERGIES: Allergies  Allergen Reactions  . Morphine And Related Nausea And Vomiting    HOME MEDICATIONS: Outpatient Prescriptions Prior to Visit  Medication Sig Dispense Refill  . amLODipine-valsartan (EXFORGE) 5-160 MG per tablet Take 1 tablet by mouth every morning.     Marland Kitchen aspirin 81 MG tablet Take 81 mg by mouth daily.    Marland Kitchen lamoTRIgine (LAMICTAL) 100 MG tablet Take 100-200 mg by mouth 2 (two) times daily. Takes 2 in Connor morning and 1 at night    . simvastatin (ZOCOR) 20 MG tablet Take 20 mg by mouth every evening.     No facility-administered medications prior to visit.    PAST MEDICAL HISTORY: Past Medical History  Diagnosis Date  . Head and neck cancer ~ 2009    S/P radiation & [name]  . Hypertension   . Chronic kidney disease   . Hyperlipidemia   . Stroke ([place]) [name] 2013    UNABLE TO SPEAK OR MOVE AND RT SIDE WEAKNESS AND LOSS OF SKIN SENSITIVITY TO HEAT AND COLD ON RT SIDE, DOUBLE VISION. BALANCE PROBLEMS---STATES STILL HAS DOUBLE VISION AND BALANCE PROBLEM AND RT SIDED LOSS OF SKIN SENSITIVITY  . Headache(784.0)   . Arthritis     OA AND PAIN  RT KNEE  . Radiation 2015    hx of, prostate cancer  . Seizures ([place])     hx of x yrs, "Connor bad kind;bite tongue; STATES LAST [place] 2013; WAS SEEING [name] - HE RETIRED AND PT LAST SAW DR. [name]  . Cancer ([place])     h/o neck - ABOUT 6 YRS AGO - TX'D WITH RADIATION AND CHEMO   . Kidney carcinoma ([place])     h/o - NEPHRECTOMY   . Prostate cancer ([place]) 05/20/14    Gleason 4+3=7, volume 25 gm    PAST Connor HISTORY: Past Connor History  Procedure Laterality Date  . Nephrectomy  1990's    left  . Total knee arthroplasty  2011    left  . Hydrocelectomy  11/2000    left  . Tee without cardioversion  10/04/2011    Procedure: TRANSESOPHAGEAL ECHOCARDIOGRAM (TEE);  Surgeon: Candee Furbish, MD;  Location: [name];  Service: Cardiovascular;  Laterality: N/A;  . Joint replacement      LEFT TOTAL KNEE ARTHROPLASTY  . Total knee arthroplasty Right 02/24/2014    Procedure: RIGHT TOTAL KNEE ARTHROPLASTY;  Surgeon: Gearlean Alf, MD;  Location: [facility];  Service: Orthopedics;  Laterality: Right;  . Prostate biopsy  05/20/14    Gleason 4+3=7, vol 25 gm    FAMILY HISTORY: Family History  Problem Relation Age of Onset  . Diabetes Mother   . Heart disease Mother   . Heart disease Father     SOCIAL HISTORY:  Social History   Social History  . Marital Status: Divorced    Spouse Name: N/A  . Number of Children: 2  . Years of Education: 12th   Occupational History  . owner Other    [employer]   Social History Main Topics  . Smoking status: Never Smoker   . Smokeless tobacco: Never Used  . Alcohol Use: 0.0 oz/week    0 Standard drinks or equivalent per week     Comment: WAS 6 TO 8 BEERS DAILY   . Drug Use: No  . Sexual Activity: Not Currently   Other Topics Concern  . Not on file   Social History Narrative   Patient is divorced with 2 children.   Patient is right handed.   Patient has hs education.   Patient drinks 2 cups daily.       PHYSICAL EXAM  Filed Vitals:   11/17/15 1500  BP: 142/79  Pulse: 83  Height: 5\' 11"  (1.803 m)  Weight: 209 lb 6.4 oz (94.983 kg)    Not recorded      Body mass index is 29.22 kg/(m^2).  GENERAL EXAM: Patient is in no distress; well developed, nourished and groomed; neck is supple  CARDIOVASCULAR: Regular rate and rhythm, no murmurs, no carotid bruits  NEUROLOGIC: MENTAL STATUS: awake, alert, oriented to person, place and time, recent and remote memory intact, normal attention and concentration, language fluent, comprehension intact, naming intact, fund of knowledge appropriate CRANIAL NERVE: no papilledema on fundoscopic exam, pupils reactive (RIGHT 3, LEFT 2), LEFT PTOSIS, visual fields full to confrontation, extraocular muscles intact, BLURRED VISION IN RIGHT OR LEFT GAZE. Facial sensation SYMM; facial strength symmetric, hearing intact, palate elevates symmetrically, uvula midline, shoulder shrug symmetric, TONGUE DEVIATES TO Connor LEFT. MOTOR: normal bulk and tone, full strength in Connor BUE, BLE SENSORY: DECR IN RIGHT ARM/LEG TO TEMP. VIB NORMAL. COORDINATION: DYSMETRIA IN LUE > RUE. REFLEXES: deep tendon reflexes present and symmetric GAIT/STATION: SLIGHTLY SLOW GAIT.    DIAGNOSTIC DATA (LABS, IMAGING, TESTING) - I reviewed patient records, labs, notes, testing and imaging myself where available.  Lab Results  Component Value Date   WBC 15.9* 02/26/2014   HGB 8.3* 02/26/2014   HCT 24.1* 02/26/2014   MCV 87.3 02/26/2014   PLT 196 02/26/2014      Component Value Date/Time   NA 130* 02/26/2014 0512   K 4.2 02/26/2014 0512   CL 96 02/26/2014 0512   CO2 25 02/26/2014 0512   GLUCOSE 144* 02/26/2014 0512   BUN 16 02/26/2014 0512   CREATININE 1.03 02/26/2014 0512   CALCIUM 8.5 02/26/2014 0512   PROT 8.3 02/17/2014 1350   ALBUMIN 4.4 02/17/2014 1350   AST 31 02/17/2014 1350   ALT 32 02/17/2014 1350   ALKPHOS 87 02/17/2014 1350   BILITOT  0.4 02/17/2014 1350    GFRNONAA 74* 02/26/2014 0512   GFRAA 85* 02/26/2014 0512   Lab Results  Component Value Date   CHOL 149 12/05/2011   HDL 56 12/05/2011   LDLCALC 68 12/05/2011   TRIG 123 12/05/2011   CHOLHDL 2.7 12/05/2011   Lab Results  Component Value Date   HGBA1C 5.3 12/05/2011   No results found for: [id number] Lab Results  Component Value Date   TSH 1.207 12/05/2011    12/05/11 MRI brain 1. No acute intracranial abnormality.  2. Subtle left medullary and right inferior cerebellar encephalomalacia corresponding to Connor December infarcts.   12/07/11 MRA head  - Mildly degraded by motion artifact. Negative intracranial MRA.  10/04/11 TEE  - No cardiac source of emboli was indentified.    ASSESSMENT AND PLAN  70 y.o. year old male here with history of seizure since 17. 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 Overall stable. Last seizure in 2012.   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    Penni Bombard, MD [id number], 0000000 PM Certified in Neurology, Neurophysiology and Neuroimaging  [facility] [address] [place], [place] [address] [phone]

## 2015-11-23 DIAGNOSIS — I679 Cerebrovascular disease, unspecified: Secondary | ICD-10-CM | POA: Diagnosis not present

## 2015-11-23 DIAGNOSIS — Z0001 Encounter for general adult medical examination with abnormal findings: Secondary | ICD-10-CM | POA: Diagnosis not present

## 2015-11-23 DIAGNOSIS — R569 Unspecified convulsions: Secondary | ICD-10-CM | POA: Diagnosis not present

## 2015-11-23 DIAGNOSIS — E559 Vitamin D deficiency, unspecified: Secondary | ICD-10-CM | POA: Diagnosis not present

## 2015-11-23 DIAGNOSIS — I2584 Coronary atherosclerosis due to calcified coronary lesion: Secondary | ICD-10-CM | POA: Diagnosis not present

## 2015-11-23 DIAGNOSIS — M179 Osteoarthritis of knee, unspecified: Secondary | ICD-10-CM | POA: Diagnosis not present

## 2015-11-23 DIAGNOSIS — I10 Essential (primary) hypertension: Secondary | ICD-10-CM | POA: Diagnosis not present

## 2015-11-23 DIAGNOSIS — E782 Mixed hyperlipidemia: Secondary | ICD-10-CM | POA: Diagnosis not present

## 2015-11-23 DIAGNOSIS — Z23 Encounter for immunization: Secondary | ICD-10-CM | POA: Diagnosis not present

## 2015-11-23 DIAGNOSIS — G8194 Hemiplegia, unspecified affecting left nondominant side: Secondary | ICD-10-CM | POA: Diagnosis not present

## 2015-11-23 DIAGNOSIS — Z79899 Other long term (current) drug therapy: Secondary | ICD-10-CM | POA: Diagnosis not present

## 2015-11-23 DIAGNOSIS — J0410 Acute tracheitis without obstruction: Secondary | ICD-10-CM | POA: Diagnosis not present

## 2015-11-23 DIAGNOSIS — R7309 Other abnormal glucose: Secondary | ICD-10-CM | POA: Diagnosis not present

## 2016-04-04 DIAGNOSIS — Z8546 Personal history of malignant neoplasm of prostate: Secondary | ICD-10-CM | POA: Diagnosis not present

## 2016-04-11 DIAGNOSIS — C61 Malignant neoplasm of prostate: Secondary | ICD-10-CM | POA: Diagnosis not present

## 2016-05-24 DIAGNOSIS — H25813 Combined forms of age-related cataract, bilateral: Secondary | ICD-10-CM | POA: Diagnosis not present

## 2016-08-08 DIAGNOSIS — C61 Malignant neoplasm of prostate: Secondary | ICD-10-CM | POA: Diagnosis not present

## 2016-08-15 DIAGNOSIS — C61 Malignant neoplasm of prostate: Secondary | ICD-10-CM | POA: Diagnosis not present

## 2016-11-14 DIAGNOSIS — C61 Malignant neoplasm of prostate: Secondary | ICD-10-CM | POA: Diagnosis not present

## 2016-11-16 ENCOUNTER — Ambulatory Visit: Payer: Medicare Other | Admitting: Diagnostic Neuroimaging

## 2016-11-17 ENCOUNTER — Encounter: Payer: Self-pay | Admitting: Diagnostic Neuroimaging

## 2016-11-17 ENCOUNTER — Ambulatory Visit: Payer: Medicare Other | Admitting: Diagnostic Neuroimaging

## 2016-11-21 DIAGNOSIS — C61 Malignant neoplasm of prostate: Secondary | ICD-10-CM | POA: Diagnosis not present

## 2017-01-16 DIAGNOSIS — H903 Sensorineural hearing loss, bilateral: Secondary | ICD-10-CM | POA: Diagnosis not present

## 2017-02-01 ENCOUNTER — Encounter: Payer: Self-pay | Admitting: Diagnostic Neuroimaging

## 2017-02-01 ENCOUNTER — Ambulatory Visit (INDEPENDENT_AMBULATORY_CARE_PROVIDER_SITE_OTHER): Payer: PPO | Admitting: Diagnostic Neuroimaging

## 2017-02-01 ENCOUNTER — Encounter (INDEPENDENT_AMBULATORY_CARE_PROVIDER_SITE_OTHER): Payer: Self-pay

## 2017-02-01 VITALS — BP 162/90 | HR 72 | Ht 71.0 in | Wt 211.6 lb

## 2017-02-01 DIAGNOSIS — G252 Other specified forms of tremor: Secondary | ICD-10-CM

## 2017-02-01 DIAGNOSIS — G40909 Epilepsy, unspecified, not intractable, without status epilepticus: Secondary | ICD-10-CM | POA: Diagnosis not present

## 2017-02-01 DIAGNOSIS — I63113 Cerebral infarction due to embolism of bilateral vertebral arteries: Secondary | ICD-10-CM

## 2017-02-01 MED ORDER — LAMOTRIGINE 100 MG PO TABS: ORAL_TABLET | ORAL | 4 refills | Status: DC

## 2017-02-01 NOTE — Progress Notes (Signed)
[facility]  PATIENT: Connor [name] DOB: 01-09-1946  REFERRING CLINICIAN:  HISTORY FROM: patient  REASON FOR VISIT: follow up (former [name] pt)   HISTORICAL  CHIEF COMPLAINT:  Chief Complaint  Patient presents with  . Follow-up    L hearing aide not working today.   . Seizures    none.  Takes lamictal 1 in AM and 2 in PM  . Hx Stroke    HISTORY OF PRESENT ILLNESS:   UPDATE 02/01/17: Since last visit, PSA has gone up and may need more treatment. No seizures. Has noticed fine tremor in bilateral hands x 6-8 months. Paternal grandfather and uncle had parkinsons.   UPDATE 11/17/15: Since last visit, doing well. No seizures. Some mild balance issues across open parking lots, but no major falls or problems. Completed prostate radiation.   UPDATE 10/13/14: Since last visit, now on prostate CA therapy (radiation and hormone). No seizures. No new stroke like events. Overall stable neurologically.   UPDATE 10/11/13: Since last visit, stable. No seizures since Dec 2012. Still with some balance difficulty. Tolerating LTG.  PRIOR HPI (12/17/11, [name]): 71 year old right-handed white divorced male with a history of episodes in which his mind would suddenly blankout beginning in 1984.  It would be an unusual sensation and during the episodes he could not respond. He had an episode while on a plane trip to [facility] 10/1992 with a weird sensation and then a generalized major motor seizure.  The plane landed in [place]  and he was evaluated at the [facility]. CAT scan without dye, EKG, CBC, SMA 6, SMA 12, magnesium level were normal and  he was placed on Dilantin 300 mg per day. A second generalized major motor seizure occurred 04/1993 and  I first saw him 05/31/1993. He was drinking 42 beers per week. Because the seizures were simple partial and complex partial with secondary generalization. It was felt his seizures might not  be related to alcohol. 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 He was placed on Dilantin medication and subsequently switched to Lamictal in 2006. He  tried to taper lamotrigine several times and  had generalized major motor seizures. This occurred in 2009. He had a CAT scan for evaluation and a bone density scan 12/06/2004. The foreign body was removed from the right orbit . He previously had an MRI of his lumbar spine 12/13/2006 showing severe canal stenosis at L4-5 anterior listhesis of 4-5 mm and severe facet hypertrophy and a large synovial cyst emanating from the left facet joint. There were also degenerative changes on the right at L5-S1 and facet hypertrophy at L5-S1. There was  disc bulging at L3-4.  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 He underwent radiation therapy and chemotherapy at [facility]. CT of the neck with contrast 07/12/11 showed no evidence of recurrent  disease. 7/12 he had a nocturnal generalized seizure. He awoke at night having bitten his tongue.  He had been trying to taper lamotrigine. 12/13/10 while [place] the [place] and drinking beer regularly during the  the day he had a seizure. 08/29/11 he had  another seizure.  In November, he  had persistent left-sided headache occurring on a daily basis.  Wednesday 09/28/11, he was eating breakfast with friends in a restaurant and noted the onset of the left sided headache with numbness in the left forehead extending down the left side of his face and then involving his entire body with a  tingling sensation or numbness,worse on the left side of his body than the right. He became weak in both arms and both legs and slumped in his chair.  911 was called and he was seen at [facility] emergency room. CT scan of the brain without contrast was  unremarkable and MRI study of the brain without and with contrast which was reviewed by Dr. Patton Salles was negative for acute stroke. At home he noted falling to his left, double vision, and oscillopsia. He did  have hiccups. 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  CT angiogram showed no evidence of posterior circulation stenosis or occlusion and TEE showed normal EF and no emboli source. In the hospital he had a seizure and Lamictal was increas to 100 mg AM and 200 mg PM.He was improving  but 2/9 after having one beer he noticed dizziness and blurred vision  with vertigo and decreased balance. He was seen in the [facility] ER 12/04/11 MRI showing no evidence of acute stroke. He has slowly improved. He denies palpitations. He has spinning, double vision, hiccups,numbness left face and right body, and balance problems. He denies auras of seizures.  REVIEW OF SYSTEMS: Full 14 system review of systems performed and notable only for double vision eye pain trouble swallowing gait diff memory loss.   ALLERGIES: Allergies  Allergen Reactions  . Morphine And Related Nausea And Vomiting    HOME MEDICATIONS: Outpatient Medications Prior to Visit  Medication Sig Dispense Refill  . amLODipine-valsartan (EXFORGE) 5-160 MG per tablet Take 1 tablet by mouth every morning.     Marland Kitchen aspirin 81 MG tablet Take 81 mg by mouth daily.    Marland Kitchen lamoTRIgine (LAMICTAL) 100 MG tablet Take 100-200 mg by mouth 2 (two) times daily. Takes one in AM and two in PM.    . simvastatin (ZOCOR) 20 MG tablet Take 20 mg by mouth every evening.     No facility-administered medications prior to visit.     PAST MEDICAL HISTORY: Past Medical History:  Diagnosis Date  . Arthritis    OA AND PAIN RT KNEE  . Cancer ([place])    h/o neck - ABOUT 6 YRS AGO - TX'D WITH RADIATION AND CHEMO   . Chronic kidney disease   . Head and neck cancer ~  2009   S/P radiation & [name]  . Headache(784.0)   . Hyperlipidemia   . Hypertension   . Kidney carcinoma ([place])    h/o - NEPHRECTOMY   . Prostate cancer ([place]) 05/20/14   Gleason 4+3=7, volume 25 gm  . Radiation 2015   hx of, prostate cancer  . Seizures ([place])    hx of x yrs, "the bad kind;bite tongue; STATES LAST [place] 2013; WAS SEEING [name] - HE RETIRED AND PT LAST SAW DR. [name]  . Stroke ([place]) [place] 2013   UNABLE TO SPEAK OR MOVE AND RT SIDE WEAKNESS AND LOSS OF SKIN SENSITIVITY TO HEAT AND COLD ON RT SIDE, DOUBLE VISION. BALANCE PROBLEMS---STATES STILL HAS DOUBLE VISION AND BALANCE PROBLEM AND RT SIDED LOSS OF SKIN SENSITIVITY    PAST  SURGICAL HISTORY: Past Surgical History:  Procedure Laterality Date  . hydrocelectomy  11/2000   left  . JOINT REPLACEMENT     LEFT TOTAL KNEE ARTHROPLASTY  . NEPHRECTOMY  1990's   left  . PROSTATE BIOPSY  05/20/14   Gleason 4+3=7, vol 25 gm  . TEE WITHOUT CARDIOVERSION  10/04/2011   Procedure: TRANSESOPHAGEAL ECHOCARDIOGRAM (TEE);  Surgeon: Candee Furbish, MD;  Location: [facility] ENDOSCOPY;  Service: Cardiovascular;  Laterality: N/A;  . TOTAL KNEE ARTHROPLASTY  2011   left  . TOTAL KNEE ARTHROPLASTY Right 02/24/2014   Procedure: RIGHT TOTAL KNEE ARTHROPLASTY;  Surgeon: Gearlean Alf, MD;  Location: [facility];  Service: Orthopedics;  Laterality: Right;    FAMILY HISTORY: Family History  Problem Relation Age of Onset  . Diabetes Mother   . Heart disease Mother   . Heart disease Father     SOCIAL HISTORY:  Social History   Social History  . Marital status: Divorced    Spouse name: N/A  . Number of children: 2  . Years of education: 12th   Occupational History  . owner Other    [employer]   Social History Main Topics  . Smoking status: Never Smoker  . Smokeless tobacco: Never Used  . Alcohol use 0.0 oz/week     Comment: WAS 6 TO 8 BEERS DAILY   . Drug use: No  . Sexual activity:  Not Currently   Other Topics Concern  . Not on file   Social History Narrative   Patient is divorced with 2 children.   Patient is right handed.   Patient has hs education.   Patient drinks 2 cups daily.     PHYSICAL EXAM  Vitals:   02/01/17 1109  BP: (!) 162/90  Pulse: 72  Weight: 211 lb 9.6 oz (96 kg)  Height: 5\' 11"  (1.803 m)    Not recorded      Body mass index is 29.51 kg/m.  GENERAL EXAM: Patient is in no distress; well developed, nourished and groomed; neck is supple  CARDIOVASCULAR: Regular rate and rhythm, no murmurs, no carotid bruits  NEUROLOGIC: MENTAL STATUS: awake, alert, oriented to person, place and time, recent and remote memory intact, normal attention and concentration, language fluent, comprehension intact, naming intact, fund of knowledge appropriate CRANIAL NERVE: no papilledema on fundoscopic exam, pupils reactive (RIGHT 3, LEFT 2), LEFT PTOSIS, visual fields full to confrontation, extraocular muscles intact, BLURRED VISION IN RIGHT OR LEFT GAZE. Facial sensation SYMM; facial strength symmetric, hearing intact, palate elevates symmetrically, uvula midline, shoulder shrug symmetric, TONGUE DEVIATES TO THE LEFT. MILD HOARSE VOICE MOTOR: normal bulk and tone, full strength in the BUE, BLE; MILD POSTURAL TREMOR IN BUE SENSORY: DECR IN RIGHT ARM/LEG TO TEMP. VIB NORMAL. COORDINATION: DYSMETRIA IN LUE > RUE. REFLEXES: deep tendon reflexes present and symmetric GAIT/STATION: SLIGHTLY SLOW GAIT.    DIAGNOSTIC DATA (LABS, IMAGING, TESTING) - I reviewed patient records, labs, notes, testing and imaging myself where available.  Lab Results  Component Value Date   WBC 15.9 (H) 02/26/2014   HGB 8.3 (L) 02/26/2014   HCT 24.1 (L) 02/26/2014   MCV 87.3 02/26/2014   PLT 196 02/26/2014      Component Value Date/Time   NA 130 (L) 02/26/2014 0512   K 4.2 02/26/2014 0512   CL 96 02/26/2014 0512   CO2 25 02/26/2014 0512   GLUCOSE 144 (H) 02/26/2014 0512    BUN 16 02/26/2014 0512   CREATININE 1.03 02/26/2014 7893  CALCIUM 8.5 02/26/2014 0512   PROT 8.3 02/17/2014 1350   ALBUMIN 4.4 02/17/2014 1350   AST 31 02/17/2014 1350   ALT 32 02/17/2014 1350   ALKPHOS 87 02/17/2014 1350   BILITOT 0.4 02/17/2014 1350   GFRNONAA 74 (L) 02/26/2014 0512   GFRAA 85 (L) 02/26/2014 0512   Lab Results  Component Value Date   CHOL 149 12/05/2011   HDL 56 12/05/2011   LDLCALC 68 12/05/2011   TRIG 123 12/05/2011   CHOLHDL 2.7 12/05/2011   Lab Results  Component Value Date   HGBA1C 5.3 12/05/2011   No results found for: [id number] Lab Results  Component Value Date   TSH 1.207 12/05/2011    12/05/11 MRI brain 1. No acute intracranial abnormality.  2. Subtle left medullary and right inferior cerebellar encephalomalacia corresponding to the December infarcts.   12/07/11 MRA head  - Mildly degraded by motion artifact. Negative intracranial MRA.  10/04/11 TEE  - No cardiac source of emboli was indentified.    ASSESSMENT AND PLAN  71 y.o. year old male here with history of seizure since 38. 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 Overall stable. Last seizure in 2012. 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 lamoTRIgine (LAMICTAL) 100 MG tablet    Sig: 100mg  in AM and 200mg  in PM    Dispense:  270 tablet    Refill:  4   Return in about 1 year (around 02/01/2018).    Penni Bombard, MD 12/07/863, 78:46 AM Certified in Neurology,  Neurophysiology and Neuroimaging  [facility] [address] [place], [place] [address] [phone]

## 2017-03-06 DIAGNOSIS — I10 Essential (primary) hypertension: Secondary | ICD-10-CM | POA: Diagnosis not present

## 2017-03-06 DIAGNOSIS — Z1389 Encounter for screening for other disorder: Secondary | ICD-10-CM | POA: Diagnosis not present

## 2017-03-06 DIAGNOSIS — Z23 Encounter for immunization: Secondary | ICD-10-CM | POA: Diagnosis not present

## 2017-03-06 DIAGNOSIS — Z79899 Other long term (current) drug therapy: Secondary | ICD-10-CM | POA: Diagnosis not present

## 2017-03-06 DIAGNOSIS — G8194 Hemiplegia, unspecified affecting left nondominant side: Secondary | ICD-10-CM | POA: Diagnosis not present

## 2017-03-06 DIAGNOSIS — R51 Headache: Secondary | ICD-10-CM | POA: Diagnosis not present

## 2017-03-06 DIAGNOSIS — E559 Vitamin D deficiency, unspecified: Secondary | ICD-10-CM | POA: Diagnosis not present

## 2017-03-06 DIAGNOSIS — I679 Cerebrovascular disease, unspecified: Secondary | ICD-10-CM | POA: Diagnosis not present

## 2017-03-06 DIAGNOSIS — Z0000 Encounter for general adult medical examination without abnormal findings: Secondary | ICD-10-CM | POA: Diagnosis not present

## 2017-03-06 DIAGNOSIS — E782 Mixed hyperlipidemia: Secondary | ICD-10-CM | POA: Diagnosis not present

## 2017-03-06 DIAGNOSIS — C649 Malignant neoplasm of unspecified kidney, except renal pelvis: Secondary | ICD-10-CM | POA: Diagnosis not present

## 2017-03-06 DIAGNOSIS — R7309 Other abnormal glucose: Secondary | ICD-10-CM | POA: Diagnosis not present

## 2017-03-06 DIAGNOSIS — I639 Cerebral infarction, unspecified: Secondary | ICD-10-CM | POA: Diagnosis not present

## 2017-03-06 DIAGNOSIS — C61 Malignant neoplasm of prostate: Secondary | ICD-10-CM | POA: Diagnosis not present

## 2017-03-06 DIAGNOSIS — I2584 Coronary atherosclerosis due to calcified coronary lesion: Secondary | ICD-10-CM | POA: Diagnosis not present

## 2017-03-16 ENCOUNTER — Other Ambulatory Visit (HOSPITAL_COMMUNITY): Payer: Self-pay | Admitting: Urology

## 2017-03-16 DIAGNOSIS — C61 Malignant neoplasm of prostate: Secondary | ICD-10-CM

## 2017-04-04 ENCOUNTER — Ambulatory Visit (HOSPITAL_COMMUNITY)
Admission: RE | Admit: 2017-04-04 | Discharge: 2017-04-04 | Disposition: A | Discharge status: Home / Self Care | Payer: PPO | Source: Ambulatory Visit | Attending: Urology | Admitting: Urology

## 2017-04-04 ENCOUNTER — Encounter (HOSPITAL_COMMUNITY)
Admission: RE | Admit: 2017-04-04 | Discharge: 2017-04-04 | Disposition: A | Discharge status: Home / Self Care | Payer: PPO | Source: Ambulatory Visit | Attending: Urology | Admitting: Urology

## 2017-04-04 DIAGNOSIS — C61 Malignant neoplasm of prostate: Secondary | ICD-10-CM | POA: Diagnosis not present

## 2017-04-04 DIAGNOSIS — K429 Umbilical hernia without obstruction or gangrene: Secondary | ICD-10-CM | POA: Diagnosis not present

## 2017-04-04 DIAGNOSIS — Z96653 Presence of artificial knee joint, bilateral: Secondary | ICD-10-CM | POA: Diagnosis not present

## 2017-04-04 DIAGNOSIS — Z471 Aftercare following joint replacement surgery: Secondary | ICD-10-CM | POA: Diagnosis not present

## 2017-04-04 MED ORDER — TECHNETIUM TC 99M MEDRONATE IV KIT
25.0000 | PACK | Freq: Once | INTRAVENOUS | Status: AC | PRN
  Administered 2017-04-04: 20.8 via INTRAVENOUS

## 2017-04-25 ENCOUNTER — Other Ambulatory Visit: Payer: Self-pay | Admitting: *Deleted

## 2017-04-25 NOTE — Patient Outreach (Signed)
[facility]) Care Management  04/25/2017  Connor [name] 30-Dec-1945 [id number]   Member identified as high risk according to [employer] health questionnaire.  Call placed to introduce [facility] care management services and perform telephone screening.  He report that he does have hypertension, in which he is taking medications for.  He state that it is not completely controlled at this time, but he does monitor on a regular basis.  He report that he has been recently diagnosed with prostate cancer and has been having daily treatments, state his blood pressure is also monitored during treatment.  [facility] care management services explained, offered to assist with management of hypertension, he declines, stating he feel he is too busy with his cancer treatments, but is open to receiving information in the mail for potential involvement in the future.  He also denies need for Education officer, museum or pharmacy involvement.  Will send information packet, but will not place referral or open case at this time.    [name], [place], MSN [place] [phone]

## 2017-05-04 ENCOUNTER — Encounter: Payer: Self-pay | Admitting: *Deleted

## 2017-05-17 DIAGNOSIS — C775 Secondary and unspecified malignant neoplasm of intrapelvic lymph nodes: Secondary | ICD-10-CM | POA: Diagnosis not present

## 2017-05-17 DIAGNOSIS — C61 Malignant neoplasm of prostate: Secondary | ICD-10-CM | POA: Diagnosis not present

## 2017-07-04 DIAGNOSIS — Z808 Family history of malignant neoplasm of other organs or systems: Secondary | ICD-10-CM | POA: Diagnosis not present

## 2017-07-04 DIAGNOSIS — C61 Malignant neoplasm of prostate: Secondary | ICD-10-CM | POA: Diagnosis not present

## 2017-07-04 DIAGNOSIS — C775 Secondary and unspecified malignant neoplasm of intrapelvic lymph nodes: Secondary | ICD-10-CM | POA: Diagnosis not present

## 2017-07-04 DIAGNOSIS — Z85528 Personal history of other malignant neoplasm of kidney: Secondary | ICD-10-CM | POA: Diagnosis not present

## 2017-07-04 DIAGNOSIS — Z8042 Family history of malignant neoplasm of prostate: Secondary | ICD-10-CM | POA: Diagnosis not present

## 2017-07-04 DIAGNOSIS — Z8546 Personal history of malignant neoplasm of prostate: Secondary | ICD-10-CM | POA: Diagnosis not present

## 2017-07-06 DIAGNOSIS — K1379 Other lesions of oral mucosa: Secondary | ICD-10-CM | POA: Diagnosis not present

## 2017-07-18 DIAGNOSIS — R221 Localized swelling, mass and lump, neck: Secondary | ICD-10-CM | POA: Diagnosis not present

## 2017-07-18 DIAGNOSIS — L0201 Cutaneous abscess of face: Secondary | ICD-10-CM | POA: Diagnosis not present

## 2017-07-18 DIAGNOSIS — K047 Periapical abscess without sinus: Secondary | ICD-10-CM | POA: Diagnosis not present

## 2017-07-18 DIAGNOSIS — R51 Headache: Secondary | ICD-10-CM | POA: Diagnosis not present

## 2017-07-19 DIAGNOSIS — K1379 Other lesions of oral mucosa: Secondary | ICD-10-CM | POA: Diagnosis not present

## 2017-07-25 DIAGNOSIS — K047 Periapical abscess without sinus: Secondary | ICD-10-CM | POA: Diagnosis not present

## 2017-07-25 DIAGNOSIS — B370 Candidal stomatitis: Secondary | ICD-10-CM | POA: Diagnosis not present

## 2017-07-26 DIAGNOSIS — K1379 Other lesions of oral mucosa: Secondary | ICD-10-CM | POA: Diagnosis not present

## 2017-08-10 DIAGNOSIS — C775 Secondary and unspecified malignant neoplasm of intrapelvic lymph nodes: Secondary | ICD-10-CM | POA: Diagnosis not present

## 2017-08-10 DIAGNOSIS — C61 Malignant neoplasm of prostate: Secondary | ICD-10-CM

## 2017-08-14 DIAGNOSIS — C61 Malignant neoplasm of prostate: Secondary | ICD-10-CM | POA: Diagnosis not present

## 2017-08-15 DIAGNOSIS — C775 Secondary and unspecified malignant neoplasm of intrapelvic lymph nodes: Secondary | ICD-10-CM | POA: Diagnosis not present

## 2017-08-15 DIAGNOSIS — C61 Malignant neoplasm of prostate: Secondary | ICD-10-CM | POA: Diagnosis not present

## 2017-08-18 DIAGNOSIS — R21 Rash and other nonspecific skin eruption: Secondary | ICD-10-CM | POA: Diagnosis not present

## 2017-08-18 DIAGNOSIS — M272 Inflammatory conditions of jaws: Secondary | ICD-10-CM | POA: Diagnosis not present

## 2017-08-21 DIAGNOSIS — M272 Inflammatory conditions of jaws: Secondary | ICD-10-CM | POA: Diagnosis not present

## 2017-08-23 DIAGNOSIS — M272 Inflammatory conditions of jaws: Secondary | ICD-10-CM | POA: Diagnosis not present

## 2017-08-29 ENCOUNTER — Telehealth: Payer: Self-pay

## 2017-08-29 ENCOUNTER — Other Ambulatory Visit: Payer: Self-pay | Admitting: Pharmacist

## 2017-08-29 ENCOUNTER — Encounter: Payer: Self-pay | Admitting: Internal Medicine

## 2017-08-29 ENCOUNTER — Ambulatory Visit (INDEPENDENT_AMBULATORY_CARE_PROVIDER_SITE_OTHER): Payer: PPO | Admitting: Internal Medicine

## 2017-08-29 VITALS — BP 130/81 | HR 87 | Temp 98.3°F | Wt 203.0 lb

## 2017-08-29 DIAGNOSIS — Z23 Encounter for immunization: Secondary | ICD-10-CM

## 2017-08-29 DIAGNOSIS — M272 Inflammatory conditions of jaws: Secondary | ICD-10-CM

## 2017-08-29 NOTE — Progress Notes (Signed)
RFV: new patient for mandible osteo  Patient ID: Connor [name], male   DOB: 1946-07-06, 71 y.o.   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 He also has metastatic prostate ca on lupron injection plus oral agent. who has 2 month history of left jaw pain and swelling. He has been under the care of oral surgeon who has had repeated I x D and teeth extraction plus short oral course with some improvement but not significant. Or cx showing prevotella. He has been on different course of abtx including clindamycin and most recently pen G plus metronidazole. He didn't tolerate abtx -   reviewed his records  Outpatient Encounter Medications as of 08/29/2017  Medication Sig  . amLODipine-valsartan (EXFORGE) 5-160 MG per tablet Take 1 tablet by mouth every morning.   Marland Kitchen aspirin 81 MG tablet Take 81 mg by mouth daily.  Marland Kitchen lamoTRIgine (LAMICTAL) 100 MG tablet 100mg  in AM and 200mg  in PM  . simvastatin (ZOCOR) 20 MG tablet Take 20 mg by mouth every evening.   No facility-administered encounter medications on file as of 08/29/2017.      Patient Active Problem List   Diagnosis Date Noted  . Malignant neoplasm of prostate ([place]) 06/24/2014  . Hyponatremia 02/26/2014  . Postoperative anemia due to acute blood loss 02/26/2014  . OA (osteoarthritis) of knee 02/24/2014  . Dizziness 12/04/2011  . Hyperlipidemia LDL goal < 100 10/05/2011  . 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 CVA (cerebral infarction) 10/05/2011  . 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 HTN (hypertension) 09/30/2011  . Seizure disorder ([place]) 09/30/2011     Health Maintenance Due  Topic Date Due  . Hepatitis C Screening  1946/06/03  . TETANUS/TDAP  02/10/1967  . COLONOSCOPY  02/09/1998  . PNA vac Low Risk Adult (1 of 2 - PCV13) 02/09/2013  . INFLUENZA VACCINE  05/24/2017     Review of  Systems  Physical Exam   BP 130/81   Pulse 87   Temp 98.3 F (36.8 C) (Oral)   Wt 203 lb (92.1 kg)   BMI 28.31 kg/m   Physical Exam  Constitutional: He is oriented to person, place, and time. He appears well-developed and well-nourished. No distress.  HENT: asymmetric swelling to left side Mouth/Throat: poor dentition. Left lower jaw ? Bone exposure Lymphadenopathy:  He has no cervical adenopathy.  Neurological: He is alert and oriented to person, place, and time.  Skin: Skin is warm and dry. No rash noted. No erythema.  Psychiatric: He has a normal mood and affect. His behavior is normal.    CBC Lab Results  Component Value Date   WBC 15.9 (H) 02/26/2014   RBC 2.76 (L) 02/26/2014   HGB 8.3 (L) 02/26/2014   HCT 24.1 (L) 02/26/2014   PLT 196 02/26/2014   MCV 87.3 02/26/2014   MCH 30.1 02/26/2014   MCHC 34.4 02/26/2014   RDW 13.1 02/26/2014   LYMPHSABS 1.3 10/06/2011   MONOABS 1.3 (H) 10/06/2011   EOSABS 0.2 10/06/2011    BMET Lab Results  Component Value Date   NA 130 (L) 02/26/2014   K 4.2 02/26/2014   CL 96 02/26/2014   CO2 25 02/26/2014   GLUCOSE 144 (H) 02/26/2014   BUN 16 02/26/2014   CREATININE 1.03 02/26/2014   CALCIUM 8.5 02/26/2014   GFRNONAA 74 (L) 02/26/2014   GFRAA 85 (L) 02/26/2014  Lab Results  Component Value Date   ESRSEDRATE 61 (H) 08/29/2017   Lab Results  Component Value Date   CRP 9.2 (H) 08/29/2017    Assessment and Plan  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

## 2017-08-29 NOTE — Telephone Encounter (Signed)
Per verbal order from Dr. Baxter Flattery I called Interventional Radiology to schedule an appt for a PICC line. I spoke with Anderson Malta who was able to schedule the pt for 08/31/17 at 12 pm. She stated that the pt must arrive by 11:30 to get checked in. After the call with Interventional Radiology I called Mr. Tsuda to inform him of his appt I told the pt that his appt was going to be on 08/31/17 at 12 pm and that he should arrive by 11:30 to get checked in. The pt repeated and confirmed my message before ending the call.  I faxed the order to [place] and Short Stay with the appt for Interventional Radiology. I called [place] and spoke with Loreta informed her of the pt's appt with Interventional Radiology and that the pt is to have her first dose of medication done at Short Stay she relayed and confirmed my message before ending the call. I also informed Short Stay spoke to Laverne to inform her of the pt's appt with IR on 08/31/17 at 12 pm and that the pt is to get first dose of iv medication at short stay. Laverne confirmed my message before ending the call.  [place]

## 2017-08-30 LAB — BASIC METABOLIC PANEL
BUN: 14 mg/dL (ref 7–25)
CALCIUM: 10 mg/dL (ref 8.6–10.3)
CO2: 31 mmol/L (ref 20–32)
CREATININE: 1.04 mg/dL (ref 0.70–1.25)
Chloride: 95 mmol/L — ABNORMAL LOW (ref 98–110)
Glucose, Bld: 116 mg/dL — ABNORMAL HIGH (ref 65–99)
POTASSIUM: 5.5 mmol/L — AB (ref 3.5–5.3)
Sodium: 134 mmol/L — ABNORMAL LOW (ref 135–146)

## 2017-08-30 LAB — CBC WITH DIFFERENTIAL/PLATELET
BASOS PCT: 0.4 %
Basophils Absolute: 39 cells/uL (ref 0–200)
EOS PCT: 1.8 %
Eosinophils Absolute: 175 cells/uL (ref 15–500)
HEMATOCRIT: 37.5 % — AB (ref 38.5–50.0)
Hemoglobin: 12.8 g/dL — ABNORMAL LOW (ref 13.2–17.1)
LYMPHS ABS: 2318 {cells}/uL (ref 850–3900)
MCH: 28.5 pg (ref 27.0–33.0)
MCHC: 34.1 g/dL (ref 32.0–36.0)
MCV: 83.5 fL (ref 80.0–100.0)
MPV: 8.9 fL (ref 7.5–12.5)
Monocytes Relative: 14.7 %
NEUTROS PCT: 59.2 %
Neutro Abs: 5742 cells/uL (ref 1500–7800)
PLATELETS: 379 10*3/uL (ref 140–400)
RBC: 4.49 10*6/uL (ref 4.20–5.80)
RDW: 12.9 % (ref 11.0–15.0)
TOTAL LYMPHOCYTE: 23.9 %
WBC: 9.7 10*3/uL (ref 3.8–10.8)
WBCMIX: 1426 {cells}/uL — AB (ref 200–950)

## 2017-08-30 LAB — C-REACTIVE PROTEIN: CRP: 9.2 mg/L — ABNORMAL HIGH (ref <8.0)

## 2017-08-30 LAB — SEDIMENTATION RATE: Sed Rate: 67 mm/h — ABNORMAL HIGH (ref 0–20)

## 2017-08-31 ENCOUNTER — Ambulatory Visit (HOSPITAL_COMMUNITY)
Admission: RE | Admit: 2017-08-31 | Discharge: 2017-08-31 | Disposition: A | Discharge status: Home / Self Care | Payer: PPO | Source: Ambulatory Visit | Attending: Internal Medicine | Admitting: Internal Medicine

## 2017-08-31 ENCOUNTER — Other Ambulatory Visit: Payer: Self-pay | Admitting: Internal Medicine

## 2017-08-31 ENCOUNTER — Encounter (HOSPITAL_COMMUNITY): Payer: Self-pay | Admitting: Physician Assistant

## 2017-08-31 ENCOUNTER — Encounter (HOSPITAL_COMMUNITY)
Admission: RE | Admit: 2017-08-31 | Discharge: 2017-08-31 | Disposition: A | Discharge status: Home / Self Care | Payer: PPO | Source: Ambulatory Visit | Attending: Internal Medicine | Admitting: Internal Medicine

## 2017-08-31 DIAGNOSIS — Z452 Encounter for adjustment and management of vascular access device: Secondary | ICD-10-CM | POA: Diagnosis not present

## 2017-08-31 DIAGNOSIS — M272 Inflammatory conditions of jaws: Secondary | ICD-10-CM | POA: Insufficient documentation

## 2017-08-31 DIAGNOSIS — M869 Osteomyelitis, unspecified: Secondary | ICD-10-CM | POA: Diagnosis not present

## 2017-08-31 HISTORY — PX: IR FLUORO GUIDE CV LINE RIGHT: IMG2283

## 2017-08-31 HISTORY — PX: IR US GUIDE VASC ACCESS RIGHT: IMG2390

## 2017-08-31 MED ORDER — HEPARIN SOD (PORK) LOCK FLUSH 100 UNIT/ML IV SOLN
INTRAVENOUS | Status: AC
  Administered 2017-08-31: 250 [IU]
  Filled 2017-08-31: qty 5

## 2017-08-31 MED ORDER — LIDOCAINE HCL 1 % IJ SOLN
INTRAMUSCULAR | Status: AC
  Filled 2017-08-31: qty 20

## 2017-08-31 MED ORDER — LIDOCAINE HCL 1 % IJ SOLN
INTRAMUSCULAR | Status: DC | PRN
  Administered 2017-08-31: 5 mL

## 2017-08-31 MED ORDER — SODIUM CHLORIDE 0.9 % IV SOLN
3.0000 g | Freq: Once | INTRAVENOUS | Status: AC
  Administered 2017-08-31: 3 g via INTRAVENOUS
  Filled 2017-08-31: qty 3

## 2017-08-31 NOTE — Progress Notes (Signed)
Home health RN completed education. Pt discharged home. Condition stable at this time.

## 2017-08-31 NOTE — Progress Notes (Signed)
Pt tolerated Unasyn via PICC well. Waiting for home health nurse to arrive for education prior to discharge home.

## 2017-08-31 NOTE — Procedures (Signed)
Successful placement of single lumen PICC line to right basilic vein. Length 39 cm Tip at lower SVC/RA No complications Ready for use.  Random Dobrowski S Eliani Leclere PA-C

## 2017-09-01 DIAGNOSIS — Z5181 Encounter for therapeutic drug level monitoring: Secondary | ICD-10-CM | POA: Diagnosis not present

## 2017-09-01 DIAGNOSIS — Z452 Encounter for adjustment and management of vascular access device: Secondary | ICD-10-CM | POA: Diagnosis not present

## 2017-09-01 DIAGNOSIS — Z792 Long term (current) use of antibiotics: Secondary | ICD-10-CM | POA: Diagnosis not present

## 2017-09-01 DIAGNOSIS — M272 Inflammatory conditions of jaws: Secondary | ICD-10-CM | POA: Diagnosis not present

## 2017-09-01 DIAGNOSIS — Z8589 Personal history of malignant neoplasm of other organs and systems: Secondary | ICD-10-CM | POA: Diagnosis not present

## 2017-09-01 DIAGNOSIS — I10 Essential (primary) hypertension: Secondary | ICD-10-CM | POA: Diagnosis not present

## 2017-09-01 DIAGNOSIS — Z8673 Personal history of transient ischemic attack (TIA), and cerebral infarction without residual deficits: Secondary | ICD-10-CM | POA: Diagnosis not present

## 2017-09-04 DIAGNOSIS — Z452 Encounter for adjustment and management of vascular access device: Secondary | ICD-10-CM | POA: Diagnosis not present

## 2017-09-04 DIAGNOSIS — I10 Essential (primary) hypertension: Secondary | ICD-10-CM | POA: Diagnosis not present

## 2017-09-04 DIAGNOSIS — M8600 Acute hematogenous osteomyelitis, unspecified site: Secondary | ICD-10-CM | POA: Diagnosis not present

## 2017-09-04 DIAGNOSIS — M272 Inflammatory conditions of jaws: Secondary | ICD-10-CM | POA: Diagnosis not present

## 2017-09-04 DIAGNOSIS — Z8673 Personal history of transient ischemic attack (TIA), and cerebral infarction without residual deficits: Secondary | ICD-10-CM | POA: Diagnosis not present

## 2017-09-04 DIAGNOSIS — Z792 Long term (current) use of antibiotics: Secondary | ICD-10-CM | POA: Diagnosis not present

## 2017-09-04 DIAGNOSIS — Z8589 Personal history of malignant neoplasm of other organs and systems: Secondary | ICD-10-CM | POA: Diagnosis not present

## 2017-09-04 DIAGNOSIS — Z5181 Encounter for therapeutic drug level monitoring: Secondary | ICD-10-CM | POA: Diagnosis not present

## 2017-09-05 ENCOUNTER — Other Ambulatory Visit: Payer: Self-pay | Admitting: Pharmacist

## 2017-09-11 DIAGNOSIS — M272 Inflammatory conditions of jaws: Secondary | ICD-10-CM | POA: Diagnosis not present

## 2017-09-11 DIAGNOSIS — Z452 Encounter for adjustment and management of vascular access device: Secondary | ICD-10-CM | POA: Diagnosis not present

## 2017-09-11 DIAGNOSIS — Z8589 Personal history of malignant neoplasm of other organs and systems: Secondary | ICD-10-CM | POA: Diagnosis not present

## 2017-09-11 DIAGNOSIS — I10 Essential (primary) hypertension: Secondary | ICD-10-CM | POA: Diagnosis not present

## 2017-09-11 DIAGNOSIS — Z5181 Encounter for therapeutic drug level monitoring: Secondary | ICD-10-CM | POA: Diagnosis not present

## 2017-09-11 DIAGNOSIS — Z792 Long term (current) use of antibiotics: Secondary | ICD-10-CM | POA: Diagnosis not present

## 2017-09-11 DIAGNOSIS — Z8673 Personal history of transient ischemic attack (TIA), and cerebral infarction without residual deficits: Secondary | ICD-10-CM | POA: Diagnosis not present

## 2017-09-11 DIAGNOSIS — M8600 Acute hematogenous osteomyelitis, unspecified site: Secondary | ICD-10-CM | POA: Diagnosis not present

## 2017-09-18 DIAGNOSIS — M868X8 Other osteomyelitis, other site: Secondary | ICD-10-CM | POA: Diagnosis not present

## 2017-09-18 DIAGNOSIS — M272 Inflammatory conditions of jaws: Secondary | ICD-10-CM | POA: Diagnosis not present

## 2017-09-18 DIAGNOSIS — Z452 Encounter for adjustment and management of vascular access device: Secondary | ICD-10-CM | POA: Diagnosis not present

## 2017-09-18 DIAGNOSIS — Z792 Long term (current) use of antibiotics: Secondary | ICD-10-CM | POA: Diagnosis not present

## 2017-09-18 DIAGNOSIS — I10 Essential (primary) hypertension: Secondary | ICD-10-CM | POA: Diagnosis not present

## 2017-09-18 DIAGNOSIS — Z8673 Personal history of transient ischemic attack (TIA), and cerebral infarction without residual deficits: Secondary | ICD-10-CM | POA: Diagnosis not present

## 2017-09-18 DIAGNOSIS — Z5181 Encounter for therapeutic drug level monitoring: Secondary | ICD-10-CM | POA: Diagnosis not present

## 2017-09-18 DIAGNOSIS — Z8589 Personal history of malignant neoplasm of other organs and systems: Secondary | ICD-10-CM | POA: Diagnosis not present

## 2017-09-21 ENCOUNTER — Other Ambulatory Visit: Payer: Self-pay | Admitting: Pharmacist

## 2017-09-25 DIAGNOSIS — M8600 Acute hematogenous osteomyelitis, unspecified site: Secondary | ICD-10-CM | POA: Diagnosis not present

## 2017-09-25 DIAGNOSIS — M272 Inflammatory conditions of jaws: Secondary | ICD-10-CM | POA: Diagnosis not present

## 2017-09-25 DIAGNOSIS — Z8589 Personal history of malignant neoplasm of other organs and systems: Secondary | ICD-10-CM | POA: Diagnosis not present

## 2017-09-25 DIAGNOSIS — I10 Essential (primary) hypertension: Secondary | ICD-10-CM | POA: Diagnosis not present

## 2017-09-25 DIAGNOSIS — Z8673 Personal history of transient ischemic attack (TIA), and cerebral infarction without residual deficits: Secondary | ICD-10-CM | POA: Diagnosis not present

## 2017-09-25 DIAGNOSIS — Z5181 Encounter for therapeutic drug level monitoring: Secondary | ICD-10-CM | POA: Diagnosis not present

## 2017-09-25 DIAGNOSIS — Z792 Long term (current) use of antibiotics: Secondary | ICD-10-CM | POA: Diagnosis not present

## 2017-09-25 DIAGNOSIS — Z452 Encounter for adjustment and management of vascular access device: Secondary | ICD-10-CM | POA: Diagnosis not present

## 2017-09-27 DIAGNOSIS — C775 Secondary and unspecified malignant neoplasm of intrapelvic lymph nodes: Secondary | ICD-10-CM | POA: Diagnosis not present

## 2017-09-27 DIAGNOSIS — C61 Malignant neoplasm of prostate: Secondary | ICD-10-CM | POA: Diagnosis not present

## 2017-09-28 DIAGNOSIS — Z79899 Other long term (current) drug therapy: Secondary | ICD-10-CM | POA: Diagnosis not present

## 2017-09-28 DIAGNOSIS — Z515 Encounter for palliative care: Secondary | ICD-10-CM | POA: Diagnosis not present

## 2017-09-28 DIAGNOSIS — C775 Secondary and unspecified malignant neoplasm of intrapelvic lymph nodes: Secondary | ICD-10-CM | POA: Diagnosis not present

## 2017-09-28 DIAGNOSIS — C61 Malignant neoplasm of prostate: Secondary | ICD-10-CM | POA: Diagnosis not present

## 2017-10-03 ENCOUNTER — Ambulatory Visit: Payer: PPO | Admitting: Internal Medicine

## 2017-10-03 DIAGNOSIS — Z8589 Personal history of malignant neoplasm of other organs and systems: Secondary | ICD-10-CM | POA: Diagnosis not present

## 2017-10-03 DIAGNOSIS — Z8673 Personal history of transient ischemic attack (TIA), and cerebral infarction without residual deficits: Secondary | ICD-10-CM | POA: Diagnosis not present

## 2017-10-03 DIAGNOSIS — Z452 Encounter for adjustment and management of vascular access device: Secondary | ICD-10-CM | POA: Diagnosis not present

## 2017-10-03 DIAGNOSIS — I10 Essential (primary) hypertension: Secondary | ICD-10-CM | POA: Diagnosis not present

## 2017-10-03 DIAGNOSIS — M272 Inflammatory conditions of jaws: Secondary | ICD-10-CM | POA: Diagnosis not present

## 2017-10-03 DIAGNOSIS — Z5181 Encounter for therapeutic drug level monitoring: Secondary | ICD-10-CM | POA: Diagnosis not present

## 2017-10-03 DIAGNOSIS — Z792 Long term (current) use of antibiotics: Secondary | ICD-10-CM | POA: Diagnosis not present

## 2017-10-05 ENCOUNTER — Other Ambulatory Visit: Payer: Self-pay | Admitting: Pharmacist

## 2017-10-09 ENCOUNTER — Telehealth: Payer: Self-pay | Admitting: *Deleted

## 2017-10-09 ENCOUNTER — Encounter: Payer: Self-pay | Admitting: Internal Medicine

## 2017-10-09 ENCOUNTER — Ambulatory Visit (INDEPENDENT_AMBULATORY_CARE_PROVIDER_SITE_OTHER): Payer: PPO | Admitting: Internal Medicine

## 2017-10-09 VITALS — BP 184/99 | HR 77 | Temp 97.5°F | Wt 211.0 lb

## 2017-10-09 DIAGNOSIS — Z5181 Encounter for therapeutic drug level monitoring: Secondary | ICD-10-CM | POA: Diagnosis not present

## 2017-10-09 DIAGNOSIS — I10 Essential (primary) hypertension: Secondary | ICD-10-CM

## 2017-10-09 DIAGNOSIS — M272 Inflammatory conditions of jaws: Secondary | ICD-10-CM

## 2017-10-09 DIAGNOSIS — M8600 Acute hematogenous osteomyelitis, unspecified site: Secondary | ICD-10-CM | POA: Diagnosis not present

## 2017-10-09 DIAGNOSIS — Z792 Long term (current) use of antibiotics: Secondary | ICD-10-CM | POA: Diagnosis not present

## 2017-10-09 DIAGNOSIS — Z452 Encounter for adjustment and management of vascular access device: Secondary | ICD-10-CM | POA: Diagnosis not present

## 2017-10-09 DIAGNOSIS — Z8589 Personal history of malignant neoplasm of other organs and systems: Secondary | ICD-10-CM | POA: Diagnosis not present

## 2017-10-09 DIAGNOSIS — Z8673 Personal history of transient ischemic attack (TIA), and cerebral infarction without residual deficits: Secondary | ICD-10-CM | POA: Diagnosis not present

## 2017-10-09 NOTE — Progress Notes (Signed)
Patient ID: Connor [name], male   DOB: Jul 20, 1946, 71 y.o.   MRN: [id number]  HPI Connor [name] is a 71 yo M who we have been treating for jaw osteomyelitis with amp/sub for the past 4 weeks- we last saw him in the beginning of November. He has recently undergone further evaluation by his oral surgeon where there is concern for ongoing pain and swelling and had repeat imaging. Feels like that there may be new areas of involvement.  Since starting iv abtx, he reports he has less jaw swelling but still has numbness to left lower jaw. Limited opening of mouth. ? Nerve injury to left lower lip.drooling occ. And tenesmus  Outpatient Encounter Medications as of 10/09/2017  Medication Sig  . amLODipine-valsartan (EXFORGE) 5-160 MG per tablet Take 1 tablet by mouth every morning.   Marland Kitchen aspirin 81 MG tablet Take 81 mg by mouth daily.  Marland Kitchen lamoTRIgine (LAMICTAL) 100 MG tablet 100mg  in AM and 200mg  in PM  . simvastatin (ZOCOR) 20 MG tablet Take 20 mg by mouth every evening.   No facility-administered encounter medications on file as of 10/09/2017.      Patient Active Problem List   Diagnosis Date Noted  . Malignant neoplasm of prostate ([place]) 06/24/2014  . Hyponatremia 02/26/2014  . Postoperative anemia due to acute blood loss 02/26/2014  . OA (osteoarthritis) of knee 02/24/2014  . Dizziness 12/04/2011  . Hyperlipidemia LDL goal < 100 10/05/2011  . 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 CVA (cerebral infarction) 10/05/2011  . 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 HTN (hypertension) 09/30/2011  . Seizure disorder ([place]) 09/30/2011     Health Maintenance Due  Topic Date Due  . Hepatitis C Screening  September 27, 1946  . TETANUS/TDAP  02/10/1967  . COLONOSCOPY  02/09/1998  . PNA vac Low Risk Adult (1 of 2 - PCV13) 02/09/2013     Review of Systems Per hpi, otherwise he also notices having a headache to the left side of temple when he sleeps on left side of face. He takes ibuprofen almost daily to  help with jaw pain Physical Exam   BP (!) 184/99   Pulse 77   Temp (!) 97.5 F (36.4 C) (Oral)   Wt 211 lb (95.7 kg)   BMI 29.43 kg/m   Physical Exam  Constitutional: He is oriented to person, place, and time. He appears well-developed and well-nourished. No distress.  HENT: poor dentition, som extraction and implants noted. Limited opening of mouth due to pain Mouth/Throat: Oropharynx is clear and moist. No oropharyngeal exudate.  Neurological: He is alert and oriented to person, place, and time. Left lip minor droop Skin: Skin is warm and dry. No rash noted. No erythema.  Psychiatric: He has a normal mood and affect. His behavior is normal.     CBC Lab Results  Component Value Date   WBC 9.7 08/29/2017   RBC 4.49 08/29/2017   HGB 12.8 (L) 08/29/2017   HCT 37.5 (L) 08/29/2017   PLT 379 08/29/2017   MCV 83.5 08/29/2017   MCH 28.5 08/29/2017   MCHC 34.1 08/29/2017   RDW 12.9 08/29/2017   LYMPHSABS 2,318 08/29/2017   MONOABS 1.3 (H) 10/06/2011   EOSABS 175 08/29/2017    BMET Lab Results  Component Value Date   NA 134 (L) 08/29/2017   K 5.5 (H) 08/29/2017   CL 95 (L) 08/29/2017   CO2 31 08/29/2017   GLUCOSE 116 (H) 08/29/2017   BUN 14 08/29/2017   CREATININE  1.04 08/29/2017   CALCIUM 10.0 08/29/2017   GFRNONAA 74 (L) 02/26/2014   GFRAA 85 (L) 02/26/2014    Lab Results  Component Value Date   ESRSEDRATE 67 (H) 08/29/2017   Lab Results  Component Value Date   CRP 9.2 (H) 08/29/2017     Assessment and Plan  Jaw osteomyelitis = will continue with the original plan to treat for 6-8 wk, thus he will need - 2-3 wks from now.  We will have him hold his abtx for 5 days prior to his bone biopsy so that see if can isolate any bacteria on culture.  htn = poorly controlled on this visit. Will ask him to follow up with pcp for possible change in his BP management

## 2017-10-09 NOTE — Telephone Encounter (Signed)
Morey Hummingbird RN with [facility] called to ask when the stop date for the patient IV antibiotics is she advised he has 3 weeks on hand but her orders stop today and she needs an order to extend. Advised the patient has an appointment today and will make sure someone calls her once it is done to let her know.   Morey Hummingbird [phone]

## 2017-10-09 NOTE — Telephone Encounter (Signed)
[name] to update her that the pt's IV antibiotics will be extended by two weeks per Dr. Baxter Flattery. The home health nurse understood this order and confirmed before ending the call.  Connor [name]

## 2017-10-09 NOTE — Patient Instructions (Signed)
Will continue on current antibiotics  Plan to hold your antibiotics 5 days before biopsy and restart once the biopsy is done  Please ask your oral surgeon to do aerobic, anaerobic and fungal culture

## 2017-10-11 ENCOUNTER — Other Ambulatory Visit: Payer: Self-pay | Admitting: Pharmacist

## 2017-10-16 DIAGNOSIS — M272 Inflammatory conditions of jaws: Secondary | ICD-10-CM | POA: Diagnosis not present

## 2017-10-16 DIAGNOSIS — I10 Essential (primary) hypertension: Secondary | ICD-10-CM | POA: Diagnosis not present

## 2017-10-16 DIAGNOSIS — Z792 Long term (current) use of antibiotics: Secondary | ICD-10-CM | POA: Diagnosis not present

## 2017-10-16 DIAGNOSIS — Z5181 Encounter for therapeutic drug level monitoring: Secondary | ICD-10-CM | POA: Diagnosis not present

## 2017-10-16 DIAGNOSIS — Z8589 Personal history of malignant neoplasm of other organs and systems: Secondary | ICD-10-CM | POA: Diagnosis not present

## 2017-10-16 DIAGNOSIS — Z8673 Personal history of transient ischemic attack (TIA), and cerebral infarction without residual deficits: Secondary | ICD-10-CM | POA: Diagnosis not present

## 2017-10-16 DIAGNOSIS — Z452 Encounter for adjustment and management of vascular access device: Secondary | ICD-10-CM | POA: Diagnosis not present

## 2017-10-16 DIAGNOSIS — M8600 Acute hematogenous osteomyelitis, unspecified site: Secondary | ICD-10-CM | POA: Diagnosis not present

## 2017-10-23 DIAGNOSIS — Z8673 Personal history of transient ischemic attack (TIA), and cerebral infarction without residual deficits: Secondary | ICD-10-CM | POA: Diagnosis not present

## 2017-10-23 DIAGNOSIS — M8600 Acute hematogenous osteomyelitis, unspecified site: Secondary | ICD-10-CM | POA: Diagnosis not present

## 2017-10-23 DIAGNOSIS — Z5181 Encounter for therapeutic drug level monitoring: Secondary | ICD-10-CM | POA: Diagnosis not present

## 2017-10-23 DIAGNOSIS — I10 Essential (primary) hypertension: Secondary | ICD-10-CM | POA: Diagnosis not present

## 2017-10-23 DIAGNOSIS — M272 Inflammatory conditions of jaws: Secondary | ICD-10-CM | POA: Diagnosis not present

## 2017-10-23 DIAGNOSIS — Z452 Encounter for adjustment and management of vascular access device: Secondary | ICD-10-CM | POA: Diagnosis not present

## 2017-10-23 DIAGNOSIS — Z8589 Personal history of malignant neoplasm of other organs and systems: Secondary | ICD-10-CM | POA: Diagnosis not present

## 2017-10-23 DIAGNOSIS — Z792 Long term (current) use of antibiotics: Secondary | ICD-10-CM | POA: Diagnosis not present

## 2017-10-24 HISTORY — PX: MOUTH SURGERY: SHX715

## 2017-10-27 ENCOUNTER — Other Ambulatory Visit: Payer: Self-pay | Admitting: Pharmacist

## 2017-10-30 DIAGNOSIS — Z792 Long term (current) use of antibiotics: Secondary | ICD-10-CM | POA: Diagnosis not present

## 2017-10-30 DIAGNOSIS — M272 Inflammatory conditions of jaws: Secondary | ICD-10-CM | POA: Diagnosis not present

## 2017-10-30 DIAGNOSIS — Z8673 Personal history of transient ischemic attack (TIA), and cerebral infarction without residual deficits: Secondary | ICD-10-CM | POA: Diagnosis not present

## 2017-10-30 DIAGNOSIS — Z452 Encounter for adjustment and management of vascular access device: Secondary | ICD-10-CM | POA: Diagnosis not present

## 2017-10-30 DIAGNOSIS — Z5181 Encounter for therapeutic drug level monitoring: Secondary | ICD-10-CM | POA: Diagnosis not present

## 2017-10-30 DIAGNOSIS — I10 Essential (primary) hypertension: Secondary | ICD-10-CM | POA: Diagnosis not present

## 2017-10-30 DIAGNOSIS — Z8589 Personal history of malignant neoplasm of other organs and systems: Secondary | ICD-10-CM | POA: Diagnosis not present

## 2017-10-31 ENCOUNTER — Other Ambulatory Visit: Payer: Self-pay | Admitting: Pharmacist

## 2017-10-31 ENCOUNTER — Telehealth: Payer: Self-pay

## 2017-10-31 NOTE — Telephone Encounter (Signed)
Pt called front desk today concerning his antibiotics states that he is currently out of antibiotics, and , is scheduled to meet Dr. Janee Morn a Oral Surgeon. He is not sure if he is supposed to go back on antibiotics after meeting the oral surgeon. If so he is not sure how he will be doing this since he does not have any on him. Is wondering if we will be prescribing more antibiotics for him if he needs them depending on his lab results.   [place]

## 2017-11-01 NOTE — Telephone Encounter (Addendum)
RN left the patient a message to clarify further IV antibiotic therapy.  Waiting on upcoming culture results for Dr. Baxter Flattery to order additional antibiotic therapy.  Patient to have a procedure at Dr. Kerry Kass office to obtain culture specimens this week.

## 2017-11-03 DIAGNOSIS — M279 Disease of jaws, unspecified: Secondary | ICD-10-CM | POA: Diagnosis not present

## 2017-11-03 DIAGNOSIS — K045 Chronic apical periodontitis: Secondary | ICD-10-CM | POA: Diagnosis not present

## 2017-11-03 DIAGNOSIS — M272 Inflammatory conditions of jaws: Secondary | ICD-10-CM | POA: Diagnosis not present

## 2017-11-03 DIAGNOSIS — M869 Osteomyelitis, unspecified: Secondary | ICD-10-CM | POA: Diagnosis not present

## 2017-11-03 NOTE — Telephone Encounter (Signed)
Anderson Malta from Dr Janeann Forehand office called to advise that the patient is due to be seen there today and have the biopsies Dr Baxter Flattery requested. She advised patient states that since stopping his antibiotics he has pain and facial swelling. Their question is what should he do once he has the biopsy done. And if he should go back on antibiotics until results are back from lab. Advised her not sure as we requested the biopsy to see what we are treating but will send Baxter Flattery a message and let her know what is going on and to keep an eye out for the results.

## 2017-11-06 ENCOUNTER — Telehealth: Payer: Self-pay | Admitting: *Deleted

## 2017-11-06 DIAGNOSIS — Z452 Encounter for adjustment and management of vascular access device: Secondary | ICD-10-CM | POA: Diagnosis not present

## 2017-11-06 DIAGNOSIS — Z8673 Personal history of transient ischemic attack (TIA), and cerebral infarction without residual deficits: Secondary | ICD-10-CM | POA: Diagnosis not present

## 2017-11-06 DIAGNOSIS — Z8589 Personal history of malignant neoplasm of other organs and systems: Secondary | ICD-10-CM | POA: Diagnosis not present

## 2017-11-06 DIAGNOSIS — Z5181 Encounter for therapeutic drug level monitoring: Secondary | ICD-10-CM | POA: Diagnosis not present

## 2017-11-06 DIAGNOSIS — Z792 Long term (current) use of antibiotics: Secondary | ICD-10-CM | POA: Diagnosis not present

## 2017-11-06 DIAGNOSIS — M272 Inflammatory conditions of jaws: Secondary | ICD-10-CM | POA: Diagnosis not present

## 2017-11-06 DIAGNOSIS — I10 Essential (primary) hypertension: Secondary | ICD-10-CM | POA: Diagnosis not present

## 2017-11-06 NOTE — Telephone Encounter (Signed)
Recent culture samples to be sent to [facility] for processing.  Results will be sent to Dr. Baxter Flattery.

## 2017-11-09 NOTE — Telephone Encounter (Signed)
lab results "should" be ready 11/10/17 per Dr. Dorian Heckle office

## 2017-11-13 DIAGNOSIS — I10 Essential (primary) hypertension: Secondary | ICD-10-CM | POA: Diagnosis not present

## 2017-11-13 DIAGNOSIS — Z8673 Personal history of transient ischemic attack (TIA), and cerebral infarction without residual deficits: Secondary | ICD-10-CM | POA: Diagnosis not present

## 2017-11-13 DIAGNOSIS — Z5181 Encounter for therapeutic drug level monitoring: Secondary | ICD-10-CM | POA: Diagnosis not present

## 2017-11-13 DIAGNOSIS — Z452 Encounter for adjustment and management of vascular access device: Secondary | ICD-10-CM | POA: Diagnosis not present

## 2017-11-13 DIAGNOSIS — Z8589 Personal history of malignant neoplasm of other organs and systems: Secondary | ICD-10-CM | POA: Diagnosis not present

## 2017-11-13 DIAGNOSIS — M272 Inflammatory conditions of jaws: Secondary | ICD-10-CM | POA: Diagnosis not present

## 2017-11-13 DIAGNOSIS — Z792 Long term (current) use of antibiotics: Secondary | ICD-10-CM | POA: Diagnosis not present

## 2017-11-15 DIAGNOSIS — M8668 Other chronic osteomyelitis, other site: Secondary | ICD-10-CM | POA: Diagnosis not present

## 2017-11-15 DIAGNOSIS — M8618 Other acute osteomyelitis, other site: Secondary | ICD-10-CM | POA: Diagnosis not present

## 2017-11-15 DIAGNOSIS — K045 Chronic apical periodontitis: Secondary | ICD-10-CM | POA: Diagnosis not present

## 2017-11-17 DIAGNOSIS — M8668 Other chronic osteomyelitis, other site: Secondary | ICD-10-CM | POA: Diagnosis not present

## 2017-11-17 DIAGNOSIS — M8618 Other acute osteomyelitis, other site: Secondary | ICD-10-CM | POA: Diagnosis not present

## 2017-11-17 DIAGNOSIS — K045 Chronic apical periodontitis: Secondary | ICD-10-CM | POA: Diagnosis not present

## 2017-11-21 ENCOUNTER — Telehealth: Payer: Self-pay | Admitting: *Deleted

## 2017-11-21 DIAGNOSIS — Z452 Encounter for adjustment and management of vascular access device: Secondary | ICD-10-CM | POA: Diagnosis not present

## 2017-11-21 DIAGNOSIS — Z8589 Personal history of malignant neoplasm of other organs and systems: Secondary | ICD-10-CM | POA: Diagnosis not present

## 2017-11-21 DIAGNOSIS — I10 Essential (primary) hypertension: Secondary | ICD-10-CM | POA: Diagnosis not present

## 2017-11-21 DIAGNOSIS — Z8673 Personal history of transient ischemic attack (TIA), and cerebral infarction without residual deficits: Secondary | ICD-10-CM | POA: Diagnosis not present

## 2017-11-21 DIAGNOSIS — Z5181 Encounter for therapeutic drug level monitoring: Secondary | ICD-10-CM | POA: Diagnosis not present

## 2017-11-21 DIAGNOSIS — Z792 Long term (current) use of antibiotics: Secondary | ICD-10-CM | POA: Diagnosis not present

## 2017-11-21 DIAGNOSIS — M272 Inflammatory conditions of jaws: Secondary | ICD-10-CM | POA: Diagnosis not present

## 2017-11-21 NOTE — Telephone Encounter (Signed)
Anderson Malta from Dr Buelah Manis at the [facility] calling. She will resend cultures today to make sure Dr Baxter Flattery received them. Dr Buelah Manis would like Dr Storm Frisk opinion of hyperbaric treatment. Landis Gandy, RN

## 2017-11-23 ENCOUNTER — Encounter: Payer: Self-pay | Admitting: Internal Medicine

## 2017-11-23 ENCOUNTER — Ambulatory Visit (INDEPENDENT_AMBULATORY_CARE_PROVIDER_SITE_OTHER): Payer: PPO | Admitting: Internal Medicine

## 2017-11-23 VITALS — BP 169/95 | HR 76 | Temp 97.7°F | Ht 71.0 in | Wt 211.0 lb

## 2017-11-23 DIAGNOSIS — M272 Inflammatory conditions of jaws: Secondary | ICD-10-CM

## 2017-11-23 MED ORDER — LEVOFLOXACIN 500 MG PO TABS: 500.0000 mg | ORAL_TABLET | Freq: Every day | ORAL | 2 refills | Status: DC

## 2017-11-23 MED ORDER — FLUCONAZOLE 200 MG PO TABS: 400.0000 mg | ORAL_TABLET | Freq: Every day | ORAL | 2 refills | Status: DC

## 2017-11-23 NOTE — Progress Notes (Signed)
Patient ID: Connor [name], male   DOB: 22-Oct-1946, 72 y.o.   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 He states he noticed that hte swelling decreased but still numb to the left lower jaw region.  Amox/clav R 32 Cefazolin R ceftiraxone 1 S cipro S Ertapenem S Gent S imi S piptazo S Bactrim S  Outpatient Encounter Medications as of 11/23/2017  Medication Sig  . amLODipine-valsartan (EXFORGE) 5-160 MG per tablet Take 1 tablet by mouth every morning.   Marland Kitchen aspirin 81 MG tablet Take 81 mg by mouth daily.  Marland Kitchen lamoTRIgine (LAMICTAL) 100 MG tablet 100mg  in AM and 200mg  in PM  . simvastatin (ZOCOR) 20 MG tablet Take 20 mg by mouth every evening.   No facility-administered encounter medications on file as of 11/23/2017.      Patient Active Problem List   Diagnosis Date Noted  . Malignant neoplasm of prostate ([place]) 06/24/2014  . Hyponatremia 02/26/2014  . Postoperative anemia due to acute blood loss 02/26/2014  . OA (osteoarthritis) of knee 02/24/2014  . Dizziness 12/04/2011  . Hyperlipidemia LDL goal < 100 10/05/2011  . 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 CVA (cerebral infarction) 10/05/2011  . 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 HTN (hypertension) 09/30/2011  . Seizure disorder ([place]) 09/30/2011     Health Maintenance Due  Topic Date Due  . Hepatitis C Screening  31-Jul-1946  . TETANUS/TDAP  02/10/1967  . COLONOSCOPY  02/09/1998  . PNA vac Low Risk Adult (1 of 2 - PCV13) 02/09/2013     Review of Systems Review of Systems  Constitutional: Negative for fever, chills, diaphoresis, activity change, appetite change, fatigue and unexpected weight change.  HENT: Negative for congestion, sore  throat, rhinorrhea, sneezing, trouble swallowing and sinus pressure.  Eyes: Negative for photophobia and visual disturbance.  Respiratory: Negative for cough, chest tightness, shortness of breath, wheezing and stridor.  Cardiovascular: Negative for chest pain, palpitations and leg swelling.  Gastrointestinal: Negative for nausea, vomiting, abdominal pain, diarrhea, constipation, blood in stool, abdominal distention and anal bleeding.  Genitourinary: Negative for dysuria, hematuria, flank pain and difficulty urinating.  Musculoskeletal: Negative for myalgias, back pain, joint swelling, arthralgias and gait problem.  Skin: Negative for color change, pallor, rash and wound.  Neurological: Negative for dizziness, tremors, weakness and light-headedness.  Hematological: Negative for adenopathy. Does not bruise/bleed easily.  Psychiatric/Behavioral: Negative for behavioral problems, confusion, sleep disturbance, dysphoric mood, decreased concentration and agitation.    Physical Exam   BP (!) 169/95 Comment: checked twice  Pulse 76   Temp 97.7 F (36.5 C) (Oral)   Ht 5\' 11"  (1.803 m)   Wt 211 lb (95.7 kg)   BMI 29.43 kg/m    Physical Exam  Constitutional: He is oriented to person, place, and time. He appears well-developed and well-nourished. No distress.  HENT:  Mouth/Throat: Oropharynx is clear and moist. No oropharyngeal exudate. Poor dentition, difficult to see if visible jaw bone  Cardiovascular: Normal rate, regular rhythm and normal heart sounds. Exam reveals no gallop and no friction rub.  No murmur heard.  Pulmonary/Chest: Effort normal and breath sounds normal. No respiratory distress. He has no wheezes.  Abdominal: Soft. Bowel sounds are normal. He exhibits no distension.  There is no tenderness.  Lymphadenopathy:  He has no cervical adenopathy.  Neurological: He is alert and oriented to person, place, and time.  Skin: Skin is warm and dry. No rash noted. No erythema.    Psychiatric: He has a normal mood and affect. His behavior is normal.    CBC Lab Results  Component Value Date   WBC 9.7 08/29/2017   RBC 4.49 08/29/2017   HGB 12.8 (L) 08/29/2017   HCT 37.5 (L) 08/29/2017   PLT 379 08/29/2017   MCV 83.5 08/29/2017   MCH 28.5 08/29/2017   MCHC 34.1 08/29/2017   RDW 12.9 08/29/2017   LYMPHSABS 2,318 08/29/2017   MONOABS 1.3 (H) 10/06/2011   EOSABS 175 08/29/2017    BMET Lab Results  Component Value Date   NA 134 (L) 08/29/2017   K 5.5 (H) 08/29/2017   CL 95 (L) 08/29/2017   CO2 31 08/29/2017   GLUCOSE 116 (H) 08/29/2017   BUN 14 08/29/2017   CREATININE 1.04 08/29/2017   CALCIUM 10.0 08/29/2017   GFRNONAA 74 (L) 02/26/2014   GFRAA 85 (L) 02/26/2014     Assessment and Plan  Mandibular osteo= - will have him start cipro and fluconazole to see if he notices any improvement. No longer needs picc line, we will d/c picc line. - question posed to [place] is if HBO could be helpful - it maybe worth trying 2 weeks of process to see if any improvement in the process. Some patients do respond to HBO but the literature is not overwhelming showing improvement with this intervention. I would be interested to see if we could get some improvement in his healing process, but unsure what the out of pocket costs would be for the patient

## 2017-11-27 ENCOUNTER — Telehealth: Payer: Self-pay | Admitting: *Deleted

## 2017-11-27 NOTE — Telephone Encounter (Signed)
Per verbal order from Dr Baxter Flattery, 41 cm Single Lumen Peripherally Inserted Central Catheter removed from right basilic, tip intact. No sutures present. Per chart review, PICC was documented at 39 cm. Dr Baxter Flattery notified. Dressing was clean and dry. Petroleum dressing applied. Pt advised no heavy lifting with this arm, leave dressing for 24 hours and call the office or seek emergent care if dressing becomes soaked with blood or sharp pain presents. Patient verbalized understanding and agreement.  Patient's questions answered to their satisfaction. Patient tolerated procedure well, RN walked patient to check out. Landis Gandy, RN

## 2017-12-25 ENCOUNTER — Encounter: Payer: Self-pay | Admitting: Internal Medicine

## 2017-12-25 ENCOUNTER — Ambulatory Visit (INDEPENDENT_AMBULATORY_CARE_PROVIDER_SITE_OTHER): Payer: PPO | Admitting: Internal Medicine

## 2017-12-25 VITALS — BP 185/94 | HR 79 | Temp 98.2°F | Ht 71.0 in | Wt 209.0 lb

## 2017-12-25 DIAGNOSIS — C61 Malignant neoplasm of prostate: Secondary | ICD-10-CM | POA: Diagnosis not present

## 2017-12-25 DIAGNOSIS — I10 Essential (primary) hypertension: Secondary | ICD-10-CM

## 2017-12-25 DIAGNOSIS — M272 Inflammatory conditions of jaws: Secondary | ICD-10-CM

## 2017-12-25 MED ORDER — VALSARTAN 160 MG PO TABS: 160.0000 mg | ORAL_TABLET | Freq: Every day | ORAL | 11 refills | Status: DC

## 2017-12-25 NOTE — Progress Notes (Signed)
RFV: follow for osteomyelitis of jaw  Patient ID: Connor [name], male   DOB: September 09, 1946, 72 y.o.   MRN: [id number]  HPI Connor [name] is a 72yo M with hx of head/neck cancer with neck radiation and most recently development of jaw osteomyelitis. He was initially on 4 wk of amp/sub then converted to chronic suppression with ciprofloxacin and fluconazole which has helped. feels better with less swelling to left jaw but numbness unchanged. Saw his oncologist for management of prostate ca. He has not started any HOB treatment.  No diarrhea  Has had hypotension at night so he has stopped taking his BP meds  Outpatient Encounter Medications as of 12/25/2017  Medication Sig  . aspirin 81 MG tablet Take 81 mg by mouth daily.  . fluconazole (DIFLUCAN) 200 MG tablet Take 2 tablets (400 mg total) by mouth daily.  Marland Kitchen lamoTRIgine (LAMICTAL) 100 MG tablet 100mg  in AM and 200mg  in PM  . levofloxacin (LEVAQUIN) 500 MG tablet Take 1 tablet (500 mg total) by mouth daily.  . simvastatin (ZOCOR) 20 MG tablet Take 20 mg by mouth every evening.  . valsartan (DIOVAN) 160 MG tablet Take 1 tablet (160 mg total) by mouth daily.  . [DISCONTINUED] amLODipine-valsartan (EXFORGE) 5-160 MG per tablet Take 1 tablet by mouth every morning.    No facility-administered encounter medications on file as of 12/25/2017.      Patient Active Problem List   Diagnosis Date Noted  . Malignant neoplasm of prostate ([place]) 06/24/2014  . Hyponatremia 02/26/2014  . Postoperative anemia due to acute blood loss 02/26/2014  . OA (osteoarthritis) of knee 02/24/2014  . Dizziness 12/04/2011  . Hyperlipidemia LDL goal < 100 10/05/2011  . 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 CVA (cerebral infarction) 10/05/2011  . 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 HTN (hypertension) 09/30/2011  . Seizure disorder ([place]) 09/30/2011     Health Maintenance Due  Topic Date Due  . Hepatitis C Screening  15-Apr-1946  . TETANUS/TDAP  02/10/1967  .  COLONOSCOPY  02/09/1998  . PNA vac Low Risk Adult (1 of 2 - PCV13) 02/09/2013     Review of Systems +dizziness in the evening after taking bp meds. Otherwise 12 point ros is negative Physical Exam   BP (!) 185/94   Pulse 79   Temp 98.2 F (36.8 C) (Oral)   Ht 5\' 11"  (1.803 m)   Wt 209 lb (94.8 kg)   BMI 29.15 kg/m    Physical Exam  Constitutional: He is oriented to person, place, and time. He appears well-developed and well-nourished. No distress.  HENT: less swelling to left side of face, poor dentition Mouth/Throat: Oropharynx is clear and moist. No oropharyngeal exudate.  Cardiovascular: Normal rate, regular rhythm and normal heart sounds. Exam reveals no gallop and no friction rub.  No murmur heard.  Pulmonary/Chest: Effort normal and breath sounds normal. No respiratory distress. He has no wheezes.  Abdominal: Soft. Bowel sounds are normal. He exhibits no distension. There is no tenderness.  Lymphadenopathy:  He has no cervical adenopathy.  Neurological: He is alert and oriented to person, place, and time.  Skin: Skin is warm and dry. No rash noted. No erythema.  Psychiatric: He has a normal mood and affect. His behavior is normal.    CBC Lab Results  Component Value Date   WBC 9.7 08/29/2017   RBC 4.49 08/29/2017   HGB 12.8 (L) 08/29/2017   HCT 37.5 (L) 08/29/2017   PLT 379 08/29/2017   MCV 83.5  08/29/2017   MCH 28.5 08/29/2017   MCHC 34.1 08/29/2017   RDW 12.9 08/29/2017   LYMPHSABS 2,318 08/29/2017   MONOABS 1.3 (H) 10/06/2011   EOSABS 175 08/29/2017    BMET Lab Results  Component Value Date   NA 134 (L) 08/29/2017   K 5.5 (H) 08/29/2017   CL 95 (L) 08/29/2017   CO2 31 08/29/2017   GLUCOSE 116 (H) 08/29/2017   BUN 14 08/29/2017   CREATININE 1.04 08/29/2017   CALCIUM 10.0 08/29/2017   GFRNONAA 74 (L) 02/26/2014   GFRAA 85 (L) 02/26/2014    Lab Results  Component Value Date   ESRSEDRATE 67 (H) 08/29/2017   Lab Results  Component Value Date    CRP 9.2 (H) 08/29/2017   Lab Results  Component Value Date   ESRSEDRATE 22 (H) 12/25/2017   Lab Results  Component Value Date   CRP 6.2 12/25/2017     Assessment and Plan  htn = poorly controlled but it sounds labile. He has hx of stroke. Has stopped taking his combination med due to hypotension. We will just give him diovan 160mg  (without the amlodipine) to see if he is still having dizziness, may need to decrease diovan further and have him follow up with his pcp.  Osteo of jaw = will continue on cipro and fluc. And check sed rate and crp.   Addendum = improvement in inflammatory markers

## 2017-12-26 LAB — COMPLETE METABOLIC PANEL WITH GFR
AG RATIO: 1.4 (calc) (ref 1.0–2.5)
ALKALINE PHOSPHATASE (APISO): 89 U/L (ref 40–115)
ALT: 10 U/L (ref 9–46)
AST: 18 U/L (ref 10–35)
Albumin: 4.2 g/dL (ref 3.6–5.1)
BUN: 19 mg/dL (ref 7–25)
CO2: 29 mmol/L (ref 20–32)
Calcium: 10.1 mg/dL (ref 8.6–10.3)
Chloride: 94 mmol/L — ABNORMAL LOW (ref 98–110)
Creat: 1.14 mg/dL (ref 0.70–1.25)
GFR, Est African American: 76 mL/min/{1.73_m2} (ref >=60)
GFR, Est Non African American: 65 mL/min/{1.73_m2} (ref >=60)
Globulin: 3.1 g/dL (calc) (ref 1.9–3.7)
Glucose, Bld: 123 mg/dL — ABNORMAL HIGH (ref 65–99)
POTASSIUM: 4.9 mmol/L (ref 3.5–5.3)
Sodium: 133 mmol/L — ABNORMAL LOW (ref 135–146)
Total Bilirubin: 0.4 mg/dL (ref 0.2–1.2)
Total Protein: 7.3 g/dL (ref 6.1–8.1)

## 2017-12-26 LAB — SEDIMENTATION RATE: Sed Rate: 22 mm/h — ABNORMAL HIGH (ref 0–20)

## 2017-12-26 LAB — C-REACTIVE PROTEIN: CRP: 6.2 mg/L (ref <8.0)

## 2017-12-27 DIAGNOSIS — C775 Secondary and unspecified malignant neoplasm of intrapelvic lymph nodes: Secondary | ICD-10-CM | POA: Diagnosis not present

## 2017-12-27 DIAGNOSIS — Z79899 Other long term (current) drug therapy: Secondary | ICD-10-CM | POA: Diagnosis not present

## 2017-12-27 DIAGNOSIS — R5383 Other fatigue: Secondary | ICD-10-CM | POA: Diagnosis not present

## 2017-12-27 DIAGNOSIS — C61 Malignant neoplasm of prostate: Secondary | ICD-10-CM | POA: Diagnosis not present

## 2018-01-09 ENCOUNTER — Ambulatory Visit (INDEPENDENT_AMBULATORY_CARE_PROVIDER_SITE_OTHER): Payer: PPO | Admitting: Internal Medicine

## 2018-01-09 ENCOUNTER — Encounter: Payer: Self-pay | Admitting: Internal Medicine

## 2018-01-09 VITALS — BP 160/92 | HR 93 | Temp 98.7°F | Wt 211.0 lb

## 2018-01-09 DIAGNOSIS — J988 Other specified respiratory disorders: Secondary | ICD-10-CM | POA: Diagnosis not present

## 2018-01-09 DIAGNOSIS — B89 Unspecified parasitic disease: Secondary | ICD-10-CM

## 2018-01-09 DIAGNOSIS — M272 Inflammatory conditions of jaws: Secondary | ICD-10-CM

## 2018-01-09 NOTE — Progress Notes (Signed)
RFV" cough up a worm Patient ID: Connor [name], male   DOB: 11-06-1945, 72 y.o.   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 He has 2 homes on his property but the one he resides in presently does not have filtration system as his other home- serviced by well water. No recent coughing or cold or fevers or chills.   He brings with him dessicated sample on tissue.  Outpatient Encounter Medications as of 01/09/2018  Medication Sig  . aspirin 81 MG tablet Take 81 mg by mouth daily.  . fluconazole (DIFLUCAN) 200 MG tablet Take 2 tablets (400 mg total) by mouth daily.  Marland Kitchen lamoTRIgine (LAMICTAL) 100 MG tablet 100mg  in AM and 200mg  in PM  . levofloxacin (LEVAQUIN) 500 MG tablet Take 1 tablet (500 mg total) by mouth daily.  . simvastatin (ZOCOR) 20 MG tablet Take 20 mg by mouth every evening.  . valsartan (DIOVAN) 160 MG tablet Take 1 tablet (160 mg total) by mouth daily.   No facility-administered encounter medications on file as of 01/09/2018.      Patient Active Problem List   Diagnosis Date Noted  . Malignant neoplasm of prostate ([place]) 06/24/2014  . Hyponatremia 02/26/2014  . Postoperative anemia due to acute blood loss 02/26/2014  . OA (osteoarthritis) of knee 02/24/2014  . Dizziness 12/04/2011  . Hyperlipidemia LDL goal < 100 10/05/2011  . 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 CVA (cerebral infarction) 10/05/2011  . 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 HTN (hypertension) 09/30/2011  . Seizure disorder ([place]) 09/30/2011     Health Maintenance Due  Topic Date Due  . Hepatitis C Screening  1946-08-24  . TETANUS/TDAP  02/10/1967  . COLONOSCOPY  02/09/1998  . PNA vac Low Risk Adult (1 of 2 - PCV13) 02/09/2013     Review of Systems 12 point ros is negative other than left jaw numbness, chronic Physical Exam   BP (!) 160/92    Pulse 93   Temp 98.7 F (37.1 C) (Oral)   Wt 211 lb (95.7 kg)   BMI 29.43 kg/m   Physical Exam  Constitutional: He is oriented to person, place, and time. He appears well-developed and well-nourished. No distress.  HENT:  Mouth/Throat: Oropharynx is clear and moist. No oropharyngeal exudate. Poor dentition Cardiovascular: Normal rate, regular rhythm and normal heart sounds. Exam reveals no gallop and no friction rub.  No murmur heard.  Pulmonary/Chest: Effort normal and breath sounds normal. No respiratory distress. He has no wheezes.  Abdominal: Soft. Bowel sounds are normal. He exhibits no distension. There is no tenderness.  Lymphadenopathy:  He has no cervical adenopathy.  Neurological: He is alert and oriented to person, place, and time.  Skin: Skin is warm and dry. No rash noted. No erythema.  Psychiatric: He has a normal mood and affect. His behavior is normal.    CBC Lab Results  Component Value Date   WBC 9.7 08/29/2017   RBC 4.49 08/29/2017   HGB 12.8 (L) 08/29/2017   HCT 37.5 (L) 08/29/2017   PLT 379 08/29/2017   MCV 83.5 08/29/2017   MCH 28.5 08/29/2017   MCHC 34.1 08/29/2017   RDW 12.9 08/29/2017   LYMPHSABS 2,318 08/29/2017   MONOABS 1.3 (H) 10/06/2011   EOSABS 175 08/29/2017    BMET Lab Results  Component Value  Date   NA 133 (L) 12/25/2017   K 4.9 12/25/2017   CL 94 (L) 12/25/2017   CO2 29 12/25/2017   GLUCOSE 123 (H) 12/25/2017   BUN 19 12/25/2017   CREATININE 1.14 12/25/2017   CALCIUM 10.1 12/25/2017   GFRNONAA 65 12/25/2017   GFRAA 76 12/25/2017      Assessment and Plan  Possible parasitic exposure = unclear what he has coughed up. If he notices again, I asked him to keep in saline water and bring specimen. His cbc only show 2% eos on previous differentials. Will check strongyloides ab. Possibly he had drank/inhaled water that was contaminated? Continue to monitor for symptoms. At this time, I don't think this is related to his jaw  osteo  Jaw osteo = continue on chronic abtx

## 2018-01-17 LAB — CBC WITH DIFFERENTIAL/PLATELET
Basophils Absolute: 50 cells/uL (ref 0–200)
Basophils Relative: 0.5 %
EOS ABS: 220 {cells}/uL (ref 15–500)
Eosinophils Relative: 2.2 %
HEMATOCRIT: 35.7 % — AB (ref 38.5–50.0)
HEMOGLOBIN: 12.5 g/dL — AB (ref 13.2–17.1)
LYMPHS ABS: 1730 {cells}/uL (ref 850–3900)
MCH: 27.9 pg (ref 27.0–33.0)
MCHC: 35 g/dL (ref 32.0–36.0)
MCV: 79.7 fL — AB (ref 80.0–100.0)
MPV: 9.7 fL (ref 7.5–12.5)
Monocytes Relative: 13.1 %
NEUTROS ABS: 6690 {cells}/uL (ref 1500–7800)
NEUTROS PCT: 66.9 %
Platelets: 301 10*3/uL (ref 140–400)
RBC: 4.48 10*6/uL (ref 4.20–5.80)
RDW: 12.3 % (ref 11.0–15.0)
Total Lymphocyte: 17.3 %
WBC mixed population: 1310 cells/uL — ABNORMAL HIGH (ref 200–950)
WBC: 10 10*3/uL (ref 3.8–10.8)

## 2018-01-17 LAB — STRONGYLOIDES ANTIBODY: STRONGYLOIDES IGG ANTIBODY, ELISA: NEGATIVE

## 2018-01-23 ENCOUNTER — Encounter: Payer: Self-pay | Admitting: Internal Medicine

## 2018-01-23 ENCOUNTER — Ambulatory Visit (INDEPENDENT_AMBULATORY_CARE_PROVIDER_SITE_OTHER): Payer: PPO | Admitting: Internal Medicine

## 2018-01-23 VITALS — BP 187/103 | HR 73 | Temp 97.6°F | Ht 71.0 in | Wt 210.0 lb

## 2018-01-23 DIAGNOSIS — I10 Essential (primary) hypertension: Secondary | ICD-10-CM | POA: Diagnosis not present

## 2018-01-23 DIAGNOSIS — M272 Inflammatory conditions of jaws: Secondary | ICD-10-CM | POA: Diagnosis not present

## 2018-01-23 MED ORDER — HYDROCHLOROTHIAZIDE 12.5 MG PO TABS: 12.5000 mg | ORAL_TABLET | Freq: Every day | ORAL | 11 refills | Status: DC

## 2018-01-23 MED ORDER — FLUCONAZOLE 200 MG PO TABS: 400.0000 mg | ORAL_TABLET | Freq: Every day | ORAL | 2 refills | Status: DC

## 2018-01-23 MED ORDER — LEVOFLOXACIN 500 MG PO TABS: 500.0000 mg | ORAL_TABLET | Freq: Every day | ORAL | 2 refills | Status: DC

## 2018-01-23 MED ORDER — AMLODIPINE BESYLATE 10 MG PO TABS: 10.0000 mg | ORAL_TABLET | Freq: Every day | ORAL | 11 refills | Status: DC

## 2018-01-23 NOTE — Patient Instructions (Signed)
We have started you on a new Blood Pressure medication, called hydrochlorothiazide.   Continue to check your blood pressure daily at home.  If you have BP reading of 100/70 or less, please call office   Please have your kidney function and BP checked at your primary care office in 1-2 wks

## 2018-01-23 NOTE — Progress Notes (Signed)
RFV: follow up for jaw osteo  Patient ID: Connor [name], male   DOB: June 20, 1946, 72 y.o.   MRN: [id number]  HPI Connor [name] is a 72yo M with history of head neck cancer who has been on chronic abtx for jaw osteo, currently on fluconazole plus levofloxcain which he is currently on. He was last seen last week due to coughing foreign material that it appeared to be worm-like. Work up did not show any eosinophilia nor any postive ab for strongyloides. He has not had further episodes. He has been drinking bottled water/filtered water.  Hx of labile blood pressure. We stopped amlodipine since he had episodes of low bp in the evenings Outpatient Encounter Medications as of 01/23/2018  Medication Sig  . aspirin 81 MG tablet Take 81 mg by mouth daily.  . fluconazole (DIFLUCAN) 200 MG tablet Take 2 tablets (400 mg total) by mouth daily.  Marland Kitchen lamoTRIgine (LAMICTAL) 100 MG tablet 100mg  in AM and 200mg  in PM  . levofloxacin (LEVAQUIN) 500 MG tablet Take 1 tablet (500 mg total) by mouth daily.  . simvastatin (ZOCOR) 20 MG tablet Take 20 mg by mouth every evening.  . valsartan (DIOVAN) 160 MG tablet Take 1 tablet (160 mg total) by mouth daily.   No facility-administered encounter medications on file as of 01/23/2018.      Patient Active Problem List   Diagnosis Date Noted  . Malignant neoplasm of prostate ([place]) 06/24/2014  . Hyponatremia 02/26/2014  . Postoperative anemia due to acute blood loss 02/26/2014  . OA (osteoarthritis) of knee 02/24/2014  . Dizziness 12/04/2011  . Hyperlipidemia LDL goal < 100 10/05/2011  . 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 CVA (cerebral infarction) 10/05/2011  . 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 HTN (hypertension) 09/30/2011  . Seizure disorder ([place]) 09/30/2011     Health Maintenance Due  Topic Date Due  . Hepatitis C Screening  [birth year]  . TETANUS/TDAP  02/10/1967  . COLONOSCOPY  02/09/1998  . PNA vac Low Risk Adult (1 of 2 - PCV13) 02/09/2013      Review of Systems + left sided jaw numbness and slight swelling (chronic) otherwise 12 point ros is negative Physical Exam   BP (!) 187/103   Pulse 73   Temp 97.6 F (36.4 C) (Oral)   Ht 5\' 11"  (1.803 m)   Wt 210 lb (95.3 kg)   BMI 29.29 kg/m   Physical Exam  Constitutional: He is oriented to person, place, and time. He appears well-developed and well-nourished. No distress.  HENT:  Mouth/Throat: Oropharynx is clear and moist. No oropharyngeal exudate. Poor dentition from radiation but no areas appeared inflammed Skin: Skin is warm and dry. No rash noted. No erythema.  Psychiatric: He has a normal mood and affect. His behavior is normal.     CBC Lab Results  Component Value Date   WBC 10.0 01/09/2018   RBC 4.48 01/09/2018   HGB 12.5 (L) 01/09/2018   HCT 35.7 (L) 01/09/2018   PLT 301 01/09/2018   MCV 79.7 (L) 01/09/2018   MCH 27.9 01/09/2018   MCHC 35.0 01/09/2018   RDW 12.3 01/09/2018   LYMPHSABS 1,730 01/09/2018   MONOABS 1.3 (H) 10/06/2011   EOSABS 220 01/09/2018    BMET Lab Results  Component Value Date   NA 133 (L) 12/25/2017   K 4.9 12/25/2017   CL 94 (L) 12/25/2017   CO2 29 12/25/2017   GLUCOSE 123 (H) 12/25/2017   BUN 19 12/25/2017   CREATININE 1.14  12/25/2017   CALCIUM 10.1 12/25/2017   GFRNONAA 65 12/25/2017   GFRAA 76 12/25/2017    Lab Results  Component Value Date   ESRSEDRATE 22 (H) 12/25/2017   Lab Results  Component Value Date   CRP 6.2 12/25/2017     Assessment and Plan  Jaw osteo = will see dr Buelah Manis later today.labs from early march show sed rate of 22 and crp 6.2 much improved ( in November 69)  htn = will repeat BP- sbp of 165/102, which has been in the 937-169C systolic during our visits. Will add amlodipine 10mg  to start with 5 mg - and follow up with his pcp  Continue on abtx and give refills. See back in 4-6wk

## 2018-01-26 DIAGNOSIS — C61 Malignant neoplasm of prostate: Secondary | ICD-10-CM | POA: Diagnosis not present

## 2018-01-26 DIAGNOSIS — Z5111 Encounter for antineoplastic chemotherapy: Secondary | ICD-10-CM | POA: Diagnosis not present

## 2018-01-26 DIAGNOSIS — C775 Secondary and unspecified malignant neoplasm of intrapelvic lymph nodes: Secondary | ICD-10-CM | POA: Diagnosis not present

## 2018-02-07 ENCOUNTER — Ambulatory Visit: Payer: PPO | Admitting: Diagnostic Neuroimaging

## 2018-03-05 ENCOUNTER — Encounter: Payer: Self-pay | Admitting: Diagnostic Neuroimaging

## 2018-03-05 ENCOUNTER — Ambulatory Visit (INDEPENDENT_AMBULATORY_CARE_PROVIDER_SITE_OTHER): Payer: PPO | Admitting: Diagnostic Neuroimaging

## 2018-03-05 VITALS — BP 124/71 | HR 74 | Ht 71.0 in | Wt 205.8 lb

## 2018-03-05 DIAGNOSIS — C7951 Secondary malignant neoplasm of bone: Secondary | ICD-10-CM | POA: Diagnosis not present

## 2018-03-05 DIAGNOSIS — G252 Other specified forms of tremor: Secondary | ICD-10-CM

## 2018-03-05 DIAGNOSIS — G40909 Epilepsy, unspecified, not intractable, without status epilepticus: Secondary | ICD-10-CM | POA: Diagnosis not present

## 2018-03-05 DIAGNOSIS — C61 Malignant neoplasm of prostate: Secondary | ICD-10-CM | POA: Diagnosis not present

## 2018-03-05 DIAGNOSIS — I63113 Cerebral infarction due to embolism of bilateral vertebral arteries: Secondary | ICD-10-CM

## 2018-03-05 DIAGNOSIS — Z5111 Encounter for antineoplastic chemotherapy: Secondary | ICD-10-CM | POA: Diagnosis not present

## 2018-03-05 MED ORDER — LAMOTRIGINE 100 MG PO TABS: ORAL_TABLET | ORAL | 4 refills | Status: DC

## 2018-03-05 NOTE — Progress Notes (Signed)
[facility]  PATIENT: Connor [name] DOB: 1946-06-25  REFERRING CLINICIAN:  HISTORY FROM: patient  REASON FOR VISIT: follow up (former [name] pt)   HISTORICAL  CHIEF COMPLAINT:  Chief Complaint  Patient presents with  . Follow-up  . Seizures    Has L jaw infection (treated with IV and now oral).  Also taking hormone injection for Prostate Ca.  Marland Kitchen Hx CVA    HISTORY OF PRESENT ILLNESS:   UPDATE (03/05/18, [facility]): Since last visit, doing well. Tolerating meds. No alleviating or aggravating factors. No seizures.  UPDATE 02/01/17: Since last visit, PSA has gone up and may need more treatment. No seizures. Has noticed fine tremor in bilateral hands x 6-8 months. Paternal grandfather and uncle had parkinsons.   UPDATE 11/17/15: Since last visit, doing well. No seizures. Some mild balance issues across open parking lots, but no major falls or problems. Completed prostate radiation.   UPDATE 10/13/14: Since last visit, now on prostate CA therapy (radiation and hormone). No seizures. No new stroke like events. Overall stable neurologically.   UPDATE 10/11/13: Since last visit, stable. No seizures since Dec 2012. Still with some balance difficulty. Tolerating LTG.  PRIOR HPI (12/17/11, [name]): 72 year old right-handed white divorced male with a history of episodes in which his mind would suddenly blankout beginning in 1984.  It would be an unusual sensation and during the episodes he could not respond. He had an episode while on a plane trip to [facility] 10/1992 with a weird sensation and then a generalized major motor seizure.  The plane landed in [place]  and he was evaluated at the [facility]. CAT scan without dye, EKG, CBC, SMA 6, SMA 12, magnesium level were normal and  he was placed on Dilantin 300 mg per day. A second generalized major motor seizure occurred 04/1993 and  I first saw him 05/31/1993. He was drinking 42 beers per week. Because the seizures were  simple partial and complex partial with secondary generalization. It was felt his seizures might not  be related to alcohol. 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 He was placed on Dilantin medication and subsequently switched to Lamictal in 2006. He  tried to taper lamotrigine several times and  had generalized major motor seizures. This occurred in 2009. He had a CAT scan for evaluation and a bone density scan 12/06/2004. The foreign body was removed from the right orbit . He previously had an MRI of his lumbar spine 12/13/2006 showing severe canal stenosis at L4-5 anterior listhesis of 4-5 mm and severe facet hypertrophy and a large synovial cyst emanating from the left facet joint. There were also degenerative changes on the right at L5-S1 and facet hypertrophy at L5-S1. There was  disc bulging at L3-4.  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 He underwent radiation therapy and chemotherapy at [facility]. CT of the neck with contrast 07/12/11 showed no evidence of recurrent  disease. 7/12 he had a nocturnal generalized seizure. He awoke at night having bitten his tongue.  He had been trying to taper lamotrigine. 12/13/10 while [place] and drinking beer regularly during the  the day he had a seizure. 08/29/11 he had  another  seizure.  In November, he  had persistent left-sided headache occurring on a daily basis. Wednesday 09/28/11, he was eating breakfast with friends in a restaurant and noted the onset of the left sided headache with numbness in the left forehead extending down the left side of his face and then involving his entire body with a  tingling sensation or numbness,worse on the left side of his body than the right. He became weak in both arms and both legs  and slumped in his chair.  911 was called and he was seen at [facility] emergency room. CT scan of the brain without contrast was unremarkable and MRI study of the brain without and with contrast which was reviewed by Dr. Patton Salles was negative for acute stroke. At home he noted falling to his left, double vision, and oscillopsia. He did  have hiccups. He noted difficulty in voiding.I saw in 09/30/2011 and he was admitted to [facility] with MRI showing left medullary and right inferior cerebellar ischemic strokes.  CT angiogram showed no evidence of posterior circulation stenosis or occlusion and TEE showed normal EF and no emboli source. In the hospital he had a seizure and Lamictal was increas to 100 mg AM and 200 mg PM.He was improving  but 2/9 after having one beer he noticed dizziness and blurred vision  with vertigo and decreased balance. He was seen in the [facility] ER 12/04/11 MRI showing no evidence of acute stroke. He has slowly improved. He denies palpitations. He has spinning, double vision, hiccups,numbness left face and right body, and balance problems. He denies auras of seizures.  REVIEW OF SYSTEMS: Full 14 system review of systems performed and notable only for double vision eye pain trouble swallowing gait diff memory loss.   ALLERGIES: Allergies  Allergen Reactions  . Morphine And Related Nausea And Vomiting    HOME MEDICATIONS: Outpatient Medications Prior to Visit  Medication Sig Dispense Refill  . amLODipine (NORVASC) 10 MG tablet   11  . aspirin 81 MG tablet Take 81 mg by mouth daily.    . bicalutamide (CASODEX) 50 MG tablet TK 1 T PO D  5  . chlorhexidine (PERIDEX) 0.12 % solution SWISH 15 ML PO FOR 30 SECONDS THEN SPIT BID  0  . fluconazole (DIFLUCAN) 200 MG tablet Take 2 tablets (400 mg total) by mouth daily. 60 tablet 2  . hydrochlorothiazide (HYDRODIURIL) 12.5 MG tablet Take 1 tablet (12.5 mg total) by mouth daily. 30 tablet 11  . lamoTRIgine (LAMICTAL) 100 MG tablet 100mg  in AM  and 200mg  in PM 270 tablet 4  . levofloxacin (LEVAQUIN) 500 MG tablet Take 1 tablet (500 mg total) by mouth daily. 30 tablet 2  . simvastatin (ZOCOR) 20 MG tablet Take 20 mg by mouth every evening.    Marland Kitchen UNKNOWN TO PATIENT Hormone injection from [facility] every 30 days.    . valsartan (DIOVAN) 160 MG tablet Take 1 tablet (160 mg total) by mouth daily. 30 tablet 11   No facility-administered medications prior to visit.     PAST MEDICAL HISTORY: Past Medical History:  Diagnosis Date  . Arthritis    OA AND PAIN RT KNEE  . Cancer ([place])    h/o neck - ABOUT 6 YRS AGO - TX'D WITH RADIATION AND CHEMO   . Chronic kidney disease   . Head and neck cancer ~ 2009   S/P radiation & [name]  . Headache(784.0)   . Hyperlipidemia   . Hypertension   .  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 Prostate cancer ([place]) 05/20/14   Gleason 4+3=7, volume 25 gm  . Radiation 2015   hx of, prostate cancer  . Seizures ([place])    hx of x yrs, "the bad kind;bite tongue; STATES LAST [place] 2013; WAS SEEING [name] - HE RETIRED AND PT LAST SAW DR. [name]  . Stroke ([place]) [place] 2013   UNABLE TO SPEAK OR MOVE AND RT SIDE WEAKNESS AND LOSS OF SKIN SENSITIVITY TO HEAT AND COLD ON RT SIDE, DOUBLE VISION. BALANCE PROBLEMS---STATES STILL HAS DOUBLE VISION AND BALANCE PROBLEM AND RT SIDED LOSS OF SKIN SENSITIVITY    PAST SURGICAL HISTORY: Past Surgical History:  Procedure Laterality Date  . hydrocelectomy  11/2000   left  . IR FLUORO GUIDE CV LINE RIGHT  08/31/2017  . IR US GUIDE VASC ACCESS RIGHT  08/31/2017  . JOINT REPLACEMENT     LEFT TOTAL KNEE ARTHROPLASTY  . NEPHRECTOMY  1990's   left  . PROSTATE BIOPSY  05/20/14   Gleason 4+3=7, vol 25 gm  . TEE WITHOUT CARDIOVERSION  10/04/2011   Procedure: TRANSESOPHAGEAL ECHOCARDIOGRAM (TEE);  Surgeon: Candee Furbish, MD;  Location: [facility] ENDOSCOPY;  Service: Cardiovascular;  Laterality: N/A;  . TOTAL KNEE ARTHROPLASTY  2011   left    . TOTAL KNEE ARTHROPLASTY Right 02/24/2014   Procedure: RIGHT TOTAL KNEE ARTHROPLASTY;  Surgeon: Gearlean Alf, MD;  Location: [facility] ORS;  Service: Orthopedics;  Laterality: Right;    FAMILY HISTORY: Family History  Problem Relation Age of Onset  . Diabetes Mother   . Heart disease Mother   . Heart disease Father     SOCIAL HISTORY:  Social History   Socioeconomic History  . Marital status: Divorced    Spouse name: Not on file  . Number of children: 2  . Years of education: 12th  . Highest education level: Not on file  Occupational History  . Occupation: Information systems manager: OTHER    Comment: [employer]  Social Needs  . Financial resource strain: Not on file  . Food insecurity:    Worry: Not on file    Inability: Not on file  . Transportation needs:    Medical: Not on file    Non-medical: Not on file  Tobacco Use  . Smoking status: Never Smoker  . Smokeless tobacco: Never Used  Substance and Sexual Activity  . Alcohol use: Yes    Alcohol/week: 0.0 oz    Comment: WAS 6 TO 8 BEERS DAILY   . Drug use: No  . Sexual activity: Not Currently  Lifestyle  . Physical activity:    Days per week: Not on file    Minutes per session: Not on file  . Stress: Not on file  Relationships  . Social connections:    Talks on phone: Not on file    Gets together: Not on file    Attends religious service: Not on file    Active member of club or organization: Not on file    Attends meetings of clubs or organizations: Not on file    Relationship status: Not on file  . Intimate partner violence:    Fear of current or ex partner: Not on file    Emotionally abused: Not on file    Physically abused: Not on file    Forced sexual activity: Not on file  Other Topics Concern  . Not on  file  Social History Narrative   Patient is divorced with 2 children.   Patient is right handed.   Patient has hs education.   Patient drinks 2 cups daily.     PHYSICAL EXAM  Vitals:    03/05/18 1357  BP: 124/71  Pulse: 74  Weight: 205 lb 12.8 oz (93.4 kg)  Height: 5\' 11"  (1.803 m)    Not recorded      Body mass index is 28.7 kg/m.  GENERAL EXAM: Patient is in no distress; well developed, nourished and groomed; neck is supple  CARDIOVASCULAR: Regular rate and rhythm, no murmurs, no carotid bruits  NEUROLOGIC: MENTAL STATUS: awake, alert, oriented to person, place and time, recent and remote memory intact, normal attention and concentration, language fluent, comprehension intact, naming intact, fund of knowledge appropriate CRANIAL NERVE: no papilledema on fundoscopic exam, pupils reactive (RIGHT 3, LEFT 2), visual fields full to confrontation, extraocular muscles intact, BLURRED VISION IN RIGHT OR LEFT GAZE. Facial sensation SYMM; facial strength symmetric, hearing intact, palate elevates symmetrically, uvula midline, shoulder shrug symmetric, TONGUE DEVIATES TO THE LEFT. MILD HOARSE VOICE MOTOR: normal bulk and tone, full strength in the BUE, BLE; MILD POSTURAL TREMOR IN BUE; MILD REST TREMOR WITH CONTRALATERAL RAM; MILD BRADYKINESIA SENSORY: DECR IN RIGHT ARM/LEG TO TEMP. VIB NORMAL. COORDINATION: DYSMETRIA IN LUE > RUE. REFLEXES: deep tendon reflexes present and symmetric GAIT/STATION: SLIGHTLY SLOW GAIT.    DIAGNOSTIC DATA (LABS, IMAGING, TESTING) - I reviewed patient records, labs, notes, testing and imaging myself where available.  Lab Results  Component Value Date   WBC 10.0 01/09/2018   HGB 12.5 (L) 01/09/2018   HCT 35.7 (L) 01/09/2018   MCV 79.7 (L) 01/09/2018   PLT 301 01/09/2018      Component Value Date/Time   NA 133 (L) 12/25/2017 1415   K 4.9 12/25/2017 1415   CL 94 (L) 12/25/2017 1415   CO2 29 12/25/2017 1415   GLUCOSE 123 (H) 12/25/2017 1415   BUN 19 12/25/2017 1415   CREATININE 1.14 12/25/2017 1415   CALCIUM 10.1 12/25/2017 1415   PROT 7.3 12/25/2017 1415   ALBUMIN 4.4 02/17/2014 1350   AST 18 12/25/2017 1415   ALT 10 12/25/2017  1415   ALKPHOS 87 02/17/2014 1350   BILITOT 0.4 12/25/2017 1415   GFRNONAA 65 12/25/2017 1415   GFRAA 76 12/25/2017 1415   Lab Results  Component Value Date   CHOL 149 12/05/2011   HDL 56 12/05/2011   LDLCALC 68 12/05/2011   TRIG 123 12/05/2011   CHOLHDL 2.7 12/05/2011   Lab Results  Component Value Date   HGBA1C 5.3 12/05/2011   No results found for: [id number] Lab Results  Component Value Date   TSH 1.207 12/05/2011    12/05/11 MRI brain 1. No acute intracranial abnormality.  2. Subtle left medullary and right inferior cerebellar encephalomalacia corresponding to the December infarcts.   12/07/11 MRA head  - Mildly degraded by motion artifact. Negative intracranial MRA.  10/04/11 TEE  - No cardiac source of emboli was indentified.    ASSESSMENT AND PLAN  72 y.o. year old male here with history of seizure since 72. 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 Overall stable. Last seizure in 2012. 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 lamoTRIgine (LAMICTAL) 100 MG tablet    Sig: 100mg  in AM and 200mg  in PM    Dispense:  270 tablet    Refill:  4   Return in about 1 year (around 03/06/2019).    Penni Bombard, MD 9/50/7225, 7:50 PM Certified in Neurology, Neurophysiology and Neuroimaging  [facility] [address] [place], [place]  [address] [phone]

## 2018-03-06 ENCOUNTER — Ambulatory Visit (INDEPENDENT_AMBULATORY_CARE_PROVIDER_SITE_OTHER): Payer: PPO | Admitting: Internal Medicine

## 2018-03-06 VITALS — BP 162/81 | HR 81 | Temp 97.0°F | Wt 207.0 lb

## 2018-03-06 DIAGNOSIS — Z79899 Other long term (current) drug therapy: Secondary | ICD-10-CM | POA: Diagnosis not present

## 2018-03-06 DIAGNOSIS — M272 Inflammatory conditions of jaws: Secondary | ICD-10-CM | POA: Diagnosis not present

## 2018-03-06 DIAGNOSIS — I10 Essential (primary) hypertension: Secondary | ICD-10-CM | POA: Diagnosis not present

## 2018-03-10 NOTE — Progress Notes (Signed)
RFV: jaw osteo  Patient ID: Connor [name], male   DOB: 10-Apr-1946, 72 y.o.   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 He is currently on levofloxacin and fluconazole for which he is tolerating sans diarrhea. Still has some residual facial numbness but otherwise doing well. He reports of late having difficulty with BP management. He has not picked up norvasc since he has had BP in 120s at home  Outpatient Encounter Medications as of 03/06/2018  Medication Sig  . amLODipine (NORVASC) 10 MG tablet   . aspirin 81 MG tablet Take 81 mg by mouth daily.  . bicalutamide (CASODEX) 50 MG tablet TK 1 T PO D  . chlorhexidine (PERIDEX) 0.12 % solution SWISH 15 ML PO FOR 30 SECONDS THEN SPIT BID  . fluconazole (DIFLUCAN) 200 MG tablet Take 2 tablets (400 mg total) by mouth daily.  . hydrochlorothiazide (HYDRODIURIL) 12.5 MG tablet Take 1 tablet (12.5 mg total) by mouth daily.  Marland Kitchen lamoTRIgine (LAMICTAL) 100 MG tablet 100mg  in AM and 200mg  in PM  . levofloxacin (LEVAQUIN) 500 MG tablet Take 1 tablet (500 mg total) by mouth daily.  . simvastatin (ZOCOR) 20 MG tablet Take 20 mg by mouth every evening.  Marland Kitchen UNKNOWN TO PATIENT Hormone injection from [facility] every 30 days.  . valsartan (DIOVAN) 160 MG tablet Take 1 tablet (160 mg total) by mouth daily.   No facility-administered encounter medications on file as of 03/06/2018.      Patient Active Problem List   Diagnosis Date Noted  . Malignant neoplasm of prostate ([place]) 06/24/2014  . Hyponatremia 02/26/2014  . Postoperative anemia due to acute blood loss 02/26/2014  . OA (osteoarthritis) of knee 02/24/2014  . Dizziness 12/04/2011  . Hyperlipidemia LDL goal < 100 10/05/2011  . 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 CVA (cerebral infarction) 10/05/2011  . 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 HTN (hypertension) 09/30/2011  . Seizure disorder ([place]) 09/30/2011     Health Maintenance  Due  Topic Date Due  . Hepatitis C Screening  05/13/1946  . TETANUS/TDAP  02/10/1967  . COLONOSCOPY  02/09/1998  . PNA vac Low Risk Adult (1 of 2 - PCV13) 02/09/2013     Review of Systems Review of Systems  Constitutional: Negative for fever, chills, diaphoresis, activity change, appetite change, fatigue and unexpected weight change.  HENT: Negative for congestion, sore throat, rhinorrhea, sneezing, trouble swallowing and sinus pressure.  Eyes: Negative for photophobia and visual disturbance.  Respiratory: Negative for cough, chest tightness, shortness of breath, wheezing and stridor.  Cardiovascular: Negative for chest pain, palpitations and leg swelling.  Gastrointestinal: Negative for nausea, vomiting, abdominal pain, diarrhea, constipation, blood in stool, abdominal distention and anal bleeding.  Genitourinary: Negative for dysuria, hematuria, flank pain and difficulty urinating.  Musculoskeletal: Negative for myalgias, back pain, joint swelling, arthralgias and gait problem.  Skin: Negative for color change, pallor, rash and wound.  Neurological: Negative for dizziness, tremors, weakness and light-headedness.  Hematological: Negative for adenopathy. Does not bruise/bleed easily.  Psychiatric/Behavioral: Negative for behavioral problems, confusion, sleep disturbance, dysphoric mood, decreased concentration and agitation.    Physical Exam   BP (!) 162/81   Pulse 81   Temp (!) 97 F (36.1 C) (Oral)   Wt 207 lb (93.9 kg)   BMI 28.87 kg/m   Physical Exam  Constitutional: He is oriented to person, place, and time. He appears well-developed and well-nourished. No  distress.  HENT:  Mouth/Throat: Oropharynx is clear and moist. No oropharyngeal exudate.  Cardiovascular: Normal rate, regular rhythm and normal heart sounds. Exam reveals no gallop and no friction rub.  No murmur heard.  Pulmonary/Chest: Effort normal and breath sounds normal. No respiratory distress. He has no wheezes.   Abdominal: Soft. Bowel sounds are normal. He exhibits no distension. There is no tenderness.  Lymphadenopathy:  He has no cervical adenopathy.  Neurological: He is alert and oriented to person, place, and time.  Skin: Skin is warm and dry. No rash noted. No erythema.  Psychiatric: He has a normal mood and affect. His behavior is normal.    CBC Lab Results  Component Value Date   WBC 10.0 01/09/2018   RBC 4.48 01/09/2018   HGB 12.5 (L) 01/09/2018   HCT 35.7 (L) 01/09/2018   PLT 301 01/09/2018   MCV 79.7 (L) 01/09/2018   MCH 27.9 01/09/2018   MCHC 35.0 01/09/2018   RDW 12.3 01/09/2018   LYMPHSABS 1,730 01/09/2018   MONOABS 1.3 (H) 10/06/2011   EOSABS 220 01/09/2018    BMET Lab Results  Component Value Date   NA 133 (L) 12/25/2017   K 4.9 12/25/2017   CL 94 (L) 12/25/2017   CO2 29 12/25/2017   GLUCOSE 123 (H) 12/25/2017   BUN 19 12/25/2017   CREATININE 1.14 12/25/2017   CALCIUM 10.1 12/25/2017   GFRNONAA 65 12/25/2017   GFRAA 76 12/25/2017   Lab Results  Component Value Date   ESRSEDRATE 22 (H) 12/25/2017      Assessment and Plan  Jaw osteo = 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 Will see back in 4-6wk  Long term medication management = appears to be tolerating medications  htn = defer to his PCP for management

## 2018-03-12 DIAGNOSIS — I69993 Ataxia following unspecified cerebrovascular disease: Secondary | ICD-10-CM | POA: Diagnosis not present

## 2018-03-12 DIAGNOSIS — Z0000 Encounter for general adult medical examination without abnormal findings: Secondary | ICD-10-CM | POA: Diagnosis not present

## 2018-03-12 DIAGNOSIS — Z1159 Encounter for screening for other viral diseases: Secondary | ICD-10-CM | POA: Diagnosis not present

## 2018-03-12 DIAGNOSIS — E782 Mixed hyperlipidemia: Secondary | ICD-10-CM | POA: Diagnosis not present

## 2018-03-12 DIAGNOSIS — Z1389 Encounter for screening for other disorder: Secondary | ICD-10-CM | POA: Diagnosis not present

## 2018-03-12 DIAGNOSIS — C61 Malignant neoplasm of prostate: Secondary | ICD-10-CM | POA: Diagnosis not present

## 2018-03-12 DIAGNOSIS — G8194 Hemiplegia, unspecified affecting left nondominant side: Secondary | ICD-10-CM | POA: Diagnosis not present

## 2018-03-12 DIAGNOSIS — I639 Cerebral infarction, unspecified: Secondary | ICD-10-CM | POA: Diagnosis not present

## 2018-03-12 DIAGNOSIS — Z8589 Personal history of malignant neoplasm of other organs and systems: Secondary | ICD-10-CM | POA: Diagnosis not present

## 2018-03-12 DIAGNOSIS — I10 Essential (primary) hypertension: Secondary | ICD-10-CM | POA: Diagnosis not present

## 2018-03-12 DIAGNOSIS — Z79899 Other long term (current) drug therapy: Secondary | ICD-10-CM | POA: Diagnosis not present

## 2018-03-12 DIAGNOSIS — R1310 Dysphagia, unspecified: Secondary | ICD-10-CM | POA: Diagnosis not present

## 2018-03-27 DIAGNOSIS — R1310 Dysphagia, unspecified: Secondary | ICD-10-CM | POA: Diagnosis not present

## 2018-03-27 DIAGNOSIS — R1313 Dysphagia, pharyngeal phase: Secondary | ICD-10-CM | POA: Diagnosis not present

## 2018-03-27 DIAGNOSIS — M272 Inflammatory conditions of jaws: Secondary | ICD-10-CM | POA: Insufficient documentation

## 2018-03-27 DIAGNOSIS — C099 Malignant neoplasm of tonsil, unspecified: Secondary | ICD-10-CM | POA: Diagnosis not present

## 2018-03-30 ENCOUNTER — Other Ambulatory Visit: Payer: Self-pay | Admitting: Otolaryngology

## 2018-03-30 DIAGNOSIS — R1310 Dysphagia, unspecified: Secondary | ICD-10-CM

## 2018-04-04 DIAGNOSIS — C61 Malignant neoplasm of prostate: Secondary | ICD-10-CM | POA: Diagnosis not present

## 2018-04-04 DIAGNOSIS — C7951 Secondary malignant neoplasm of bone: Secondary | ICD-10-CM | POA: Diagnosis not present

## 2018-04-05 ENCOUNTER — Other Ambulatory Visit: Payer: PPO

## 2018-04-05 DIAGNOSIS — C775 Secondary and unspecified malignant neoplasm of intrapelvic lymph nodes: Secondary | ICD-10-CM | POA: Diagnosis not present

## 2018-04-05 DIAGNOSIS — Z79899 Other long term (current) drug therapy: Secondary | ICD-10-CM | POA: Diagnosis not present

## 2018-04-05 DIAGNOSIS — C61 Malignant neoplasm of prostate: Secondary | ICD-10-CM | POA: Diagnosis not present

## 2018-04-05 DIAGNOSIS — R5383 Other fatigue: Secondary | ICD-10-CM | POA: Diagnosis not present

## 2018-04-06 ENCOUNTER — Ambulatory Visit
Admission: RE | Admit: 2018-04-06 | Discharge: 2018-04-06 | Disposition: A | Discharge status: Home / Self Care | Payer: PPO | Source: Ambulatory Visit | Attending: Otolaryngology | Admitting: Otolaryngology

## 2018-04-06 DIAGNOSIS — K449 Diaphragmatic hernia without obstruction or gangrene: Secondary | ICD-10-CM | POA: Diagnosis not present

## 2018-04-06 DIAGNOSIS — R1310 Dysphagia, unspecified: Secondary | ICD-10-CM

## 2018-05-03 ENCOUNTER — Other Ambulatory Visit: Payer: Self-pay

## 2018-05-03 ENCOUNTER — Encounter: Payer: Self-pay | Admitting: Internal Medicine

## 2018-05-03 ENCOUNTER — Ambulatory Visit (INDEPENDENT_AMBULATORY_CARE_PROVIDER_SITE_OTHER): Payer: PPO | Admitting: Internal Medicine

## 2018-05-03 VITALS — BP 136/80 | HR 88 | Wt 207.0 lb

## 2018-05-03 DIAGNOSIS — I10 Essential (primary) hypertension: Secondary | ICD-10-CM

## 2018-05-03 DIAGNOSIS — C61 Malignant neoplasm of prostate: Secondary | ICD-10-CM

## 2018-05-03 DIAGNOSIS — M272 Inflammatory conditions of jaws: Secondary | ICD-10-CM | POA: Diagnosis not present

## 2018-05-03 NOTE — Progress Notes (Signed)
RFV: jaw osteo  Patient ID: Connor [name], male   DOB: [birth year], 72 y.o.   MRN: [id number]  HPI Connor [name] is 72 yo M with hx of head neck ca s/p chemo, surgery and radiation to his jaw. This has been complicated with late development of jaw osteo. He has been on prolonged oral abtx with levofloxacin, and fluconazole. He has tolerated the abtx without difficulty. He states that he had ct of the jaw at dr Janeann Forehand office. He states that there is still some bone showing but still having elements of improvement. Dr Buelah Manis did mention the possibility of HBO therapy but did not know what implications would be in the setting of receiving treatment for prostate ca.  Still has numbness to chin, some swelling in the morning which improves.   Outpatient Encounter Medications as of 05/03/2018  Medication Sig  . amLODipine (NORVASC) 10 MG tablet   . aspirin 81 MG tablet Take 81 mg by mouth daily.  . Aspirin-Calcium Carbonate 81-777 MG TABS Take by mouth.  . bicalutamide (CASODEX) 50 MG tablet TK 1 T PO D  . chlorhexidine (PERIDEX) 0.12 % solution SWISH 15 ML PO FOR 30 SECONDS THEN SPIT BID  . fluconazole (DIFLUCAN) 200 MG tablet Take 2 tablets (400 mg total) by mouth daily.  . hydrochlorothiazide (HYDRODIURIL) 12.5 MG tablet Take 1 tablet (12.5 mg total) by mouth daily.  Marland Kitchen lamoTRIgine (LAMICTAL) 100 MG tablet 100mg  in AM and 200mg  in PM  . levofloxacin (LEVAQUIN) 500 MG tablet Take 1 tablet (500 mg total) by mouth daily.  . NONFORMULARY OR COMPOUNDED ITEM Hormone injection from [facility] every 30 days.  . simvastatin (ZOCOR) 20 MG tablet Take 20 mg by mouth every evening.  Marland Kitchen UNKNOWN TO PATIENT Hormone injection from [facility] every 30 days.  . valsartan (DIOVAN) 160 MG tablet Take 1 tablet (160 mg total) by mouth daily.   No facility-administered encounter medications on file as of 05/03/2018.      Patient Active Problem List   Diagnosis Date Noted  . Chronic osteomyelitis of mandible 03/27/2018    . Pharyngeal dysphagia 03/27/2018  . Malignant neoplasm of prostate ([place]) 06/24/2014  . Elevated PSA 06/03/2014  . Hyponatremia 02/26/2014  . Postoperative anemia due to acute blood loss 02/26/2014  . OA (osteoarthritis) of knee 02/24/2014  . Dizziness 12/04/2011  . Hyperlipidemia LDL goal < 100 10/05/2011  . 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 CVA (cerebral infarction) 10/05/2011  . 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 HTN (hypertension) 09/30/2011  . Seizure disorder ([place]) 09/30/2011     Health Maintenance Due  Topic Date Due  . Hepatitis C Screening  [birth year]  . TETANUS/TDAP  02/10/1967  . COLONOSCOPY  02/09/1998  . PNA vac Low Risk Adult (1 of 2 - PCV13) 02/09/2013     Review of Systems Fatigue from prostate ca Physical Exam   BP 136/80   Pulse 88   Wt 207 lb (93.9 kg)   BMI 28.87 kg/m   gen = a xo by 4 in nad heent = slight asymmetry to right side. Missing teeth but overall. Neck = scarring from prior surgery. No lymphadenopathy or tenderness CBC Lab Results  Component Value Date   WBC 10.0 01/09/2018   RBC 4.48 01/09/2018   HGB 12.5 (L) 01/09/2018   HCT 35.7 (L) 01/09/2018   PLT 301 01/09/2018   MCV 79.7 (L) 01/09/2018   MCH 27.9 01/09/2018   MCHC 35.0 01/09/2018   RDW 12.3 01/09/2018  LYMPHSABS 1,730 01/09/2018   MONOABS 1.3 (H) 10/06/2011   EOSABS 220 01/09/2018    BMET Lab Results  Component Value Date   NA 133 (L) 12/25/2017   K 4.9 12/25/2017   CL 94 (L) 12/25/2017   CO2 29 12/25/2017   GLUCOSE 123 (H) 12/25/2017   BUN 19 12/25/2017   CREATININE 1.14 12/25/2017   CALCIUM 10.1 12/25/2017   GFRNONAA 65 12/25/2017   GFRAA 76 12/25/2017      Assessment and Plan Jaw osteomyelitis = Continue on abtx Will see sed rate and crp Will get copy of ct scan t osee how he is doing Being evaluated for hbo for exposed bone  htn = well managed  Prostate ca = getting lupron injections presently. He reports his PSA is low.

## 2018-05-03 NOTE — Patient Instructions (Signed)
Please ask dr Buelah Manis - to send his CT results to [phone]

## 2018-05-04 LAB — C-REACTIVE PROTEIN: CRP: 2.3 mg/L

## 2018-05-04 LAB — SEDIMENTATION RATE: Sed Rate: 9 mm/h (ref 0–20)

## 2018-06-01 ENCOUNTER — Other Ambulatory Visit: Payer: Self-pay

## 2018-06-01 ENCOUNTER — Other Ambulatory Visit: Payer: Self-pay | Admitting: Internal Medicine

## 2018-06-01 DIAGNOSIS — M272 Inflammatory conditions of jaws: Secondary | ICD-10-CM

## 2018-06-01 MED ORDER — LEVOFLOXACIN 500 MG PO TABS: 500.0000 mg | ORAL_TABLET | Freq: Every day | ORAL | 2 refills | Status: DC

## 2018-06-01 MED ORDER — FLUCONAZOLE 200 MG PO TABS: 400.0000 mg | ORAL_TABLET | Freq: Every day | ORAL | 2 refills | Status: DC

## 2018-06-04 DIAGNOSIS — C775 Secondary and unspecified malignant neoplasm of intrapelvic lymph nodes: Secondary | ICD-10-CM | POA: Diagnosis not present

## 2018-06-04 DIAGNOSIS — C61 Malignant neoplasm of prostate: Secondary | ICD-10-CM | POA: Diagnosis not present

## 2018-06-05 DIAGNOSIS — C775 Secondary and unspecified malignant neoplasm of intrapelvic lymph nodes: Secondary | ICD-10-CM | POA: Diagnosis not present

## 2018-06-05 DIAGNOSIS — C61 Malignant neoplasm of prostate: Secondary | ICD-10-CM | POA: Diagnosis not present

## 2018-08-07 ENCOUNTER — Encounter: Payer: Self-pay | Admitting: Internal Medicine

## 2018-08-07 ENCOUNTER — Ambulatory Visit (INDEPENDENT_AMBULATORY_CARE_PROVIDER_SITE_OTHER): Payer: PPO | Admitting: Internal Medicine

## 2018-08-07 VITALS — BP 134/76 | HR 82 | Temp 97.8°F | Ht 69.0 in | Wt 203.0 lb

## 2018-08-07 DIAGNOSIS — M272 Inflammatory conditions of jaws: Secondary | ICD-10-CM

## 2018-08-07 NOTE — Progress Notes (Signed)
RTC for jaw osteomyelitis  Patient ID: Connor [name], male   DOB: 12/04/1945, 72 y.o.   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 He reports feeling better with  - repeat scan at end of July looks improved. Facial numbness along jaw of left side is unchanged.  Has lost taste of food Outpatient Encounter Medications as of 08/07/2018  Medication Sig  . amLODipine (NORVASC) 10 MG tablet   . aspirin 81 MG tablet Take 81 mg by mouth daily.  . Aspirin-Calcium Carbonate 81-777 MG TABS Take by mouth.  . bicalutamide (CASODEX) 50 MG tablet TK 1 T PO D  . chlorhexidine (PERIDEX) 0.12 % solution SWISH 15 ML PO FOR 30 SECONDS THEN SPIT BID  . fluconazole (DIFLUCAN) 200 MG tablet Take 2 tablets (400 mg total) by mouth daily.  . hydrochlorothiazide (HYDRODIURIL) 12.5 MG tablet Take 1 tablet (12.5 mg total) by mouth daily.  Marland Kitchen lamoTRIgine (LAMICTAL) 100 MG tablet 100mg  in AM and 200mg  in PM  . levofloxacin (LEVAQUIN) 500 MG tablet Take 1 tablet (500 mg total) by mouth daily.  . NONFORMULARY OR COMPOUNDED ITEM Hormone injection from [facility] every 30 days.  . simvastatin (ZOCOR) 20 MG tablet Take 20 mg by mouth every evening.  Marland Kitchen UNKNOWN TO PATIENT Hormone injection from [facility] every 30 days.  . valsartan (DIOVAN) 160 MG tablet Take 1 tablet (160 mg total) by mouth daily.   No facility-administered encounter medications on file as of 08/07/2018.      Patient Active Problem List   Diagnosis Date Noted  . Chronic osteomyelitis of mandible 03/27/2018  . Pharyngeal dysphagia 03/27/2018  . Malignant neoplasm of prostate ([place]) 06/24/2014  . Elevated PSA 06/03/2014  . Hyponatremia 02/26/2014  . Postoperative anemia due to acute blood loss 02/26/2014  . OA (osteoarthritis) of knee 02/24/2014  . Dizziness 12/04/2011  . Hyperlipidemia LDL goal < 100 10/05/2011  . 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 CVA (cerebral infarction) 10/05/2011  . Stroke,  acute, embolic ([place]) 61/60/7371  . HTN (hypertension) 09/30/2011  . Seizure disorder ([place]) 09/30/2011     Health Maintenance Due  Topic Date Due  . Hepatitis C Screening  [birth year]  . TETANUS/TDAP  02/10/1967  . COLONOSCOPY  02/09/1998  . PNA vac Low Risk Adult (1 of 2 - PCV13) 02/09/2013  . INFLUENZA VACCINE  05/24/2018     Review of Systems Review of Systems  Constitutional: Negative for fever, chills, diaphoresis, activity change, appetite change, fatigue and unexpected weight change.  HENT: Negative for congestion, sore throat, rhinorrhea, sneezing, trouble swallowing and sinus pressure.  Eyes: Negative for photophobia and visual disturbance.  Respiratory: Negative for cough, chest tightness, shortness of breath, wheezing and stridor.  Cardiovascular: Negative for chest pain, palpitations and leg swelling.  Gastrointestinal: Negative for nausea, vomiting, abdominal pain, diarrhea, constipation, blood in stool, abdominal distention and anal bleeding.  Genitourinary: Negative for dysuria, hematuria, flank pain and difficulty urinating.  Musculoskeletal: Negative for myalgias, back pain, joint swelling, arthralgias and gait problem.  Skin: Negative for color change, pallor, rash and wound.  Neurological: Negative for dizziness, tremors, weakness and light-headedness.  Hematological: Negative for adenopathy. Does not bruise/bleed easily.  Psychiatric/Behavioral: Negative for behavioral problems, confusion, sleep disturbance, dysphoric mood, decreased concentration and agitation.    Physical Exam   Ht 5\' 9"  (1.753 m)   Wt 203 lb (92.1 kg)   BMI 29.98 kg/m   Physical Exam  Constitutional: He  is oriented to person, place, and time. He appears well-developed and well-nourished. No distress.  HENT:  Mouth/Throat: Oropharynx is clear and moist. No oropharyngeal exudate.  Cardiovascular: Normal rate, regular rhythm and normal heart sounds. Exam reveals no gallop and no friction  rub.  No murmur heard.  Pulmonary/Chest: Effort normal and breath sounds normal. No respiratory distress. He has no wheezes.  Abdominal: Soft. Bowel sounds are normal. He exhibits no distension. There is no tenderness.  Lymphadenopathy:  He has no cervical adenopathy.  Neurological: He is alert and oriented to person, place, and time.  Skin: Skin is warm and dry. No rash noted. No erythema.  Psychiatric: He has a normal mood and affect. His behavior is normal.    CBC Lab Results  Component Value Date   WBC 10.0 01/09/2018   RBC 4.48 01/09/2018   HGB 12.5 (L) 01/09/2018   HCT 35.7 (L) 01/09/2018   PLT 301 01/09/2018   MCV 79.7 (L) 01/09/2018   MCH 27.9 01/09/2018   MCHC 35.0 01/09/2018   RDW 12.3 01/09/2018   LYMPHSABS 1,730 01/09/2018   MONOABS 1.3 (H) 10/06/2011   EOSABS 220 01/09/2018    BMET Lab Results  Component Value Date   NA 133 (L) 12/25/2017   K 4.9 12/25/2017   CL 94 (L) 12/25/2017   CO2 29 12/25/2017   GLUCOSE 123 (H) 12/25/2017   BUN 19 12/25/2017   CREATININE 1.14 12/25/2017   CALCIUM 10.1 12/25/2017   GFRNONAA 65 12/25/2017   GFRAA 76 12/25/2017      Assessment and Plan  - finish out his current prescription which is roughly 3 wks of abtx - then will monitor off of abtx - will check sed rate and crp today

## 2018-08-08 LAB — SEDIMENTATION RATE: Sed Rate: 9 mm/h (ref 0–20)

## 2018-08-08 LAB — C-REACTIVE PROTEIN: CRP: 9.3 mg/L — ABNORMAL HIGH (ref <8.0)

## 2018-09-04 DIAGNOSIS — C775 Secondary and unspecified malignant neoplasm of intrapelvic lymph nodes: Secondary | ICD-10-CM | POA: Diagnosis not present

## 2018-09-04 DIAGNOSIS — C61 Malignant neoplasm of prostate: Secondary | ICD-10-CM | POA: Diagnosis not present

## 2018-09-05 DIAGNOSIS — C775 Secondary and unspecified malignant neoplasm of intrapelvic lymph nodes: Secondary | ICD-10-CM | POA: Diagnosis not present

## 2018-09-05 DIAGNOSIS — C61 Malignant neoplasm of prostate: Secondary | ICD-10-CM | POA: Diagnosis not present

## 2018-10-05 DIAGNOSIS — C61 Malignant neoplasm of prostate: Secondary | ICD-10-CM | POA: Diagnosis not present

## 2018-10-05 DIAGNOSIS — C775 Secondary and unspecified malignant neoplasm of intrapelvic lymph nodes: Secondary | ICD-10-CM | POA: Diagnosis not present

## 2018-10-09 DIAGNOSIS — C61 Malignant neoplasm of prostate: Secondary | ICD-10-CM | POA: Diagnosis not present

## 2018-10-09 DIAGNOSIS — C775 Secondary and unspecified malignant neoplasm of intrapelvic lymph nodes: Secondary | ICD-10-CM | POA: Diagnosis not present

## 2018-10-15 ENCOUNTER — Ambulatory Visit (INDEPENDENT_AMBULATORY_CARE_PROVIDER_SITE_OTHER): Payer: PPO | Admitting: Internal Medicine

## 2018-10-15 ENCOUNTER — Encounter: Payer: Self-pay | Admitting: Internal Medicine

## 2018-10-15 VITALS — BP 160/81 | HR 88 | Temp 98.8°F | Wt 203.0 lb

## 2018-10-15 DIAGNOSIS — M272 Inflammatory conditions of jaws: Secondary | ICD-10-CM | POA: Diagnosis not present

## 2018-10-16 LAB — SEDIMENTATION RATE: Sed Rate: 11 mm/h (ref 0–20)

## 2018-10-19 NOTE — Progress Notes (Signed)
RFV: follow up for jaw osteomyelitis Patient ID: Connor [name], male   DOB: 01/28/1946, 72 y.o.   MRN: [id number]  HPI 72 yo M with hx of jaw osteomyelitis for which was on chronic suppression with amoxicillin which he finished and now being monitored off of oral abtx. He reports that he is doing well without worsening drainage, swelling or erythema. No recent illnesses other than finishing up treatment for UTI    Outpatient Encounter Medications as of 10/15/2018  Medication Sig  . amLODipine (NORVASC) 10 MG tablet   . aspirin 81 MG tablet Take 81 mg by mouth daily.  . Aspirin-Calcium Carbonate 81-777 MG TABS Take by mouth.  . bicalutamide (CASODEX) 50 MG tablet TK 1 T PO D  . chlorhexidine (PERIDEX) 0.12 % solution SWISH 15 ML PO FOR 30 SECONDS THEN SPIT BID  . fluconazole (DIFLUCAN) 200 MG tablet Take 2 tablets (400 mg total) by mouth daily.  . hydrochlorothiazide (HYDRODIURIL) 12.5 MG tablet Take 1 tablet (12.5 mg total) by mouth daily.  Marland Kitchen lamoTRIgine (LAMICTAL) 100 MG tablet 100mg  in AM and 200mg  in PM  . levofloxacin (LEVAQUIN) 500 MG tablet Take 1 tablet (500 mg total) by mouth daily.  . NONFORMULARY OR COMPOUNDED ITEM Hormone injection from [facility] every 30 days.  . simvastatin (ZOCOR) 20 MG tablet Take 20 mg by mouth every evening.  Marland Kitchen UNKNOWN TO PATIENT Hormone injection from [facility] every 30 days.  . valsartan (DIOVAN) 160 MG tablet Take 1 tablet (160 mg total) by mouth daily.   No facility-administered encounter medications on file as of 10/15/2018.      Patient Active Problem List   Diagnosis Date Noted  . Chronic osteomyelitis of mandible 03/27/2018  . Pharyngeal dysphagia 03/27/2018  . Malignant neoplasm of prostate ([place]) 06/24/2014  . Elevated PSA 06/03/2014  . Hyponatremia 02/26/2014  . Postoperative anemia due to acute blood loss 02/26/2014  . OA (osteoarthritis) of knee 02/24/2014  . Dizziness 12/04/2011  . Hyperlipidemia LDL goal < 100 10/05/2011  .  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 CVA (cerebral infarction) 10/05/2011  . 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 HTN (hypertension) 09/30/2011  . Seizure disorder ([place]) 09/30/2011     Health Maintenance Due  Topic Date Due  . Hepatitis C Screening  Apr 30, 1946  . TETANUS/TDAP  02/10/1967  . COLONOSCOPY  02/09/1998  . PNA vac Low Risk Adult (1 of 2 - PCV13) 02/09/2013  . INFLUENZA VACCINE  05/24/2018     Review of Systems  Physical Exam   BP (!) 160/81   Pulse 88   Temp 98.8 F (37.1 C) (Oral)   Wt 203 lb (92.1 kg)   BMI 29.98 kg/m    Physical Exam  Constitutional: He is oriented to person, place, and time. He appears well-developed and well-nourished. No distress.  HENT:  Mouth/Throat: Oropharynx is clear and moist. No oropharyngeal exudate. Poor dentition from radiaiton Lymphadenopathy:  He has no cervical adenopathy.  Neurological: He is alert and oriented to person, place, and time.  Skin: Skin is warm and dry. No rash noted. No erythema.  Psychiatric: He has a normal mood and affect. His behavior is normal.    CBC Lab Results  Component Value Date   WBC 10.0 01/09/2018   RBC 4.48 01/09/2018   HGB 12.5 (L) 01/09/2018   HCT 35.7 (L) 01/09/2018   PLT 301 01/09/2018   MCV 79.7 (L) 01/09/2018   MCH 27.9 01/09/2018   MCHC 35.0 01/09/2018  RDW 12.3 01/09/2018   LYMPHSABS 1,730 01/09/2018   MONOABS 1.3 (H) 10/06/2011   EOSABS 220 01/09/2018    BMET Lab Results  Component Value Date   NA 133 (L) 12/25/2017   K 4.9 12/25/2017   CL 94 (L) 12/25/2017   CO2 29 12/25/2017   GLUCOSE 123 (H) 12/25/2017   BUN 19 12/25/2017   CREATININE 1.14 12/25/2017   CALCIUM 10.1 12/25/2017   GFRNONAA 65 12/25/2017   GFRAA 76 12/25/2017   Lab Results  Component Value Date   ESRSEDRATE 11 10/15/2018      Assessment and Plan  Chronic jaw osteomyelitis = now off of abtx. Will check sed rate to see if still stable. Appears to have finished his course of treatment  for jaw osteomyelitis  rtc as needed

## 2018-12-07 DIAGNOSIS — C61 Malignant neoplasm of prostate: Secondary | ICD-10-CM | POA: Diagnosis not present

## 2018-12-07 DIAGNOSIS — C775 Secondary and unspecified malignant neoplasm of intrapelvic lymph nodes: Secondary | ICD-10-CM | POA: Diagnosis not present

## 2018-12-11 DIAGNOSIS — C775 Secondary and unspecified malignant neoplasm of intrapelvic lymph nodes: Secondary | ICD-10-CM | POA: Diagnosis not present

## 2018-12-11 DIAGNOSIS — C61 Malignant neoplasm of prostate: Secondary | ICD-10-CM | POA: Diagnosis not present

## 2018-12-17 DIAGNOSIS — I700 Atherosclerosis of aorta: Secondary | ICD-10-CM | POA: Diagnosis not present

## 2018-12-17 DIAGNOSIS — Z8546 Personal history of malignant neoplasm of prostate: Secondary | ICD-10-CM | POA: Diagnosis not present

## 2018-12-17 DIAGNOSIS — Z8589 Personal history of malignant neoplasm of other organs and systems: Secondary | ICD-10-CM | POA: Diagnosis not present

## 2018-12-17 DIAGNOSIS — Z8553 Personal history of malignant neoplasm of renal pelvis: Secondary | ICD-10-CM | POA: Diagnosis not present

## 2018-12-17 DIAGNOSIS — Z905 Acquired absence of kidney: Secondary | ICD-10-CM | POA: Diagnosis not present

## 2018-12-17 DIAGNOSIS — Z85841 Personal history of malignant neoplasm of brain: Secondary | ICD-10-CM | POA: Diagnosis not present

## 2018-12-17 DIAGNOSIS — C61 Malignant neoplasm of prostate: Secondary | ICD-10-CM | POA: Diagnosis not present

## 2018-12-17 DIAGNOSIS — R918 Other nonspecific abnormal finding of lung field: Secondary | ICD-10-CM | POA: Diagnosis not present

## 2018-12-18 DIAGNOSIS — C775 Secondary and unspecified malignant neoplasm of intrapelvic lymph nodes: Secondary | ICD-10-CM

## 2018-12-18 DIAGNOSIS — C61 Malignant neoplasm of prostate: Secondary | ICD-10-CM

## 2019-01-14 ENCOUNTER — Ambulatory Visit: Payer: PPO | Admitting: Internal Medicine

## 2019-02-05 ENCOUNTER — Ambulatory Visit (INDEPENDENT_AMBULATORY_CARE_PROVIDER_SITE_OTHER): Payer: PPO | Admitting: Family

## 2019-02-05 ENCOUNTER — Other Ambulatory Visit: Payer: Self-pay

## 2019-02-05 ENCOUNTER — Encounter: Payer: Self-pay | Admitting: Family

## 2019-02-05 DIAGNOSIS — M272 Inflammatory conditions of jaws: Secondary | ICD-10-CM | POA: Diagnosis not present

## 2019-02-05 NOTE — Progress Notes (Signed)
Subjective:    Patient ID: Connor [name], male    DOB: Oct 23, 1946, 73 y.o.   MRN: [id number]  Chief Complaint  Patient presents with  . Follow-up    Chronic ostiomyelitis of mandible     Virtual Visit via Telephone Note   I connected with Connor [name] on 02/05/2019 at 1:45 pm by telephone and verified that I am speaking with the correct person using two identifiers.   I discussed the limitations, risks, security and privacy concerns of performing an evaluation and management service by telephone and the availability of in person appointments. I also discussed with the patient that there may be a patient responsible charge related to this service. The patient expressed understanding and agreed to proceed.   HPI:  Connor [name] is a 73 y.o. male who was previously seen on 10/15/18 with history of head/neck cancer s/p chemotherapy and complicated by development of jaw osteomyelitis. He was treated with levofloxacin and fluconazole and completed chronic suppression for with amoxicillin. MOst recent sedimentation rate was 11.   Everything is holding well. No pain, swelling or fevers. Eating and drinking adequately.  Allergies  Allergen Reactions  . Morphine And Related Nausea And Vomiting      Outpatient Medications Prior to Visit  Medication Sig Dispense Refill  . amLODipine (NORVASC) 10 MG tablet   11  . aspirin 81 MG tablet Take 81 mg by mouth daily.    . Aspirin-Calcium Carbonate 81-777 MG TABS Take by mouth.    . bicalutamide (CASODEX) 50 MG tablet TK 1 T PO D  5  . chlorhexidine (PERIDEX) 0.12 % solution SWISH 15 ML PO FOR 30 SECONDS THEN SPIT BID  0  . fluconazole (DIFLUCAN) 200 MG tablet Take 2 tablets (400 mg total) by mouth daily. 60 tablet 2  . hydrochlorothiazide (HYDRODIURIL) 12.5 MG tablet Take 1 tablet (12.5 mg total) by mouth daily. 30 tablet 11  . lamoTRIgine (LAMICTAL) 100 MG tablet 100mg  in AM and 200mg  in PM 270 tablet 4  . NONFORMULARY OR  COMPOUNDED ITEM Hormone injection from [facility] every 30 days.    . simvastatin (ZOCOR) 20 MG tablet Take 20 mg by mouth every evening.    Marland Kitchen UNKNOWN TO PATIENT Hormone injection from [facility] every 30 days.    . valsartan (DIOVAN) 160 MG tablet Take 1 tablet (160 mg total) by mouth daily. 30 tablet 11  . levofloxacin (LEVAQUIN) 500 MG tablet Take 1 tablet (500 mg total) by mouth daily. (Patient not taking: Reported on 02/05/2019) 30 tablet 2   No facility-administered medications prior to visit.      Past Medical History:  Diagnosis Date  . Arthritis    OA AND PAIN RT KNEE  . Cancer ([place])    h/o neck - ABOUT 6 YRS AGO - TX'D WITH RADIATION AND CHEMO   . Chronic kidney disease   . Head and neck cancer ~ 2009   S/P radiation & [name]  . Headache(784.0)   . Hyperlipidemia   . Hypertension   . Kidney carcinoma ([place])    h/o - NEPHRECTOMY   . Prostate cancer ([place]) 05/20/14   Gleason 4+3=7, volume 25 gm  . Radiation 2015   hx of, prostate cancer  . Seizures ([place])    hx of x yrs, "the bad kind;bite tongue; STATES LAST [place] 2013; WAS SEEING [name] - HE RETIRED AND PT LAST SAW DR. [name]  .  Stroke ([place]) [place] 2013   UNABLE TO SPEAK OR MOVE AND RT SIDE WEAKNESS AND LOSS OF SKIN SENSITIVITY TO HEAT AND COLD ON RT SIDE, DOUBLE VISION. BALANCE PROBLEMS---STATES STILL HAS DOUBLE VISION AND BALANCE PROBLEM AND RT SIDED LOSS OF SKIN SENSITIVITY     Past Surgical History:  Procedure Laterality Date  . hydrocelectomy  11/2000   left  . IR FLUORO GUIDE CV LINE RIGHT  08/31/2017  . IR US GUIDE VASC ACCESS RIGHT  08/31/2017  . JOINT REPLACEMENT     LEFT TOTAL KNEE ARTHROPLASTY  . NEPHRECTOMY  1990's   left  . PROSTATE BIOPSY  05/20/14   Gleason 4+3=7, vol 25 gm  . TEE WITHOUT CARDIOVERSION  10/04/2011   Procedure: TRANSESOPHAGEAL ECHOCARDIOGRAM (TEE);  Surgeon: Candee Furbish, MD;  [facility] ENDOSCOPY;  Service: Cardiovascular;  Laterality: N/A;  .  TOTAL KNEE ARTHROPLASTY  2011   left  . TOTAL KNEE ARTHROPLASTY Right 02/24/2014   Procedure: RIGHT TOTAL KNEE ARTHROPLASTY;  Surgeon: Gearlean Alf, MD;  Location: [facility] ORS;  Service: Orthopedics;  Laterality: Right;   Review of Systems  Constitutional: Negative for chills, fatigue and fever.  HENT: Negative for mouth sores, sore throat, trouble swallowing and voice change.   Respiratory: Negative for cough, chest tightness, shortness of breath and wheezing.   Cardiovascular: Negative for chest pain.      Objective:    There were no vitals taken for this visit. Nursing note and vital signs reviewed.  Physical Exam    Connor [name] is pleasant to speak with and sounds to be doing very well without problems.  Assessment & Plan:   Problem List Items Addressed This Visit      Musculoskeletal and Integument   Chronic osteomyelitis of mandible - Primary    Connor [name] is doing well without symptoms and continues to remain off antibiotics. Previous inflammatory markers within normal range. No further treatment is necessary at this time and may follow up with ID office as needed.           I am having Connor [name] maintain his simvastatin, aspirin, valsartan, hydrochlorothiazide, amLODipine, chlorhexidine, bicalutamide, UNKNOWN TO PATIENT, lamoTRIgine, Aspirin-Calcium Carbonate, NONFORMULARY OR COMPOUNDED ITEM, fluconazole, and levofloxacin.   No orders of the defined types were placed in this encounter.   I discussed the assessment and treatment plan with the patient. The patient was provided an opportunity to ask questions and all were answered. The patient agreed with the plan and demonstrated an understanding of the instructions.   The patient was advised to call back or seek an in-person evaluation if the symptoms worsen or if the condition fails to improve as anticipated.   I provided  10  minutes of non-face-to-face time during this encounter.  Follow-up: Return if  symptoms worsen or fail to improve.   Terri Piedra, MSN, FNP-C Nurse Practitioner [facility] [place] number: [phone]

## 2019-02-05 NOTE — Patient Instructions (Signed)
Nice to speak with you.  I am glad to hear that you continue to do well.  No further treatment or additional follow up necessary.  Please let us know if your symptoms return.  Have a great day!

## 2019-02-05 NOTE — Progress Notes (Signed)
Patient agrees to set up MyChart, LPN sent MyChart signup via text

## 2019-02-05 NOTE — Assessment & Plan Note (Signed)
Connor [name] is doing well without symptoms and continues to remain off antibiotics. Previous inflammatory markers within normal range. No further treatment is necessary at this time and may follow up with ID office as needed.

## 2019-02-25 ENCOUNTER — Telehealth: Payer: Self-pay | Admitting: *Deleted

## 2019-02-25 ENCOUNTER — Encounter: Payer: Self-pay | Admitting: *Deleted

## 2019-02-25 NOTE — Telephone Encounter (Signed)
Called patient and advised him due to current COVID 19 pandemic, our office is severely reducing in person visits in order to minimize the risk to our patients and healthcare providers. We recommend to convert your appointment to a video visit. He stated he was unable to do video visit, and consented to a tele visit. We rescheduled him for tomorrow, updated EMR. He verbalized understanding, appreciation.

## 2019-02-26 ENCOUNTER — Other Ambulatory Visit: Payer: Self-pay

## 2019-02-26 ENCOUNTER — Ambulatory Visit (INDEPENDENT_AMBULATORY_CARE_PROVIDER_SITE_OTHER): Payer: PPO | Admitting: Diagnostic Neuroimaging

## 2019-02-26 DIAGNOSIS — G40909 Epilepsy, unspecified, not intractable, without status epilepticus: Secondary | ICD-10-CM

## 2019-02-26 DIAGNOSIS — G252 Other specified forms of tremor: Secondary | ICD-10-CM

## 2019-02-26 DIAGNOSIS — I63113 Cerebral infarction due to embolism of bilateral vertebral arteries: Secondary | ICD-10-CM

## 2019-02-26 MED ORDER — LAMOTRIGINE 100 MG PO TABS: ORAL_TABLET | ORAL | 4 refills | Status: DC

## 2019-02-26 NOTE — Progress Notes (Signed)
    Virtual Visit via Video Note  I connected with Connor [name] on 02/26/19 at  3:30 PM EDT by a video enabled telemedicine application and verified that I am speaking with the correct person using two identifiers.   I discussed the limitations of evaluation and management by telemedicine and the availability of in person appointments. The patient expressed understanding and agreed to proceed.  Patient is at their home. I am at the office.    History of Present Illness:  - doing well; no new issues - no seizures - tolerating meds - tremor is stable     Observations/Objective:  - awake and alert   Assessment and Plan:  73 y.o. old male here with history of seizure since 1994. 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 Overall stable. Last seizure in 2012. 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   Dx:  1. Seizure disorder ([place])   2. 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 Postural tremor      PLAN:  SEIZURE DISORDER - continue lamotrigine 100mg  in AM and 200mg  in PM - annual labs (CBC, CMP) per PCP   Meds ordered this encounter  Medications  . lamoTRIgine (LAMICTAL) 100 MG tablet    Sig: 100mg  in AM and 200mg  in PM    Dispense:  270 tablet    Refill:  4    STROKE PREVENTION - continue aspirin, BP control and statin for stroke prevention  POSTURAL TREMOR + rare rest tremor + bradykinesia - monitor tremor symptoms; may try primidone or carb/levo in future   Follow Up Instructions:  - Return in about 1 year (around 02/26/2020).    I discussed the assessment and treatment plan with the patient. The patient was provided an opportunity to ask questions and all were answered. The patient agreed with the plan and demonstrated an understanding of the instructions.   The patient was advised to call back or seek an  in-person evaluation if the symptoms worsen or if the condition fails to improve as anticipated.  I provided 11 minutes of non-face-to-face time during this encounter.   Penni Bombard, MD 05/24/2750, 7:00 PM Certified in Neurology, Neurophysiology and Neuroimaging  [facility] [address] [place], [place] [address] [phone]

## 2019-02-27 ENCOUNTER — Other Ambulatory Visit: Payer: Self-pay | Admitting: Internal Medicine

## 2019-03-11 ENCOUNTER — Ambulatory Visit: Payer: PPO | Admitting: Diagnostic Neuroimaging

## 2019-03-19 DIAGNOSIS — C61 Malignant neoplasm of prostate: Secondary | ICD-10-CM | POA: Diagnosis not present

## 2019-03-20 DIAGNOSIS — C61 Malignant neoplasm of prostate: Secondary | ICD-10-CM | POA: Diagnosis not present

## 2019-03-20 DIAGNOSIS — C775 Secondary and unspecified malignant neoplasm of intrapelvic lymph nodes: Secondary | ICD-10-CM | POA: Diagnosis not present

## 2019-04-03 DIAGNOSIS — I639 Cerebral infarction, unspecified: Secondary | ICD-10-CM | POA: Diagnosis not present

## 2019-04-03 DIAGNOSIS — K219 Gastro-esophageal reflux disease without esophagitis: Secondary | ICD-10-CM | POA: Diagnosis not present

## 2019-04-03 DIAGNOSIS — R7309 Other abnormal glucose: Secondary | ICD-10-CM | POA: Diagnosis not present

## 2019-04-03 DIAGNOSIS — E782 Mixed hyperlipidemia: Secondary | ICD-10-CM | POA: Diagnosis not present

## 2019-04-03 DIAGNOSIS — Z79899 Other long term (current) drug therapy: Secondary | ICD-10-CM | POA: Diagnosis not present

## 2019-04-03 DIAGNOSIS — G8194 Hemiplegia, unspecified affecting left nondominant side: Secondary | ICD-10-CM | POA: Diagnosis not present

## 2019-04-03 DIAGNOSIS — Z0001 Encounter for general adult medical examination with abnormal findings: Secondary | ICD-10-CM | POA: Diagnosis not present

## 2019-04-03 DIAGNOSIS — Z1389 Encounter for screening for other disorder: Secondary | ICD-10-CM | POA: Diagnosis not present

## 2019-04-03 DIAGNOSIS — E559 Vitamin D deficiency, unspecified: Secondary | ICD-10-CM | POA: Diagnosis not present

## 2019-04-03 DIAGNOSIS — R1310 Dysphagia, unspecified: Secondary | ICD-10-CM | POA: Diagnosis not present

## 2019-04-03 DIAGNOSIS — C61 Malignant neoplasm of prostate: Secondary | ICD-10-CM | POA: Diagnosis not present

## 2019-04-03 DIAGNOSIS — I10 Essential (primary) hypertension: Secondary | ICD-10-CM | POA: Diagnosis not present

## 2019-05-01 DIAGNOSIS — K219 Gastro-esophageal reflux disease without esophagitis: Secondary | ICD-10-CM | POA: Diagnosis not present

## 2019-05-01 DIAGNOSIS — Z8589 Personal history of malignant neoplasm of other organs and systems: Secondary | ICD-10-CM | POA: Diagnosis not present

## 2019-05-01 DIAGNOSIS — R1314 Dysphagia, pharyngoesophageal phase: Secondary | ICD-10-CM | POA: Diagnosis not present

## 2019-05-01 DIAGNOSIS — M272 Inflammatory conditions of jaws: Secondary | ICD-10-CM | POA: Diagnosis not present

## 2019-06-12 ENCOUNTER — Telehealth: Payer: Self-pay | Admitting: *Deleted

## 2019-06-12 NOTE — Telephone Encounter (Signed)
Called patient back to advise him that until his visit we can not give medication as we do not know what we are treating until we have labs or cultures. Patient did not answer him phone will have to wait until he returns the call.

## 2019-06-12 NOTE — Telephone Encounter (Signed)
-----   Message from [facility] sent at 06/11/2019  5:28 PM EDT ----- Patient called to set up an appointment for Dr. Baxter Flattery, but will need a refill on medication before appointment . If someone could give him a call at [phone].

## 2019-06-17 ENCOUNTER — Other Ambulatory Visit: Payer: Self-pay | Admitting: Internal Medicine

## 2019-06-17 DIAGNOSIS — M272 Inflammatory conditions of jaws: Secondary | ICD-10-CM

## 2019-06-19 DIAGNOSIS — C7951 Secondary malignant neoplasm of bone: Secondary | ICD-10-CM | POA: Diagnosis not present

## 2019-06-19 DIAGNOSIS — C61 Malignant neoplasm of prostate: Secondary | ICD-10-CM | POA: Diagnosis not present

## 2019-06-20 DIAGNOSIS — C775 Secondary and unspecified malignant neoplasm of intrapelvic lymph nodes: Secondary | ICD-10-CM | POA: Diagnosis not present

## 2019-06-20 DIAGNOSIS — C61 Malignant neoplasm of prostate: Secondary | ICD-10-CM | POA: Diagnosis not present

## 2019-06-24 ENCOUNTER — Other Ambulatory Visit: Payer: Self-pay

## 2019-06-24 ENCOUNTER — Encounter: Payer: Self-pay | Admitting: Internal Medicine

## 2019-06-24 ENCOUNTER — Ambulatory Visit (INDEPENDENT_AMBULATORY_CARE_PROVIDER_SITE_OTHER): Payer: PPO | Admitting: Internal Medicine

## 2019-06-24 VITALS — BP 177/84 | HR 74 | Temp 98.2°F

## 2019-06-24 DIAGNOSIS — M272 Inflammatory conditions of jaws: Secondary | ICD-10-CM | POA: Diagnosis not present

## 2019-06-24 MED ORDER — LEVOFLOXACIN 500 MG PO TABS: 500.0000 mg | ORAL_TABLET | Freq: Every day | ORAL | 2 refills | Status: DC

## 2019-06-24 NOTE — Progress Notes (Signed)
RFV: follow up for jaw osteo  Patient ID: Connor [name], male   DOB: Aug 27, 1946, 73 y.o.   MRN: [id number]  HPI  Connor [name] is a 73yo M with chronic osteo of jaw had been off of abtx for several months. Last month had teeth cleaned.. in the last week started to have left sided swelling of jaw, took a few left over abtx that had improved some of his sx.  Outpatient Encounter Medications as of 06/24/2019  Medication Sig  . amLODipine (NORVASC) 10 MG tablet   . aspirin 81 MG tablet Take 81 mg by mouth daily.  . Aspirin-Calcium Carbonate 81-777 MG TABS Take by mouth.  . bicalutamide (CASODEX) 50 MG tablet TK 1 T PO D  . chlorhexidine (PERIDEX) 0.12 % solution SWISH 15 ML PO FOR 30 SECONDS THEN SPIT BID  . hydrochlorothiazide (HYDRODIURIL) 12.5 MG tablet Take 1 tablet (12.5 mg total) by mouth daily.  Marland Kitchen lamoTRIgine (LAMICTAL) 100 MG tablet 100mg  in AM and 200mg  in PM  . NONFORMULARY OR COMPOUNDED ITEM Hormone injection from [facility] every 30 days.  . simvastatin (ZOCOR) 20 MG tablet Take 20 mg by mouth every evening.  Marland Kitchen UNKNOWN TO PATIENT Hormone injection from [facility] every 30 days.  . valsartan (DIOVAN) 160 MG tablet Take 1 tablet (160 mg total) by mouth daily.   No facility-administered encounter medications on file as of 06/24/2019.      Patient Active Problem List   Diagnosis Date Noted  . Chronic osteomyelitis of mandible 03/27/2018  . Pharyngeal dysphagia 03/27/2018  . Malignant neoplasm of prostate ([place]) 06/24/2014  . Elevated PSA 06/03/2014  . Hyponatremia 02/26/2014  . Postoperative anemia due to acute blood loss 02/26/2014  . OA (osteoarthritis) of knee 02/24/2014  . Dizziness 12/04/2011  . Hyperlipidemia LDL goal < 100 10/05/2011  . 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 CVA (cerebral infarction) 10/05/2011  . Stroke, acute, embolic ([place]) 0000000  . HTN (hypertension) 09/30/2011  . Seizure disorder ([place]) 09/30/2011     Health Maintenance Due  Topic Date Due  .  Hepatitis C Screening  1945/12/22  . TETANUS/TDAP  02/10/1967  . COLONOSCOPY  02/09/1998  . PNA vac Low Risk Adult (1 of 2 - PCV13) 02/09/2013  . INFLUENZA VACCINE  05/25/2019     Review of Systems 12 point ros is negative except for jaw pain Physical Exam   BP (!) 177/84   Pulse 74   Temp 98.2 F (36.8 C)    Physical Exam  Constitutional: He is oriented to person, place, and time. He appears well-developed and well-nourished. No distress.  HENT: Swelling of left side of face Mouth/Throat: Oropharynx is clear and moist. No oropharyngeal exudate.  Cardiovascular: Normal rate, regular rhythm and normal heart sounds. Exam reveals no gallop and no friction rub.  No murmur heard.  Pulmonary/Chest: Effort normal and breath sounds normal. No respiratory distress. He has no wheezes.  Lymphadenopathy:  He has no cervical adenopathy.  Neurological: He is alert and oriented to person, place, and time.  Skin: Skin is warm and dry. No rash noted. No erythema.  Psychiatric: He has a normal mood and affect. His behavior is normal.    CBC Lab Results  Component Value Date   WBC 10.0 01/09/2018   RBC 4.48 01/09/2018   HGB 12.5 (L) 01/09/2018   HCT 35.7 (L) 01/09/2018   PLT 301 01/09/2018   MCV 79.7 (L) 01/09/2018   MCH 27.9 01/09/2018   MCHC 35.0 01/09/2018  RDW 12.3 01/09/2018   LYMPHSABS 1,730 01/09/2018   MONOABS 1.3 (H) 10/06/2011   EOSABS 220 01/09/2018    BMET Lab Results  Component Value Date   NA 133 (L) 12/25/2017   K 4.9 12/25/2017   CL 94 (L) 12/25/2017   CO2 29 12/25/2017   GLUCOSE 123 (H) 12/25/2017   BUN 19 12/25/2017   CREATININE 1.14 12/25/2017   CALCIUM 10.1 12/25/2017   GFRNONAA 65 12/25/2017   GFRAA 76 12/25/2017      Assessment and Plan  recurence of left jaw osteo - will start back on abtx. Will see back in 4 wk to see if improved. Will check baseline inflam markers  Will restart levofloxacin and fluconazole

## 2019-06-25 LAB — CBC WITH DIFFERENTIAL/PLATELET
Absolute Monocytes: 1207 cells/uL — ABNORMAL HIGH (ref 200–950)
Basophils Absolute: 41 cells/uL (ref 0–200)
Basophils Relative: 0.5 %
Eosinophils Absolute: 162 cells/uL (ref 15–500)
Eosinophils Relative: 2 %
HCT: 40.5 % (ref 38.5–50.0)
Hemoglobin: 13.5 g/dL (ref 13.2–17.1)
Lymphs Abs: 1482 cells/uL (ref 850–3900)
MCH: 27.8 pg (ref 27.0–33.0)
MCHC: 33.3 g/dL (ref 32.0–36.0)
MCV: 83.5 fL (ref 80.0–100.0)
MPV: 9.6 fL (ref 7.5–12.5)
Monocytes Relative: 14.9 %
Neutro Abs: 5208 cells/uL (ref 1500–7800)
Neutrophils Relative %: 64.3 %
Platelets: 269 10*3/uL (ref 140–400)
RBC: 4.85 10*6/uL (ref 4.20–5.80)
RDW: 14 % (ref 11.0–15.0)
Total Lymphocyte: 18.3 %
WBC: 8.1 10*3/uL (ref 3.8–10.8)

## 2019-06-25 LAB — BASIC METABOLIC PANEL
BUN: 17 mg/dL (ref 7–25)
CO2: 31 mmol/L (ref 20–32)
Calcium: 9.4 mg/dL (ref 8.6–10.3)
Chloride: 96 mmol/L — ABNORMAL LOW (ref 98–110)
Creat: 1.18 mg/dL (ref 0.70–1.18)
Glucose, Bld: 98 mg/dL (ref 65–99)
Potassium: 5.3 mmol/L (ref 3.5–5.3)
Sodium: 131 mmol/L — ABNORMAL LOW (ref 135–146)

## 2019-06-25 LAB — SEDIMENTATION RATE: Sed Rate: 22 mm/h — ABNORMAL HIGH (ref 0–20)

## 2019-06-25 LAB — C-REACTIVE PROTEIN: CRP: 3.9 mg/L (ref <8.0)

## 2019-07-03 ENCOUNTER — Other Ambulatory Visit (HOSPITAL_COMMUNITY): Payer: Self-pay | Admitting: Oral and Maxillofacial Surgery

## 2019-07-03 ENCOUNTER — Other Ambulatory Visit: Payer: Self-pay | Admitting: Oral and Maxillofacial Surgery

## 2019-07-03 DIAGNOSIS — C7951 Secondary malignant neoplasm of bone: Secondary | ICD-10-CM

## 2019-07-05 ENCOUNTER — Other Ambulatory Visit: Payer: Self-pay

## 2019-07-05 ENCOUNTER — Ambulatory Visit (HOSPITAL_COMMUNITY)
Admission: RE | Admit: 2019-07-05 | Discharge: 2019-07-05 | Disposition: A | Discharge status: Home / Self Care | Payer: PPO | Source: Ambulatory Visit | Attending: Oral and Maxillofacial Surgery | Admitting: Oral and Maxillofacial Surgery

## 2019-07-05 DIAGNOSIS — C7952 Secondary malignant neoplasm of bone marrow: Secondary | ICD-10-CM | POA: Insufficient documentation

## 2019-07-05 DIAGNOSIS — C7951 Secondary malignant neoplasm of bone: Secondary | ICD-10-CM | POA: Diagnosis not present

## 2019-07-05 DIAGNOSIS — S02652A Fracture of angle of left mandible, initial encounter for closed fracture: Secondary | ICD-10-CM | POA: Diagnosis not present

## 2019-07-10 DIAGNOSIS — C7951 Secondary malignant neoplasm of bone: Secondary | ICD-10-CM | POA: Diagnosis not present

## 2019-07-10 DIAGNOSIS — C61 Malignant neoplasm of prostate: Secondary | ICD-10-CM | POA: Diagnosis not present

## 2019-07-11 ENCOUNTER — Other Ambulatory Visit: Payer: Self-pay | Admitting: Internal Medicine

## 2019-07-11 MED ORDER — AMOXICILLIN 500 MG PO CAPS: 500.0000 mg | ORAL_CAPSULE | Freq: Three times a day (TID) | ORAL | 5 refills | Status: DC

## 2019-07-11 NOTE — Progress Notes (Signed)
Notified patient that Dr Baxter Flattery sent in the amoxicillin prescription per his request to [facility] in [place]. Landis Gandy, RN

## 2019-07-22 ENCOUNTER — Other Ambulatory Visit: Payer: Self-pay

## 2019-07-22 ENCOUNTER — Ambulatory Visit (INDEPENDENT_AMBULATORY_CARE_PROVIDER_SITE_OTHER): Payer: PPO | Admitting: Internal Medicine

## 2019-07-22 ENCOUNTER — Encounter: Payer: Self-pay | Admitting: Internal Medicine

## 2019-07-22 VITALS — BP 136/79 | HR 84

## 2019-07-22 DIAGNOSIS — M272 Inflammatory conditions of jaws: Secondary | ICD-10-CM

## 2019-07-22 NOTE — Progress Notes (Signed)
RFV: follow up for jaw osteo  Patient ID: Connor [name], male   DOB: [birth year], 73 y.o.   MRN: [id number]  HPI  73 yo with history of metastatic prostate ca on casodex and lupron injection with metastatic lesions to spine,also has hx of primary SCC of tonsil s/p radiation and likely sustained damage to jaw/dentition with with chronic jaw osteomyelitis. He has also had multiple teeth extraction with bone exposure in the past, seen last month after recurrence of swelling of left jaw. He had seen his oral surgeon in early September who arranged for repeat imaging that shows still extensive changes of infection. He was restarted on oral abtx , currently on penicillin. He denies fever, chills, but still has cold sensitivity as well as Unable to chew left side of mouth without discomfort as one would expect  His oral surgeon is referring him to academic center for further evaluation/2nd opinion to whether he would need debridement which he is not completely enthusiastic about major surgery per his response.  Recent imaging shows: IMPRESSION: 1. Displaced and impacted fracture extending through the angle of the left mandible with associated erosive and osseous destructive changes and several foci of gas within the medullary bone of the mandible, concerning for superimposed osteomyelitis. 2. Extensive soft tissue swelling and stranding including a loculated collection surrounding the left mandibular fracture, consistent with cellulitis. There is associated thickening and stranding of the adjacent medial and lateral pterygoid musculature, edema of the infratemporal fat, and fluid tracking in the fascial planes surrounding the left masseter and temporalis muscles concerning for so seated myositis. Assessment for soft tissue abscess is limited in the absence of contrast. 3. Extensive periodontal disease with numerous absent teeth as well as heterogeneous debris within the sockets of the left  first and third maxillary molars. 4. Nodular mural sinus disease in the left maxillary sinus, suspect odontogenic origin  Lab Results  Component Value Date   ESRSEDRATE 22 (H) 06/24/2019   Lab Results  Component Value Date   CRP 3.9 06/24/2019     Outpatient Encounter Medications as of 07/22/2019  Medication Sig  . amLODipine (NORVASC) 10 MG tablet   . amoxicillin (AMOXIL) 500 MG capsule Take 1 capsule (500 mg total) by mouth 3 (three) times daily.  Marland Kitchen aspirin 81 MG tablet Take 81 mg by mouth daily.  . Aspirin-Calcium Carbonate 81-777 MG TABS Take by mouth.  . bicalutamide (CASODEX) 50 MG tablet TK 1 T PO D  . chlorhexidine (PERIDEX) 0.12 % solution SWISH 15 ML PO FOR 30 SECONDS THEN SPIT BID  . hydrochlorothiazide (HYDRODIURIL) 12.5 MG tablet Take 1 tablet (12.5 mg total) by mouth daily.  Marland Kitchen lamoTRIgine (LAMICTAL) 100 MG tablet 100mg  in AM and 200mg  in PM  . NONFORMULARY OR COMPOUNDED ITEM Hormone injection from [facility] every 30 days.  . simvastatin (ZOCOR) 20 MG tablet Take 20 mg by mouth every evening.  Marland Kitchen UNKNOWN TO PATIENT Hormone injection from [facility] every 30 days.  . valsartan (DIOVAN) 160 MG tablet Take 1 tablet (160 mg total) by mouth daily.   No facility-administered encounter medications on file as of 07/22/2019.      Patient Active Problem List   Diagnosis Date Noted  . Chronic osteomyelitis of mandible 03/27/2018  . Pharyngeal dysphagia 03/27/2018  . Malignant neoplasm of prostate ([place]) 06/24/2014  . Elevated PSA 06/03/2014  . Hyponatremia 02/26/2014  . Postoperative anemia due to acute blood loss 02/26/2014  . OA (osteoarthritis) of knee 02/24/2014  .  Dizziness 12/04/2011  . Hyperlipidemia LDL goal < 100 10/05/2011  . 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 CVA (cerebral infarction) 10/05/2011  . Stroke, acute, embolic ([place]) 0000000  . HTN (hypertension) 09/30/2011  . Seizure disorder ([place]) 09/30/2011     Health Maintenance Due  Topic Date Due  .  Hepatitis C Screening  12/07/1945  . TETANUS/TDAP  02/10/1967  . COLONOSCOPY  02/09/1998  . PNA vac Low Risk Adult (1 of 2 - PCV13) 02/09/2013  . INFLUENZA VACCINE  05/25/2019    Social History   Tobacco Use  . Smoking status: Never Smoker  . Smokeless tobacco: Never Used  Substance Use Topics  . Alcohol use: Yes    Alcohol/week: 0.0 standard drinks    Comment: WAS 6 TO 8 BEERS DAILY   . Drug use: No   Review of Systems Left facial swelling, decreased sensation to left neck- chronic.poor dentition 12 point ros is otherwise negative Physical Exam   BP 136/79   Pulse 84   Physical Exam  Constitutional: He is oriented to person, place, and time. He appears well-developed and well-nourished. No distress.  HENT:  Mouth/Throat: Oropharynx is clear and moist. No oropharyngeal exudate. Poor dentition, no exposed bone that I could see. Mild swelling/asymmetry to his face Neck: scarring,tightness of skin Skin: Skin is warm and dry. No rash noted. No erythema.  Psychiatric: He has a normal mood and affect. His behavior is normal.    CBC Lab Results  Component Value Date   WBC 8.1 06/24/2019   RBC 4.85 06/24/2019   HGB 13.5 06/24/2019   HCT 40.5 06/24/2019   PLT 269 06/24/2019   MCV 83.5 06/24/2019   MCH 27.8 06/24/2019   MCHC 33.3 06/24/2019   RDW 14.0 06/24/2019   LYMPHSABS 1,482 06/24/2019   MONOABS 1.3 (H) 10/06/2011   EOSABS 162 06/24/2019    BMET Lab Results  Component Value Date   NA 131 (L) 06/24/2019   K 5.3 06/24/2019   CL 96 (L) 06/24/2019   CO2 31 06/24/2019   GLUCOSE 98 06/24/2019   BUN 17 06/24/2019   CREATININE 1.18 06/24/2019   CALCIUM 9.4 06/24/2019   GFRNONAA 65 12/25/2017   GFRAA 76 12/25/2017      Assessment and Plan Chronic jaw osteo =Continue with penicillin 500 mg TID for now and will provide refills. await Upcoming evaluation at [facility]  To see if will get surgical debridement. Would like to get surgical sample to send for  aerobic/anaerobic/fungal culture Plan on lifelong suppression especially if defer surgical debridement.  Flu vaccine = don't have high dose flu vaccine in stock today, will vaccinate at next visit if he has not already done so

## 2019-07-25 DIAGNOSIS — M272 Inflammatory conditions of jaws: Secondary | ICD-10-CM | POA: Diagnosis not present

## 2019-07-25 DIAGNOSIS — M87180 Osteonecrosis due to drugs, jaw: Secondary | ICD-10-CM | POA: Diagnosis not present

## 2019-07-29 DIAGNOSIS — S02652A Fracture of angle of left mandible, initial encounter for closed fracture: Secondary | ICD-10-CM | POA: Diagnosis not present

## 2019-08-08 DIAGNOSIS — C61 Malignant neoplasm of prostate: Secondary | ICD-10-CM | POA: Diagnosis not present

## 2019-08-09 DIAGNOSIS — C7951 Secondary malignant neoplasm of bone: Secondary | ICD-10-CM | POA: Diagnosis not present

## 2019-08-09 DIAGNOSIS — C61 Malignant neoplasm of prostate: Secondary | ICD-10-CM | POA: Diagnosis not present

## 2019-08-09 DIAGNOSIS — C775 Secondary and unspecified malignant neoplasm of intrapelvic lymph nodes: Secondary | ICD-10-CM | POA: Diagnosis not present

## 2019-08-26 ENCOUNTER — Other Ambulatory Visit: Payer: Self-pay

## 2019-08-26 ENCOUNTER — Encounter: Payer: Self-pay | Admitting: Internal Medicine

## 2019-08-26 ENCOUNTER — Ambulatory Visit (INDEPENDENT_AMBULATORY_CARE_PROVIDER_SITE_OTHER): Payer: PPO | Admitting: Internal Medicine

## 2019-08-26 VITALS — Wt 196.4 lb

## 2019-08-26 DIAGNOSIS — Z23 Encounter for immunization: Secondary | ICD-10-CM

## 2019-08-26 DIAGNOSIS — M272 Inflammatory conditions of jaws: Secondary | ICD-10-CM | POA: Diagnosis not present

## 2019-08-26 NOTE — Progress Notes (Signed)
Patient ID: Connor [name], male   DOB: 01-04-1946, 73 y.o.   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   Had repeat imaging at [facility] which shows recent imaging from a month ago. Now improved.   Continues with irrigation and rinse by dr Buelah Manis Outpatient Encounter Medications as of 08/26/2019  Medication Sig  . amLODipine (NORVASC) 10 MG tablet   . amLODipine-valsartan (EXFORGE) 5-160 MG tablet TK 1 T PO QD  . amoxicillin (AMOXIL) 500 MG capsule Take 1 capsule (500 mg total) by mouth 3 (three) times daily. (Patient not taking: Reported on 08/26/2019)  . aspirin 81 MG tablet Take 81 mg by mouth daily.  . Aspirin-Calcium Carbonate 81-777 MG TABS Take by mouth.  . bicalutamide (CASODEX) 50 MG tablet TK 1 T PO D  . chlorhexidine (PERIDEX) 0.12 % solution SWISH 15 ML PO FOR 30 SECONDS THEN SPIT BID  . hydrochlorothiazide (HYDRODIURIL) 12.5 MG tablet Take 1 tablet (12.5 mg total) by mouth daily.  Marland Kitchen lamoTRIgine (LAMICTAL) 100 MG tablet 100mg  in AM and 200mg  in PM  . NONFORMULARY OR COMPOUNDED ITEM Hormone injection from Cancer MD every 30 days.  Marland Kitchen omeprazole (PRILOSEC) 40 MG capsule   . penicillin v potassium (VEETID) 500 MG tablet Take 500 mg by mouth 4 (four) times daily.  . simvastatin (ZOCOR) 20 MG tablet Take 20 mg by mouth every evening.  Marland Kitchen UNKNOWN TO PATIENT Hormone injection from Cancer MD every 30 days.  . valsartan (DIOVAN) 160 MG tablet Take 1 tablet (160 mg total) by mouth daily.   No facility-administered encounter medications on file as of 08/26/2019.      Patient Active Problem List   Diagnosis Date Noted  . Chronic osteomyelitis of mandible 03/27/2018  . Pharyngeal dysphagia 03/27/2018  . Malignant  neoplasm of prostate ([place]) 06/24/2014  . Elevated PSA 06/03/2014  . Hyponatremia 02/26/2014  . Postoperative anemia due to acute blood loss 02/26/2014  . OA (osteoarthritis) of knee 02/24/2014  . Dizziness 12/04/2011  . Hyperlipidemia LDL goal < 100 10/05/2011  . 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 CVA (cerebral infarction) 10/05/2011  . 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 HTN (hypertension) 09/30/2011  . Seizure disorder ([place]) 09/30/2011     Health Maintenance Due  Topic Date Due  . Hepatitis C Screening  June 08, 1946  . TETANUS/TDAP  02/10/1967  . COLONOSCOPY  02/09/1998  . PNA vac Low Risk Adult (1 of 2 - PCV13) 02/09/2013  . INFLUENZA VACCINE  05/25/2019     Review of Systems  Constitutional: Negative for fever, chills, diaphoresis, activity change, appetite change, fatigue and unexpected weight change.  HENT: Negative for congestion, sore throat, rhinorrhea, sneezing, trouble swallowing and sinus pressure.  Eyes: Negative for photophobia and visual disturbance.  Respiratory: Negative for cough, chest tightness, shortness of breath, wheezing and stridor.  Cardiovascular: Negative for chest pain, palpitations and leg swelling.  Gastrointestinal: Negative for nausea, vomiting, abdominal pain, diarrhea, constipation, blood in stool, abdominal distention and anal bleeding.  Genitourinary: Negative for dysuria, hematuria, flank pain and difficulty urinating.  Musculoskeletal: Negative for myalgias, back pain, joint swelling, arthralgias and gait problem.  Skin: Negative for color change, pallor, rash and wound.  Neurological: Negative for dizziness,  tremors, weakness and light-headedness.  Hematological: Negative for adenopathy. Does not bruise/bleed easily.  Psychiatric/Behavioral: Negative for behavioral problems, confusion, sleep disturbance, dysphoric mood, decreased concentration and agitation.    Physical Exam   Wt 196 lb 6.4 oz (89.1 kg)   BMI 29.00 kg/m   gen = a  x o by 4 in nad HEENT = back to baseline in terms of swelling to left cheek. No erythema. Poor dentition CBC Lab Results  Component Value Date   WBC 8.1 06/24/2019   RBC 4.85 06/24/2019   HGB 13.5 06/24/2019   HCT 40.5 06/24/2019   PLT 269 06/24/2019   MCV 83.5 06/24/2019   MCH 27.8 06/24/2019   MCHC 33.3 06/24/2019   RDW 14.0 06/24/2019   LYMPHSABS 1,482 06/24/2019   MONOABS 1.3 (H) 10/06/2011   EOSABS 162 06/24/2019    BMET Lab Results  Component Value Date   NA 131 (L) 06/24/2019   K 5.3 06/24/2019   CL 96 (L) 06/24/2019   CO2 31 06/24/2019   GLUCOSE 98 06/24/2019   BUN 17 06/24/2019   CREATININE 1.18 06/24/2019   CALCIUM 9.4 06/24/2019   GFRNONAA 65 12/25/2017   GFRAA 76 12/25/2017      Assessment and Plan Jaw osteomyelitis = had prolonged treatment but did well off of abtx up until late august where he also sustained spontaneous fracture of .  Okay to watch off of abtx.  Phone visit 4 in weeks to see   Health maintenance= High dose flu shot

## 2019-09-04 DIAGNOSIS — M272 Inflammatory conditions of jaws: Secondary | ICD-10-CM | POA: Diagnosis not present

## 2019-09-04 DIAGNOSIS — C61 Malignant neoplasm of prostate: Secondary | ICD-10-CM | POA: Diagnosis not present

## 2019-09-04 DIAGNOSIS — G8194 Hemiplegia, unspecified affecting left nondominant side: Secondary | ICD-10-CM | POA: Diagnosis not present

## 2019-09-04 DIAGNOSIS — Z8589 Personal history of malignant neoplasm of other organs and systems: Secondary | ICD-10-CM | POA: Diagnosis not present

## 2019-10-08 DIAGNOSIS — C61 Malignant neoplasm of prostate: Secondary | ICD-10-CM | POA: Diagnosis not present

## 2019-10-09 ENCOUNTER — Ambulatory Visit: Payer: PPO | Admitting: Internal Medicine

## 2019-10-09 DIAGNOSIS — C61 Malignant neoplasm of prostate: Secondary | ICD-10-CM | POA: Diagnosis not present

## 2019-10-09 DIAGNOSIS — C775 Secondary and unspecified malignant neoplasm of intrapelvic lymph nodes: Secondary | ICD-10-CM | POA: Diagnosis not present

## 2019-10-09 DIAGNOSIS — C7951 Secondary malignant neoplasm of bone: Secondary | ICD-10-CM | POA: Diagnosis not present

## 2019-10-23 ENCOUNTER — Telehealth: Payer: Self-pay | Admitting: *Deleted

## 2019-10-23 DIAGNOSIS — M272 Inflammatory conditions of jaws: Secondary | ICD-10-CM

## 2019-10-23 NOTE — Telephone Encounter (Signed)
Patient's jaw infection returned last week. He has a supply of antibiotics on hand. He started with Amoxicillin 500 mg three times daily, but did not have much relief and switched to penicillin 500 mg four times daily. He had some improvement, but decided to try the levaquin he had at home. He felt the best on the levaquin, would like refills of that and fluconazole to be sent to [facility] in [place]. This is not on his current med list. Please advise. Patient has preop consultation at [facility] on 10/30/19. Landis Gandy, RN

## 2019-10-24 NOTE — Telephone Encounter (Signed)
Can refill the fluconazole. Ideally needs to have his dental team sample and culture. Ask him to stop abtx roughly 48hrs prior to his appt to see if they would sample/culture

## 2019-10-28 NOTE — Telephone Encounter (Signed)
Spoke with patient. Preop is 1/6. He will stop antibiotics 2 days before surgery once it is scheduled. He will make sure his dentist Dr. Patrina Levering is aware he is off antibiotics for any cultures to be done. OK to send in levofloxacin (500 mg daily) refill with fluconazole (400 mg daily)? Connor Gandy, RN

## 2019-10-30 DIAGNOSIS — Y842 Radiological procedure and radiotherapy as the cause of abnormal reaction of the patient, or of later complication, without mention of misadventure at the time of the procedure: Secondary | ICD-10-CM | POA: Diagnosis not present

## 2019-10-30 DIAGNOSIS — M272 Inflammatory conditions of jaws: Secondary | ICD-10-CM | POA: Diagnosis not present

## 2019-10-31 MED ORDER — FLUCONAZOLE 200 MG PO TABS: 400.0000 mg | ORAL_TABLET | Freq: Every day | ORAL | 2 refills | Status: DC

## 2019-10-31 MED ORDER — LEVOFLOXACIN 500 MG PO TABS: 500.0000 mg | ORAL_TABLET | Freq: Every day | ORAL | 2 refills | Status: DC

## 2019-10-31 NOTE — Telephone Encounter (Signed)
Following up.  Per patient, Dr Patrina Levering wants ~30 hyperbaric treatments plus antibiotics (they added metronidazole 500 mg three times a day to the current levofloxacin and fluconazole).  Hyperbaric may potentially start next week. Per Dr Baxter Flattery, sent in fluconazole 400mg  daily, levofloxacin 500 mg daily for #60 days. Landis Gandy, RN

## 2019-10-31 NOTE — Addendum Note (Signed)
Addended by: Landis Gandy on: 10/31/2019 04:04 PM   Modules accepted: Orders

## 2019-11-06 DIAGNOSIS — T280XXA Burn of mouth and pharynx, initial encounter: Secondary | ICD-10-CM | POA: Diagnosis not present

## 2019-11-06 DIAGNOSIS — L598 Other specified disorders of the skin and subcutaneous tissue related to radiation: Secondary | ICD-10-CM | POA: Diagnosis not present

## 2019-11-06 DIAGNOSIS — M272 Inflammatory conditions of jaws: Secondary | ICD-10-CM | POA: Diagnosis not present

## 2019-11-06 DIAGNOSIS — S01502A Unspecified open wound of oral cavity, initial encounter: Secondary | ICD-10-CM | POA: Diagnosis not present

## 2019-12-09 DIAGNOSIS — C61 Malignant neoplasm of prostate: Secondary | ICD-10-CM | POA: Diagnosis not present

## 2019-12-10 DIAGNOSIS — C61 Malignant neoplasm of prostate: Secondary | ICD-10-CM | POA: Diagnosis not present

## 2019-12-10 DIAGNOSIS — C7951 Secondary malignant neoplasm of bone: Secondary | ICD-10-CM | POA: Diagnosis not present

## 2020-01-06 DIAGNOSIS — C61 Malignant neoplasm of prostate: Secondary | ICD-10-CM | POA: Diagnosis not present

## 2020-01-07 DIAGNOSIS — C61 Malignant neoplasm of prostate: Secondary | ICD-10-CM | POA: Diagnosis not present

## 2020-01-07 DIAGNOSIS — C775 Secondary and unspecified malignant neoplasm of intrapelvic lymph nodes: Secondary | ICD-10-CM | POA: Diagnosis not present

## 2020-01-09 ENCOUNTER — Other Ambulatory Visit (HOSPITAL_COMMUNITY): Payer: Self-pay | Admitting: Oral Surgery

## 2020-01-09 ENCOUNTER — Other Ambulatory Visit: Payer: Self-pay | Admitting: Oral Surgery

## 2020-01-09 DIAGNOSIS — M272 Inflammatory conditions of jaws: Secondary | ICD-10-CM | POA: Diagnosis not present

## 2020-01-09 DIAGNOSIS — M8730 Other secondary osteonecrosis, unspecified bone: Secondary | ICD-10-CM

## 2020-01-09 DIAGNOSIS — Y842 Radiological procedure and radiotherapy as the cause of abnormal reaction of the patient, or of later complication, without mention of misadventure at the time of the procedure: Secondary | ICD-10-CM

## 2020-01-09 DIAGNOSIS — M87180 Osteonecrosis due to drugs, jaw: Secondary | ICD-10-CM | POA: Diagnosis not present

## 2020-01-10 ENCOUNTER — Other Ambulatory Visit (HOSPITAL_COMMUNITY): Payer: Self-pay | Admitting: Oral Surgery

## 2020-01-10 DIAGNOSIS — M272 Inflammatory conditions of jaws: Secondary | ICD-10-CM

## 2020-01-10 DIAGNOSIS — M8730 Other secondary osteonecrosis, unspecified bone: Secondary | ICD-10-CM

## 2020-01-21 ENCOUNTER — Other Ambulatory Visit: Payer: Self-pay

## 2020-01-21 ENCOUNTER — Ambulatory Visit (HOSPITAL_COMMUNITY)
Admission: RE | Admit: 2020-01-21 | Discharge: 2020-01-21 | Disposition: A | Discharge status: Home / Self Care | Payer: PPO | Source: Ambulatory Visit | Attending: Oral Surgery | Admitting: Oral Surgery

## 2020-01-21 DIAGNOSIS — M8668 Other chronic osteomyelitis, other site: Secondary | ICD-10-CM | POA: Diagnosis not present

## 2020-01-21 DIAGNOSIS — M8730 Other secondary osteonecrosis, unspecified bone: Secondary | ICD-10-CM | POA: Insufficient documentation

## 2020-01-21 DIAGNOSIS — Y842 Radiological procedure and radiotherapy as the cause of abnormal reaction of the patient, or of later complication, without mention of misadventure at the time of the procedure: Secondary | ICD-10-CM | POA: Insufficient documentation

## 2020-01-21 DIAGNOSIS — M272 Inflammatory conditions of jaws: Secondary | ICD-10-CM | POA: Diagnosis not present

## 2020-01-21 LAB — POCT I-STAT CREATININE: Creatinine, Ser: 1.2 mg/dL (ref 0.61–1.24)

## 2020-01-21 MED ORDER — IOHEXOL 300 MG/ML  SOLN
75.0000 mL | Freq: Once | INTRAMUSCULAR | Status: AC | PRN
  Administered 2020-01-21: 75 mL via INTRAVENOUS

## 2020-01-22 DIAGNOSIS — Z85818 Personal history of malignant neoplasm of other sites of lip, oral cavity, and pharynx: Secondary | ICD-10-CM | POA: Diagnosis not present

## 2020-01-22 DIAGNOSIS — M8448XA Pathological fracture, other site, initial encounter for fracture: Secondary | ICD-10-CM | POA: Diagnosis not present

## 2020-01-31 DIAGNOSIS — Z01818 Encounter for other preprocedural examination: Secondary | ICD-10-CM | POA: Diagnosis not present

## 2020-01-31 DIAGNOSIS — Z01812 Encounter for preprocedural laboratory examination: Secondary | ICD-10-CM | POA: Diagnosis not present

## 2020-01-31 DIAGNOSIS — M8448XA Pathological fracture, other site, initial encounter for fracture: Secondary | ICD-10-CM | POA: Diagnosis not present

## 2020-01-31 DIAGNOSIS — Z20822 Contact with and (suspected) exposure to covid-19: Secondary | ICD-10-CM | POA: Diagnosis not present

## 2020-02-04 DIAGNOSIS — Y842 Radiological procedure and radiotherapy as the cause of abnormal reaction of the patient, or of later complication, without mention of misadventure at the time of the procedure: Secondary | ICD-10-CM | POA: Diagnosis not present

## 2020-02-04 DIAGNOSIS — K029 Dental caries, unspecified: Secondary | ICD-10-CM | POA: Diagnosis not present

## 2020-02-04 DIAGNOSIS — I69328 Other speech and language deficits following cerebral infarction: Secondary | ICD-10-CM | POA: Diagnosis not present

## 2020-02-04 DIAGNOSIS — K219 Gastro-esophageal reflux disease without esophagitis: Secondary | ICD-10-CM | POA: Diagnosis not present

## 2020-02-04 DIAGNOSIS — I69392 Facial weakness following cerebral infarction: Secondary | ICD-10-CM | POA: Diagnosis not present

## 2020-02-04 DIAGNOSIS — M272 Inflammatory conditions of jaws: Secondary | ICD-10-CM | POA: Diagnosis not present

## 2020-02-04 DIAGNOSIS — M8448XA Pathological fracture, other site, initial encounter for fracture: Secondary | ICD-10-CM | POA: Diagnosis not present

## 2020-02-04 DIAGNOSIS — H9190 Unspecified hearing loss, unspecified ear: Secondary | ICD-10-CM | POA: Diagnosis not present

## 2020-02-04 DIAGNOSIS — C61 Malignant neoplasm of prostate: Secondary | ICD-10-CM | POA: Diagnosis not present

## 2020-02-04 DIAGNOSIS — C099 Malignant neoplasm of tonsil, unspecified: Secondary | ICD-10-CM | POA: Diagnosis not present

## 2020-02-04 DIAGNOSIS — C7951 Secondary malignant neoplasm of bone: Secondary | ICD-10-CM | POA: Diagnosis not present

## 2020-02-04 DIAGNOSIS — Z96653 Presence of artificial knee joint, bilateral: Secondary | ICD-10-CM | POA: Diagnosis not present

## 2020-02-04 DIAGNOSIS — M87180 Osteonecrosis due to drugs, jaw: Secondary | ICD-10-CM | POA: Diagnosis not present

## 2020-02-04 DIAGNOSIS — Z905 Acquired absence of kidney: Secondary | ICD-10-CM | POA: Diagnosis not present

## 2020-02-04 DIAGNOSIS — R569 Unspecified convulsions: Secondary | ICD-10-CM | POA: Diagnosis not present

## 2020-02-04 DIAGNOSIS — E785 Hyperlipidemia, unspecified: Secondary | ICD-10-CM | POA: Diagnosis not present

## 2020-02-04 DIAGNOSIS — Z79899 Other long term (current) drug therapy: Secondary | ICD-10-CM | POA: Diagnosis not present

## 2020-02-04 DIAGNOSIS — Z923 Personal history of irradiation: Secondary | ICD-10-CM | POA: Diagnosis not present

## 2020-02-04 DIAGNOSIS — Z792 Long term (current) use of antibiotics: Secondary | ICD-10-CM | POA: Diagnosis not present

## 2020-02-04 DIAGNOSIS — R519 Headache, unspecified: Secondary | ICD-10-CM | POA: Diagnosis not present

## 2020-02-04 DIAGNOSIS — Z885 Allergy status to narcotic agent status: Secondary | ICD-10-CM | POA: Diagnosis not present

## 2020-02-04 DIAGNOSIS — I10 Essential (primary) hypertension: Secondary | ICD-10-CM | POA: Diagnosis not present

## 2020-02-04 DIAGNOSIS — M8458XA Pathological fracture in neoplastic disease, other specified site, initial encounter for fracture: Secondary | ICD-10-CM | POA: Diagnosis not present

## 2020-02-04 DIAGNOSIS — Z9221 Personal history of antineoplastic chemotherapy: Secondary | ICD-10-CM | POA: Diagnosis not present

## 2020-02-05 DIAGNOSIS — Z09 Encounter for follow-up examination after completed treatment for conditions other than malignant neoplasm: Secondary | ICD-10-CM | POA: Diagnosis not present

## 2020-02-20 DIAGNOSIS — C61 Malignant neoplasm of prostate: Secondary | ICD-10-CM | POA: Diagnosis not present

## 2020-02-21 DIAGNOSIS — C7951 Secondary malignant neoplasm of bone: Secondary | ICD-10-CM | POA: Diagnosis not present

## 2020-02-21 DIAGNOSIS — C773 Secondary and unspecified malignant neoplasm of axilla and upper limb lymph nodes: Secondary | ICD-10-CM | POA: Diagnosis not present

## 2020-02-21 DIAGNOSIS — C61 Malignant neoplasm of prostate: Secondary | ICD-10-CM | POA: Diagnosis not present

## 2020-02-28 ENCOUNTER — Emergency Department (HOSPITAL_COMMUNITY)
Admission: EM | Admit: 2020-02-28 | Discharge: 2020-02-29 | Disposition: A | Discharge status: Other Acute Inpatient Hospital | Payer: PPO | Attending: Emergency Medicine | Admitting: Emergency Medicine

## 2020-02-28 ENCOUNTER — Encounter (HOSPITAL_COMMUNITY): Payer: Self-pay | Admitting: Emergency Medicine

## 2020-02-28 DIAGNOSIS — Z85528 Personal history of other malignant neoplasm of kidney: Secondary | ICD-10-CM | POA: Diagnosis not present

## 2020-02-28 DIAGNOSIS — Z79899 Other long term (current) drug therapy: Secondary | ICD-10-CM | POA: Insufficient documentation

## 2020-02-28 DIAGNOSIS — I10 Essential (primary) hypertension: Secondary | ICD-10-CM | POA: Insufficient documentation

## 2020-02-28 DIAGNOSIS — R710 Precipitous drop in hematocrit: Secondary | ICD-10-CM | POA: Diagnosis not present

## 2020-02-28 DIAGNOSIS — Z20822 Contact with and (suspected) exposure to covid-19: Secondary | ICD-10-CM | POA: Diagnosis not present

## 2020-02-28 DIAGNOSIS — K1379 Other lesions of oral mucosa: Secondary | ICD-10-CM | POA: Diagnosis present

## 2020-02-28 DIAGNOSIS — Z7982 Long term (current) use of aspirin: Secondary | ICD-10-CM | POA: Insufficient documentation

## 2020-02-28 DIAGNOSIS — Z03818 Encounter for observation for suspected exposure to other biological agents ruled out: Secondary | ICD-10-CM | POA: Diagnosis not present

## 2020-02-28 DIAGNOSIS — R041 Hemorrhage from throat: Secondary | ICD-10-CM | POA: Diagnosis not present

## 2020-02-28 DIAGNOSIS — Z9889 Other specified postprocedural states: Secondary | ICD-10-CM | POA: Insufficient documentation

## 2020-02-28 DIAGNOSIS — Z8546 Personal history of malignant neoplasm of prostate: Secondary | ICD-10-CM | POA: Diagnosis not present

## 2020-02-28 LAB — PROTIME-INR
INR: 1.1 (ref 0.8–1.2)
Prothrombin Time: 13.5 seconds (ref 11.4–15.2)

## 2020-02-28 LAB — BASIC METABOLIC PANEL
Anion gap: 12 (ref 5–15)
BUN: 19 mg/dL (ref 8–23)
CO2: 25 mmol/L (ref 22–32)
Calcium: 8.9 mg/dL (ref 8.9–10.3)
Chloride: 89 mmol/L — ABNORMAL LOW (ref 98–111)
Creatinine, Ser: 1.01 mg/dL (ref 0.61–1.24)
GFR calc Af Amer: >60 mL/min (ref >60)
GFR calc non Af Amer: >60 mL/min (ref >60)
Glucose, Bld: 117 mg/dL — ABNORMAL HIGH (ref 70–99)
Potassium: 4.4 mmol/L (ref 3.5–5.1)
Sodium: 126 mmol/L — ABNORMAL LOW (ref 135–145)

## 2020-02-28 LAB — CBC WITH DIFFERENTIAL/PLATELET
Abs Immature Granulocytes: 0.06 10*3/uL (ref 0.00–0.07)
Basophils Absolute: 0 10*3/uL (ref 0.0–0.1)
Basophils Relative: 0 %
Eosinophils Absolute: 0.2 10*3/uL (ref 0.0–0.5)
Eosinophils Relative: 2 %
HCT: 28.9 % — ABNORMAL LOW (ref 39.0–52.0)
Hemoglobin: 9.8 g/dL — ABNORMAL LOW (ref 13.0–17.0)
Immature Granulocytes: 1 %
Lymphocytes Relative: 18 %
Lymphs Abs: 1.6 10*3/uL (ref 0.7–4.0)
MCH: 28.9 pg (ref 26.0–34.0)
MCHC: 33.9 g/dL (ref 30.0–36.0)
MCV: 85.3 fL (ref 80.0–100.0)
Monocytes Absolute: 1.2 10*3/uL — ABNORMAL HIGH (ref 0.1–1.0)
Monocytes Relative: 13 %
Neutro Abs: 5.9 10*3/uL (ref 1.7–7.7)
Neutrophils Relative %: 66 %
Platelets: 305 10*3/uL (ref 150–400)
RBC: 3.39 MIL/uL — ABNORMAL LOW (ref 4.22–5.81)
RDW: 14 % (ref 11.5–15.5)
WBC: 8.9 10*3/uL (ref 4.0–10.5)
nRBC: 0 % (ref 0.0–0.2)

## 2020-02-28 MED ORDER — SODIUM CHLORIDE 0.9 % IV BOLUS
1000.0000 mL | Freq: Once | INTRAVENOUS | Status: AC
  Administered 2020-02-28: 1000 mL via INTRAVENOUS

## 2020-02-28 MED ORDER — TRANEXAMIC ACID FOR EPISTAXIS
500.0000 mg | Freq: Once | TOPICAL | Status: AC
  Administered 2020-02-28: 500 mg via TOPICAL
  Filled 2020-02-28: qty 10

## 2020-02-28 NOTE — ED Notes (Signed)
Called [facility] per Dr. Randal Buba for Pt transfer.

## 2020-02-28 NOTE — ED Provider Notes (Signed)
MSE was initiated and I personally evaluated the patient and placed orders (if any) at  11:02 PM on Feb 28, 2020.  The patient appears stable so that the remainder of the MSE may be completed by another provider.  Wound is hemostatic, patient is hemodynamically stable.   Maudie Flakes, MD 02/28/20 [id number]

## 2020-02-28 NOTE — ED Triage Notes (Signed)
Pt presents with perfuse post op bleeding, pt recently had jaw surgery and now has an open wound to mouth, pt currently holding pressure, large clots per family. Pt is on antbx for infection in jaw.

## 2020-02-28 NOTE — ED Provider Notes (Addendum)
[facility] EMERGENCY DEPARTMENT Provider Note   CSN: [id number] Arrival date & time: 02/28/20  2150     History Chief Complaint  Patient presents with  . Post-op Problem    Kaysean Littleton Calvey is a 74 y.o. male.  The history is provided by the patient and the spouse.  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  It happened 2 weeks ago and stopped but tonight's episode was unprovoked and worse and the family decided not to drive to [facility]  Patient was reportedly hypotensive 60 SBP on arrival.       Past Medical History:  Diagnosis Date  . Arthritis    OA AND PAIN RT KNEE  . Cancer ([place])    h/o neck - ABOUT 6 YRS AGO - TX'D WITH RADIATION AND CHEMO   . Chronic kidney disease   . Head and neck cancer ~ 2009   S/P radiation & [name]  . Headache(784.0)   . Hyperlipidemia   . Hypertension   . Kidney carcinoma ([place])    h/o - NEPHRECTOMY   . Prostate cancer ([place]) 05/20/14   Gleason 4+3=7, volume 25 gm  . Radiation 2015   hx of, prostate cancer  . Seizures ([place])    hx of x yrs, "the bad kind;bite tongue; STATES LAST [place] 2013; WAS SEEING [name] - HE RETIRED AND PT LAST SAW DR. [name]  . Stroke ([place]) [place] 2013   UNABLE TO SPEAK OR MOVE AND RT SIDE WEAKNESS AND LOSS OF SKIN SENSITIVITY TO HEAT AND COLD ON RT SIDE, DOUBLE VISION. BALANCE PROBLEMS---STATES STILL HAS  DOUBLE VISION AND BALANCE PROBLEM AND RT SIDED LOSS OF SKIN SENSITIVITY    Patient Active Problem List   Diagnosis Date Noted  . Chronic osteomyelitis of mandible 03/27/2018  . Pharyngeal dysphagia 03/27/2018  . Malignant neoplasm of prostate ([place]) 06/24/2014  . Elevated PSA 06/03/2014  . Hyponatremia 02/26/2014  . Postoperative anemia due to acute blood loss 02/26/2014  . OA (osteoarthritis) of knee 02/24/2014  . Dizziness 12/04/2011  . Hyperlipidemia LDL goal < 100 10/05/2011  . 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 CVA (cerebral infarction) 10/05/2011  . 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 HTN (hypertension) 09/30/2011  . Seizure disorder ([place]) 09/30/2011    Past Surgical History:  Procedure Laterality Date  . hydrocelectomy  11/2000   left  . IR FLUORO GUIDE CV LINE RIGHT  08/31/2017  . IR US GUIDE VASC ACCESS RIGHT  08/31/2017  . JOINT REPLACEMENT     LEFT TOTAL KNEE ARTHROPLASTY  . MOUTH SURGERY  2019  . NEPHRECTOMY  1990's   left  . PROSTATE BIOPSY  05/20/14   Gleason 4+3=7, vol 25 gm  . TEE WITHOUT CARDIOVERSION  10/04/2011   Procedure: TRANSESOPHAGEAL ECHOCARDIOGRAM (TEE);  Surgeon: Candee Furbish, MD;  Location: [place];  Service: Cardiovascular;  Laterality: N/A;  . TOTAL KNEE ARTHROPLASTY  2011   left  . TOTAL KNEE ARTHROPLASTY Right 02/24/2014   Procedure: RIGHT TOTAL KNEE ARTHROPLASTY;  Surgeon: Gearlean Alf, MD;  Location: [facility];  Service: Orthopedics;  Laterality: Right;       Family History  Problem Relation Age of Onset  . Diabetes Mother   . Heart disease Mother   . Heart disease Father     Social History   Tobacco Use  . Smoking status: Never Smoker  . Smokeless tobacco: Never Used  Substance Use Topics  . Alcohol use: Yes    Alcohol/week: 0.0 standard drinks    Comment: WAS 6 TO 8 BEERS DAILY   . Drug use: No    Home Medications Prior to Admission medications   Medication Sig Start Date End Date Taking? Authorizing Provider   amLODipine (NORVASC) 10 MG tablet  02/16/18   [provider]  amLODipine-valsartan (EXFORGE) 5-160 MG tablet TK 1 T PO QD 06/18/19   [provider]  amoxicillin (AMOXIL) 500 MG capsule Take 1 capsule (500 mg total) by mouth 3 (three) times daily. Patient not taking: Reported on 08/26/2019 07/11/19   Carlyle Basques, MD  aspirin 81 MG tablet Take 81 mg by mouth daily.    [provider]  Aspirin-Calcium Carbonate 81-777 MG TABS Take by mouth.    [provider]  bicalutamide (CASODEX) 50 MG tablet TK 1 T PO D 02/16/18   [provider]  chlorhexidine (PERIDEX) 0.12 % solution SWISH 15 ML PO FOR 30 SECONDS THEN SPIT BID 02/23/18   [provider]  fluconazole (DIFLUCAN) 200 MG tablet Take 2 tablets (400 mg total) by mouth daily. 10/31/19   Carlyle Basques, MD  hydrochlorothiazide (HYDRODIURIL) 12.5 MG tablet Take 1 tablet (12.5 mg total) by mouth daily. 01/23/18   Carlyle Basques, MD  lamoTRIgine (LAMICTAL) 100 MG tablet 100mg  in AM and 200mg  in PM 02/26/19   Penumalli, Earlean Polka, MD  levofloxacin (LEVAQUIN) 500 MG tablet Take 1 tablet (500 mg total) by mouth daily. 10/31/19   Carlyle Basques, MD  NONFORMULARY OR COMPOUNDED ITEM Hormone injection from Cancer MD every 30 days.    [provider]  omeprazole (PRILOSEC) 40 MG capsule  06/19/19   [provider]  penicillin v potassium (VEETID) 500 MG tablet Take 500 mg by mouth 4 (four) times daily. 08/03/19   [provider]  simvastatin (ZOCOR) 20 MG tablet Take 20 mg by mouth every evening.    [provider]  UNKNOWN TO PATIENT Hormone injection from Cancer MD every 30 days.    [provider]  valsartan (DIOVAN) 160 MG tablet Take 1 tablet (160 mg total) by mouth daily. 12/25/17   Carlyle Basques, MD    Allergies    Morphine and related  Review of Systems   Review of Systems  Constitutional: Negative for fever.  HENT: Negative for rhinorrhea and sore throat.    Respiratory: Negative for cough and shortness of breath.   Cardiovascular: Negative for chest pain.  Gastrointestinal: Negative for diarrhea.  Genitourinary: Negative for dysuria.  Musculoskeletal: Negative for arthralgias.  Skin: Negative for rash.  Neurological: Negative for dizziness.  Psychiatric/Behavioral: Negative for agitation.  All other systems reviewed and are negative.   Physical Exam Updated Vital Signs BP 123/65   Pulse 81   Resp 19   SpO2 95%   Physical Exam Vitals and nursing note reviewed.  Constitutional:      Appearance: Normal appearance.  HENT:  Head: Normocephalic and atraumatic.      Nose: Nose normal.     Mouth/Throat:   Eyes:     Conjunctiva/sclera: Conjunctivae normal.     Pupils: Pupils are equal, round, and reactive to light.  Cardiovascular:     Rate and Rhythm: Normal rate and regular rhythm.     Pulses: Normal pulses.     Heart sounds: Normal heart sounds.  Pulmonary:     Effort: Pulmonary effort is normal.     Breath sounds: Normal breath sounds.  Abdominal:     General: Abdomen is flat. Bowel sounds are normal.     Tenderness: There is no abdominal tenderness. There is no guarding.  Musculoskeletal:        General: Normal range of motion.     Cervical back: Normal range of motion and neck supple. No rigidity.  Skin:    General: Skin is warm and dry.     Capillary Refill: Capillary refill takes less than 2 seconds.  Neurological:     General: No focal deficit present.     Mental Status: He is alert and oriented to person, place, and time.     Deep Tendon Reflexes: Reflexes normal.  Psychiatric:        Mood and Affect: Mood normal.        Behavior: Behavior normal.     ED Results / Procedures / Treatments   Labs (all labs ordered are listed, but only abnormal results are displayed) Results for orders placed or performed during the hospital encounter of 02/28/20  CBC with Differential/Platelet  Result Value Ref Range    WBC 8.9 4.0 - 10.5 K/uL   RBC 3.39 (L) 4.22 - 5.81 MIL/uL   Hemoglobin 9.8 (L) 13.0 - 17.0 g/dL   HCT 28.9 (L) 39.0 - 52.0 %   MCV 85.3 80.0 - 100.0 fL   MCH 28.9 26.0 - 34.0 pg   MCHC 33.9 30.0 - 36.0 g/dL   RDW 14.0 11.5 - 15.5 %   Platelets 305 150 - 400 K/uL   nRBC 0.0 0.0 - 0.2 %   Neutrophils Relative % 66 %   Neutro Abs 5.9 1.7 - 7.7 K/uL   Lymphocytes Relative 18 %   Lymphs Abs 1.6 0.7 - 4.0 K/uL   Monocytes Relative 13 %   Monocytes Absolute 1.2 (H) 0.1 - 1.0 K/uL   Eosinophils Relative 2 %   Eosinophils Absolute 0.2 0.0 - 0.5 K/uL   Basophils Relative 0 %   Basophils Absolute 0.0 0.0 - 0.1 K/uL   Immature Granulocytes 1 %   Abs Immature Granulocytes 0.06 0.00 - 0.07 K/uL  Basic metabolic panel  Result Value Ref Range   Sodium 126 (L) 135 - 145 mmol/L   Potassium 4.4 3.5 - 5.1 mmol/L   Chloride 89 (L) 98 - 111 mmol/L   CO2 25 22 - 32 mmol/L   Glucose, Bld 117 (H) 70 - 99 mg/dL   BUN 19 8 - 23 mg/dL   Creatinine, Ser 1.01 0.61 - 1.24 mg/dL   Calcium 8.9 8.9 - 10.3 mg/dL   GFR calc non Af Amer >60 >60 mL/min   GFR calc Af Amer >60 >60 mL/min   Anion gap 12 5 - 15  Protime-INR  Result Value Ref Range   Prothrombin Time 13.5 11.4 - 15.2 seconds   INR 1.1 0.8 - 1.2   No results found.  Radiology No results found.  Procedures Procedures (including critical care time)  Medications  Ordered in ED Medications  sodium chloride 0.9 % bolus 1,000 mL (1,000 mLs Intravenous New Bag/Given 02/28/20 2318)  tranexamic acid (CYKLOKAPRON) 1000 MG/10ML topical solution 500 mg (500 mg Topical Given 02/28/20 2340)    ED Course  I have reviewed the triage vital signs and the nursing notes.  Pertinent labs & imaging results that were available during my care of the patient were reviewed by me and considered in my medical decision making (see chart for details).    I could not identify the source of the bleeding.  Clot present in the posterior oral cavity.  I did not dislodge  this.  I have initiated TXA nebulized and TXA soaked gauze.  Patient is hemodynamically stable but will need to be seen by a specialist and we do not have OMFS nor dentistry on call   Case d/w Dr. Joseph Art on call for Dr. Patrina Levering.  There is no space but will contact ED  1212 case d/w Dr. Amalia Hailey in the ED at [facility] who agrees to accept the patient in transfer to the ED  Airway is stable at this time and there is no indication for intubation  Patient will be kept NPO to prevent oral rebleed and in preparation for possible procedure  [facility] has been contacted for transfer ED to ED Final Clinical Impression(s) / ED Diagnoses Post operative bleeding: plan transfer to [facility] for further evaluation       Grayson Pfefferle, MD 02/29/20 [id number]

## 2020-02-29 DIAGNOSIS — K91841 Postprocedural hemorrhage and hematoma of a digestive system organ or structure following other procedure: Secondary | ICD-10-CM | POA: Diagnosis not present

## 2020-02-29 DIAGNOSIS — R270 Ataxia, unspecified: Secondary | ICD-10-CM | POA: Diagnosis not present

## 2020-02-29 DIAGNOSIS — Z8546 Personal history of malignant neoplasm of prostate: Secondary | ICD-10-CM | POA: Diagnosis not present

## 2020-02-29 DIAGNOSIS — R710 Precipitous drop in hematocrit: Secondary | ICD-10-CM | POA: Diagnosis not present

## 2020-02-29 DIAGNOSIS — Z885 Allergy status to narcotic agent status: Secondary | ICD-10-CM | POA: Diagnosis not present

## 2020-02-29 DIAGNOSIS — K91840 Postprocedural hemorrhage and hematoma of a digestive system organ or structure following a digestive system procedure: Secondary | ICD-10-CM | POA: Diagnosis not present

## 2020-02-29 DIAGNOSIS — Z20822 Contact with and (suspected) exposure to covid-19: Secondary | ICD-10-CM | POA: Diagnosis not present

## 2020-02-29 DIAGNOSIS — K1379 Other lesions of oral mucosa: Secondary | ICD-10-CM | POA: Diagnosis present

## 2020-02-29 DIAGNOSIS — Z85528 Personal history of other malignant neoplasm of kidney: Secondary | ICD-10-CM | POA: Diagnosis not present

## 2020-02-29 DIAGNOSIS — I10 Essential (primary) hypertension: Secondary | ICD-10-CM | POA: Diagnosis not present

## 2020-02-29 DIAGNOSIS — R220 Localized swelling, mass and lump, head: Secondary | ICD-10-CM | POA: Diagnosis not present

## 2020-02-29 DIAGNOSIS — I69398 Other sequelae of cerebral infarction: Secondary | ICD-10-CM | POA: Diagnosis not present

## 2020-02-29 DIAGNOSIS — C61 Malignant neoplasm of prostate: Secondary | ICD-10-CM | POA: Diagnosis not present

## 2020-02-29 DIAGNOSIS — E785 Hyperlipidemia, unspecified: Secondary | ICD-10-CM | POA: Diagnosis not present

## 2020-02-29 DIAGNOSIS — Z79899 Other long term (current) drug therapy: Secondary | ICD-10-CM | POA: Diagnosis not present

## 2020-02-29 DIAGNOSIS — C099 Malignant neoplasm of tonsil, unspecified: Secondary | ICD-10-CM | POA: Diagnosis not present

## 2020-02-29 DIAGNOSIS — K219 Gastro-esophageal reflux disease without esophagitis: Secondary | ICD-10-CM | POA: Diagnosis not present

## 2020-02-29 DIAGNOSIS — Z923 Personal history of irradiation: Secondary | ICD-10-CM | POA: Diagnosis not present

## 2020-02-29 DIAGNOSIS — Z9221 Personal history of antineoplastic chemotherapy: Secondary | ICD-10-CM | POA: Diagnosis not present

## 2020-02-29 DIAGNOSIS — I69392 Facial weakness following cerebral infarction: Secondary | ICD-10-CM | POA: Diagnosis not present

## 2020-02-29 DIAGNOSIS — R201 Hypoesthesia of skin: Secondary | ICD-10-CM | POA: Diagnosis not present

## 2020-02-29 DIAGNOSIS — C7951 Secondary malignant neoplasm of bone: Secondary | ICD-10-CM | POA: Diagnosis not present

## 2020-02-29 DIAGNOSIS — Z7982 Long term (current) use of aspirin: Secondary | ICD-10-CM | POA: Diagnosis not present

## 2020-02-29 DIAGNOSIS — Z905 Acquired absence of kidney: Secondary | ICD-10-CM | POA: Diagnosis not present

## 2020-02-29 DIAGNOSIS — R918 Other nonspecific abnormal finding of lung field: Secondary | ICD-10-CM | POA: Diagnosis not present

## 2020-02-29 DIAGNOSIS — Z9889 Other specified postprocedural states: Secondary | ICD-10-CM | POA: Diagnosis not present

## 2020-02-29 DIAGNOSIS — Z96653 Presence of artificial knee joint, bilateral: Secondary | ICD-10-CM | POA: Diagnosis not present

## 2020-02-29 LAB — RESPIRATORY PANEL BY RT PCR (FLU A&B, COVID)
Influenza A by PCR: NEGATIVE
Influenza B by PCR: NEGATIVE
SARS Coronavirus 2 by RT PCR: NEGATIVE

## 2020-02-29 MED ORDER — TRANEXAMIC ACID FOR EPISTAXIS
500.0000 mg | Freq: Once | TOPICAL | Status: AC
  Administered 2020-02-29: 500 mg via TOPICAL
  Filled 2020-02-29: qty 10

## 2020-02-29 NOTE — ED Notes (Signed)
Report given to Emergency planning/management officer at [facility]. [facility] at bedside for transport, MD [place] at bedside for transport.

## 2020-02-29 NOTE — ED Notes (Signed)
[facility] to transport patient to [facility]

## 2020-03-02 DIAGNOSIS — R42 Dizziness and giddiness: Secondary | ICD-10-CM | POA: Diagnosis not present

## 2020-03-03 ENCOUNTER — Other Ambulatory Visit: Payer: Self-pay | Admitting: *Deleted

## 2020-03-03 DIAGNOSIS — M272 Inflammatory conditions of jaws: Secondary | ICD-10-CM

## 2020-03-03 MED ORDER — LEVOFLOXACIN 500 MG PO TABS: 500.0000 mg | ORAL_TABLET | Freq: Every day | ORAL | 0 refills | Status: DC

## 2020-03-03 NOTE — Progress Notes (Signed)
OK to fill Levofloxacin 500 mg daily for #30 days per Dr Baxter Flattery. Landis Gandy, RN

## 2020-03-04 ENCOUNTER — Ambulatory Visit: Payer: PPO | Admitting: Internal Medicine

## 2020-03-05 DIAGNOSIS — R42 Dizziness and giddiness: Secondary | ICD-10-CM | POA: Diagnosis not present

## 2020-03-05 DIAGNOSIS — I10 Essential (primary) hypertension: Secondary | ICD-10-CM | POA: Diagnosis not present

## 2020-03-05 DIAGNOSIS — D509 Iron deficiency anemia, unspecified: Secondary | ICD-10-CM | POA: Diagnosis not present

## 2020-03-05 DIAGNOSIS — D649 Anemia, unspecified: Secondary | ICD-10-CM | POA: Diagnosis not present

## 2020-03-11 ENCOUNTER — Telehealth: Payer: Self-pay | Admitting: Diagnostic Neuroimaging

## 2020-03-11 NOTE — Telephone Encounter (Signed)
Pt called stating that he is having a headache on the left side of his head for a while and he is not able to lay down now and has to sleep sitting up. He would like to discuss with RN or provider to see what is advised to him.

## 2020-03-11 NOTE — Telephone Encounter (Signed)
Called patient who stated he has headache on left side when he lays down. Tylenol, ibuprofen don't help. He stated this has gone on for a year or more. He had oral surgery in past to remove damaged teeth and he thought that would take care of headache. He had surgery to jaw in April at [facility]. He has not called Engineer, drilling to discuss. He is seeing Dr Inda Merlin, PCP tomorrow to discuss BP issues, and I advised he explain symptoms to PCP. If PCP feels Dr [name] needs to evaluate him, patient will call back. Patient verbalized understanding, appreciation.

## 2020-03-13 DIAGNOSIS — I10 Essential (primary) hypertension: Secondary | ICD-10-CM | POA: Diagnosis not present

## 2020-03-13 DIAGNOSIS — R42 Dizziness and giddiness: Secondary | ICD-10-CM | POA: Diagnosis not present

## 2020-03-19 DIAGNOSIS — A09 Infectious gastroenteritis and colitis, unspecified: Secondary | ICD-10-CM | POA: Diagnosis not present

## 2020-03-19 DIAGNOSIS — J218 Acute bronchiolitis due to other specified organisms: Secondary | ICD-10-CM | POA: Diagnosis not present

## 2020-03-19 DIAGNOSIS — J189 Pneumonia, unspecified organism: Secondary | ICD-10-CM | POA: Diagnosis not present

## 2020-03-20 DIAGNOSIS — J189 Pneumonia, unspecified organism: Secondary | ICD-10-CM | POA: Diagnosis not present

## 2020-03-21 ENCOUNTER — Emergency Department (HOSPITAL_COMMUNITY): Payer: PPO

## 2020-03-21 ENCOUNTER — Inpatient Hospital Stay (HOSPITAL_COMMUNITY)
Admission: EM | Admit: 2020-03-21 | Discharge: 2020-04-09 | DRG: 233 | Disposition: A | Discharge status: Home Health | Payer: PPO | Attending: Cardiothoracic Surgery | Admitting: Cardiothoracic Surgery

## 2020-03-21 ENCOUNTER — Encounter (HOSPITAL_COMMUNITY): Payer: Self-pay | Admitting: *Deleted

## 2020-03-21 ENCOUNTER — Other Ambulatory Visit: Payer: Self-pay

## 2020-03-21 DIAGNOSIS — I5023 Acute on chronic systolic (congestive) heart failure: Secondary | ICD-10-CM | POA: Diagnosis present

## 2020-03-21 DIAGNOSIS — Z923 Personal history of irradiation: Secondary | ICD-10-CM | POA: Diagnosis not present

## 2020-03-21 DIAGNOSIS — I25118 Atherosclerotic heart disease of native coronary artery with other forms of angina pectoris: Secondary | ICD-10-CM | POA: Diagnosis not present

## 2020-03-21 DIAGNOSIS — Z4659 Encounter for fitting and adjustment of other gastrointestinal appliance and device: Secondary | ICD-10-CM

## 2020-03-21 DIAGNOSIS — Z85818 Personal history of malignant neoplasm of other sites of lip, oral cavity, and pharynx: Secondary | ICD-10-CM | POA: Diagnosis not present

## 2020-03-21 DIAGNOSIS — E782 Mixed hyperlipidemia: Secondary | ICD-10-CM | POA: Diagnosis not present

## 2020-03-21 DIAGNOSIS — Z48812 Encounter for surgical aftercare following surgery on the circulatory system: Secondary | ICD-10-CM | POA: Diagnosis not present

## 2020-03-21 DIAGNOSIS — E876 Hypokalemia: Secondary | ICD-10-CM | POA: Diagnosis not present

## 2020-03-21 DIAGNOSIS — Z6826 Body mass index (BMI) 26.0-26.9, adult: Secondary | ICD-10-CM

## 2020-03-21 DIAGNOSIS — J939 Pneumothorax, unspecified: Secondary | ICD-10-CM | POA: Diagnosis not present

## 2020-03-21 DIAGNOSIS — R0602 Shortness of breath: Secondary | ICD-10-CM | POA: Diagnosis not present

## 2020-03-21 DIAGNOSIS — I69398 Other sequelae of cerebral infarction: Secondary | ICD-10-CM | POA: Diagnosis not present

## 2020-03-21 DIAGNOSIS — I69351 Hemiplegia and hemiparesis following cerebral infarction affecting right dominant side: Secondary | ICD-10-CM | POA: Diagnosis not present

## 2020-03-21 DIAGNOSIS — I951 Orthostatic hypotension: Secondary | ICD-10-CM | POA: Diagnosis present

## 2020-03-21 DIAGNOSIS — I10 Essential (primary) hypertension: Secondary | ICD-10-CM | POA: Diagnosis present

## 2020-03-21 DIAGNOSIS — Z96653 Presence of artificial knee joint, bilateral: Secondary | ICD-10-CM | POA: Diagnosis present

## 2020-03-21 DIAGNOSIS — Z951 Presence of aortocoronary bypass graft: Secondary | ICD-10-CM

## 2020-03-21 DIAGNOSIS — Z20822 Contact with and (suspected) exposure to covid-19: Secondary | ICD-10-CM | POA: Diagnosis present

## 2020-03-21 DIAGNOSIS — Z01810 Encounter for preprocedural cardiovascular examination: Secondary | ICD-10-CM | POA: Diagnosis not present

## 2020-03-21 DIAGNOSIS — E1122 Type 2 diabetes mellitus with diabetic chronic kidney disease: Secondary | ICD-10-CM | POA: Diagnosis not present

## 2020-03-21 DIAGNOSIS — I255 Ischemic cardiomyopathy: Secondary | ICD-10-CM | POA: Diagnosis present

## 2020-03-21 DIAGNOSIS — Z85528 Personal history of other malignant neoplasm of kidney: Secondary | ICD-10-CM

## 2020-03-21 DIAGNOSIS — C7951 Secondary malignant neoplasm of bone: Secondary | ICD-10-CM | POA: Diagnosis not present

## 2020-03-21 DIAGNOSIS — I5041 Acute combined systolic (congestive) and diastolic (congestive) heart failure: Secondary | ICD-10-CM | POA: Diagnosis not present

## 2020-03-21 DIAGNOSIS — I69354 Hemiplegia and hemiparesis following cerebral infarction affecting left non-dominant side: Secondary | ICD-10-CM | POA: Diagnosis not present

## 2020-03-21 DIAGNOSIS — I509 Heart failure, unspecified: Secondary | ICD-10-CM

## 2020-03-21 DIAGNOSIS — Z7982 Long term (current) use of aspirin: Secondary | ICD-10-CM | POA: Diagnosis not present

## 2020-03-21 DIAGNOSIS — E440 Moderate protein-calorie malnutrition: Secondary | ICD-10-CM | POA: Diagnosis not present

## 2020-03-21 DIAGNOSIS — I214 Non-ST elevation (NSTEMI) myocardial infarction: Secondary | ICD-10-CM

## 2020-03-21 DIAGNOSIS — K219 Gastro-esophageal reflux disease without esophagitis: Secondary | ICD-10-CM | POA: Diagnosis present

## 2020-03-21 DIAGNOSIS — C61 Malignant neoplasm of prostate: Secondary | ICD-10-CM | POA: Diagnosis not present

## 2020-03-21 DIAGNOSIS — M272 Inflammatory conditions of jaws: Secondary | ICD-10-CM | POA: Diagnosis not present

## 2020-03-21 DIAGNOSIS — Z885 Allergy status to narcotic agent status: Secondary | ICD-10-CM

## 2020-03-21 DIAGNOSIS — I25110 Atherosclerotic heart disease of native coronary artery with unstable angina pectoris: Secondary | ICD-10-CM | POA: Diagnosis not present

## 2020-03-21 DIAGNOSIS — C099 Malignant neoplasm of tonsil, unspecified: Secondary | ICD-10-CM | POA: Diagnosis not present

## 2020-03-21 DIAGNOSIS — R2689 Other abnormalities of gait and mobility: Secondary | ICD-10-CM | POA: Diagnosis present

## 2020-03-21 DIAGNOSIS — Z931 Gastrostomy status: Secondary | ICD-10-CM | POA: Diagnosis not present

## 2020-03-21 DIAGNOSIS — Z833 Family history of diabetes mellitus: Secondary | ICD-10-CM | POA: Diagnosis not present

## 2020-03-21 DIAGNOSIS — Z8249 Family history of ischemic heart disease and other diseases of the circulatory system: Secondary | ICD-10-CM

## 2020-03-21 DIAGNOSIS — R1319 Other dysphagia: Secondary | ICD-10-CM

## 2020-03-21 DIAGNOSIS — I083 Combined rheumatic disorders of mitral, aortic and tricuspid valves: Secondary | ICD-10-CM | POA: Diagnosis not present

## 2020-03-21 DIAGNOSIS — H532 Diplopia: Secondary | ICD-10-CM | POA: Diagnosis present

## 2020-03-21 DIAGNOSIS — G40909 Epilepsy, unspecified, not intractable, without status epilepticus: Secondary | ICD-10-CM | POA: Diagnosis present

## 2020-03-21 DIAGNOSIS — I700 Atherosclerosis of aorta: Secondary | ICD-10-CM | POA: Diagnosis present

## 2020-03-21 DIAGNOSIS — D62 Acute posthemorrhagic anemia: Secondary | ICD-10-CM | POA: Diagnosis not present

## 2020-03-21 DIAGNOSIS — R778 Other specified abnormalities of plasma proteins: Secondary | ICD-10-CM | POA: Diagnosis present

## 2020-03-21 DIAGNOSIS — J9811 Atelectasis: Secondary | ICD-10-CM | POA: Diagnosis not present

## 2020-03-21 DIAGNOSIS — J189 Pneumonia, unspecified organism: Secondary | ICD-10-CM | POA: Diagnosis not present

## 2020-03-21 DIAGNOSIS — Z4682 Encounter for fitting and adjustment of non-vascular catheter: Secondary | ICD-10-CM | POA: Diagnosis not present

## 2020-03-21 DIAGNOSIS — Z905 Acquired absence of kidney: Secondary | ICD-10-CM

## 2020-03-21 DIAGNOSIS — I5021 Acute systolic (congestive) heart failure: Secondary | ICD-10-CM

## 2020-03-21 DIAGNOSIS — I088 Other rheumatic multiple valve diseases: Secondary | ICD-10-CM | POA: Diagnosis not present

## 2020-03-21 DIAGNOSIS — Z79899 Other long term (current) drug therapy: Secondary | ICD-10-CM

## 2020-03-21 DIAGNOSIS — R1310 Dysphagia, unspecified: Secondary | ICD-10-CM | POA: Diagnosis not present

## 2020-03-21 DIAGNOSIS — N189 Chronic kidney disease, unspecified: Secondary | ICD-10-CM | POA: Diagnosis not present

## 2020-03-21 DIAGNOSIS — Z9221 Personal history of antineoplastic chemotherapy: Secondary | ICD-10-CM | POA: Diagnosis not present

## 2020-03-21 DIAGNOSIS — R0600 Dyspnea, unspecified: Secondary | ICD-10-CM | POA: Diagnosis not present

## 2020-03-21 DIAGNOSIS — Z431 Encounter for attention to gastrostomy: Secondary | ICD-10-CM | POA: Diagnosis not present

## 2020-03-21 DIAGNOSIS — M8780 Other osteonecrosis, unspecified bone: Secondary | ICD-10-CM | POA: Diagnosis not present

## 2020-03-21 DIAGNOSIS — M47814 Spondylosis without myelopathy or radiculopathy, thoracic region: Secondary | ICD-10-CM | POA: Diagnosis not present

## 2020-03-21 DIAGNOSIS — R1312 Dysphagia, oropharyngeal phase: Secondary | ICD-10-CM | POA: Diagnosis not present

## 2020-03-21 DIAGNOSIS — Z7984 Long term (current) use of oral hypoglycemic drugs: Secondary | ICD-10-CM

## 2020-03-21 DIAGNOSIS — I130 Hypertensive heart and chronic kidney disease with heart failure and stage 1 through stage 4 chronic kidney disease, or unspecified chronic kidney disease: Secondary | ICD-10-CM | POA: Diagnosis present

## 2020-03-21 DIAGNOSIS — I110 Hypertensive heart disease with heart failure: Secondary | ICD-10-CM | POA: Diagnosis not present

## 2020-03-21 DIAGNOSIS — J129 Viral pneumonia, unspecified: Secondary | ICD-10-CM | POA: Diagnosis not present

## 2020-03-21 DIAGNOSIS — I6522 Occlusion and stenosis of left carotid artery: Secondary | ICD-10-CM | POA: Diagnosis not present

## 2020-03-21 DIAGNOSIS — M5136 Other intervertebral disc degeneration, lumbar region: Secondary | ICD-10-CM | POA: Diagnosis not present

## 2020-03-21 DIAGNOSIS — T451X5D Adverse effect of antineoplastic and immunosuppressive drugs, subsequent encounter: Secondary | ICD-10-CM | POA: Diagnosis not present

## 2020-03-21 DIAGNOSIS — M1990 Unspecified osteoarthritis, unspecified site: Secondary | ICD-10-CM | POA: Diagnosis present

## 2020-03-21 DIAGNOSIS — R079 Chest pain, unspecified: Secondary | ICD-10-CM | POA: Diagnosis not present

## 2020-03-21 DIAGNOSIS — K5900 Constipation, unspecified: Secondary | ICD-10-CM | POA: Diagnosis not present

## 2020-03-21 DIAGNOSIS — J90 Pleural effusion, not elsewhere classified: Secondary | ICD-10-CM | POA: Diagnosis not present

## 2020-03-21 DIAGNOSIS — I0981 Rheumatic heart failure: Secondary | ICD-10-CM | POA: Diagnosis not present

## 2020-03-21 DIAGNOSIS — I2510 Atherosclerotic heart disease of native coronary artery without angina pectoris: Secondary | ICD-10-CM

## 2020-03-21 HISTORY — DX: Osteonecrosis, unspecified: M87.9

## 2020-03-21 HISTORY — DX: Gastro-esophageal reflux disease without esophagitis: K21.9

## 2020-03-21 LAB — URINALYSIS, COMPLETE (UACMP) WITH MICROSCOPIC
Bacteria, UA: NONE SEEN
Bilirubin Urine: NEGATIVE
Glucose, UA: NEGATIVE mg/dL
Hgb urine dipstick: NEGATIVE
Ketones, ur: NEGATIVE mg/dL
Leukocytes,Ua: NEGATIVE
Nitrite: NEGATIVE
Protein, ur: 30 mg/dL — AB
Specific Gravity, Urine: 1.015 (ref 1.005–1.030)
pH: 7 (ref 5.0–8.0)

## 2020-03-21 LAB — CBC
HCT: 27.5 % — ABNORMAL LOW (ref 39.0–52.0)
Hemoglobin: 8.5 g/dL — ABNORMAL LOW (ref 13.0–17.0)
MCH: 28.3 pg (ref 26.0–34.0)
MCHC: 30.9 g/dL (ref 30.0–36.0)
MCV: 91.7 fL (ref 80.0–100.0)
Platelets: 318 10*3/uL (ref 150–400)
RBC: 3 MIL/uL — ABNORMAL LOW (ref 4.22–5.81)
RDW: 14.9 % (ref 11.5–15.5)
WBC: 6.1 10*3/uL (ref 4.0–10.5)
nRBC: 0 % (ref 0.0–0.2)

## 2020-03-21 LAB — BRAIN NATRIURETIC PEPTIDE: B Natriuretic Peptide: 3210.4 pg/mL — ABNORMAL HIGH (ref 0.0–100.0)

## 2020-03-21 LAB — HEPATIC FUNCTION PANEL
ALT: 13 U/L (ref 0–44)
AST: 21 U/L (ref 15–41)
Albumin: 3.6 g/dL (ref 3.5–5.0)
Alkaline Phosphatase: 84 U/L (ref 38–126)
Bilirubin, Direct: <0.1 mg/dL (ref 0.0–0.2)
Total Bilirubin: 0.4 mg/dL (ref 0.3–1.2)
Total Protein: 6.8 g/dL (ref 6.5–8.1)

## 2020-03-21 LAB — BASIC METABOLIC PANEL
Anion gap: 11 (ref 5–15)
BUN: 24 mg/dL — ABNORMAL HIGH (ref 8–23)
CO2: 27 mmol/L (ref 22–32)
Calcium: 9.2 mg/dL (ref 8.9–10.3)
Chloride: 92 mmol/L — ABNORMAL LOW (ref 98–111)
Creatinine, Ser: 1.11 mg/dL (ref 0.61–1.24)
GFR calc Af Amer: >60 mL/min (ref >60)
GFR calc non Af Amer: >60 mL/min (ref >60)
Glucose, Bld: 132 mg/dL — ABNORMAL HIGH (ref 70–99)
Potassium: 4.8 mmol/L (ref 3.5–5.1)
Sodium: 130 mmol/L — ABNORMAL LOW (ref 135–145)

## 2020-03-21 LAB — TROPONIN I (HIGH SENSITIVITY)
Troponin I (High Sensitivity): 616 ng/L (ref <18)
Troponin I (High Sensitivity): 569 ng/L (ref <18)

## 2020-03-21 LAB — SARS CORONAVIRUS 2 BY RT PCR (HOSPITAL ORDER, PERFORMED IN ~~LOC~~ HOSPITAL LAB): SARS Coronavirus 2: NEGATIVE

## 2020-03-21 MED ORDER — LAMOTRIGINE 100 MG PO TABS
100.0000 mg | ORAL_TABLET | Freq: Every morning | ORAL | Status: DC
  Administered 2020-03-22 – 2020-04-09 (×16): 100 mg via ORAL
  Filled 2020-03-21 (×20): qty 1

## 2020-03-21 MED ORDER — SODIUM CHLORIDE 0.9% FLUSH
3.0000 mL | Freq: Once | INTRAVENOUS | Status: AC
  Administered 2020-03-21: 3 mL via INTRAVENOUS

## 2020-03-21 MED ORDER — ONDANSETRON HCL 4 MG/2ML IJ SOLN: 4.0000 mg | Freq: Four times a day (QID) | INTRAMUSCULAR | Status: DC | PRN

## 2020-03-21 MED ORDER — LAMOTRIGINE 100 MG PO TABS
200.0000 mg | ORAL_TABLET | Freq: Every evening | ORAL | Status: DC
  Administered 2020-03-22 – 2020-04-08 (×15): 200 mg via ORAL
  Filled 2020-03-21 (×21): qty 2

## 2020-03-21 MED ORDER — IPRATROPIUM-ALBUTEROL 0.5-2.5 (3) MG/3ML IN SOLN: 3.0000 mL | RESPIRATORY_TRACT | Status: DC | PRN

## 2020-03-21 MED ORDER — IRBESARTAN 150 MG PO TABS: 150.0000 mg | ORAL_TABLET | Freq: Every day | ORAL | Status: DC

## 2020-03-21 MED ORDER — ACETAMINOPHEN 325 MG PO TABS: 650.0000 mg | ORAL_TABLET | ORAL | Status: DC | PRN

## 2020-03-21 MED ORDER — FUROSEMIDE 10 MG/ML IJ SOLN
40.0000 mg | Freq: Two times a day (BID) | INTRAMUSCULAR | Status: DC
  Administered 2020-03-21 – 2020-03-22 (×3): 40 mg via INTRAVENOUS
  Filled 2020-03-21 (×3): qty 4

## 2020-03-21 MED ORDER — SODIUM CHLORIDE 0.9 % IV SOLN: 250.0000 mL | INTRAVENOUS | Status: DC | PRN

## 2020-03-21 MED ORDER — SODIUM CHLORIDE 0.9% FLUSH: 3.0000 mL | INTRAVENOUS | Status: DC | PRN

## 2020-03-21 MED ORDER — POLYETHYLENE GLYCOL 3350 17 G PO PACK
17.0000 g | PACK | Freq: Every day | ORAL | Status: DC | PRN
  Administered 2020-03-22 – 2020-03-24 (×3): 17 g via ORAL
  Filled 2020-03-21 (×3): qty 1

## 2020-03-21 MED ORDER — ASPIRIN 81 MG PO CHEW
81.0000 mg | CHEWABLE_TABLET | Freq: Every day | ORAL | Status: DC
  Administered 2020-03-22 – 2020-03-26 (×5): 81 mg via ORAL
  Filled 2020-03-21 (×5): qty 1

## 2020-03-21 MED ORDER — ENSURE ENLIVE PO LIQD: 237.0000 mL | Freq: Three times a day (TID) | ORAL | Status: DC

## 2020-03-21 MED ORDER — SODIUM CHLORIDE 0.9% FLUSH
3.0000 mL | Freq: Two times a day (BID) | INTRAVENOUS | Status: DC
  Administered 2020-03-21 – 2020-03-24 (×4): 3 mL via INTRAVENOUS

## 2020-03-21 MED ORDER — IOHEXOL 350 MG/ML SOLN
100.0000 mL | Freq: Once | INTRAVENOUS | Status: AC | PRN
  Administered 2020-03-21: 100 mL via INTRAVENOUS

## 2020-03-21 MED ORDER — CHLORHEXIDINE GLUCONATE 0.12 % MT SOLN
15.0000 mL | Freq: Two times a day (BID) | OROMUCOSAL | Status: DC
  Administered 2020-03-22 – 2020-03-26 (×9): 15 mL via OROMUCOSAL
  Filled 2020-03-21 (×10): qty 15

## 2020-03-21 MED ORDER — PANTOPRAZOLE SODIUM 40 MG PO TBEC
40.0000 mg | DELAYED_RELEASE_TABLET | Freq: Every day | ORAL | Status: DC
  Administered 2020-03-22 – 2020-03-26 (×5): 40 mg via ORAL
  Filled 2020-03-21 (×5): qty 1

## 2020-03-21 MED ORDER — SODIUM CHLORIDE 0.9 % IV BOLUS: 250.0000 mL | Freq: Once | INTRAVENOUS | Status: DC

## 2020-03-21 MED ORDER — SIMVASTATIN 20 MG PO TABS
20.0000 mg | ORAL_TABLET | Freq: Every evening | ORAL | Status: DC
  Administered 2020-03-22 – 2020-03-26 (×6): 20 mg via ORAL
  Filled 2020-03-21 (×6): qty 1

## 2020-03-21 MED ORDER — LEVOFLOXACIN 500 MG PO TABS
500.0000 mg | ORAL_TABLET | Freq: Every day | ORAL | Status: DC
  Administered 2020-03-22 – 2020-03-26 (×6): 500 mg via ORAL
  Filled 2020-03-21 (×7): qty 1

## 2020-03-21 NOTE — H&P (Signed)
History and Physical    Connor [name] [id number] DOB: February 15, 1946 DOA: 03/21/2020  PCP: Josetta Huddle, MD  Patient coming from: Home   Chief Complaint:  Chief Complaint  Patient presents with  . Dizziness  . 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  Patient explains that approximately 6 months ago he began to experience bouts of lightheadedness and weakness upon rising from a seated position.  Despite several adjustments to his home regimen of antihypertensives continued to be symptomatic and several weeks ago he was placed on fludrocortisone by his primary care provider.  In the past 1/2 weeks since initiation of fludrocortisone, patient states that he has experienced rapid weight gain, at least 10 pounds in the span of time.  Additionally, patient has experienced rapidly increasing abdominal girth, paroxysmal nocturnal dyspnea and pillow orthopnea.  Patient is also complaining of dyspnea on exertion, moderate to severe in intensity.  Patient is also complaining of cough, occasionally productive with yellow sputum.    Patient symptoms continue to worsen until he was evaluated at a local urgent care clinic in [facility] on 5/29 where he was found to have a markedly elevated BNP in excess of 1000.  Patient was also found to have findings on chest x-ray concerning for possible developing pneumonia.  Patient was prescribed a regimen of  levofloxacin by the provider there although it was unbeknownst to them that patient is already on chronic levofloxacin therapy.  Patient symptoms continue to worsen until he eventually presented to [facility] emergency department for evaluation.  Upon evaluation in the emergency department, BNP is now been found to be 3210.  Additionally, patient is found to have elevated troponin of 616 with serial troponin of 569.  CT chest performed in the emergency department revealed bilateral pleural effusions with a moderate right and small left pleural effusion with associated partial compressive atelectasis of the bilateral lower lobes.  Case was discussed with Dr. Paticia Stack with of cardiology who recommended serial cardiac enzymes, monitoring patient on telemetry and obtaining echocardiogram in the morning.  He recommended formal cardiology consultation in the morning if patient clinically worsened or cardiac enzymes increased.  The hospitalist group was then called to assess the patient for admission the hospital.  Review of Systems: A 10-system review of systems has been performed and all systems are negative with the exception of what is listed in the HPI and the following:  GENERAL: Complains of generalized weakness   Past Medical History:  Diagnosis Date  . Arthritis    OA AND PAIN RT KNEE  . Cancer ([place])    h/o neck - ABOUT 6 YRS AGO - TX'D WITH RADIATION AND CHEMO   . Chronic kidney disease   . GERD without esophagitis 03/21/2020  . Head and neck cancer ~ 2009   S/P radiation & [name]  . Headache(784.0)   . Hyperlipidemia   . Hypertension   . Kidney carcinoma ([place])    h/o - NEPHRECTOMY   . 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 Prostate cancer ([place]) 05/20/14   Gleason 4+3=7, volume 25 gm  . Radiation 2015   hx of, prostate cancer  . Seizures ([place])    hx of x yrs, "the bad kind;bite tongue; STATES LAST [place] 2013; WAS  SEEING [name] - HE RETIRED AND PT LAST SAW DR. [name]  . Stroke ([place]) [place] 2013   UNABLE TO SPEAK OR MOVE AND RT SIDE WEAKNESS AND LOSS OF SKIN SENSITIVITY TO HEAT AND COLD ON RT SIDE, DOUBLE VISION. BALANCE PROBLEMS---STATES STILL HAS DOUBLE VISION AND BALANCE PROBLEM AND RT SIDED LOSS OF SKIN SENSITIVITY    Past Surgical History:  Procedure Laterality Date  . hydrocelectomy  11/2000   left  . IR FLUORO GUIDE CV LINE RIGHT  08/31/2017  . IR US GUIDE VASC ACCESS RIGHT  08/31/2017  . JOINT REPLACEMENT     LEFT TOTAL KNEE ARTHROPLASTY  . MOUTH SURGERY  2019  . NEPHRECTOMY  1990's   left  . PROSTATE BIOPSY  05/20/14   Gleason 4+3=7, vol 25 gm  . TEE WITHOUT CARDIOVERSION  10/04/2011   Procedure: TRANSESOPHAGEAL ECHOCARDIOGRAM (TEE);  Surgeon: Candee Furbish, MD;  Location: [facility] ENDOSCOPY;  Service: Cardiovascular;  Laterality: N/A;  . TOTAL KNEE ARTHROPLASTY  2011   left  . TOTAL KNEE ARTHROPLASTY Right 02/24/2014   Procedure: RIGHT TOTAL KNEE ARTHROPLASTY;  Surgeon: Gearlean Alf, MD;  Location: [facility] ORS;  Service: Orthopedics;  Laterality: Right;     reports that he has never smoked. He has never used smokeless tobacco. He reports current alcohol use. He reports that he does not use drugs.  Allergies  Allergen Reactions  . Morphine And Related Nausea And Vomiting    Family History  Problem Relation Age of Onset  . Diabetes Mother   . Heart disease Mother   . Heart disease Father      Prior to Admission medications   Medication Sig Start Date End Date Taking? Authorizing Provider  aspirin 81 MG tablet Take 81 mg by mouth daily.   Yes [provider]  Cyanocobalamin (VITAMIN B-12 PO) Take 1 tablet by mouth daily.   Yes [provider]  ferrous sulfate 325 (65 FE) MG tablet Take 325 mg by mouth 2 (two) times daily with a meal.   Yes [provider]  fluconazole (DIFLUCAN) 200 MG tablet Take 2 tablets (400 mg total) by mouth daily. 10/31/19  Yes Carlyle Basques, MD  fludrocortisone (FLORINEF) 0.1 MG tablet Take 100 mcg by mouth daily. 03/05/20  Yes [provider]  lamoTRIgine (LAMICTAL) 100 MG tablet 100mg  in AM and 200mg  in PM Patient taking differently: Take 100-200 mg by mouth See admin instructions. Take 1 tablet every morning and take 2 tablets every evening 02/26/19  Yes Penumalli, Vikram R, MD  levofloxacin (LEVAQUIN) 500 MG tablet Take 1 tablet (500 mg total) by mouth daily. Patient taking differently: Take 500 mg by mouth every evening.  03/03/20  Yes Carlyle Basques, MD  simvastatin (ZOCOR) 20 MG tablet Take 20 mg by mouth every evening.   Yes [provider]  amLODipine (NORVASC) 10 MG tablet  02/16/18   [provider]  amLODipine-valsartan (EXFORGE) 5-160 MG tablet TK 1 T PO QD 06/18/19   [provider]  amoxicillin (AMOXIL) 500 MG capsule Take 1 capsule (500 mg total) by mouth 3 (three) times daily. Patient not taking: Reported on 08/26/2019 07/11/19   Carlyle Basques, MD  Aspirin-Calcium Carbonate 81-777 MG TABS Take by mouth.    [provider]  bicalutamide (CASODEX) 50 MG tablet TK 1 T PO D 02/16/18   [provider]  chlorhexidine (PERIDEX) 0.12 % solution SWISH 15 ML PO FOR 30 SECONDS THEN SPIT BID 02/23/18   [provider]  hydrochlorothiazide (HYDRODIURIL) 12.5 MG tablet Take 1 tablet (12.5 mg total) by mouth daily. 01/23/18   Carlyle Basques, MD  NONFORMULARY OR COMPOUNDED ITEM Hormone injection from Cancer MD every 30 days.    [provider]  omeprazole (PRILOSEC) 40 MG capsule  06/19/19   [provider]  penicillin v potassium (VEETID) 500 MG tablet Take 500 mg by mouth 4 (four) times daily. 08/03/19   [provider]  UNKNOWN TO PATIENT Hormone injection from Cancer MD every 30 days.    [provider]  valsartan (DIOVAN) 160 MG tablet Take 1 tablet (160 mg total) by mouth daily. 12/25/17   Carlyle Basques, MD    Physical Exam:  Vitals:   03/21/20 1945 03/21/20 2000 03/21/20 2030 03/21/20 2100  BP: 102/64 125/82 126/79 125/84  Pulse: 90 87 91 88  Resp: 20 15 20 20   Temp:      TempSrc:      SpO2: 92%  94% 91%  Weight:      Height:        Constitutional: Acute alert and oriented x3, patient is in mild respiratory distress. Skin: no rashes, no lesions, good skin turgor noted. Eyes: Pupils are equally reactive to light.  No evidence of scleral icterus or conjunctival pallor.  ENMT: Notable deformity of the mandible with deviation to the left.  Moist mucous membranes noted.  Posterior pharynx clear of any exudate or lesions.   Neck: normal, supple, no masses, no thyromegaly.  Somewhat increased jugular venous pulse noted at 30 degrees. Respiratory: Coarse breath sounds bilaterally with bibasilar and mid field rales.  Intermittent expiratory wheezing noted.  Normal respiratory effort. No accessory muscle use.  Cardiovascular: Regular rate and rhythm, no murmurs / rubs / gallops. No extremity edema. 2+ pedal pulses. No carotid bruits.  Chest:   Nontender without crepitus or deformity.   Back:   Nontender without crepitus or deformity. Abdomen: Abdomen is extremely protuberant but soft.  No evidence of intra-abdominal masses.  Positive bowel sounds noted in all quadrants.   Musculoskeletal: No joint deformity upper and lower extremities. Good ROM, no contractures. Normal muscle tone.  Neurologic: CN 2-12 grossly intact. Sensation intact, strength noted to be 5 out of 5 in all 4 extremities.  Patient is following all commands.  Patient is responsive to verbal stimuli.   Psychiatric: Patient presents as a normal mood with appropriate affect.  Patient seems to possess insight as to theircurrent situation.     Labs on Admission: I have personally reviewed following labs and imaging studies -   CBC: Recent Labs  Lab 03/21/20 1636  WBC 6.1  HGB 8.5*  HCT 27.5*  MCV 91.7  PLT 0000000   Basic Metabolic Panel: Recent  Labs  Lab 03/21/20 1636  NA 130*  K 4.8  CL 92*  CO2 27  GLUCOSE 132*  BUN 24*  CREATININE 1.11  CALCIUM 9.2   GFR: Estimated Creatinine Clearance: 68.9 mL/min (by C-G formula based on SCr of 1.11 mg/dL). Liver Function Tests: No results for input(s): AST, ALT, ALKPHOS, BILITOT, PROT, ALBUMIN in the last 168 hours. No results for input(s): LIPASE, AMYLASE in the last 168 hours. No results  for input(s): AMMONIA in the last 168 hours. Coagulation Profile: No results for input(s): INR, PROTIME in the last 168 hours. Cardiac Enzymes: No results for input(s): CKTOTAL, CKMB, CKMBINDEX, TROPONINI in the last 168 hours. BNP (last 3 results) No results for input(s): PROBNP in the last 8760 hours. HbA1C: No results for input(s): HGBA1C in the last 72 hours. CBG: No results for input(s): GLUCAP in the last 168 hours. Lipid Profile: No results for input(s): CHOL, HDL, LDLCALC, TRIG, CHOLHDL, LDLDIRECT in the last 72 hours. Thyroid Function Tests: No results for input(s): TSH, T4TOTAL, FREET4, T3FREE, THYROIDAB in the last 72 hours. Anemia Panel: No results for input(s): VITAMINB12, FOLATE, FERRITIN, TIBC, IRON, RETICCTPCT in the last 72 hours. Urine analysis:    Component Value Date/Time   COLORURINE YELLOW 02/17/2014 [place] 02/17/2014 1351   LABSPEC 1.008 02/17/2014 1351   PHURINE 6.0 02/17/2014 1351   GLUCOSEU NEGATIVE 02/17/2014 1351   HGBUR NEGATIVE 02/17/2014 1351   BILIRUBINUR NEGATIVE 02/17/2014 1351   KETONESUR NEGATIVE 02/17/2014 1351   PROTEINUR NEGATIVE 02/17/2014 1351   UROBILINOGEN 0.2 02/17/2014 1351   NITRITE NEGATIVE 02/17/2014 1351   LEUKOCYTESUR NEGATIVE 02/17/2014 1351    Radiological Exams on Admission - Personally Reviewed: DG Chest 2 View  Result Date: 03/21/2020 CLINICAL DATA:  Pneumonia EXAM: CHEST - 2 VIEW COMPARISON:  Chest CT January 05 2010 FINDINGS: There is airspace opacity in the right base with small right pleural effusion.  There is a minimal left pleural effusion with slight left base atelectasis. Heart size and pulmonary vascularity normal. No adenopathy. There is degenerative change in the thoracic spine. IMPRESSION: Small pleural effusion on each side, larger on the right than on the left. Ill-defined airspace opacity in the right base, likely combination of atelectasis and a degree of potential pneumonia. Slight left base atelectasis noted. Cardiac silhouette within normal limits.  No adenopathy. Electronically Signed   By: Lowella Grip III M.D.   On: 03/21/2020 17:30   CT Angio Chest PE W and/or Wo Contrast  Result Date: 03/21/2020 CLINICAL DATA:  74 year old male with shortness of breath. History of prostate cancer. EXAM: CT ANGIOGRAPHY CHEST WITH CONTRAST TECHNIQUE: Multidetector CT imaging of the chest was performed using the standard protocol during bolus administration of intravenous contrast. Multiplanar CT image reconstructions and MIPs were obtained to evaluate the vascular anatomy. CONTRAST:  114mL OMNIPAQUE IOHEXOL 350 MG/ML SOLN COMPARISON:  Chest radiograph dated 03/21/2020. FINDINGS: Cardiovascular: Top-normal cardiac size. No pericardial effusion. Advanced 3 vessel coronary vascular calcification. There is mild atherosclerotic calcification of the thoracic aorta. Evaluation of the aorta is limited due to non opacification and timing of the contrast. Evaluation of the pulmonary arteries is limited due to suboptimal opacification of the peripheral branches. No large or central pulmonary artery embolus identified. Mediastinum/Nodes: No hilar or mediastinal adenopathy. The esophagus is grossly unremarkable. No mediastinal fluid collection. Lungs/Pleura: Moderate right and small left pleural effusions with associated partial compressive atelectasis of the lower lobes. Pneumonia is not excluded. Clinical correlation is recommended. There is diffuse interstitial and interlobular septal prominence most consistent  with edema. There is no pneumothorax. The central airways are patent. Upper Abdomen: Partially visualized air pocket adjacent to the stomach (144/5) is indeterminate but may represent air within a segment of the colon. An extraluminal air is not entirely excluded. Musculoskeletal: Degenerative changes of the spine. Sclerotic changes of the T3 and T4 slightly progressed since the CT of 01/06/2020. no acute osseous pathology. Review of the MIP  images confirms the above findings. IMPRESSION: 1. No CT evidence of central pulmonary artery embolus. 2. Moderate right and small left pleural effusions with associated partial compressive atelectasis of the lower lobes. Pneumonia is not excluded. Clinical correlation is recommended. 3. Diffuse interstitial and interlobular septal prominence most consistent with edema. 4. Sclerotic changes of the T3 and T4 slightly progressed since the CT of 01/06/2020. 5. Partially visualized indeterminate air pocket adjacent to the stomach. This may represent an intraluminal pocket of air, although pneumoperitoneum is not excluded. 6. Aortic Atherosclerosis (ICD10-I70.0). Electronically Signed   By: Anner Crete M.D.   On: 03/21/2020 19:53     EKG: Personally reviewed.  Rhythm is normal sinus rhythm with heart rate of 90 bpm.  Notable ST segment depressions in the lateral leads.   Assessment/Plan Principal Problem:   Acute CHF (congestive heart failure) ([place])   Patient presenting with 1/2-week history of rapid weight gain, increasing abdominal girth, paroxysmal nocturnal dyspnea, pillow orthopnea, exertional dyspnea  Evidence of bilateral pleural effusions on chest x-ray with markedly elevated BNP of 3210, marked increase from a BNP of just over 1000-day ago in [facility] from records that family presented.  Patient reports the symptoms began shortly after being initiated on fludrocortisone proximately 2 weeks ago for orthostatic hypotension which has a side effect of  edema  Patient has likely had some degree of congestive heart failure all along but went into acute failure after being started on fludrocortisone which caused salt retention and progressive edema  While there was some concern for development of pneumonia and a recent outpatient urgent care clinic visit, in the absence of leukocytosis or fever and the fact the patient is already on chronic levofloxacin I do not believe that patient is truly suffering from this.  Will obtain CRP and blood cultures and initiate additional intravenous antibiotic therapy if patient clinically worsens despite diuresis.  Placing patient on 40 mg IV Lasix every 12  Obtaining echocardiogram in the morning  Supplemental oxygen and bronchodilator therapy for associated hypoxia.  Monitoring patient on telemetry  Considering markedly elevated troponins likely secondary to demand ischemia, additionally performing serial cardiac enzymes and serial EKGs.  No full dose anticoagulation needed this time as plaque rupture is unlikely.  Elevated troponins and EKG changes have been discussed with Dr. Madelon Lips with cardiology who agrees with this approach.  Fludrocortisone has been discontinued.  Active Problems:   Essential hypertension  Patient has been on numerous antihypertensives in the past which have all been discontinued in the past several months due to development of orthostatic hypotension.  Supportive care, providing with as needed intravenous antihypertensives for markedly elevated blood pressures only.    Seizure disorder ([place])   Continue home regimen of Lamictal    Mixed hyperlipidemia   Continue home regimen of statin therapy    Chronic osteomyelitis of mandible   Continue home regimen of chronic levofloxacin  Patient suffered from bleeding complications status post repair of mandible as recently as several weeks ago and therefore will abstain from Lovenox for DVT prophylaxis.    Orthostatic  hypotension   Longstanding diagnosis for at least 6 months  Patient is already had all of his antihypertensives discontinued has recently had fludrocortisone initiated which I feel is what accelerated the patient's acute congestive heart failure.  Discontinuing fludrocortisone  providing patient with compression stockings as a nonpharmacologic intervention point forward.  Otherwise, outpatient follow-up    Elevated troponin level not due myocardial infarction   Markedly elevated creatinine  initially of 616, followed by 569  Chest pain-free  Patient does exhibit some ST segment depression in the lateral leads  Troponin elevation and EKG findings have been discussed at length with Dr. De Nurse with cardiology.  Considering lack of chest pain and downtrending troponins, it is felt that these findings are secondary to supply demand mismatch/demand ischemia in the setting of acute congestive heart failure and that full dose anticoagulation is not indicated at this time  We will formally consult cardiology in the morning and revisit possibility of full dose anticoagulation if troponins rise  Monitoring patient on telemetry    GERD without esophagitis   Continue home regimen of PPI    Code Status:  Full code Family Communication: Wife is at the bedside has been updated on plan of care  Status is: Inpatient  Patient is inpatient status due to need for intravenous diuretics, supplemental oxygen, bronchodilator therapy, expert consultation, echocardiography, close clinical monitoring, serial cardiac enzymes.  Dispo: The patient is from: Home              Anticipated d/c is to: Home              Anticipated d/c date is: > 3 days              Patient currently is not medically stable to d/c.        Vernelle Emerald MD [facility] Pager [phone]  If 7PM-7AM, please contact night-coverage [email or web address] Use universal [place] password for that web site. If you  do not have the password, please call the hospital operator.  03/21/2020, 9:52 PM

## 2020-03-21 NOTE — ED Notes (Signed)
Pt placed on 2L [place]

## 2020-03-21 NOTE — ED Notes (Signed)
Dr notified of elevated troponin.

## 2020-03-21 NOTE — ED Triage Notes (Signed)
The pt has multiple complaints startting 2 weeks ago with jaw surgery and many bleeding episodes from that 2 week period  Since Thursday  When he was diagnosed in [place] with pneumonia  C/o dizziness and weakness loss of appetite  And he just does not feel good   Negative covid Thursday in [place]  He has had mederma vaccine x 2

## 2020-03-21 NOTE — ED Provider Notes (Addendum)
[place] EMERGENCY DEPARTMENT Provider Note   CSN: [id number] Arrival date & time: 03/21/20  1549     History Chief Complaint  Patient presents with  . Dizziness  . Pneumonia    Connor [name] is a 74 y.o. male with a past medical history significant for CKD, hyperlipidemia, hypertension, metastatic prostate cancer, seizure disorder, and history of CVA who presents to the ED due to multiple complaints.  Patient admits to a dry cough associated with shortness of breath with and without exertion for the past week. Patient admits that today he was showering and became so short of breath which caused him to report to the ED. He states he rested for 30 minutes which improved his symptoms. Admits to maybe some chest discomfort, but continuously denies chest pain. Patient notes he was evaluated on Thursday and diagnosed with pneumonia and prescribed an inhaler and levofloxacin.  He also notes he was negative for Covid then.  Patient notes worsening of symptoms.  Admits to associated chest pain only while coughing.  Denies fever and chills.  Denies sick contacts and known Covid exposures.  Patient has received both his Moderna Covid vaccines.  Patient also to persistent dizziness and generalized weakness for the past 6 months.  Patient notes dizziness is a feeling of off balance which typically occurs when he stands up. He notes he has been evaluated numerous times for this which was believed to be due to hypotension. He also admits to generalized weakness. Denies unilateral weakness, speech changes, and facial droop.   Patient also admits to numerous bleeding episodes from his incision site. 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 Patient denies any recent bleeding episodes. He just recently had a follow-up and  notes he is healing appropriately.     Past Medical History:  Diagnosis Date  . Arthritis    OA AND PAIN RT KNEE  . Cancer ([place])    h/o neck - ABOUT 6 YRS AGO - TX'D WITH RADIATION AND CHEMO   . Chronic kidney disease   . Head and neck cancer ~ 2009   S/P radiation & [name]  . Headache(784.0)   . Hyperlipidemia   . Hypertension   . Kidney carcinoma ([place])    h/o - NEPHRECTOMY   . Prostate cancer ([place]) 05/20/14   Gleason 4+3=7, volume 25 gm  . Radiation 2015   hx of, prostate cancer  . Seizures ([place])    hx of x yrs, "the bad kind;bite tongue; STATES LAST [place] 2013; WAS SEEING [name] - HE RETIRED AND PT LAST SAW DR. [name]  . Stroke ([place]) [place] 2013   UNABLE TO SPEAK OR MOVE AND RT SIDE WEAKNESS AND LOSS OF SKIN SENSITIVITY TO HEAT AND COLD ON RT SIDE, DOUBLE VISION. BALANCE PROBLEMS---STATES STILL HAS DOUBLE VISION AND BALANCE PROBLEM AND RT SIDED LOSS OF SKIN SENSITIVITY    Patient Active Problem List   Diagnosis Date Noted  . Chronic osteomyelitis of mandible 03/27/2018  . Pharyngeal dysphagia 03/27/2018  . Malignant neoplasm of prostate ([place]) 06/24/2014  . Elevated PSA 06/03/2014  . Hyponatremia 02/26/2014  . Postoperative anemia due to acute blood loss 02/26/2014  . OA (osteoarthritis) of knee 02/24/2014  . Dizziness 12/04/2011  . Hyperlipidemia LDL goal < 100 10/05/2011  .  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 CVA (cerebral infarction) 10/05/2011  . Stroke, acute, embolic ([place]) 0000000  . HTN (hypertension) 09/30/2011  . Seizure disorder ([place]) 09/30/2011    Past Surgical History:  Procedure Laterality Date  . hydrocelectomy  11/2000   left  . IR FLUORO GUIDE CV LINE RIGHT  08/31/2017  . IR US GUIDE VASC ACCESS RIGHT  08/31/2017  . JOINT REPLACEMENT     LEFT TOTAL KNEE ARTHROPLASTY  . MOUTH SURGERY  2019  . NEPHRECTOMY  1990's   left  . PROSTATE BIOPSY  05/20/14   Gleason 4+3=7, vol 25 gm  . TEE WITHOUT CARDIOVERSION   10/04/2011   Procedure: TRANSESOPHAGEAL ECHOCARDIOGRAM (TEE);  Surgeon: Candee Furbish, MD;  Location: [facility] ENDOSCOPY;  Service: Cardiovascular;  Laterality: N/A;  . TOTAL KNEE ARTHROPLASTY  2011   left  . TOTAL KNEE ARTHROPLASTY Right 02/24/2014   Procedure: RIGHT TOTAL KNEE ARTHROPLASTY;  Surgeon: Gearlean Alf, MD;  Location: [facility] ORS;  Service: Orthopedics;  Laterality: Right;       Family History  Problem Relation Age of Onset  . Diabetes Mother   . Heart disease Mother   . Heart disease Father     Social History   Tobacco Use  . Smoking status: Never Smoker  . Smokeless tobacco: Never Used  Substance Use Topics  . Alcohol use: Yes    Alcohol/week: 0.0 standard drinks    Comment: WAS 6 TO 8 BEERS DAILY   . Drug use: No    Home Medications Prior to Admission medications   Medication Sig Start Date End Date Taking? Authorizing Provider  amLODipine (NORVASC) 10 MG tablet  02/16/18   [provider]  amLODipine-valsartan (EXFORGE) 5-160 MG tablet TK 1 T PO QD 06/18/19   [provider]  amoxicillin (AMOXIL) 500 MG capsule Take 1 capsule (500 mg total) by mouth 3 (three) times daily. Patient not taking: Reported on 08/26/2019 07/11/19   Carlyle Basques, MD  aspirin 81 MG tablet Take 81 mg by mouth daily.    [provider]  Aspirin-Calcium Carbonate 81-777 MG TABS Take by mouth.    [provider]  bicalutamide (CASODEX) 50 MG tablet TK 1 T PO D 02/16/18   [provider]  chlorhexidine (PERIDEX) 0.12 % solution SWISH 15 ML PO FOR 30 SECONDS THEN SPIT BID 02/23/18   [provider]  fluconazole (DIFLUCAN) 200 MG tablet Take 2 tablets (400 mg total) by mouth daily. 10/31/19   Carlyle Basques, MD  hydrochlorothiazide (HYDRODIURIL) 12.5 MG tablet Take 1 tablet (12.5 mg total) by mouth daily. 01/23/18   Carlyle Basques, MD  lamoTRIgine (LAMICTAL) 100 MG tablet 100mg  in AM and 200mg  in PM 02/26/19   Penumalli, Earlean Polka, MD  levofloxacin  (LEVAQUIN) 500 MG tablet Take 1 tablet (500 mg total) by mouth daily. 03/03/20   Carlyle Basques, MD  NONFORMULARY OR COMPOUNDED ITEM Hormone injection from Cancer MD every 30 days.    [provider]  omeprazole (PRILOSEC) 40 MG capsule  06/19/19   [provider]  penicillin v potassium (VEETID) 500 MG tablet Take 500 mg by mouth 4 (four) times daily. 08/03/19   [provider]  simvastatin (ZOCOR) 20 MG tablet Take 20 mg by mouth every evening.    [provider]  UNKNOWN TO PATIENT Hormone injection from Cancer MD every 30 days.    [provider]  valsartan (DIOVAN) 160 MG tablet Take 1 tablet (160 mg total) by mouth daily. 12/25/17  Carlyle Basques, MD    Allergies    Morphine and related  Review of Systems   Review of Systems  Constitutional: Negative for chills and fever.  Respiratory: Positive for cough and shortness of breath.   Cardiovascular: Positive for chest pain (only while coughing).  Gastrointestinal: Negative for abdominal pain, diarrhea, nausea and vomiting.  Neurological: Positive for dizziness (chronic) and weakness (generalized weakness).  All other systems reviewed and are negative.   Physical Exam Updated Vital Signs BP 126/79   Pulse 91   Temp 98.3 F (36.8 C) (Oral)   Resp 20   Ht 5\' 10"  (1.778 m)   Wt 93 kg   SpO2 94%   BMI 29.42 kg/m   Physical Exam Vitals and nursing note reviewed.  Constitutional:      General: He is not in acute distress.    Appearance: He is not toxic-appearing.  HENT:     Head: Normocephalic.     Mouth/Throat:     Comments: Well healing surgical incision on left mandible. No signs of infection.  Eyes:     Pupils: Pupils are equal, round, and reactive to light.  Neck:     Comments: No meningismus. Cardiovascular:     Rate and Rhythm: Normal rate and regular rhythm.     Pulses: Normal pulses.     Heart sounds: Normal heart sounds. No murmur. No friction rub. No gallop.    Pulmonary:     Effort: Pulmonary effort is normal.     Breath sounds: Wheezing present.     Comments: A wheeze heard throughout.  Decreased air movement at the base of the lungs. Abdominal:     General: Abdomen is flat. Bowel sounds are normal. There is no distension.     Palpations: Abdomen is soft.     Tenderness: There is no abdominal tenderness. There is no guarding or rebound.  Musculoskeletal:     Cervical back: Neck supple.     Comments: Able to move all 4 extremities without difficulty.   Skin:    General: Skin is warm and dry.  Neurological:     General: No focal deficit present.     Mental Status: He is alert.     Comments: Speech is clear, able to follow commands CN III-XII intact Normal strength in upper and lower extremities bilaterally including dorsiflexion and plantar flexion, strong and equal grip strength Sensation grossly intact throughout Moves extremities without ataxia, coordination intact No pronator drift   Psychiatric:        Mood and Affect: Mood normal.        Behavior: Behavior normal.     ED Results / Procedures / Treatments   Labs (all labs ordered are listed, but only abnormal results are displayed) Labs Reviewed  BASIC METABOLIC PANEL - Abnormal; Notable for the following components:      Result Value   Sodium 130 (*)    Chloride 92 (*)    Glucose, Bld 132 (*)    BUN 24 (*)    All other components within normal limits  CBC - Abnormal; Notable for the following components:   RBC 3.00 (*)    Hemoglobin 8.5 (*)    HCT 27.5 (*)    All other components within normal limits  BRAIN NATRIURETIC PEPTIDE - Abnormal; Notable for the following components:   B Natriuretic Peptide 3,210.4 (*)    All other components within normal limits  TROPONIN I (HIGH SENSITIVITY) - Abnormal; Notable for the following components:  Troponin I (High Sensitivity) 616 (*)    All other components within normal limits  TROPONIN I (HIGH SENSITIVITY) - Abnormal;  Notable for the following components:   Troponin I (High Sensitivity) 569 (*)    All other components within normal limits  SARS CORONAVIRUS 2 BY RT PCR [facility] ORDER, [place] LAB)    EKG EKG Interpretation  Date/Time:  Saturday Mar 21 2020 16:12:56 EDT Ventricular Rate:  90 PR Interval:  186 QRS Duration: 118 QT Interval:  380 QTC Calculation: 464 R Axis:   52 Text Interpretation: Normal sinus rhythm Septal infarct , age undetermined Abnormal ECG widened qrs Otherwise no significant change Confirmed by Deno Etienne [phone]) on 03/21/2020 6:38:22 PM   Radiology DG Chest 2 View  Result Date: 03/21/2020 CLINICAL DATA:  Pneumonia EXAM: CHEST - 2 VIEW COMPARISON:  Chest CT January 05 2010 FINDINGS: There is airspace opacity in the right base with small right pleural effusion. There is a minimal left pleural effusion with slight left base atelectasis. Heart size and pulmonary vascularity normal. No adenopathy. There is degenerative change in the thoracic spine. IMPRESSION: Small pleural effusion on each side, larger on the right than on the left. Ill-defined airspace opacity in the right base, likely combination of atelectasis and a degree of potential pneumonia. Slight left base atelectasis noted. Cardiac silhouette within normal limits.  No adenopathy. Electronically Signed   By: Lowella Grip III M.D.   On: 03/21/2020 17:30   CT Angio Chest PE W and/or Wo Contrast  Result Date: 03/21/2020 CLINICAL DATA:  74 year old male with shortness of breath. History of prostate cancer. EXAM: CT ANGIOGRAPHY CHEST WITH CONTRAST TECHNIQUE: Multidetector CT imaging of the chest was performed using the standard protocol during bolus administration of intravenous contrast. Multiplanar CT image reconstructions and MIPs were obtained to evaluate the vascular anatomy. CONTRAST:  165mL OMNIPAQUE IOHEXOL 350 MG/ML SOLN COMPARISON:  Chest radiograph dated 03/21/2020. FINDINGS:  Cardiovascular: Top-normal cardiac size. No pericardial effusion. Advanced 3 vessel coronary vascular calcification. There is mild atherosclerotic calcification of the thoracic aorta. Evaluation of the aorta is limited due to non opacification and timing of the contrast. Evaluation of the pulmonary arteries is limited due to suboptimal opacification of the peripheral branches. No large or central pulmonary artery embolus identified. Mediastinum/Nodes: No hilar or mediastinal adenopathy. The esophagus is grossly unremarkable. No mediastinal fluid collection. Lungs/Pleura: Moderate right and small left pleural effusions with associated partial compressive atelectasis of the lower lobes. Pneumonia is not excluded. Clinical correlation is recommended. There is diffuse interstitial and interlobular septal prominence most consistent with edema. There is no pneumothorax. The central airways are patent. Upper Abdomen: Partially visualized air pocket adjacent to the stomach (144/5) is indeterminate but may represent air within a segment of the colon. An extraluminal air is not entirely excluded. Musculoskeletal: Degenerative changes of the spine. Sclerotic changes of the T3 and T4 slightly progressed since the CT of 01/06/2020. no acute osseous pathology. Review of the MIP images confirms the above findings. IMPRESSION: 1. No CT evidence of central pulmonary artery embolus. 2. Moderate right and small left pleural effusions with associated partial compressive atelectasis of the lower lobes. Pneumonia is not excluded. Clinical correlation is recommended. 3. Diffuse interstitial and interlobular septal prominence most consistent with edema. 4. Sclerotic changes of the T3 and T4 slightly progressed since the CT of 01/06/2020. 5. Partially visualized indeterminate air pocket adjacent to the stomach. This may represent an intraluminal pocket of air, although pneumoperitoneum  is not excluded. 6. Aortic Atherosclerosis  (ICD10-I70.0). Electronically Signed   By: Anner Crete M.D.   On: 03/21/2020 19:53    Procedures .Critical Care Performed by: Suzy Bouchard, PA-C Authorized by: Suzy Bouchard, PA-C   Critical care provider statement:    Critical care time (minutes):  45   Critical care was necessary to treat or prevent imminent or life-threatening deterioration of the following conditions:  Cardiac failure   Critical care was time spent personally by me on the following activities:  Discussions with consultants, evaluation of patient's response to treatment, examination of patient, ordering and performing treatments and interventions, ordering and review of laboratory studies, ordering and review of radiographic studies, pulse oximetry, re-evaluation of patient's condition, obtaining history from patient or surrogate and review of old charts   I assumed direction of critical care for this patient from another provider in my specialty: no     (including critical care time)  Medications Ordered in ED Medications  sodium chloride flush (NS) 0.9 % injection 3 mL (has no administration in time range)  iohexol (OMNIPAQUE) 350 MG/ML injection 100 mL (100 mLs Intravenous Contrast Given 03/21/20 1929)    ED Course  I have reviewed the triage vital signs and the nursing notes.  Pertinent labs & imaging results that were available during my care of the patient were reviewed by me and considered in my medical decision making (see chart for details).  Clinical Course as of Mar 22 2043  Sat Mar 21, 2020  [id number] Troponin I (High Sensitivity)(!!): 616 [CA]  1823 Hemoglobin(!): 8.5 [CA]  1823 Troponin I (High Sensitivity)(!!): 616 [CA]  1905 Discussed case with Dr. Paticia Stack with cardiology who recommends trending troponin, serial EKGs, BNP, and CTA to rule out PE. Will reconsult if troponin more elevated.    [CA]  2005 B Natriuretic Peptide(!): 3,210.4 [CA]  2017 Troponin I (High Sensitivity)(!!): 569  [CA]  2041 Discussed case with Dr. Cyd Silence who agrees to admit patient for further evaluation.   [CA]  2042 SARS Coronavirus 2: NEGATIVE [CA]    Clinical Course User Index [CA] Suzy Bouchard, PA-C   MDM Rules/Calculators/A&P                     74 year old male presents to the ED due to numerous complaints of dizziness and generalized weakness has been present for the past 6 months.  He also admits to a dry cough and shortness of breath for the past week. He was just recently diagnosed with PNA and is currently being treated with an albuterol inhaler and levofloxacin.  Vitals all within normal limits.  Patient in no acute distress and nontoxic-appearing.  Routine labs and troponin ordered at triage. Will obtain CTA to rule out PE given sudden onset of shortness of breath.   Initial troponin elevated at 616.  Will consult cardiology for recommendations.  CBC significant for hemoglobin at 8.5 which appears slightly lower than baseline. (9.8 3 weeks ago).  BMP significant for hyponatremia at 130 with elevated BUN at 24, but otherwise reassuring.  Chest x-ray personally reviewed which demonstrates: IMPRESSION:  Small pleural effusion on each side, larger on the right than on the  left. Ill-defined airspace opacity in the right base, likely  combination of atelectasis and a degree of potential pneumonia.  Slight left base atelectasis noted.    Cardiac silhouette within normal limits. No adenopathy.   CTA personally reviewed which demonstrates: IMPRESSION:  1. No CT evidence  of central pulmonary artery embolus.  2. Moderate right and small left pleural effusions with associated  partial compressive atelectasis of the lower lobes. Pneumonia is not  excluded. Clinical correlation is recommended.  3. Diffuse interstitial and interlobular septal prominence most  consistent with edema.  4. Sclerotic changes of the T3 and T4 slightly progressed since the  CT of 01/06/2020.  5. Partially  visualized indeterminate air pocket adjacent to the  stomach. This may represent an intraluminal pocket of air, although  pneumoperitoneum is not excluded.  6. Aortic Atherosclerosis (ICD10-I70.0).   BNP elevated at 3210. 2nd troponin downtrending at 569. Will consult hospitalist for medical admission to trend troponins.  Discussed case with Dr. Cyd Silence with [facility] who agrees to admit patient for further treatment. COVID negative.   Discussed case with Dr. Tyrone Nine who evaluated the patient at bedside and agrees with assessment and plan.  Final Clinical Impression(s) / ED Diagnoses Final diagnoses:  Dyspnea, unspecified type    Rx / DC Orders ED Discharge Orders    None       Karie Kirks 03/21/20 2046    Deno Etienne, DO 03/21/20 2055    Suzy Bouchard, PA-C 03/21/20 2057    Deno Etienne, DO 03/21/20 2059

## 2020-03-22 ENCOUNTER — Inpatient Hospital Stay (HOSPITAL_COMMUNITY): Payer: PPO

## 2020-03-22 ENCOUNTER — Other Ambulatory Visit: Payer: Self-pay

## 2020-03-22 DIAGNOSIS — I5021 Acute systolic (congestive) heart failure: Secondary | ICD-10-CM

## 2020-03-22 LAB — CBC WITH DIFFERENTIAL/PLATELET
Abs Immature Granulocytes: 0.01 10*3/uL (ref 0.00–0.07)
Basophils Absolute: 0 10*3/uL (ref 0.0–0.1)
Basophils Relative: 1 %
Eosinophils Absolute: 0.2 10*3/uL (ref 0.0–0.5)
Eosinophils Relative: 3 %
HCT: 26.6 % — ABNORMAL LOW (ref 39.0–52.0)
Hemoglobin: 8.4 g/dL — ABNORMAL LOW (ref 13.0–17.0)
Immature Granulocytes: 0 %
Lymphocytes Relative: 17 %
Lymphs Abs: 1.1 10*3/uL (ref 0.7–4.0)
MCH: 28.9 pg (ref 26.0–34.0)
MCHC: 31.6 g/dL (ref 30.0–36.0)
MCV: 91.4 fL (ref 80.0–100.0)
Monocytes Absolute: 1 10*3/uL (ref 0.1–1.0)
Monocytes Relative: 16 %
Neutro Abs: 3.9 10*3/uL (ref 1.7–7.7)
Neutrophils Relative %: 63 %
Platelets: 275 10*3/uL (ref 150–400)
RBC: 2.91 MIL/uL — ABNORMAL LOW (ref 4.22–5.81)
RDW: 14.7 % (ref 11.5–15.5)
WBC: 6.1 10*3/uL (ref 4.0–10.5)
nRBC: 0 % (ref 0.0–0.2)

## 2020-03-22 LAB — COMPREHENSIVE METABOLIC PANEL
ALT: 12 U/L (ref 0–44)
AST: 19 U/L (ref 15–41)
Albumin: 3.2 g/dL — ABNORMAL LOW (ref 3.5–5.0)
Alkaline Phosphatase: 78 U/L (ref 38–126)
Anion gap: 10 (ref 5–15)
BUN: 19 mg/dL (ref 8–23)
CO2: 29 mmol/L (ref 22–32)
Calcium: 9.1 mg/dL (ref 8.9–10.3)
Chloride: 94 mmol/L — ABNORMAL LOW (ref 98–111)
Creatinine, Ser: 1.17 mg/dL (ref 0.61–1.24)
GFR calc Af Amer: >60 mL/min (ref >60)
GFR calc non Af Amer: >60 mL/min (ref >60)
Glucose, Bld: 114 mg/dL — ABNORMAL HIGH (ref 70–99)
Potassium: 4.4 mmol/L (ref 3.5–5.1)
Sodium: 133 mmol/L — ABNORMAL LOW (ref 135–145)
Total Bilirubin: 0.6 mg/dL (ref 0.3–1.2)
Total Protein: 6.1 g/dL — ABNORMAL LOW (ref 6.5–8.1)

## 2020-03-22 LAB — IRON AND TIBC
Iron: 16 ug/dL — ABNORMAL LOW (ref 45–182)
Saturation Ratios: 4 % — ABNORMAL LOW (ref 17.9–39.5)
TIBC: 444 ug/dL (ref 250–450)
UIBC: 428 ug/dL

## 2020-03-22 LAB — FOLATE: Folate: 28.1 ng/mL (ref >5.9)

## 2020-03-22 LAB — ECHOCARDIOGRAM COMPLETE
Height: 70 in
Weight: 3280.44 oz

## 2020-03-22 LAB — VITAMIN B12: Vitamin B-12: 747 pg/mL (ref 180–914)

## 2020-03-22 LAB — TROPONIN I (HIGH SENSITIVITY)
Troponin I (High Sensitivity): 613 ng/L (ref <18)
Troponin I (High Sensitivity): 620 ng/L (ref <18)

## 2020-03-22 LAB — MAGNESIUM: Magnesium: 1.9 mg/dL (ref 1.7–2.4)

## 2020-03-22 LAB — C-REACTIVE PROTEIN: CRP: 0.7 mg/dL (ref <1.0)

## 2020-03-22 MED ORDER — BOOST / RESOURCE BREEZE PO LIQD CUSTOM
1.0000 | Freq: Three times a day (TID) | ORAL | Status: DC
  Administered 2020-03-22 – 2020-03-23 (×3): 1 via ORAL
  Filled 2020-03-22: qty 1

## 2020-03-22 NOTE — Plan of Care (Signed)
  Problem: Education: Goal: Ability to demonstrate management of disease process will improve Outcome: Progressing   Problem: Education: Goal: Ability to verbalize understanding of medication therapies will improve Outcome: Progressing   Problem: Education: Goal: Knowledge of General Education information will improve Description: Including pain rating scale, medication(s)/side effects and non-pharmacologic comfort measures Outcome: Progressing   Problem: Cardiac: Goal: Ability to achieve and maintain adequate cardiopulmonary perfusion will improve Outcome: Progressing

## 2020-03-22 NOTE — ED Notes (Signed)
Tele  Breakfast Ordered 

## 2020-03-22 NOTE — ED Notes (Signed)
Lunch Tray Ordered @ 1104.  

## 2020-03-22 NOTE — Progress Notes (Signed)
*  PRELIMINARY RESULTS* Echocardiogram 2D Echocardiogram has been performed.  Leavy Cella 03/22/2020, 1:11 PM

## 2020-03-22 NOTE — Progress Notes (Addendum)
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 -Patient has a long history of orthostatic hypotension and dizziness, this persisted despite titrating down his antihypertensives, subsequently he was started on fludrocortisone few weeks ago. Subsequently patient reports 10 pound weight gain, dyspnea on exertion, increasing abdominal girth, orthopnea and PND, cough, had a negative Covid test and was seen in [place] urgent care on 5/29 and diagnosed with possible pneumonia and prescribed Levaquin without knowing the patient is already on chronic Levaquin therapy. -Patient symptoms started worsening and eventually presented to [facility] ED, BNP was noted to be 3200, high-sensitivity troponin in the 5-600 range, CT chest noted bilateral pleural effusions, case was discussed with cardiology Dr. De Nurse recommended serial cardiac enzymes, monitoring on telemetry and obtaining an echocardiogram  Assessment & Plan:   Acute CHF (congestive heart failure) (HCC) -Fluid overloaded,, imaging consistent with bilateral pleural effusions, BNP 3210 -Continue IV Lasix today, monitor electrolytes -Follow-up 2D echocardiogram -Stop fludrocortisone which can contribute to fluid retention -Diurese as tolerated by dizziness, orthostatic hypotension -Monitor I's/O, daily weights -BMP in a.m.  Elevated high-sensitivity troponin -Flat  trend, 500-600 range, patient denies any chest pain in the last few weeks with activity or rest -Suspect this is demand ischemia in the setting of fluid overload and heart failure -Case was discussed with cardiology overnight recommended echocardiogram -Follow-up 2D echo, monitor on telemetry  History of seizure disorder -Continue home regimen of Lamictal  Chronic osteomyelitis of the mandible -Recent surgery at [facility], followed by bleeding complications -Avoid DVT prophylaxis -Followed by infectious disease, continue chronic levofloxacin  Orthostatic hypotension -Chronic at least ongoing for 6 to 8 months -Off all antihypertensives, fludrocortisone discontinued in the setting of fluid overload -Compression stockings/TED hose -Consider low-dose midodrine  History of metastatic prostate cancer History of squamous cell carcinoma of left tonsil status post XRT and surgeries -Follow-up with oncology, oral surgery at [place] Status:   DNR Family Communication:  Friend at bedside  Status is: Inpatient  Patient is inpatient status due to need for intravenous diuretics, supplemental oxygen, bronchodilator therapy, expert consultation, echocardiography, close clinical monitoring, serial cardiac enzymes.  Dispo: The patient is from: Home  Anticipated d/c is to: Home  Anticipated d/c date is: > 3 days  Patient currently is not medically stable to d/c.   Procedures:   Antimicrobials:    Subjective: -Feels better this morning, shortness of breath is starting to improve, denies any chest pain or dizziness  Objective: Vitals:   03/22/20 0915 03/22/20 0930 03/22/20 0945 03/22/20 0956  BP:    108/72  Pulse: 81 75 76 76  Resp: 19 12 16 17   Temp:    97.6 F (36.4 C)  TempSrc:    Oral  SpO2:    97%  Weight:      Height:        Intake/Output Summary (Last 24 hours) at 03/22/2020 1054 Last data filed at 03/22/2020  0650 Gross per 24 hour  Intake --  Output 4100  ml  Net -4100 ml   Filed Weights   03/21/20 1623 03/21/20 1655  Weight: 93 kg 93 kg    Examination:  General exam: Chronically ill elderly male sitting up in bed, AAOx3 HEENT: Deformities of left mandible postsurgical CVS S1-S2, regular rhythm Lungs with bilateral lower lobe crackles Abdomen is soft, nontender, bowel sounds present Extremities with no edema  Skin: Discoloration of the skin submandibular region Psychiatry:  Mood & affect appropriate.     Data Reviewed:   CBC: Recent Labs  Lab 03/21/20 1636 03/22/20 0403  WBC 6.1 6.1  NEUTROABS  --  3.9  HGB 8.5* 8.4*  HCT 27.5* 26.6*  MCV 91.7 91.4  PLT 318 [id number]   Basic Metabolic Panel: Recent Labs  Lab 03/21/20 1636 03/22/20 0403  NA 130* 133*  K 4.8 4.4  CL 92* 94*  CO2 27 29  GLUCOSE 132* 114*  BUN 24* 19  CREATININE 1.11 1.17  CALCIUM 9.2 9.1  MG  --  1.9   GFR: Estimated Creatinine Clearance: 65.4 mL/min (by C-G formula based on SCr of 1.17 mg/dL). Liver Function Tests: Recent Labs  Lab 03/21/20 1730 03/22/20 0403  AST 21 19  ALT 13 12  ALKPHOS 84 78  BILITOT 0.4 0.6  PROT 6.8 6.1*  ALBUMIN 3.6 3.2*   No results for input(s): LIPASE, AMYLASE in the last 168 hours. No results for input(s): AMMONIA in the last 168 hours. Coagulation Profile: No results for input(s): INR, PROTIME in the last 168 hours. Cardiac Enzymes: No results for input(s): CKTOTAL, CKMB, CKMBINDEX, TROPONINI in the last 168 hours. BNP (last 3 results) No results for input(s): PROBNP in the last 8760 hours. HbA1C: No results for input(s): HGBA1C in the last 72 hours. CBG: No results for input(s): GLUCAP in the last 168 hours. Lipid Profile: No results for input(s): CHOL, HDL, LDLCALC, TRIG, CHOLHDL, LDLDIRECT in the last 72 hours. Thyroid Function Tests: No results for input(s): TSH, T4TOTAL, FREET4, T3FREE, THYROIDAB in the last 72 hours. Anemia Panel: Recent Labs     03/21/20 2200  VITAMINB12 747  FOLATE 28.1  TIBC 444  IRON 16*   Urine analysis:    Component Value Date/Time   COLORURINE YELLOW 03/21/2020 2205   APPEARANCEUR CLEAR 03/21/2020 2205   LABSPEC 1.015 03/21/2020 2205   PHURINE 7.0 03/21/2020 2205   GLUCOSEU NEGATIVE 03/21/2020 2205   HGBUR NEGATIVE 03/21/2020 [place] 03/21/2020 2205   [place] 03/21/2020 2205   PROTEINUR 30 (A) 03/21/2020 2205   UROBILINOGEN 0.2 02/17/2014 1351   NITRITE NEGATIVE 03/21/2020 2205   LEUKOCYTESUR NEGATIVE 03/21/2020 2205   Sepsis Labs: @LABRCNTIP (procalcitonin:4,lacticidven:4)  ) Recent Results (from the past 240 hour(s))  SARS Coronavirus 2 by RT PCR (hospital order, performed in [place] hospital lab) Nasopharyngeal Nasopharyngeal Swab     Status: None   Collection Time: 03/21/20  7:08 PM   Specimen: Nasopharyngeal Swab  Result Value Ref Range Status   SARS Coronavirus 2 NEGATIVE NEGATIVE Final    Comment: (NOTE) SARS-CoV-2 target nucleic acids are NOT DETECTED. The SARS-CoV-2 RNA is generally detectable in upper and lower respiratory specimens during the acute phase of infection. The lowest concentration of SARS-CoV-2 viral copies this assay can detect is 250 copies / mL. A negative result does not preclude SARS-CoV-2 infection and should not be used as the sole basis for treatment or other patient management decisions.  A negative result may occur with improper specimen collection / handling, submission of specimen  other than nasopharyngeal swab, presence of viral mutation(s) within the areas targeted by this assay, and inadequate number of viral copies (<250 copies / mL). A negative result must be combined with clinical observations, patient history, and epidemiological information. Fact Sheet for Patients:   StrictlyIdeas.no Fact Sheet for Healthcare Providers: BankingDealers.co.za This test is not yet  approved or cleared  by the [place] FDA and has been authorized for detection and/or diagnosis of SARS-CoV-2 by FDA under an Emergency Use Authorization (EUA).  This EUA will remain in effect (meaning this test can be used) for the duration of the COVID-19 declaration under Section 564(b)(1) of the Act, 21 U.S.C. section 360bbb-3(b)(1), unless the authorization is terminated or revoked sooner. Performed at [facility], [place] [address], [place], [place] [address]   Culture, blood (routine x 2)     Status: None (Preliminary result)   Collection Time: 03/21/20 10:15 PM   Specimen: BLOOD RIGHT HAND  Result Value Ref Range Status   Specimen Description BLOOD RIGHT HAND  Final   Special Requests   Final    BOTTLES DRAWN AEROBIC AND ANAEROBIC Blood Culture results may not be optimal due to an inadequate volume of blood received in culture bottles   Culture   Final    NO GROWTH < 12 HOURS Performed at [facility], [place] [address], [place], [place] [address]    Report Status PENDING  Incomplete  Culture, blood (routine x 2)     Status: None (Preliminary result)   Collection Time: 03/21/20 10:21 PM   Specimen: BLOOD  Result Value Ref Range Status   Specimen Description BLOOD RIGHT ARM  Final   Special Requests   Final    BOTTLES DRAWN AEROBIC AND ANAEROBIC Blood Culture results may not be optimal due to an inadequate volume of blood received in culture bottles   Culture   Final    NO GROWTH < 12 HOURS Performed at [facility], [place] [address] [place], [place] [address]    Report Status PENDING  Incomplete         Radiology Studies: DG Chest 2 View  Result Date: 03/21/2020 CLINICAL DATA:  Pneumonia EXAM: CHEST - 2 VIEW COMPARISON:  Chest CT January 05 2010 FINDINGS: There is airspace opacity in the right base with small right pleural effusion. There is a minimal left pleural effusion with slight left base atelectasis. Heart size and pulmonary vascularity normal.  No adenopathy. There is degenerative change in the thoracic spine. IMPRESSION: Small pleural effusion on each side, larger on the right than on the left. Ill-defined airspace opacity in the right base, likely combination of atelectasis and a degree of potential pneumonia. Slight left base atelectasis noted. Cardiac silhouette within normal limits.  No adenopathy. Electronically Signed   By: Lowella Grip III M.D.   On: 03/21/2020 17:30   CT Angio Chest PE W and/or Wo Contrast  Result Date: 03/21/2020 CLINICAL DATA:  74 year old male with shortness of breath. History of prostate cancer. EXAM: CT ANGIOGRAPHY CHEST WITH CONTRAST TECHNIQUE: Multidetector CT imaging of the chest was performed using the standard protocol during bolus administration of intravenous contrast. Multiplanar CT image reconstructions and MIPs were obtained to evaluate the vascular anatomy. CONTRAST:  142mL OMNIPAQUE IOHEXOL 350 MG/ML SOLN COMPARISON:  Chest radiograph dated 03/21/2020. FINDINGS: Cardiovascular: Top-normal cardiac size. No pericardial effusion. Advanced 3 vessel coronary vascular calcification. There is mild atherosclerotic calcification of the thoracic aorta. Evaluation of the aorta is limited  due to non opacification and timing of the contrast. Evaluation of the pulmonary arteries is limited due to suboptimal opacification of the peripheral branches. No large or central pulmonary artery embolus identified. Mediastinum/Nodes: No hilar or mediastinal adenopathy. The esophagus is grossly unremarkable. No mediastinal fluid collection. Lungs/Pleura: Moderate right and small left pleural effusions with associated partial compressive atelectasis of the lower lobes. Pneumonia is not excluded. Clinical correlation is recommended. There is diffuse interstitial and interlobular septal prominence most consistent with edema. There is no pneumothorax. The central airways are patent. Upper Abdomen: Partially visualized air pocket  adjacent to the stomach (144/5) is indeterminate but may represent air within a segment of the colon. An extraluminal air is not entirely excluded. Musculoskeletal: Degenerative changes of the spine. Sclerotic changes of the T3 and T4 slightly progressed since the CT of 01/06/2020. no acute osseous pathology. Review of the MIP images confirms the above findings. IMPRESSION: 1. No CT evidence of central pulmonary artery embolus. 2. Moderate right and small left pleural effusions with associated partial compressive atelectasis of the lower lobes. Pneumonia is not excluded. Clinical correlation is recommended. 3. Diffuse interstitial and interlobular septal prominence most consistent with edema. 4. Sclerotic changes of the T3 and T4 slightly progressed since the CT of 01/06/2020. 5. Partially visualized indeterminate air pocket adjacent to the stomach. This may represent an intraluminal pocket of air, although pneumoperitoneum is not excluded. 6. Aortic Atherosclerosis (ICD10-I70.0). Electronically Signed   By: Anner Crete M.D.   On: 03/21/2020 19:53   Scheduled Meds: . aspirin  81 mg Oral Daily  . chlorhexidine  15 mL Mouth Rinse BID  . feeding supplement  1 Container Oral TID BM  . furosemide  40 mg Intravenous Q12H  . lamoTRIgine  100 mg Oral q AM  . lamoTRIgine  200 mg Oral QPM  . levofloxacin  500 mg Oral Daily  . pantoprazole  40 mg Oral Daily  . simvastatin  20 mg Oral QPM  . sodium chloride flush  3 mL Intravenous Q12H   Continuous Infusions: . sodium chloride       LOS: 1 day    Time spent: 20min  Domenic Polite, MD [facility] 03/22/2020, 10:54 AM

## 2020-03-23 DIAGNOSIS — E782 Mixed hyperlipidemia: Secondary | ICD-10-CM

## 2020-03-23 DIAGNOSIS — I5041 Acute combined systolic (congestive) and diastolic (congestive) heart failure: Secondary | ICD-10-CM

## 2020-03-23 DIAGNOSIS — R778 Other specified abnormalities of plasma proteins: Secondary | ICD-10-CM

## 2020-03-23 DIAGNOSIS — I10 Essential (primary) hypertension: Secondary | ICD-10-CM

## 2020-03-23 DIAGNOSIS — I951 Orthostatic hypotension: Secondary | ICD-10-CM

## 2020-03-23 LAB — CBC
HCT: 27.4 % — ABNORMAL LOW (ref 39.0–52.0)
Hemoglobin: 8.6 g/dL — ABNORMAL LOW (ref 13.0–17.0)
MCH: 28.6 pg (ref 26.0–34.0)
MCHC: 31.4 g/dL (ref 30.0–36.0)
MCV: 91 fL (ref 80.0–100.0)
Platelets: 287 10*3/uL (ref 150–400)
RBC: 3.01 MIL/uL — ABNORMAL LOW (ref 4.22–5.81)
RDW: 14.4 % (ref 11.5–15.5)
WBC: 7 10*3/uL (ref 4.0–10.5)
nRBC: 0 % (ref 0.0–0.2)

## 2020-03-23 LAB — COMPREHENSIVE METABOLIC PANEL
ALT: 11 U/L (ref 0–44)
AST: 17 U/L (ref 15–41)
Albumin: 3.1 g/dL — ABNORMAL LOW (ref 3.5–5.0)
Alkaline Phosphatase: 75 U/L (ref 38–126)
Anion gap: 11 (ref 5–15)
BUN: 21 mg/dL (ref 8–23)
CO2: 32 mmol/L (ref 22–32)
Calcium: 9.2 mg/dL (ref 8.9–10.3)
Chloride: 92 mmol/L — ABNORMAL LOW (ref 98–111)
Creatinine, Ser: 1.31 mg/dL — ABNORMAL HIGH (ref 0.61–1.24)
GFR calc Af Amer: >60 mL/min (ref >60)
GFR calc non Af Amer: 54 mL/min — ABNORMAL LOW (ref >60)
Glucose, Bld: 103 mg/dL — ABNORMAL HIGH (ref 70–99)
Potassium: 3.5 mmol/L (ref 3.5–5.1)
Sodium: 135 mmol/L (ref 135–145)
Total Bilirubin: 0.9 mg/dL (ref 0.3–1.2)
Total Protein: 6.2 g/dL — ABNORMAL LOW (ref 6.5–8.1)

## 2020-03-23 MED ORDER — SODIUM CHLORIDE 0.9% FLUSH: 3.0000 mL | INTRAVENOUS | Status: DC | PRN

## 2020-03-23 MED ORDER — SODIUM CHLORIDE 0.9 % IV SOLN: INTRAVENOUS | Status: DC

## 2020-03-23 MED ORDER — ENSURE ENLIVE PO LIQD
237.0000 mL | ORAL | Status: DC
  Administered 2020-03-23: 237 mL via ORAL

## 2020-03-23 MED ORDER — BOOST PLUS PO LIQD
237.0000 mL | Freq: Two times a day (BID) | ORAL | Status: DC
  Administered 2020-03-23 – 2020-03-25 (×3): 237 mL via ORAL
  Filled 2020-03-23 (×7): qty 237

## 2020-03-23 MED ORDER — SODIUM CHLORIDE 0.9% FLUSH
3.0000 mL | Freq: Two times a day (BID) | INTRAVENOUS | Status: DC
  Administered 2020-03-23: 3 mL via INTRAVENOUS

## 2020-03-23 MED ORDER — ASPIRIN 81 MG PO CHEW
81.0000 mg | CHEWABLE_TABLET | ORAL | Status: AC
  Administered 2020-03-24: 81 mg via ORAL
  Filled 2020-03-23: qty 1

## 2020-03-23 MED ORDER — SODIUM CHLORIDE 0.9 % IV SOLN: 250.0000 mL | INTRAVENOUS | Status: DC | PRN

## 2020-03-23 NOTE — Progress Notes (Signed)
Initial Nutrition Assessment  DOCUMENTATION CODES:   Non-severe (moderate) malnutrition in context of chronic illness  INTERVENTION:   Downgrade diet to DYS 1 per patient request   Ensure Enlive po BID, each supplement provides 350 kcal and 20 grams of protein  Boost Breeze po daily, each supplement provides 250 kcal and 9 grams of protein  Magic cup BID with meals, each supplement provides 290 kcal and 9 grams of protein  NUTRITION DIAGNOSIS:   Moderate Malnutrition related to chronic illness(L mandible osteomyelitis/necrosis) as evidenced by mild fat depletion, mild muscle depletion.  GOAL:   Patient will meet greater than or equal to 90% of their needs  MONITOR:   PO intake, Supplement acceptance, Weight trends, Labs, I & O's  REASON FOR ASSESSMENT:   Malnutrition Screening Tool    ASSESSMENT:   Patient with PMH significant for prostate cancer with metastases to the bone, squamous cell carcinoma of L tonsil in 2009 (s/p radiation, chemo, resection), multiple previous CVAs, HTN, HLD, and osteonecrosis/osteomyelitis of L mandible s/p reconstruction. Presents this admission with acute exacerbation CHF.   Pt endorses having mandible surgery in April. Since this time his intake decreased significantly due to resulting poor dentition. States after his surgery his appetite was okay but had to consume mostly liquids and pureed foods. Swallowing has been difficult since his radiation teatments in 2012. During the last month meals consisted of B- Boost, soft eggs, soft bacon, and gravy biscuit L- Boost, pudding, ice cream D- Boost, pudding, ice cream. Discussed importance of continued supplementation and reviewed high protein high calorie items that can be pureed or is of softer consistency. Pt reports dysphagia 2 diet is still difficult for him to chew. Reviewed a pureed diet with pt and he would like to try.   Pt reports a UBW of 225 lb and a recent weight loss of 30 lb. Records  indicate pt weighed 196 lb on 08/26/2019 and 191 lb this admission. Suspect weight loss is greater but unable to quantify with current volume status.   Medications: reviewed  Labs: CBG 103-114  NUTRITION - FOCUSED PHYSICAL EXAM:    Most Recent Value  Orbital Region  Mild depletion  Upper Arm Region  Mild depletion  Thoracic and Lumbar Region  Unable to assess  Buccal Region  No depletion  Temple Region  Mild depletion  Clavicle Bone Region  Mild depletion  Clavicle and Acromion Bone Region  Mild depletion  Scapular Bone Region  Unable to assess  Dorsal Hand  No depletion  Patellar Region  No depletion  Anterior Thigh Region  No depletion  Posterior Calf Region  No depletion  Edema (RD Assessment)  Mild  Hair  Reviewed  Eyes  Reviewed  Mouth  Reviewed  Skin  Reviewed  Nails  Reviewed     Diet Order:   Diet Order            Diet NPO time specified Except for: Sips with Meds  Diet effective midnight        Diet NPO time specified Except for: Sips with Meds  Diet effective midnight        DIET - DYS 1 Room service appropriate? Yes; Fluid consistency: Thin  Diet effective now              EDUCATION NEEDS:   Education needs have been addressed  Skin:  Skin Assessment: Reviewed RN Assessment  Last BM:  5/28  Height:   Ht Readings from Last 1 Encounters:  03/22/20 5\' 10"  (1.778 m)    Weight:   Wt Readings from Last 1 Encounters:  03/23/20 87 kg    BMI:  Body mass index is 27.52 kg/m.  Estimated Nutritional Needs:   Kcal:  2100-2300 kcal  Protein:  105-120 grams  Fluid:  >/= 2 L/day   Mariana Single RD, LDN Clinical Nutrition Pager listed in [place]

## 2020-03-23 NOTE — Progress Notes (Signed)
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 -Patient has a long history of orthostatic hypotension and dizziness, this persisted despite titrating down his antihypertensives, subsequently he was started on fludrocortisone few weeks ago. Since then pt 10 pound weight gain, dyspnea on exertion, increasing abdominal girth, orthopnea and PND, cough, had a negative Covid test and was seen in [facility] urgent [facility] on 5/29 and diagnosed with possible pneumonia and prescribed Levaquin without knowing the patient is already on chronic Levaquin therapy. -Patient symptoms started worsening and eventually presented to [facility], ED, BNP was noted to be 3200, high-sensitivity troponin in the 500-600 range, CT chest noted bilateral pleural effusions,  Assessment & Plan:   Acute systolic CHF/new onset -Fluid overloaded,, imaging consistent with bilateral pleural effusions, BNP 3210 -Diuresed with IV Lasix yesterday, he is -6 L, bump in creatinine noted and soft BPs, hold additional Lasix today -2D echocardiogram notes severe LV dysfunction, EF of 25% and grade 2 diastolic dysfunction -Will request cardiology input -Fludrocortisone stopped on admission -Monitor I's/O, weights and BMP in a.m. -Will add low-dose midodrine  Elevated high-sensitivity troponin -Flat trend, 500-600 range, patient denies any chest pain in the  last few weeks with activity or rest -Suspect this is demand ischemia in the setting of fluid overload and heart failure -2D echo notes severe LV dysfunction, could be ischemic -Cardiology consult requested, possible ischemia evaluation  History of seizure disorder -Continue home regimen of Lamictal  Chronic osteomyelitis of the mandible -Recent surgery at [facility], followed by bleeding complications -DVT prophylaxis held off -Followed by infectious disease, continue chronic levofloxacin  Orthostatic hypotension -Chronic at least ongoing for 6 to 8 months -Off all antihypertensives, fludrocortisone discontinued in the setting of fluid overload -Compression stockings/TED hose -Start low-dose midodrine  History of metastatic prostate cancer History of squamous cell carcinoma of left tonsil status post XRT and surgeries -Follow-up with oncology, oral surgery at [facility] Status:   DNR Family Communication:  Friend at bedside  Status is: Inpatient  Patient is inpatient status due to need for intravenous diuretics, new onset CHF, work-up  Dispo: The patient is from: Home  Anticipated d/c is to: Home  Anticipated d/c date is: > 3 days  Patient currently is not medically stable to d/c.   Procedures:   Antimicrobials:    Subjective: -Feels better, breathing considerably improved, blood pressure soft overnight, denies any dizziness  Objective: Vitals:   03/23/20 0132 03/23/20 0420 03/23/20 0422 03/23/20 0748  BP: (!) 88/51 96/62  98/61  Pulse: 82 82  83  Resp:  18  15  Temp:  97.9 F (36.6 C)  98.1 F (36.7 C)  TempSrc:  Oral  Oral  SpO2: 93% 95%  91%  Weight:   87 kg   Height:        Intake/Output Summary (Last 24 hours) at 03/23/2020 1139 Last data filed at 03/23/2020 0926 Gross per 24 hour  Intake 2340 ml  Output 3875 ml  Net -1535 ml  Filed Weights   03/21/20 1655 03/22/20 1503 03/23/20 0422    Weight: 93 kg 88.5 kg 87 kg    Examination:  General exam: Chronically ill elderly male sitting up in bed, AAOx3, no distress HEENT: Deformity of left mandible, postsurgical changes CVS S1-S2, regular rhythm Lungs with scant bibasilar Rales Abdomen is soft, nontender, bowel sounds present Extremities: No edema Skin: Discoloration of the skin submandibular region Psychiatry:  Mood & affect appropriate.     Data Reviewed:   CBC: Recent Labs  Lab 03/21/20 1636 03/22/20 0403 03/23/20 0429  WBC 6.1 6.1 7.0  NEUTROABS  --  3.9  --   HGB 8.5* 8.4* 8.6*  HCT 27.5* 26.6* 27.4*  MCV 91.7 91.4 91.0  PLT 318 275 [id number]   Basic Metabolic Panel: Recent Labs  Lab 03/21/20 1636 03/22/20 0403 03/23/20 0429  NA 130* 133* 135  K 4.8 4.4 3.5  CL 92* 94* 92*  CO2 27 29 32  GLUCOSE 132* 114* 103*  BUN 24* 19 21  CREATININE 1.11 1.17 1.31*  CALCIUM 9.2 9.1 9.2  MG  --  1.9  --    GFR: Estimated Creatinine Clearance: 52.6 mL/min (A) (by C-G formula based on SCr of 1.31 mg/dL (H)). Liver Function Tests: Recent Labs  Lab 03/21/20 1730 03/22/20 0403 03/23/20 0429  AST 21 19 17   ALT 13 12 11   ALKPHOS 84 78 75  BILITOT 0.4 0.6 0.9  PROT 6.8 6.1* 6.2*  ALBUMIN 3.6 3.2* 3.1*   No results for input(s): LIPASE, AMYLASE in the last 168 hours. No results for input(s): AMMONIA in the last 168 hours. Coagulation Profile: No results for input(s): INR, PROTIME in the last 168 hours. Cardiac Enzymes: No results for input(s): CKTOTAL, CKMB, CKMBINDEX, TROPONINI in the last 168 hours. BNP (last 3 results) No results for input(s): PROBNP in the last 8760 hours. HbA1C: No results for input(s): HGBA1C in the last 72 hours. CBG: No results for input(s): GLUCAP in the last 168 hours. Lipid Profile: No results for input(s): CHOL, HDL, LDLCALC, TRIG, CHOLHDL, LDLDIRECT in the last 72 hours. Thyroid Function Tests: No results for input(s): TSH, T4TOTAL, FREET4, T3FREE, THYROIDAB in the last  72 hours. Anemia Panel: Recent Labs    03/21/20 2200  VITAMINB12 747  FOLATE 28.1  TIBC 444  IRON 16*   Urine analysis:    Component Value Date/Time   COLORURINE YELLOW 03/21/2020 2205   APPEARANCEUR CLEAR 03/21/2020 2205   LABSPEC 1.015 03/21/2020 2205   PHURINE 7.0 03/21/2020 2205   GLUCOSEU NEGATIVE 03/21/2020 2205   HGBUR NEGATIVE 03/21/2020 [place] 03/21/2020 [place] 03/21/2020 2205   PROTEINUR 30 (A) 03/21/2020 2205   UROBILINOGEN 0.2 02/17/2014 1351   NITRITE NEGATIVE 03/21/2020 2205   LEUKOCYTESUR NEGATIVE 03/21/2020 2205   Sepsis Labs: @LABRCNTIP (procalcitonin:4,lacticidven:4)  ) Recent Results (from the past 240 hour(s))  SARS Coronavirus 2 by RT PCR (hospital order, performed in [facility] hospital lab) Nasopharyngeal Nasopharyngeal Swab     Status: None   Collection Time: 03/21/20  7:08 PM   Specimen: Nasopharyngeal Swab  Result Value Ref Range Status   SARS Coronavirus 2 NEGATIVE NEGATIVE Final    Comment: (NOTE) SARS-CoV-2 target nucleic acids are NOT DETECTED. The SARS-CoV-2 RNA is generally detectable in upper and lower respiratory specimens during the acute phase of infection. The lowest concentration of SARS-CoV-2 viral copies this assay can detect is 250 copies / mL. A negative result does not preclude SARS-CoV-2  infection and should not be used as the sole basis for treatment or other patient management decisions.  A negative result may occur with improper specimen collection / handling, submission of specimen other than nasopharyngeal swab, presence of viral mutation(s) within the areas targeted by this assay, and inadequate number of viral copies (<250 copies / mL). A negative result must be combined with clinical observations, patient history, and epidemiological information. Fact Sheet for Patients:   StrictlyIdeas.no Fact Sheet for Healthcare  Providers: BankingDealers.co.za This test is not yet approved or cleared  by the [place] FDA and has been authorized for detection and/or diagnosis of SARS-CoV-2 by FDA under an Emergency Use Authorization (EUA).  This EUA will remain in effect (meaning this test can be used) for the duration of the COVID-19 declaration under Section 564(b)(1) of the Act, 21 U.S.C. section 360bbb-3(b)(1), unless the authorization is terminated or revoked sooner. Performed at [facility], [place] [address], [place], [place] [address]   Culture, blood (routine x 2)     Status: None (Preliminary result)   Collection Time: 03/21/20 10:15 PM   Specimen: BLOOD RIGHT HAND  Result Value Ref Range Status   Specimen Description BLOOD RIGHT HAND  Final   Special Requests   Final    BOTTLES DRAWN AEROBIC AND ANAEROBIC Blood Culture results may not be optimal due to an inadequate volume of blood received in culture bottles   Culture   Final    NO GROWTH 2 DAYS Performed at [facility], [place] [address] [place], [place] [address]    Report Status PENDING  Incomplete  Culture, blood (routine x 2)     Status: None (Preliminary result)   Collection Time: 03/21/20 10:21 PM   Specimen: BLOOD  Result Value Ref Range Status   Specimen Description BLOOD RIGHT ARM  Final   Special Requests   Final    BOTTLES DRAWN AEROBIC AND ANAEROBIC Blood Culture results may not be optimal due to an inadequate volume of blood received in culture bottles   Culture   Final    NO GROWTH 2 DAYS Performed at [facility], [place] [address] [place], [place] [address]    Report Status PENDING  Incomplete         Radiology Studies: DG Chest 2 View  Result Date: 03/21/2020 CLINICAL DATA:  Pneumonia EXAM: CHEST - 2 VIEW COMPARISON:  Chest CT January 05 2010 FINDINGS: There is airspace opacity in the right base with small right pleural effusion. There is a minimal left pleural effusion with slight  left base atelectasis. Heart size and pulmonary vascularity normal. No adenopathy. There is degenerative change in the thoracic spine. IMPRESSION: Small pleural effusion on each side, larger on the right than on the left. Ill-defined airspace opacity in the right base, likely combination of atelectasis and a degree of potential pneumonia. Slight left base atelectasis noted. Cardiac silhouette within normal limits.  No adenopathy. Electronically Signed   By: Lowella Grip III M.D.   On: 03/21/2020 17:30   CT Angio Chest PE W and/or Wo Contrast  Result Date: 03/21/2020 CLINICAL DATA:  74 year old male with shortness of breath. History of prostate cancer. EXAM: CT ANGIOGRAPHY CHEST WITH CONTRAST TECHNIQUE: Multidetector CT imaging of the chest was performed using the standard protocol during bolus administration of intravenous contrast. Multiplanar CT image reconstructions and MIPs were obtained to evaluate the vascular anatomy. CONTRAST:  1101mL OMNIPAQUE IOHEXOL 350 MG/ML SOLN COMPARISON:  Chest radiograph dated  03/21/2020. FINDINGS: Cardiovascular: Top-normal cardiac size. No pericardial effusion. Advanced 3 vessel coronary vascular calcification. There is mild atherosclerotic calcification of the thoracic aorta. Evaluation of the aorta is limited due to non opacification and timing of the contrast. Evaluation of the pulmonary arteries is limited due to suboptimal opacification of the peripheral branches. No large or central pulmonary artery embolus identified. Mediastinum/Nodes: No hilar or mediastinal adenopathy. The esophagus is grossly unremarkable. No mediastinal fluid collection. Lungs/Pleura: Moderate right and small left pleural effusions with associated partial compressive atelectasis of the lower lobes. Pneumonia is not excluded. Clinical correlation is recommended. There is diffuse interstitial and interlobular septal prominence most consistent with edema. There is no pneumothorax. The central  airways are patent. Upper Abdomen: Partially visualized air pocket adjacent to the stomach (144/5) is indeterminate but may represent air within a segment of the colon. An extraluminal air is not entirely excluded. Musculoskeletal: Degenerative changes of the spine. Sclerotic changes of the T3 and T4 slightly progressed since the CT of 01/06/2020. no acute osseous pathology. Review of the MIP images confirms the above findings. IMPRESSION: 1. No CT evidence of central pulmonary artery embolus. 2. Moderate right and small left pleural effusions with associated partial compressive atelectasis of the lower lobes. Pneumonia is not excluded. Clinical correlation is recommended. 3. Diffuse interstitial and interlobular septal prominence most consistent with edema. 4. Sclerotic changes of the T3 and T4 slightly progressed since the CT of 01/06/2020. 5. Partially visualized indeterminate air pocket adjacent to the stomach. This may represent an intraluminal pocket of air, although pneumoperitoneum is not excluded. 6. Aortic Atherosclerosis (ICD10-I70.0). Electronically Signed   By: Anner Crete M.D.   On: 03/21/2020 19:53   ECHOCARDIOGRAM COMPLETE  Result Date: 03/22/2020    ECHOCARDIOGRAM REPORT   Patient Name:   Connor [name] Date of Exam: 03/22/2020 Medical Rec #:  [id number]            Height:       70.0 in Accession #:    [id number]           Weight:       205.0 lb Date of Birth:  23-Dec-1945            BSA:          2.109 m Patient Age:    16 years             BP:           113/89 mmHg Patient Gender: M                    HR:           85 bpm. Exam Location:  Inpatient Procedure: 2D Echo Indications:    CHF-Acute Systolic [id number] / [id number]  History:        Patient has prior history of Echocardiogram examinations, most                 recent 10/04/2011. CHF, Stroke; Risk Factors:Hypertension,                 Dyslipidemia and Non-Smoker.  Sonographer:    Leavy Cella Referring Phys: [id number] [place]  1. Akinesis of the anteroseptal, apical and basal inferior walls with overall severe LV dysfunction; grade 2 diastolic dysfunction; mild LVE; trace AI.  2. Left ventricular ejection fraction, by estimation, is 25 to 30%. The left ventricle has severely decreased function. The left ventricle demonstrates regional wall motion abnormalities (  see scoring diagram/findings for description). The left ventricular internal cavity size was mildly dilated. Left ventricular diastolic parameters are consistent with Grade II diastolic dysfunction (pseudonormalization). Elevated left atrial pressure.  3. Right ventricular systolic function is normal. The right ventricular size is normal. There is mildly elevated pulmonary artery systolic pressure.  4. The mitral valve is normal in structure. No evidence of mitral valve regurgitation. No evidence of mitral stenosis.  5. The aortic valve is normal in structure. Aortic valve regurgitation is trivial. Mild to moderate aortic valve sclerosis/calcification is present, without any evidence of aortic stenosis.  6. The inferior vena cava is normal in size with greater than 50% respiratory variability, suggesting right atrial pressure of 3 mmHg. FINDINGS  Left Ventricle: Left ventricular ejection fraction, by estimation, is 25 to 30%. The left ventricle has severely decreased function. The left ventricle demonstrates regional wall motion abnormalities. The left ventricular internal cavity size was mildly  dilated. There is no left ventricular hypertrophy. Left ventricular diastolic parameters are consistent with Grade II diastolic dysfunction (pseudonormalization). Elevated left atrial pressure. Right Ventricle: The right ventricular size is normal.Right ventricular systolic function is normal. There is mildly elevated pulmonary artery systolic pressure. The tricuspid regurgitant velocity is 2.88 m/s, and with an assumed right atrial pressure of  3 mmHg, the estimated  right ventricular systolic pressure is 0000000 mmHg. Left Atrium: Left atrial size was normal in size. Right Atrium: Right atrial size was normal in size. Pericardium: A small pericardial effusion is present. Mitral Valve: The mitral valve is normal in structure. Normal mobility of the mitral valve leaflets. Mild mitral annular calcification. No evidence of mitral valve regurgitation. No evidence of mitral valve stenosis. Tricuspid Valve: The tricuspid valve is normal in structure. Tricuspid valve regurgitation is mild . No evidence of tricuspid stenosis. Aortic Valve: The aortic valve is normal in structure. Aortic valve regurgitation is trivial. Aortic regurgitation PHT measures 388 msec. Mild to moderate aortic valve sclerosis/calcification is present, without any evidence of aortic stenosis. Pulmonic Valve: The pulmonic valve was normal in structure. Pulmonic valve regurgitation is mild. No evidence of pulmonic stenosis. Aorta: The aortic root is normal in size and structure. Venous: The inferior vena cava is normal in size with greater than 50% respiratory variability, suggesting right atrial pressure of 3 mmHg. IAS/Shunts: No atrial level shunt detected by color flow Doppler. Additional Comments: Akinesis of the anteroseptal, apical and basal inferior walls with overall severe LV dysfunction; grade 2 diastolic dysfunction; mild LVE; trace AI.  LEFT VENTRICLE PLAX 2D LVIDd:         5.57 cm  Diastology LVIDs:         4.34 cm  LV e' lateral:   5.77 cm/s LV PW:         1.49 cm  LV E/e' lateral: 13.7 LV IVS:        1.05 cm  LV e' medial:    4.46 cm/s LVOT diam:     1.90 cm  LV E/e' medial:  17.8 LVOT Area:     2.84 cm  RIGHT VENTRICLE RV S prime:     11.30 cm/s TAPSE (M-mode): 2.2 cm LEFT ATRIUM           Index       RIGHT ATRIUM           Index LA diam:      3.80 cm 1.80 cm/m  RA Area:     16.80 cm LA Vol (A2C): 57.8 ml 27.40 ml/m  RA Volume:   52.90 ml  25.08 ml/m LA Vol (A4C): 54.2 ml 25.69 ml/m  AORTIC VALVE  AI PHT:      388 msec  AORTA Ao Root diam: 3.10 cm MITRAL VALVE               TRICUSPID VALVE MV Area (PHT): 4.15 cm    TR Peak grad:   33.2 mmHg MV Decel Time: 183 msec    TR Vmax:        288.00 cm/s MR Peak grad: 44.1 mmHg MR Mean grad: 29.0 mmHg    SHUNTS MR Vmax:      332.00 cm/s  Systemic Diam: 1.90 cm MR Vmean:     258.0 cm/s MV E velocity: 79.30 cm/s MV A velocity: 64.70 cm/s MV E/A ratio:  1.23 Kirk Ruths MD Electronically signed by Kirk Ruths MD Signature Date/Time: 03/22/2020/1:24:26 PM    Final    Scheduled Meds: . aspirin  81 mg Oral Daily  . chlorhexidine  15 mL Mouth Rinse BID  . feeding supplement  1 Container Oral TID BM  . lamoTRIgine  100 mg Oral q AM  . lamoTRIgine  200 mg Oral QPM  . levofloxacin  500 mg Oral Daily  . pantoprazole  40 mg Oral Daily  . simvastatin  20 mg Oral QPM  . sodium chloride flush  3 mL Intravenous Q12H   Continuous Infusions: . sodium chloride       LOS: 2 days    Time spent: 53min  Domenic Polite, MD [facility] 03/23/2020, 11:39 AM

## 2020-03-23 NOTE — Consult Note (Signed)
Cardiology Consultation:   Patient ID: Connor [name] [facility] MRN: [id number]; DOB: October 20, 1946  Admit date: 03/21/2020 Date of Consult: 03/23/2020  Primary Care Provider: Josetta Huddle, MD Primary Cardiologist: New to [facility] Primary Electrophysiologist:  None    Patient Profile:   Connor [name] is a 74 y.o. 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 Broadus John.  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  Patient has been having orthostatic dizziness upon standing for the past 3 to 4 months.  Previous blood pressure medication has been reduced.  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  He has been placed on chronic Levaquin antibiotic therapy.  Postop recovery was complicated by oral bleeding.  Since his surgery, he still has issues chewing food and therefore has been using Ensure protein supplement on a consistent basis.  His cancer has been followed by the [facility], Dr. Lavera Guise.  He says he has been receiving Lupron injections for several years now and is currently on a every 90-day schedule for the injection.  Due to overall  worsening orthostatic hypotension, he has been placed on fludrocortisone to help with sodium and fluid retention.  Since starting on the medication, he has been noticing worsening dyspnea with exertion for the past week.  Prior to arrival to [facility], patient was seen in the urgent care and there was diagnosed with pneumonia and was prescribed Levaquin unbeknownst to the prescriber that he was already on the [place].  Initial troponin was 600.  BNP was greater than 3000.  Chest x-ray showed small pleural effusion bilaterally, larger on the right than compared to the left side.  CT angiogram of the chest obtained on 03/21/2020 showed no evidence of PE, moderate right and small left pleural effusion, pneumonia was not excluded.  Patient was admitted to hospitalist service for heart failure symptoms.  Subsequent high-sensitivity serial troponin has remained flat.  Patient denies any recent exertional chest pain.  However his EKG on arrival showed poor R wave progression and incomplete heart block that was significantly changed when compared to the previous EKG in 2015.  The case was discussed with cardiology fellow on-call when he arrived.  He was treated with IV Lasix with good urinary output.  Fludrocortisone was stopped due to concern of fluid and sodium retention.  Since arrival, he has put out roughly 6 L of fluid.  Echocardiogram performed on 03/22/2020 showed EF 25 to 30%, akinesis of the anteroseptal, apical and basal inferior wall with severe LV dysfunction, grade 2 DD, no significant valve issue.  Cardiology service has been consulted for abnormal echocardiogram and heart failure.   Past Medical History:  Diagnosis Date  . Arthritis    OA  AND PAIN RT KNEE  . Cancer ([place])    h/o neck - ABOUT 6 YRS AGO - TX'D WITH RADIATION AND CHEMO   . Chronic kidney disease   . GERD without esophagitis 03/21/2020  . Head and neck cancer ~ 2009   S/P radiation & [name]  . Headache(784.0)     . Hyperlipidemia   . Hypertension   . Kidney carcinoma ([place])    h/o - NEPHRECTOMY   . Osteonecrosis of jaw ([place])    Secondary to radiation therapy  . Prostate cancer ([place]) 05/20/14   Gleason 4+3=7, volume 25 gm  . Radiation 2015   hx of, prostate cancer  . Seizures ([place])    hx of x yrs, "the bad kind;bite tongue; STATES LAST [place] 2013; WAS SEEING [name] - HE RETIRED AND PT LAST SAW DR. [name]  . Stroke ([place]) [place] 2013   UNABLE TO SPEAK OR MOVE AND RT SIDE WEAKNESS AND LOSS OF SKIN SENSITIVITY TO HEAT AND COLD ON RT SIDE, DOUBLE VISION. BALANCE PROBLEMS---STATES STILL HAS DOUBLE VISION AND BALANCE PROBLEM AND RT SIDED LOSS OF SKIN SENSITIVITY    Past Surgical History:  Procedure Laterality Date  . hydrocelectomy  11/2000   left  . IR FLUORO GUIDE CV LINE RIGHT  08/31/2017  . IR US GUIDE VASC ACCESS RIGHT  08/31/2017  . JOINT REPLACEMENT     LEFT TOTAL KNEE ARTHROPLASTY  . MOUTH SURGERY  2019  . NEPHRECTOMY  1990's   left  . PROSTATE BIOPSY  05/20/14   Gleason 4+3=7, vol 25 gm  . TEE WITHOUT CARDIOVERSION  10/04/2011   Procedure: TRANSESOPHAGEAL ECHOCARDIOGRAM (TEE);  Surgeon: Candee Furbish, MD;  Location: [facility] ENDOSCOPY;  Service: Cardiovascular;  Laterality: N/A;  . TOTAL KNEE ARTHROPLASTY  2011   left  . TOTAL KNEE ARTHROPLASTY Right 02/24/2014   Procedure: RIGHT TOTAL KNEE ARTHROPLASTY;  Surgeon: Gearlean Alf, MD;  Location: [facility] ORS;  Service: Orthopedics;  Laterality: Right;     Home Medications:  Prior to Admission medications   Medication Sig Start Date End Date Taking? Authorizing Provider  albuterol (VENTOLIN HFA) 108 (90 Base) MCG/ACT inhaler Inhale 2 puffs into the lungs in the morning, at noon, and at bedtime. 03/19/20  Yes [provider]  aspirin 81 MG tablet Take 81 mg by mouth daily.   Yes [provider]  Cyanocobalamin (VITAMIN B-12 PO) Take 1 tablet by mouth daily.   Yes [provider]  ferrous sulfate  325 (65 FE) MG tablet Take 325 mg by mouth 2 (two) times daily with a meal.   Yes [provider]  fluconazole (DIFLUCAN) 200 MG tablet Take 2 tablets (400 mg total) by mouth daily. 10/31/19  Yes Carlyle Basques, MD  fludrocortisone (FLORINEF) 0.1 MG tablet Take 100 mcg by mouth daily. 03/05/20  Yes [provider]  lamoTRIgine (LAMICTAL) 100 MG tablet 100mg  in AM and 200mg  in PM Patient taking differently: Take 100-200 mg by mouth See admin instructions. Take 1 tablet every morning and take 2 tablets every evening 02/26/19  Yes Penumalli, Vikram R, MD  Leuprolide Acetate, 3 Month, (LUPRON DEPOT, 18-MONTH, IM) Inject 1 each into the muscle every 3 (three) months.   Yes [provider]  levofloxacin (LEVAQUIN) 500 MG tablet Take 1 tablet (500 mg total) by mouth daily. Patient taking differently: Take 500 mg by mouth every evening.  03/03/20  Yes Carlyle Basques, MD  simvastatin (ZOCOR) 20 MG  tablet Take 20 mg by mouth every evening.   Yes [provider]    Inpatient Medications: Scheduled Meds: . aspirin  81 mg Oral Daily  . chlorhexidine  15 mL Mouth Rinse BID  . feeding supplement (ENSURE ENLIVE)  237 mL Oral Q24H  . lactose free nutrition  237 mL Oral BID BM  . lamoTRIgine  100 mg Oral q AM  . lamoTRIgine  200 mg Oral QPM  . levofloxacin  500 mg Oral Daily  . pantoprazole  40 mg Oral Daily  . simvastatin  20 mg Oral QPM  . sodium chloride flush  3 mL Intravenous Q12H   Continuous Infusions: . sodium chloride     PRN Meds: sodium chloride, acetaminophen, ipratropium-albuterol, ondansetron (ZOFRAN) IV, polyethylene glycol, sodium chloride flush  Allergies:    Allergies  Allergen Reactions  . Morphine And Related Nausea And Vomiting    Social History:   Social History   Socioeconomic History  . Marital status: Divorced    Spouse name: Not on file  . Number of children: 2  . Years of education: 12th  . Highest education level: Not on file   Occupational History  . Occupation: Information systems manager: OTHER    Comment: [employer]  Tobacco Use  . Smoking status: Never Smoker  . Smokeless tobacco: Never Used  Substance and Sexual Activity  . Alcohol use: Yes    Alcohol/week: 0.0 standard drinks    Comment: WAS 6 TO 8 BEERS DAILY   . Drug use: No  . Sexual activity: Not Currently  Other Topics Concern  . Not on file  Social History Narrative   Patient is divorced with 2 children.   Patient is right handed.   Patient has hs education.   Patient drinks 2 cups daily.   Social Determinants of Health   Financial Resource Strain:   . Difficulty of Paying Living Expenses:   Food Insecurity:   . Worried About Charity fundraiser in the Last Year:   . Arboriculturist in the Last Year:   Transportation Needs:   . Film/video editor (Medical):   Marland Kitchen Lack of Transportation (Non-Medical):   Physical Activity:   . Days of Exercise per Week:   . Minutes of Exercise per Session:   Stress:   . Feeling of Stress :   Social Connections:   . Frequency of Communication with Friends and Family:   . Frequency of Social Gatherings with Friends and Family:   . Attends Religious Services:   . Active Member of Clubs or Organizations:   . Attends Archivist Meetings:   Marland Kitchen Marital Status:   Intimate Partner Violence:   . Fear of Current or Ex-Partner:   . Emotionally Abused:   Marland Kitchen Physically Abused:   . Sexually Abused:     Family History:    Family History  Problem Relation Age of Onset  . Diabetes Mother   . Heart disease Mother   . Heart disease Father      ROS:  Please see the history of present illness.   All other ROS reviewed and negative.     Physical Exam/Data:   Vitals:   03/23/20 0420 03/23/20 0422 03/23/20 0748 03/23/20 1207  BP: 96/62  98/61 104/68  Pulse: 82  83 87  Resp: 18  15 18   Temp: 97.9 F (36.6 C)  98.1 F (36.7 C) 97.8 F (36.6 C)  TempSrc: Oral  Oral Oral  SpO2: 95%  91%  95%  Weight:  87 kg    Height:        Intake/Output Summary (Last 24 hours) at 03/23/2020 1219 Last data filed at 03/23/2020 1210 Gross per 24 hour  Intake 2700 ml  Output 3875 ml  Net -1175 ml   Last 3 Weights 03/23/2020 03/22/2020 03/21/2020  Weight (lbs) 191 lb 12.8 oz 195 lb 1.7 oz 205 lb 0.4 oz  Weight (kg) 87 kg 88.5 kg 93 kg     Body mass index is 27.52 kg/m.  General:  Well nourished, well developed, in no acute distress HEENT: normal Lymph: no adenopathy Neck: no JVD Endocrine:  No thryomegaly Vascular: No carotid bruits; FA pulses 2+ bilaterally without bruits  Cardiac:  normal S1, S2; RRR; no murmur  Lungs:  clear to auscultation bilaterally, no wheezing, rhonchi or rales  Abd: soft, nontender, no hepatomegaly  Ext: no edema Musculoskeletal:  No deformities, BUE and BLE strength normal and equal Skin: warm and dry  Neuro:  CNs 2-12 intact, no focal abnormalities noted Psych:  Normal affect   EKG:  The EKG was personally reviewed and demonstrates: Normal sinus rhythm with poor R wave progression and Q waves in the anterior leads concerning for previous anterior infarct, developing incomplete left bundle branch block. Telemetry:  Telemetry was personally reviewed and demonstrates: Normal sinus rhythm without significant ventricular ectopy.  Relevant CV Studies:  Echo 10/04/2011 LV EF: 55% -  60%  Study Conclusions   - Left ventricle: The cavity size was normal. Wall thickness  was normal. Systolic function was normal. The estimated  ejection fraction was in the range of 55% to 60%.  - Aortic valve: No evidence of vegetation. Trivial  regurgitation.  - Mitral valve: No evidence of vegetation. Mild  regurgitation.  - Left atrium: No evidence of thrombus in the atrial cavity  or appendage.  - Right atrium: No evidence of thrombus in the atrial cavity  or appendage.  - Atrial septum: No defect or patent foramen ovale was  identified. Echo contrast  study showed no right-to-left  atrial level shunt, following an increase in RA pressure  induced by provocative maneuvers.  - Tricuspid valve: No evidence of vegetation.  - Pulmonic valve: No evidence of vegetation.  - Superior vena cava: The study excluded a thrombus.  Impressions:   - No cardiac source of emboli was indentified.     Echo 03/22/2020 1. Akinesis of the anteroseptal, apical and basal inferior walls with  overall severe LV dysfunction; grade 2 diastolic dysfunction; mild LVE;  trace AI.  2. Left ventricular ejection fraction, by estimation, is 25 to 30%. The  left ventricle has severely decreased function. The left ventricle  demonstrates regional wall motion abnormalities (see scoring  diagram/findings for description). The left  ventricular internal cavity size was mildly dilated. Left ventricular  diastolic parameters are consistent with Grade II diastolic dysfunction  (pseudonormalization). Elevated left atrial pressure.  3. Right ventricular systolic function is normal. The right ventricular  size is normal. There is mildly elevated pulmonary artery systolic  pressure.  4. The mitral valve is normal in structure. No evidence of mitral valve  regurgitation. No evidence of mitral stenosis.  5. The aortic valve is normal in structure. Aortic valve regurgitation is  trivial. Mild to moderate aortic valve sclerosis/calcification is present,  without any evidence of aortic stenosis.  6. The inferior vena cava is normal in size with greater than 50%  respiratory  variability, suggesting right atrial pressure of 3 mmHg.   Laboratory Data:  High Sensitivity Troponin:   Recent Labs  Lab 03/21/20 1636 03/21/20 1908 03/22/20 0100 03/22/20 0649  TROPONINIHS 616* 569* 613* 620*     Chemistry Recent Labs  Lab 03/21/20 1636 03/22/20 0403 03/23/20 0429  NA 130* 133* 135  K 4.8 4.4 3.5  CL 92* 94* 92*  CO2 27 29 32  GLUCOSE 132* 114* 103*  BUN 24* 19  21  CREATININE 1.11 1.17 1.31*  CALCIUM 9.2 9.1 9.2  GFRNONAA >60 >60 54*  GFRAA >60 >60 >60  ANIONGAP 11 10 11     Recent Labs  Lab 03/21/20 1730 03/22/20 0403 03/23/20 0429  PROT 6.8 6.1* 6.2*  ALBUMIN 3.6 3.2* 3.1*  AST 21 19 17   ALT 13 12 11   ALKPHOS 84 78 75  BILITOT 0.4 0.6 0.9   Hematology Recent Labs  Lab 03/21/20 1636 03/22/20 0403 03/23/20 0429  WBC 6.1 6.1 7.0  RBC 3.00* 2.91* 3.01*  HGB 8.5* 8.4* 8.6*  HCT 27.5* 26.6* 27.4*  MCV 91.7 91.4 91.0  MCH 28.3 28.9 28.6  MCHC 30.9 31.6 31.4  RDW 14.9 14.7 14.4  PLT 318 275 287   BNP Recent Labs  Lab 03/21/20 1906  BNP 3,210.4*    DDimer No results for input(s): DDIMER in the last 168 hours.   Radiology/Studies:  DG Chest 2 View  Result Date: 03/21/2020 CLINICAL DATA:  Pneumonia EXAM: CHEST - 2 VIEW COMPARISON:  Chest CT January 05 2010 FINDINGS: There is airspace opacity in the right base with small right pleural effusion. There is a minimal left pleural effusion with slight left base atelectasis. Heart size and pulmonary vascularity normal. No adenopathy. There is degenerative change in the thoracic spine. IMPRESSION: Small pleural effusion on each side, larger on the right than on the left. Ill-defined airspace opacity in the right base, likely combination of atelectasis and a degree of potential pneumonia. Slight left base atelectasis noted. Cardiac silhouette within normal limits.  No adenopathy. Electronically Signed   By: Lowella Grip III M.D.   On: 03/21/2020 17:30   CT Angio Chest PE W and/or Wo Contrast  Result Date: 03/21/2020 CLINICAL DATA:  73 year old male with shortness of breath. History of prostate cancer. EXAM: CT ANGIOGRAPHY CHEST WITH CONTRAST TECHNIQUE: Multidetector CT imaging of the chest was performed using the standard protocol during bolus administration of intravenous contrast. Multiplanar CT image reconstructions and MIPs were obtained to evaluate the vascular anatomy. CONTRAST:   151mL OMNIPAQUE IOHEXOL 350 MG/ML SOLN COMPARISON:  Chest radiograph dated 03/21/2020. FINDINGS: Cardiovascular: Top-normal cardiac size. No pericardial effusion. Advanced 3 vessel coronary vascular calcification. There is mild atherosclerotic calcification of the thoracic aorta. Evaluation of the aorta is limited due to non opacification and timing of the contrast. Evaluation of the pulmonary arteries is limited due to suboptimal opacification of the peripheral branches. No large or central pulmonary artery embolus identified. Mediastinum/Nodes: No hilar or mediastinal adenopathy. The esophagus is grossly unremarkable. No mediastinal fluid collection. Lungs/Pleura: Moderate right and small left pleural effusions with associated partial compressive atelectasis of the lower lobes. Pneumonia is not excluded. Clinical correlation is recommended. There is diffuse interstitial and interlobular septal prominence most consistent with edema. There is no pneumothorax. The central airways are patent. Upper Abdomen: Partially visualized air pocket adjacent to the stomach (144/5) is indeterminate but may represent air within a segment of the colon. An extraluminal air is not entirely excluded. Musculoskeletal: Degenerative changes of  the spine. Sclerotic changes of the T3 and T4 slightly progressed since the CT of 01/06/2020. no acute osseous pathology. Review of the MIP images confirms the above findings. IMPRESSION: 1. No CT evidence of central pulmonary artery embolus. 2. Moderate right and small left pleural effusions with associated partial compressive atelectasis of the lower lobes. Pneumonia is not excluded. Clinical correlation is recommended. 3. Diffuse interstitial and interlobular septal prominence most consistent with edema. 4. Sclerotic changes of the T3 and T4 slightly progressed since the CT of 01/06/2020. 5. Partially visualized indeterminate air pocket adjacent to the stomach. This may represent an intraluminal  pocket of air, although pneumoperitoneum is not excluded. 6. Aortic Atherosclerosis (ICD10-I70.0). Electronically Signed   By: Anner Crete M.D.   On: 03/21/2020 19:53   ECHOCARDIOGRAM COMPLETE  Result Date: 03/22/2020    ECHOCARDIOGRAM REPORT   Patient Name:   AVERELL BRINTNALL Date of Exam: 03/22/2020 Medical Rec #:  [id number]            Height:       70.0 in Accession #:    [id number]           Weight:       205.0 lb Date of Birth:  [birth year]            BSA:          2.109 m Patient Age:    53 years             BP:           113/89 mmHg Patient Gender: M                    HR:           85 bpm. Exam Location:  Inpatient Procedure: 2D Echo Indications:    CHF-Acute Systolic [id number] / [id number]  History:        Patient has prior history of Echocardiogram examinations, most                 recent 10/04/2011. CHF, Stroke; Risk Factors:Hypertension,                 Dyslipidemia and Non-Smoker.  Sonographer:    Leavy Cella Referring Phys: [id number] [place]  1. Akinesis of the anteroseptal, apical and basal inferior walls with overall severe LV dysfunction; grade 2 diastolic dysfunction; mild LVE; trace AI.  2. Left ventricular ejection fraction, by estimation, is 25 to 30%. The left ventricle has severely decreased function. The left ventricle demonstrates regional wall motion abnormalities (see scoring diagram/findings for description). The left ventricular internal cavity size was mildly dilated. Left ventricular diastolic parameters are consistent with Grade II diastolic dysfunction (pseudonormalization). Elevated left atrial pressure.  3. Right ventricular systolic function is normal. The right ventricular size is normal. There is mildly elevated pulmonary artery systolic pressure.  4. The mitral valve is normal in structure. No evidence of mitral valve regurgitation. No evidence of mitral stenosis.  5. The aortic valve is normal in structure. Aortic valve regurgitation is trivial.  Mild to moderate aortic valve sclerosis/calcification is present, without any evidence of aortic stenosis.  6. The inferior vena cava is normal in size with greater than 50% respiratory variability, suggesting right atrial pressure of 3 mmHg. FINDINGS  Left Ventricle: Left ventricular ejection fraction, by estimation, is 25 to 30%. The left ventricle has severely decreased function. The left ventricle demonstrates regional wall motion abnormalities. The left ventricular internal cavity  size was mildly  dilated. There is no left ventricular hypertrophy. Left ventricular diastolic parameters are consistent with Grade II diastolic dysfunction (pseudonormalization). Elevated left atrial pressure. Right Ventricle: The right ventricular size is normal.Right ventricular systolic function is normal. There is mildly elevated pulmonary artery systolic pressure. The tricuspid regurgitant velocity is 2.88 m/s, and with an assumed right atrial pressure of  3 mmHg, the estimated right ventricular systolic pressure is 0000000 mmHg. Left Atrium: Left atrial size was normal in size. Right Atrium: Right atrial size was normal in size. Pericardium: A small pericardial effusion is present. Mitral Valve: The mitral valve is normal in structure. Normal mobility of the mitral valve leaflets. Mild mitral annular calcification. No evidence of mitral valve regurgitation. No evidence of mitral valve stenosis. Tricuspid Valve: The tricuspid valve is normal in structure. Tricuspid valve regurgitation is mild . No evidence of tricuspid stenosis. Aortic Valve: The aortic valve is normal in structure. Aortic valve regurgitation is trivial. Aortic regurgitation PHT measures 388 msec. Mild to moderate aortic valve sclerosis/calcification is present, without any evidence of aortic stenosis. Pulmonic Valve: The pulmonic valve was normal in structure. Pulmonic valve regurgitation is mild. No evidence of pulmonic stenosis. Aorta: The aortic root is normal  in size and structure. Venous: The inferior vena cava is normal in size with greater than 50% respiratory variability, suggesting right atrial pressure of 3 mmHg. IAS/Shunts: No atrial level shunt detected by color flow Doppler. Additional Comments: Akinesis of the anteroseptal, apical and basal inferior walls with overall severe LV dysfunction; grade 2 diastolic dysfunction; mild LVE; trace AI.  LEFT VENTRICLE PLAX 2D LVIDd:         5.57 cm  Diastology LVIDs:         4.34 cm  LV e' lateral:   5.77 cm/s LV PW:         1.49 cm  LV E/e' lateral: 13.7 LV IVS:        1.05 cm  LV e' medial:    4.46 cm/s LVOT diam:     1.90 cm  LV E/e' medial:  17.8 LVOT Area:     2.84 cm  RIGHT VENTRICLE RV S prime:     11.30 cm/s TAPSE (M-mode): 2.2 cm LEFT ATRIUM           Index       RIGHT ATRIUM           Index LA diam:      3.80 cm 1.80 cm/m  RA Area:     16.80 cm LA Vol (A2C): 57.8 ml 27.40 ml/m RA Volume:   52.90 ml  25.08 ml/m LA Vol (A4C): 54.2 ml 25.69 ml/m  AORTIC VALVE AI PHT:      388 msec  AORTA Ao Root diam: 3.10 cm MITRAL VALVE               TRICUSPID VALVE MV Area (PHT): 4.15 cm    TR Peak grad:   33.2 mmHg MV Decel Time: 183 msec    TR Vmax:        288.00 cm/s MR Peak grad: 44.1 mmHg MR Mean grad: 29.0 mmHg    SHUNTS MR Vmax:      332.00 cm/s  Systemic Diam: 1.90 cm MR Vmean:     258.0 cm/s MV E velocity: 79.30 cm/s MV A velocity: 64.70 cm/s MV E/A ratio:  1.23 Kirk Ruths MD Electronically signed by Kirk Ruths MD Signature Date/Time: 03/22/2020/1:24:26 PM    Final  TIMI Risk Score for Unstable Angina or Non-ST Elevation MI:   The patient's TIMI risk score is 4, which indicates a 20% risk of all cause mortality, new or recurrent myocardial infarction or need for urgent revascularization in the next 14 days.   Assessment and Plan:   1. Acute systolic heart failure  -EKG obtained during this admission showed possible Q waves and poor R wave progression that is drastically changed when compared  to the previous EKG in 2015.    -Echocardiogram showed EF 25 to 30% with akinesis of the anteroseptal, apical and basal inferior wall   -MD to review echocardiogram to see if the location of wall motion abnormality may be related to anterior wall infarct.  Patient however denies any recent exertional chest pain.  -I suspect that acute systolic heart failure was contributed by fludrocortisone however his EF likely has been down given prior to that. He is currently euvolemic after putting out 6L during this admission.   -Agree with holding fludrocortisone, may use midodrine if needed to help with orthostatic hypotension.  -Given akinesis seen on the echocardiogram and the lack of chest discomfort, will discuss with MD to see if the patient may require a Myoview for risk stratification and see if there is any area of reversibility.  -Medical therapy for heart failure complicated by orthostatic hypotension.  May need to consider low-dose digoxin.  May also add low-dose carvedilol if he can start on midodrine.  2. Orthostatic hypotension with history of hypertension  -Patient says orthostatic hypotension has started in the past several month, however this was preceding his jaw surgery.  He does have hypoalbuminemia since this jaw surgery due to poor oral intake requiring the assistance of Ensure, however albumin of 3.1 should not be severe enough to generate orthostatic hypotension  -Hold fludrocortisone for now, consider midodrine if needed for low blood pressure upon standing  -he is not on any blood pressure medication prior to arrival  3. Hypoalbuminemia  -Related to poor oral intake after jaw surgery  4. Hyperlipidemia: On simvastatin  5. History of prostate cancer: Followed by [facility] center.  Although notes from [facility] suggested he has metastasis to the spine, however patient is not aware of any metastasis  6. Squamous cell carcinoma of the left tonsil in 2009: Underwent surgical  resection, chemo and radiation therapy  7. 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

## 2020-03-23 NOTE — Progress Notes (Signed)
Pt found to be sleeping when rounded on.

## 2020-03-23 NOTE — Progress Notes (Signed)
Patient awake c/a/ox4 during report, denies complaints.

## 2020-03-23 NOTE — Progress Notes (Signed)
Patient with low BP, patient says he feels fine and no typical symptoms for hypotension. Provider notified. Will continue to assess/monitor.

## 2020-03-24 ENCOUNTER — Other Ambulatory Visit: Payer: Self-pay | Admitting: *Deleted

## 2020-03-24 ENCOUNTER — Encounter (HOSPITAL_COMMUNITY)
Admission: EM | Disposition: A | Discharge status: Home Health | Payer: Self-pay | Source: Home / Self Care | Attending: Cardiothoracic Surgery

## 2020-03-24 DIAGNOSIS — I25110 Atherosclerotic heart disease of native coronary artery with unstable angina pectoris: Secondary | ICD-10-CM

## 2020-03-24 DIAGNOSIS — I5021 Acute systolic (congestive) heart failure: Secondary | ICD-10-CM

## 2020-03-24 DIAGNOSIS — I255 Ischemic cardiomyopathy: Secondary | ICD-10-CM

## 2020-03-24 DIAGNOSIS — E440 Moderate protein-calorie malnutrition: Secondary | ICD-10-CM | POA: Insufficient documentation

## 2020-03-24 DIAGNOSIS — I2510 Atherosclerotic heart disease of native coronary artery without angina pectoris: Secondary | ICD-10-CM

## 2020-03-24 HISTORY — PX: RIGHT/LEFT HEART CATH AND CORONARY ANGIOGRAPHY: CATH118266

## 2020-03-24 LAB — POCT I-STAT 7, (LYTES, BLD GAS, ICA,H+H)
Acid-Base Excess: 5 mmol/L — ABNORMAL HIGH (ref 0.0–2.0)
Bicarbonate: 31.1 mmol/L — ABNORMAL HIGH (ref 20.0–28.0)
Calcium, Ion: 1.22 mmol/L (ref 1.15–1.40)
HCT: 28 % — ABNORMAL LOW (ref 39.0–52.0)
Hemoglobin: 9.5 g/dL — ABNORMAL LOW (ref 13.0–17.0)
O2 Saturation: 91 %
Potassium: 3.6 mmol/L (ref 3.5–5.1)
Sodium: 133 mmol/L — ABNORMAL LOW (ref 135–145)
TCO2: 33 mmol/L — ABNORMAL HIGH (ref 22–32)
pCO2 arterial: 50.1 mmHg — ABNORMAL HIGH (ref 32.0–48.0)
pH, Arterial: 7.401 (ref 7.350–7.450)
pO2, Arterial: 61 mmHg — ABNORMAL LOW (ref 83.0–108.0)

## 2020-03-24 LAB — CBC
HCT: 28.2 % — ABNORMAL LOW (ref 39.0–52.0)
Hemoglobin: 8.7 g/dL — ABNORMAL LOW (ref 13.0–17.0)
MCH: 27.7 pg (ref 26.0–34.0)
MCHC: 30.9 g/dL (ref 30.0–36.0)
MCV: 89.8 fL (ref 80.0–100.0)
Platelets: 303 10*3/uL (ref 150–400)
RBC: 3.14 MIL/uL — ABNORMAL LOW (ref 4.22–5.81)
RDW: 14.1 % (ref 11.5–15.5)
WBC: 6.4 10*3/uL (ref 4.0–10.5)
nRBC: 0 % (ref 0.0–0.2)

## 2020-03-24 LAB — POCT I-STAT EG7
Acid-Base Excess: 7 mmol/L — ABNORMAL HIGH (ref 0.0–2.0)
Bicarbonate: 32.3 mmol/L — ABNORMAL HIGH (ref 20.0–28.0)
Calcium, Ion: 1.21 mmol/L (ref 1.15–1.40)
HCT: 28 % — ABNORMAL LOW (ref 39.0–52.0)
Hemoglobin: 9.5 g/dL — ABNORMAL LOW (ref 13.0–17.0)
O2 Saturation: 63 %
Potassium: 3.6 mmol/L (ref 3.5–5.1)
Sodium: 133 mmol/L — ABNORMAL LOW (ref 135–145)
TCO2: 34 mmol/L — ABNORMAL HIGH (ref 22–32)
pCO2, Ven: 51.9 mmHg (ref 44.0–60.0)
pH, Ven: 7.402 (ref 7.250–7.430)
pO2, Ven: 33 mmHg (ref 32.0–45.0)

## 2020-03-24 LAB — BASIC METABOLIC PANEL
Anion gap: 12 (ref 5–15)
BUN: 20 mg/dL (ref 8–23)
CO2: 30 mmol/L (ref 22–32)
Calcium: 9.2 mg/dL (ref 8.9–10.3)
Chloride: 90 mmol/L — ABNORMAL LOW (ref 98–111)
Creatinine, Ser: 1.29 mg/dL — ABNORMAL HIGH (ref 0.61–1.24)
GFR calc Af Amer: >60 mL/min (ref >60)
GFR calc non Af Amer: 55 mL/min — ABNORMAL LOW (ref >60)
Glucose, Bld: 109 mg/dL — ABNORMAL HIGH (ref 70–99)
Potassium: 3.7 mmol/L (ref 3.5–5.1)
Sodium: 132 mmol/L — ABNORMAL LOW (ref 135–145)

## 2020-03-24 SURGERY — RIGHT/LEFT HEART CATH AND CORONARY ANGIOGRAPHY
Anesthesia: LOCAL

## 2020-03-24 MED ORDER — MIDODRINE HCL 5 MG PO TABS
5.0000 mg | ORAL_TABLET | Freq: Two times a day (BID) | ORAL | Status: DC
  Administered 2020-03-24 – 2020-03-26 (×6): 5 mg via ORAL
  Filled 2020-03-24 (×6): qty 1

## 2020-03-24 MED ORDER — VERAPAMIL HCL 2.5 MG/ML IV SOLN
INTRAVENOUS | Status: AC
  Filled 2020-03-24: qty 2

## 2020-03-24 MED ORDER — LIDOCAINE HCL (PF) 1 % IJ SOLN
INTRAMUSCULAR | Status: DC | PRN
  Administered 2020-03-24 (×2): 2 mL

## 2020-03-24 MED ORDER — HEPARIN (PORCINE) 25000 UT/250ML-% IV SOLN
1100.0000 [IU]/h | INTRAVENOUS | Status: DC
  Administered 2020-03-25 (×2): 1300 [IU]/h via INTRAVENOUS
  Administered 2020-03-26: 1100 [IU]/h via INTRAVENOUS
  Filled 2020-03-24 (×3): qty 250

## 2020-03-24 MED ORDER — HEPARIN (PORCINE) IN NACL 1000-0.9 UT/500ML-% IV SOLN
INTRAVENOUS | Status: DC | PRN
  Administered 2020-03-24 (×2): 500 mL

## 2020-03-24 MED ORDER — IOHEXOL 350 MG/ML SOLN
INTRAVENOUS | Status: DC | PRN
  Administered 2020-03-24: 40 mL via INTRA_ARTERIAL

## 2020-03-24 MED ORDER — HEPARIN (PORCINE) IN NACL 1000-0.9 UT/500ML-% IV SOLN
INTRAVENOUS | Status: AC
  Filled 2020-03-24: qty 1000

## 2020-03-24 MED ORDER — SODIUM CHLORIDE 0.9 % IV SOLN: INTRAVENOUS | Status: AC

## 2020-03-24 MED ORDER — FENTANYL CITRATE (PF) 100 MCG/2ML IJ SOLN
INTRAMUSCULAR | Status: AC
  Filled 2020-03-24: qty 2

## 2020-03-24 MED ORDER — SODIUM CHLORIDE 0.9% FLUSH: 3.0000 mL | INTRAVENOUS | Status: DC | PRN

## 2020-03-24 MED ORDER — HEPARIN SODIUM (PORCINE) 1000 UNIT/ML IJ SOLN
INTRAMUSCULAR | Status: AC
  Filled 2020-03-24: qty 1

## 2020-03-24 MED ORDER — SODIUM CHLORIDE 0.9 % IV SOLN: 250.0000 mL | INTRAVENOUS | Status: DC | PRN

## 2020-03-24 MED ORDER — HYDRALAZINE HCL 20 MG/ML IJ SOLN: 10.0000 mg | INTRAMUSCULAR | Status: AC | PRN

## 2020-03-24 MED ORDER — FENTANYL CITRATE (PF) 100 MCG/2ML IJ SOLN
INTRAMUSCULAR | Status: DC | PRN
  Administered 2020-03-24: 25 ug via INTRAVENOUS

## 2020-03-24 MED ORDER — MIDAZOLAM HCL 5 MG/5ML IJ SOLN
INTRAMUSCULAR | Status: AC
  Filled 2020-03-24: qty 5

## 2020-03-24 MED ORDER — LABETALOL HCL 5 MG/ML IV SOLN: 10.0000 mg | INTRAVENOUS | Status: AC | PRN

## 2020-03-24 MED ORDER — LIDOCAINE HCL 1 % IJ SOLN
INTRAMUSCULAR | Status: AC
  Filled 2020-03-24: qty 20

## 2020-03-24 MED ORDER — VERAPAMIL HCL 2.5 MG/ML IV SOLN
INTRAVENOUS | Status: DC | PRN
  Administered 2020-03-24: 10 mL via INTRA_ARTERIAL

## 2020-03-24 MED ORDER — HEPARIN SODIUM (PORCINE) 1000 UNIT/ML IJ SOLN
INTRAMUSCULAR | Status: DC | PRN
  Administered 2020-03-24: 4500 [IU] via INTRAVENOUS

## 2020-03-24 MED ORDER — SODIUM CHLORIDE 0.9% FLUSH
3.0000 mL | Freq: Two times a day (BID) | INTRAVENOUS | Status: DC
  Administered 2020-03-26: 3 mL via INTRAVENOUS

## 2020-03-24 MED ORDER — MIDAZOLAM HCL 2 MG/2ML IJ SOLN
INTRAMUSCULAR | Status: DC | PRN
  Administered 2020-03-24: 1 mg via INTRAVENOUS

## 2020-03-24 MED ORDER — FUROSEMIDE 10 MG/ML IJ SOLN
40.0000 mg | Freq: Two times a day (BID) | INTRAMUSCULAR | Status: DC
  Administered 2020-03-24 – 2020-03-26 (×5): 40 mg via INTRAVENOUS
  Filled 2020-03-24 (×5): qty 4

## 2020-03-24 SURGICAL SUPPLY — 11 items

## 2020-03-24 NOTE — Plan of Care (Signed)

## 2020-03-24 NOTE — Progress Notes (Signed)
DAILY PROGRESS NOTE   Patient Name: Lonne Vandemark Sharpnack Date of Encounter: 03/24/2020 Cardiologist: No primary care provider on file.  Chief Complaint   No complaints  Patient Profile   Jeffray Stoney is a 74 y.o. 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 Broadus John  Subjective   No events overnight- plan for [facility] today. BP remains soft.  Labs ok for cath - creatinine 1.29 (was 1.31) - he has a SOLITARY KIDNEY.  Objective   Vitals:   03/23/20 2329 03/24/20 0409 03/24/20 0427 03/24/20 0751  BP: 98/66 (!) 90/59  99/69  Pulse: 87 78  79  Resp:    20  Temp: 98.3 F (36.8 C) 97.6 F (36.4 C)  98.1 F (36.7 C)  TempSrc: Oral Oral  Oral  SpO2: 95% 91%  95%  Weight:   87.6 kg   Height:        Intake/Output Summary (Last 24 hours) at 03/24/2020 0854 Last data filed at 03/24/2020 [id number] Gross per 24 hour  Intake 993 ml  Output 1625 ml  Net -632 ml   Filed Weights   03/22/20 1503 03/23/20 0422 03/24/20 0427  Weight: 88.5 kg 87 kg 87.6 kg    Physical Exam   General appearance: alert and no distress Neck: no carotid bruit, no JVD and thyroid not enlarged, symmetric, no tenderness/mass/nodules Lungs: clear to auscultation bilaterally Heart: regular rate and rhythm Abdomen: soft, non-tender; bowel sounds normal; no masses,  no organomegaly Extremities: extremities normal, atraumatic, no cyanosis or edema Pulses: 2+ and symmetric Skin: Skin color, texture, turgor normal. No rashes or lesions Neurologic: Grossly normal Psych: Pleasant  Inpatient Medications    Scheduled Meds: . aspirin  81 mg Oral Daily  .  chlorhexidine  15 mL Mouth Rinse BID  . feeding supplement (ENSURE ENLIVE)  237 mL Oral Q24H  . lactose free nutrition  237 mL Oral BID BM  . lamoTRIgine  100 mg Oral q AM  . lamoTRIgine  200 mg Oral QPM  . levofloxacin  500 mg Oral Daily  . midodrine  5 mg Oral BID WC  . pantoprazole  40 mg Oral Daily  . simvastatin  20 mg Oral QPM  . sodium chloride flush  3 mL Intravenous Q12H  . sodium chloride flush  3 mL Intravenous Q12H    Continuous Infusions: . sodium chloride    . sodium chloride    . sodium chloride 10 mL/hr at 03/24/20 [id number]    PRN Meds: sodium chloride, sodium chloride, acetaminophen, ipratropium-albuterol, ondansetron (ZOFRAN) IV, polyethylene glycol, sodium chloride flush, sodium chloride flush   Labs   Results for orders placed or performed during the hospital encounter of 03/21/20 (from the past 48 hour(s))  CBC     Status: Abnormal   Collection Time: 03/23/20  4:29 AM  Result Value Ref Range   WBC 7.0 4.0 - 10.5 K/uL   RBC 3.01 (L) 4.22 - 5.81 MIL/uL   Hemoglobin 8.6 (L) 13.0 - 17.0 g/dL   HCT 27.4 (L) 39.0 - 52.0 %   MCV 91.0 80.0 - 100.0 fL   MCH 28.6 26.0 - 34.0  pg   MCHC 31.4 30.0 - 36.0 g/dL   RDW 14.4 11.5 - 15.5 %   Platelets 287 150 - 400 K/uL   nRBC 0.0 0.0 - 0.2 %    Comment: Performed at [facility], [place] [address], [place], [place] [address]  Comprehensive metabolic panel     Status: Abnormal   Collection Time: 03/23/20  4:29 AM  Result Value Ref Range   Sodium 135 135 - 145 mmol/L   Potassium 3.5 3.5 - 5.1 mmol/L   Chloride 92 (L) 98 - 111 mmol/L   CO2 32 22 - 32 mmol/L   Glucose, Bld 103 (H) 70 - 99 mg/dL    Comment: Glucose reference range applies only to samples taken after fasting for at least 8 hours.   BUN 21 8 - 23 mg/dL   Creatinine, Ser 1.31 (H) 0.61 - 1.24 mg/dL   Calcium 9.2 8.9 - 10.3 mg/dL   Total Protein 6.2 (L) 6.5 - 8.1 g/dL   Albumin 3.1 (L) 3.5 - 5.0 g/dL   AST 17 15 - 41 U/L   ALT 11 0 - 44 U/L   Alkaline  Phosphatase 75 38 - 126 U/L   Total Bilirubin 0.9 0.3 - 1.2 mg/dL   GFR calc non Af Amer 54 (L) >60 mL/min   GFR calc Af Amer >60 >60 mL/min   Anion gap 11 5 - 15    Comment: Performed at [facility] [address] [place], [place] [address]  CBC     Status: Abnormal   Collection Time: 03/24/20  3:42 AM  Result Value Ref Range   WBC 6.4 4.0 - 10.5 K/uL   RBC 3.14 (L) 4.22 - 5.81 MIL/uL   Hemoglobin 8.7 (L) 13.0 - 17.0 g/dL   HCT 28.2 (L) 39.0 - 52.0 %   MCV 89.8 80.0 - 100.0 fL   MCH 27.7 26.0 - 34.0 pg   MCHC 30.9 30.0 - 36.0 g/dL   RDW 14.1 11.5 - 15.5 %   Platelets 303 150 - 400 K/uL   nRBC 0.0 0.0 - 0.2 %    Comment: Performed at [facility], [place] [address] [place], [place] [id number]  Basic metabolic panel     Status: Abnormal   Collection Time: 03/24/20  3:42 AM  Result Value Ref Range   Sodium 132 (L) 135 - 145 mmol/L   Potassium 3.7 3.5 - 5.1 mmol/L   Chloride 90 (L) 98 - 111 mmol/L   CO2 30 22 - 32 mmol/L   Glucose, Bld 109 (H) 70 - 99 mg/dL    Comment: Glucose reference range applies only to samples taken after fasting for at least 8 hours.   BUN 20 8 - 23 mg/dL   Creatinine, Ser 1.29 (H) 0.61 - 1.24 mg/dL   Calcium 9.2 8.9 - 10.3 mg/dL   GFR calc non Af Amer 55 (L) >60 mL/min   GFR calc Af Amer >60 >60 mL/min   Anion gap 12 5 - 15    Comment: Performed at [facility] [address] [place], [place] [address]    ECG   Sinus rhythm with anterior infarct (5/29) - Personally Reviewed  Telemetry   Sinus rhythm - Personally Reviewed  Radiology    ECHOCARDIOGRAM COMPLETE  Result Date: 03/22/2020    ECHOCARDIOGRAM REPORT   Patient Name:   ALLARD TIMPERLEY Date of Exam: 03/22/2020 Medical Rec #:  [id number]  Height:       70.0 in Accession #:    [id number]           Weight:       205.0 lb Date of Birth:  11/19/1945            BSA:          2.109 m Patient Age:    74 years             BP:           113/89 mmHg Patient Gender: M                     HR:           85 bpm. Exam Location:  Inpatient Procedure: 2D Echo Indications:    CHF-Acute Systolic [id number] / [id number]  History:        Patient has prior history of Echocardiogram examinations, most                 recent 10/04/2011. CHF, Stroke; Risk Factors:Hypertension,                 Dyslipidemia and Non-Smoker.  Sonographer:    Leavy Cella Referring Phys: [id number] [place]  1. Akinesis of the anteroseptal, apical and basal inferior walls with overall severe LV dysfunction; grade 2 diastolic dysfunction; mild LVE; trace AI.  2. Left ventricular ejection fraction, by estimation, is 25 to 30%. The left ventricle has severely decreased function. The left ventricle demonstrates regional wall motion abnormalities (see scoring diagram/findings for description). The left ventricular internal cavity size was mildly dilated. Left ventricular diastolic parameters are consistent with Grade II diastolic dysfunction (pseudonormalization). Elevated left atrial pressure.  3. Right ventricular systolic function is normal. The right ventricular size is normal. There is mildly elevated pulmonary artery systolic pressure.  4. The mitral valve is normal in structure. No evidence of mitral valve regurgitation. No evidence of mitral stenosis.  5. The aortic valve is normal in structure. Aortic valve regurgitation is trivial. Mild to moderate aortic valve sclerosis/calcification is present, without any evidence of aortic stenosis.  6. The inferior vena cava is normal in size with greater than 50% respiratory variability, suggesting right atrial pressure of 3 mmHg. FINDINGS  Left Ventricle: Left ventricular ejection fraction, by estimation, is 25 to 30%. The left ventricle has severely decreased function. The left ventricle demonstrates regional wall motion abnormalities. The left ventricular internal cavity size was mildly  dilated. There is no left ventricular hypertrophy. Left ventricular  diastolic parameters are consistent with Grade II diastolic dysfunction (pseudonormalization). Elevated left atrial pressure. Right Ventricle: The right ventricular size is normal.Right ventricular systolic function is normal. There is mildly elevated pulmonary artery systolic pressure. The tricuspid regurgitant velocity is 2.88 m/s, and with an assumed right atrial pressure of  3 mmHg, the estimated right ventricular systolic pressure is 0000000 mmHg. Left Atrium: Left atrial size was normal in size. Right Atrium: Right atrial size was normal in size. Pericardium: A small pericardial effusion is present. Mitral Valve: The mitral valve is normal in structure. Normal mobility of the mitral valve leaflets. Mild mitral annular calcification. No evidence of mitral valve regurgitation. No evidence of mitral valve stenosis. Tricuspid Valve: The tricuspid valve is normal in structure. Tricuspid valve regurgitation is mild . No evidence of tricuspid stenosis. Aortic Valve: The aortic valve is normal in structure. Aortic valve regurgitation is trivial. Aortic regurgitation PHT measures 388 msec. Mild  to moderate aortic valve sclerosis/calcification is present, without any evidence of aortic stenosis. Pulmonic Valve: The pulmonic valve was normal in structure. Pulmonic valve regurgitation is mild. No evidence of pulmonic stenosis. Aorta: The aortic root is normal in size and structure. Venous: The inferior vena cava is normal in size with greater than 50% respiratory variability, suggesting right atrial pressure of 3 mmHg. IAS/Shunts: No atrial level shunt detected by color flow Doppler. Additional Comments: Akinesis of the anteroseptal, apical and basal inferior walls with overall severe LV dysfunction; grade 2 diastolic dysfunction; mild LVE; trace AI.  LEFT VENTRICLE PLAX 2D LVIDd:         5.57 cm  Diastology LVIDs:         4.34 cm  LV e' lateral:   5.77 cm/s LV PW:         1.49 cm  LV E/e' lateral: 13.7 LV IVS:        1.05  cm  LV e' medial:    4.46 cm/s LVOT diam:     1.90 cm  LV E/e' medial:  17.8 LVOT Area:     2.84 cm  RIGHT VENTRICLE RV S prime:     11.30 cm/s TAPSE (M-mode): 2.2 cm LEFT ATRIUM           Index       RIGHT ATRIUM           Index LA diam:      3.80 cm 1.80 cm/m  RA Area:     16.80 cm LA Vol (A2C): 57.8 ml 27.40 ml/m RA Volume:   52.90 ml  25.08 ml/m LA Vol (A4C): 54.2 ml 25.69 ml/m  AORTIC VALVE AI PHT:      388 msec  AORTA Ao Root diam: 3.10 cm MITRAL VALVE               TRICUSPID VALVE MV Area (PHT): 4.15 cm    TR Peak grad:   33.2 mmHg MV Decel Time: 183 msec    TR Vmax:        288.00 cm/s MR Peak grad: 44.1 mmHg MR Mean grad: 29.0 mmHg    SHUNTS MR Vmax:      332.00 cm/s  Systemic Diam: 1.90 cm MR Vmean:     258.0 cm/s MV E velocity: 79.30 cm/s MV A velocity: 64.70 cm/s MV E/A ratio:  1.23 Kirk Ruths MD Electronically signed by Kirk Ruths MD Signature Date/Time: 03/22/2020/1:24:26 PM    Final     Cardiac Studies   See above  Assessment   1. Principal Problem: 2.   Acute CHF (congestive heart failure) ([place]) 3. Active Problems: 4.   Essential hypertension 5.   Seizure disorder ([place]) 6.   Mixed hyperlipidemia 7.   Chronic osteomyelitis of mandible 8.   Orthostatic hypotension 9.   GERD without esophagitis 10.   Elevated troponin level not due myocardial infarction 11.   Acute congestive heart failure ([place]) 12.   Plan   1. Plan for [facility] today - has been NPO. Consent signed. He has a solitary kidney, so will need to minimize dye exposure - creatinine 1.29 today. No further questions about cath.  Time Spent Directly with Patient:  I have spent a total of 25 minutes with the patient reviewing hospital notes, telemetry, EKGs, labs and examining the patient as well as establishing an assessment and plan that was discussed personally with the patient.  > 50% of time was spent in direct patient care.  Length  of Stay:  LOS: 3 days   Pixie Casino, MD, [facility], [place] Director of the Advanced Lipid Disorders &  Cardiovascular Risk Reduction Clinic Diplomate of the American Board of Clinical Lipidology Attending Cardiologist  Direct Dial: [phone]  Fax: [phone]  Website:  [email or web address] [email or web address] Alina Gilkey 03/24/2020, 8:54 AM

## 2020-03-24 NOTE — Interval H&P Note (Signed)
History and Physical Interval Note:  03/24/2020 4:19 PM  Connor [name]  has presented today for surgery, with the diagnosis of NSTEMI.  The various methods of treatment have been discussed with the patient and family. After consideration of risks, benefits and other options for treatment, the patient has consented to  Procedure(s): RIGHT/LEFT HEART CATH AND CORONARY ANGIOGRAPHY (N/A) as a surgical intervention.  The patient's history has been reviewed, patient examined, no change in status, stable for surgery.  I have reviewed the patient's chart and labs.  Questions were answered to the patient's satisfaction.    Cath Lab Visit (complete for each Cath Lab visit)  Clinical Evaluation Leading to the Procedure:   ACS: No.  Non-ACS:    Anginal Classification: CCS II  Anti-ischemic medical therapy: No Therapy  Non-Invasive Test Results: No non-invasive testing performed  Prior CABG: No previous CABG        Lauree Chandler

## 2020-03-24 NOTE — Progress Notes (Signed)
Per cath lab RN, the consent as ordered with three potential MDs is appropriate.

## 2020-03-24 NOTE — Progress Notes (Signed)
Pharmacist Heart Failure Core Measure Documentation  Assessment: Connor [name] has an EF documented as 25-30% on 03/22/20 by Echo.  Rationale: Heart failure patients with left ventricular systolic dysfunction (LVSD) and an EF < 40% should be prescribed an angiotensin converting enzyme inhibitor (ACEI) or angiotensin receptor blocker (ARB) at discharge unless a contraindication is documented in the medical record.  This patient is not currently on an ACEI or ARB for HF.  This note is being placed in the record in order to provide documentation that a contraindication to the use of these agents is present for this encounter.  ACE Inhibitor or Angiotensin Receptor Blocker is contraindicated (specify all that apply)  []   ACEI allergy AND ARB allergy []   Angioedema []   Moderate or severe aortic stenosis []   Hyperkalemia [x]   Hypotension []   Renal artery stenosis []   Worsening renal function, preexisting renal disease or dysfunction  Connor [name], PharmD Clinical Pharmacist **Pharmacist phone directory can now be found on [email or web address] ([email or web address]).  Listed under [place].

## 2020-03-24 NOTE — Evaluation (Signed)
Physical Therapy Evaluation Patient Details Name: Connor [name] MRN: [id number] DOB: 1946-10-06 Today's Date: 03/24/2020   History of Present Illness  Patient is a 74 y/o male who presents with SOB. Admitted with new onset of CHF. Also with bilateral pleural effusions. 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  Clinical Impression  Patient presents with generalized weakness, impaired balance and impaired mobility s/p above. Pt lives alone and uses [facility] for ambulation (esp recently) PTA. Reports 2 falls in last 6 months. Has hx of orthostatic hypotension and manages it well at home. Today, pt tolerated bed mobility, transfers and gait training with Min guard assist and use of RW for safety. No dizziness reported and VSS. Supine BP 114/79, sitting BP 114/72, standing BP 135/70, asymptomatic. Sp02 remained >94% on RA. Encouraged mobility while in the hospital. Will follow acutely to maximize independence and mobility prior to return home.    Follow Up Recommendations Home health PT;Supervision - Intermittent(pending improvement)    Equipment Recommendations  None recommended by PT    Recommendations for Other Services       Precautions / Restrictions Precautions Precautions: Fall Precaution Comments: hx of orthostatic hypotension Restrictions Weight Bearing Restrictions: No      Mobility  Bed Mobility Overal bed mobility: Needs Assistance Bed Mobility: Supine to Sit;Sit to Supine     Supine to sit: Modified independent (Device/Increase time);HOB elevated Sit to supine: Modified independent (Device/Increase time);HOB elevated   General bed mobility comments: No assist needed, use of rail.  Transfers Overall transfer level: Needs assistance Equipment used: Rolling walker (2 wheeled) Transfers: Sit to/from Stand Sit to Stand: Min guard         General transfer comment: Min guard for safety. Stood  from [employer].  Ambulation/Gait Ambulation/Gait assistance: Min guard Gait Distance (Feet): 100 Feet Assistive device: Rolling walker (2 wheeled) Gait Pattern/deviations: Step-through pattern;Decreased stride length Gait velocity: decreased   General Gait Details: Slow, guarded and mostly steady gait with RW for support; reports feeling weakness in BLEs. No dizziness. Sp02 >94% on RA.  Stairs            Wheelchair Mobility    Modified Rankin (Stroke Patients Only)       Balance Overall balance assessment: Needs assistance;History of Falls Sitting-balance support: Feet supported;No upper extremity supported Sitting balance-Leahy Scale: Good     Standing balance support: During functional activity Standing balance-Leahy Scale: Poor Standing balance comment: Requires UE support in standing.                             Pertinent Vitals/Pain Pain Assessment: No/denies pain    Home Living Family/patient expects to be discharged to:: Private residence Living Arrangements: Alone Available Help at Discharge: Family;Available PRN/intermittently Type of Home: House Home Access: Level entry     Home Layout: One level Home Equipment: Cane - single point;Walker - 2 wheels;Grab bars - tub/shower;Grab bars - toilet      Prior Function Level of Independence: Independent with assistive device(s)         Comments: Uses SPC, cooks/cleans, drives. 2 falls in last 6 months. Reports hx of imbalance     Hand Dominance   Dominant Hand: Right    Extremity/Trunk Assessment   Upper Extremity Assessment Upper Extremity Assessment: Defer to OT evaluation    Lower Extremity Assessment Lower Extremity Assessment: Generalized weakness  Communication   Communication: HOH  Cognition Arousal/Alertness: Awake/alert Behavior During Therapy: WFL for tasks assessed/performed Overall Cognitive Status: Within Functional Limits for tasks assessed                                         General Comments General comments (skin integrity, edema, etc.): Supine BP 114/79, sitting BP 114/72, standing BP 135/70    Exercises     Assessment/Plan    PT Assessment Patient needs continued PT services  PT Problem List Decreased strength;Decreased mobility;Decreased balance       PT Treatment Interventions Therapeutic activities;Gait training;Therapeutic exercise;Patient/family education;Balance training;Functional mobility training    PT Goals (Current goals can be found in the Care Plan section)  Acute Rehab PT Goals Patient Stated Goal: to get stronger and get home PT Goal Formulation: With patient Time For Goal Achievement: 04/07/20 Potential to Achieve Goals: Good    Frequency Min 3X/week   Barriers to discharge Decreased caregiver support lives alone    Co-evaluation               AM-PAC PT "6 Clicks" Mobility  Outcome Measure Help needed turning from your back to your side while in a flat bed without using bedrails?: None Help needed moving from lying on your back to sitting on the side of a flat bed without using bedrails?: None Help needed moving to and from a bed to a chair (including a wheelchair)?: A Little Help needed standing up from a chair using your arms (e.g., wheelchair or bedside chair)?: A Little Help needed to walk in hospital room?: A Little Help needed climbing 3-5 steps with a railing? : A Little 6 Click Score: 20    End of Session Equipment Utilized During Treatment: Gait belt Activity Tolerance: Patient tolerated treatment well Patient left: in bed;with call bell/phone within reach;with bed alarm set;with family/visitor present Nurse Communication: Mobility status PT Visit Diagnosis: Unsteadiness on feet (R26.81);Muscle weakness (generalized) (M62.81)    Time: [id number] PT Time Calculation (min) (ACUTE ONLY): 23 min   Charges:   PT Evaluation $PT Eval Moderate Complexity: 1 Mod PT  Treatments $Gait Training: 8-22 mins        Marisa Severin, PT, DPT Acute Rehabilitation Services Pager [phone] Office [place] 03/24/2020, 3:04 PM

## 2020-03-24 NOTE — Progress Notes (Addendum)
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 -Patient has a long history of orthostatic hypotension and dizziness, this persisted despite titrating down his antihypertensives, subsequently he was started on fludrocortisone few weeks ago. -Admitted with new onset CHF, echo with severe LV dysfunction, EF of 25%, cardiology consulted, plan for cath today, improved with diuretics   Assessment & Plan:   Acute systolic CHF/new onset -Fluid overloaded,, imaging consistent with bilateral pleural effusions, BNP 3210 -Diuresed with IV Lasix on admission, diuresed well, 6 L negative in 1 day with resultant bump in creatinine, Lasix on hold since yesterday -2D echocardiogram notes severe LV dysfunction, EF of 25% and grade 2 diastolic dysfunction -Appreciate cardiology input -Fludrocortisone stopped on admission -Add low-dose midodrine for soft/low BPs -Ambulate, labs in a.m.  Elevated high-sensitivity troponin -Flat trend, 500-600 range, patient denies any chest pain in the last few weeks with activity or rest -Suspect this is demand ischemia in the setting of fluid overload and heart failure -2D echo notes severe LV dysfunction, could be ischemic -Cardiology consult requested, plan for left, right heart cath today  History of seizure disorder -Continue home regimen of Lamictal  Chronic osteomyelitis of the  mandible -Recent surgery at [facility], followed by bleeding complications -DVT prophylaxis held off -Followed by infectious disease, continue chronic levofloxacin  Orthostatic hypotension -Chronic at least ongoing for 6 to 8 months -Off all antihypertensives, fludrocortisone discontinued in the setting of fluid overload -Compression stockings/TED hose -Start low-dose midodrine  History of metastatic prostate cancer History of squamous cell carcinoma of left tonsil status post XRT and surgeries -Follow-up with oncology, oral surgery at [place] Status:   DNR on admission, now changed his mind to Full Code Family Communication:  Friend at bedside  Status is: Inpatient  Patient is inpatient status due to new onset CHF, ischemic work-up, soft BP  Dispo: The patient is from: Home  Anticipated d/c is to: Home  Anticipated d/c date is: 48 hours  Patient currently is not medically stable to d/c.   Procedures:   Antimicrobials:    Subjective: -Feels okay, n.p.o. for possible cath today, breathing overall remains improved  Objective: Vitals:   03/24/20 0409 03/24/20 0427 03/24/20 0751 03/24/20 1053  BP: (!) 90/59  99/69 105/72  Pulse: 78  79 76  Resp:   20 18  Temp: 97.6 F (36.4 C)  98.1 F (36.7 C) 97.9 F (36.6 C)  TempSrc: Oral  Oral Oral  SpO2: 91%  95% 97%  Weight:  87.6 kg    Height:        Intake/Output Summary (Last 24 hours) at 03/24/2020 1056 Last data filed at 03/24/2020 0223 Gross per 24 hour  Intake 873 ml  Output 1625 ml  Net -752 ml   Filed Weights   03/22/20 1503 03/23/20 0422 03/24/20 0427  Weight: 88.5 kg 87 kg 87.6 kg    Examination:  General exam: Chronically ill elderly male sitting up in bed, AAOx3, no distress HEENT: Deformity  of left mandible, postsurgical changes CVS S1-S2, regular rate rhythm Lungs: Scant basilar rales on the left Abdomen: Soft, nontender, bowel sounds  present Extremities: No edema  Skin: Discoloration of the skin submandibular region Psychiatry:  Mood & affect appropriate.     Data Reviewed:   CBC: Recent Labs  Lab 03/21/20 1636 03/22/20 0403 03/23/20 0429 03/24/20 0342  WBC 6.1 6.1 7.0 6.4  NEUTROABS  --  3.9  --   --   HGB 8.5* 8.4* 8.6* 8.7*  HCT 27.5* 26.6* 27.4* 28.2*  MCV 91.7 91.4 91.0 89.8  PLT 318 275 287 [id number]   Basic Metabolic Panel: Recent Labs  Lab 03/21/20 1636 03/22/20 0403 03/23/20 0429 03/24/20 0342  NA 130* 133* 135 132*  K 4.8 4.4 3.5 3.7  CL 92* 94* 92* 90*  CO2 27 29 32 30  GLUCOSE 132* 114* 103* 109*  BUN 24* 19 21 20   CREATININE 1.11 1.17 1.31* 1.29*  CALCIUM 9.2 9.1 9.2 9.2  MG  --  1.9  --   --    GFR: Estimated Creatinine Clearance: 57.7 mL/min (A) (by C-G formula based on SCr of 1.29 mg/dL (H)). Liver Function Tests: Recent Labs  Lab 03/21/20 1730 03/22/20 0403 03/23/20 0429  AST 21 19 17   ALT 13 12 11   ALKPHOS 84 78 75  BILITOT 0.4 0.6 0.9  PROT 6.8 6.1* 6.2*  ALBUMIN 3.6 3.2* 3.1*   No results for input(s): LIPASE, AMYLASE in the last 168 hours. No results for input(s): AMMONIA in the last 168 hours. Coagulation Profile: No results for input(s): INR, PROTIME in the last 168 hours. Cardiac Enzymes: No results for input(s): CKTOTAL, CKMB, CKMBINDEX, TROPONINI in the last 168 hours. BNP (last 3 results) No results for input(s): PROBNP in the last 8760 hours. HbA1C: No results for input(s): HGBA1C in the last 72 hours. CBG: No results for input(s): GLUCAP in the last 168 hours. Lipid Profile: No results for input(s): CHOL, HDL, LDLCALC, TRIG, CHOLHDL, LDLDIRECT in the last 72 hours. Thyroid Function Tests: No results for input(s): TSH, T4TOTAL, FREET4, T3FREE, THYROIDAB in the last 72 hours. Anemia Panel: Recent Labs    03/21/20 2200  VITAMINB12 747  FOLATE 28.1  TIBC 444  IRON 16*   Urine analysis:    Component Value Date/Time   COLORURINE YELLOW 03/21/2020  2205   APPEARANCEUR CLEAR 03/21/2020 2205   LABSPEC 1.015 03/21/2020 2205   PHURINE 7.0 03/21/2020 2205   GLUCOSEU NEGATIVE 03/21/2020 2205   HGBUR NEGATIVE 03/21/2020 [place] 03/21/2020 2205   [place] 03/21/2020 2205   PROTEINUR 30 (A) 03/21/2020 2205   UROBILINOGEN 0.2 02/17/2014 1351   NITRITE NEGATIVE 03/21/2020 2205   LEUKOCYTESUR NEGATIVE 03/21/2020 2205   Sepsis Labs: @LABRCNTIP (procalcitonin:4,lacticidven:4)  ) Recent Results (from the past 240 hour(s))  SARS Coronavirus 2 by RT PCR (hospital order, performed in [place] hospital lab) Nasopharyngeal Nasopharyngeal Swab     Status: None   Collection Time: 03/21/20  7:08 PM   Specimen: Nasopharyngeal Swab  Result Value Ref Range Status   SARS Coronavirus 2 NEGATIVE NEGATIVE Final    Comment: (NOTE) SARS-CoV-2 target nucleic acids are NOT DETECTED. The SARS-CoV-2 RNA is generally detectable in upper and lower respiratory specimens during the acute phase of infection. The lowest concentration of SARS-CoV-2 viral copies this assay can detect is 250 copies / mL. A negative result does not preclude SARS-CoV-2 infection and should not be used as the sole basis for treatment or  other patient management decisions.  A negative result may occur with improper specimen collection / handling, submission of specimen other than nasopharyngeal swab, presence of viral mutation(s) within the areas targeted by this assay, and inadequate number of viral copies (<250 copies / mL). A negative result must be combined with clinical observations, patient history, and epidemiological information. Fact Sheet for Patients:   StrictlyIdeas.no Fact Sheet for Healthcare Providers: BankingDealers.co.za This test is not yet approved or cleared  by the [place] FDA and has been authorized for detection and/or diagnosis of SARS-CoV-2 by FDA under an Emergency Use  Authorization (EUA).  This EUA will remain in effect (meaning this test can be used) for the duration of the COVID-19 declaration under Section 564(b)(1) of the Act, 21 U.S.C. section 360bbb-3(b)(1), unless the authorization is terminated or revoked sooner. Performed at [facility], [place] [address] [place], [place] [address]   Culture, blood (routine x 2)     Status: None (Preliminary result)   Collection Time: 03/21/20 10:15 PM   Specimen: BLOOD RIGHT HAND  Result Value Ref Range Status   Specimen Description BLOOD RIGHT HAND  Final   Special Requests   Final    BOTTLES DRAWN AEROBIC AND ANAEROBIC Blood Culture results may not be optimal due to an inadequate volume of blood received in culture bottles   Culture   Final    NO GROWTH 3 DAYS Performed at [facility], [place]  [address] [place], [place] [address]    Report Status PENDING  Incomplete  Culture, blood (routine x 2)     Status: None (Preliminary result)   Collection Time: 03/21/20 10:21 PM   Specimen: BLOOD  Result Value Ref Range Status   Specimen Description BLOOD RIGHT ARM  Final   Special Requests   Final    BOTTLES DRAWN AEROBIC AND ANAEROBIC Blood Culture results may not be optimal due to an inadequate volume of blood received in culture bottles   Culture   Final    NO GROWTH 3 DAYS Performed at [facility], [place] [address] [place], [place] [address]    Report Status PENDING  Incomplete         Radiology Studies: ECHOCARDIOGRAM COMPLETE  Result Date: 03/22/2020    ECHOCARDIOGRAM REPORT   Patient Name:   JAIMIN PLITT Date of Exam: 03/22/2020 Medical Rec #:  [id number]            Height:       70.0 in Accession #:    [id number]           Weight:       205.0 lb Date of Birth:  1946/04/20            BSA:          2.109 m Patient Age:    38 years             BP:           113/89 mmHg Patient Gender: M                    HR:           85 bpm. Exam Location:  Inpatient Procedure: 2D Echo  Indications:    CHF-Acute Systolic [id number] / [id number]  History:        Patient has prior history of Echocardiogram examinations, most  recent 10/04/2011. CHF, Stroke; Risk Factors:Hypertension,                 Dyslipidemia and Non-Smoker.  Sonographer:    Leavy Cella Referring Phys: [id number] [place]  1. Akinesis of the anteroseptal, apical and basal inferior walls with overall severe LV dysfunction; grade 2 diastolic dysfunction; mild LVE; trace AI.  2. Left ventricular ejection fraction, by estimation, is 25 to 30%. The left ventricle has severely decreased function. The left ventricle demonstrates regional wall motion abnormalities (see scoring diagram/findings for description). The left ventricular internal cavity size was mildly dilated. Left ventricular diastolic parameters are consistent with Grade II diastolic dysfunction (pseudonormalization). Elevated left atrial pressure.  3. Right ventricular systolic function is normal. The right ventricular size is normal. There is mildly elevated pulmonary artery systolic pressure.  4. The mitral valve is normal in structure. No evidence of mitral valve regurgitation. No evidence of mitral stenosis.  5. The aortic valve is normal in structure. Aortic valve regurgitation is trivial. Mild to moderate aortic valve sclerosis/calcification is present, without any evidence of aortic stenosis.  6. The inferior vena cava is normal in size with greater than 50% respiratory variability, suggesting right atrial pressure of 3 mmHg. FINDINGS  Left Ventricle: Left ventricular ejection fraction, by estimation, is 25 to 30%. The left ventricle has severely decreased function. The left ventricle demonstrates regional wall motion abnormalities. The left ventricular internal cavity size was mildly  dilated. There is no left ventricular hypertrophy. Left ventricular diastolic parameters are consistent with Grade II diastolic dysfunction  (pseudonormalization). Elevated left atrial pressure. Right Ventricle: The right ventricular size is normal.Right ventricular systolic function is normal. There is mildly elevated pulmonary artery systolic pressure. The tricuspid regurgitant velocity is 2.88 m/s, and with an assumed right atrial pressure of  3 mmHg, the estimated right ventricular systolic pressure is 0000000 mmHg. Left Atrium: Left atrial size was normal in size. Right Atrium: Right atrial size was normal in size. Pericardium: A small pericardial effusion is present. Mitral Valve: The mitral valve is normal in structure. Normal mobility of the mitral valve leaflets. Mild mitral annular calcification. No evidence of mitral valve regurgitation. No evidence of mitral valve stenosis. Tricuspid Valve: The tricuspid valve is normal in structure. Tricuspid valve regurgitation is mild . No evidence of tricuspid stenosis. Aortic Valve: The aortic valve is normal in structure. Aortic valve regurgitation is trivial. Aortic regurgitation PHT measures 388 msec. Mild to moderate aortic valve sclerosis/calcification is present, without any evidence of aortic stenosis. Pulmonic Valve: The pulmonic valve was normal in structure. Pulmonic valve regurgitation is mild. No evidence of pulmonic stenosis. Aorta: The aortic root is normal in size and structure. Venous: The inferior vena cava is normal in size with greater than 50% respiratory variability, suggesting right atrial pressure of 3 mmHg. IAS/Shunts: No atrial level shunt detected by color flow Doppler. Additional Comments: Akinesis of the anteroseptal, apical and basal inferior walls with overall severe LV dysfunction; grade 2 diastolic dysfunction; mild LVE; trace AI.  LEFT VENTRICLE PLAX 2D LVIDd:         5.57 cm  Diastology LVIDs:         4.34 cm  LV e' lateral:   5.77 cm/s LV PW:         1.49 cm  LV E/e' lateral: 13.7 LV IVS:        1.05 cm  LV e' medial:    4.46 cm/s LVOT diam:     1.90  cm  LV E/e' medial:   17.8 LVOT Area:     2.84 cm  RIGHT VENTRICLE RV S prime:     11.30 cm/s TAPSE (M-mode): 2.2 cm LEFT ATRIUM           Index       RIGHT ATRIUM           Index LA diam:      3.80 cm 1.80 cm/m  RA Area:     16.80 cm LA Vol (A2C): 57.8 ml 27.40 ml/m RA Volume:   52.90 ml  25.08 ml/m LA Vol (A4C): 54.2 ml 25.69 ml/m  AORTIC VALVE AI PHT:      388 msec  AORTA Ao Root diam: 3.10 cm MITRAL VALVE               TRICUSPID VALVE MV Area (PHT): 4.15 cm    TR Peak grad:   33.2 mmHg MV Decel Time: 183 msec    TR Vmax:        288.00 cm/s MR Peak grad: 44.1 mmHg MR Mean grad: 29.0 mmHg    SHUNTS MR Vmax:      332.00 cm/s  Systemic Diam: 1.90 cm MR Vmean:     258.0 cm/s MV E velocity: 79.30 cm/s MV A velocity: 64.70 cm/s MV E/A ratio:  1.23 Kirk Ruths MD Electronically signed by Kirk Ruths MD Signature Date/Time: 03/22/2020/1:24:26 PM    Final    Scheduled Meds: . aspirin  81 mg Oral Daily  . chlorhexidine  15 mL Mouth Rinse BID  . feeding supplement (ENSURE ENLIVE)  237 mL Oral Q24H  . lactose free nutrition  237 mL Oral BID BM  . lamoTRIgine  100 mg Oral q AM  . lamoTRIgine  200 mg Oral QPM  . levofloxacin  500 mg Oral Daily  . midodrine  5 mg Oral BID WC  . pantoprazole  40 mg Oral Daily  . simvastatin  20 mg Oral QPM  . sodium chloride flush  3 mL Intravenous Q12H  . sodium chloride flush  3 mL Intravenous Q12H   Continuous Infusions: . sodium chloride    . sodium chloride    . sodium chloride 10 mL/hr at 03/24/20 0621     LOS: 3 days    Time spent: 68min  Domenic Polite, MD [facility] 03/24/2020, 10:56 AM

## 2020-03-24 NOTE — Progress Notes (Signed)
Patient off the unit via bed to cath lab. Patient's primary contact Connor [name] (friend) at bedside. Patient gave glasses to [place] prior to going to cath lab.

## 2020-03-24 NOTE — H&P (View-Only) (Signed)
DAILY PROGRESS NOTE   Patient Name: Connor [name] Date of Encounter: 03/24/2020 Cardiologist: No primary care provider on file.  Chief Complaint   No complaints  Patient Profile   Taje Eads is a 74 y.o. 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 Broadus John  Subjective   No events overnight- plan for [facility] today. BP remains soft.  Labs ok for cath - creatinine 1.29 (was 1.31) - he has a SOLITARY KIDNEY.  Objective   Vitals:   03/23/20 2329 03/24/20 0409 03/24/20 0427 03/24/20 0751  BP: 98/66 (!) 90/59  99/69  Pulse: 87 78  79  Resp:    20  Temp: 98.3 F (36.8 C) 97.6 F (36.4 C)  98.1 F (36.7 C)  TempSrc: Oral Oral  Oral  SpO2: 95% 91%  95%  Weight:   87.6 kg   Height:        Intake/Output Summary (Last 24 hours) at 03/24/2020 0854 Last data filed at 03/24/2020 [id number] Gross per 24 hour  Intake 993 ml  Output 1625 ml  Net -632 ml   Filed Weights   03/22/20 1503 03/23/20 0422 03/24/20 0427  Weight: 88.5 kg 87 kg 87.6 kg    Physical Exam   General appearance: alert and no distress Neck: no carotid bruit, no JVD and thyroid not enlarged, symmetric, no tenderness/mass/nodules Lungs: clear to auscultation bilaterally Heart: regular rate and rhythm Abdomen: soft, non-tender; bowel sounds normal; no masses,  no organomegaly Extremities: extremities normal, atraumatic, no cyanosis or edema Pulses: 2+ and symmetric Skin: Skin color, texture, turgor normal. No rashes or lesions Neurologic: Grossly normal Psych: Pleasant  Inpatient Medications    Scheduled Meds: . aspirin  81 mg Oral Daily  .  chlorhexidine  15 mL Mouth Rinse BID  . feeding supplement (ENSURE ENLIVE)  237 mL Oral Q24H  . lactose free nutrition  237 mL Oral BID BM  . lamoTRIgine  100 mg Oral q AM  . lamoTRIgine  200 mg Oral QPM  . levofloxacin  500 mg Oral Daily  . midodrine  5 mg Oral BID WC  . pantoprazole  40 mg Oral Daily  . simvastatin  20 mg Oral QPM  . sodium chloride flush  3 mL Intravenous Q12H  . sodium chloride flush  3 mL Intravenous Q12H    Continuous Infusions: . sodium chloride    . sodium chloride    . sodium chloride 10 mL/hr at 03/24/20 [id number]    PRN Meds: sodium chloride, sodium chloride, acetaminophen, ipratropium-albuterol, ondansetron (ZOFRAN) IV, polyethylene glycol, sodium chloride flush, sodium chloride flush   Labs   Results for orders placed or performed during the hospital encounter of 03/21/20 (from the past 48 hour(s))  CBC     Status: Abnormal   Collection Time: 03/23/20  4:29 AM  Result Value Ref Range   WBC 7.0 4.0 - 10.5 K/uL   RBC 3.01 (L) 4.22 - 5.81 MIL/uL   Hemoglobin 8.6 (L) 13.0 - 17.0 g/dL   HCT 27.4 (L) 39.0 - 52.0 %   MCV 91.0 80.0 - 100.0 fL   MCH 28.6 26.0 - 34.0  pg   MCHC 31.4 30.0 - 36.0 g/dL   RDW 14.4 11.5 - 15.5 %   Platelets 287 150 - 400 K/uL   nRBC 0.0 0.0 - 0.2 %    Comment: Performed at [facility], [place] [address] [place], [place] [address]  Comprehensive metabolic panel     Status: Abnormal   Collection Time: 03/23/20  4:29 AM  Result Value Ref Range   Sodium 135 135 - 145 mmol/L   Potassium 3.5 3.5 - 5.1 mmol/L   Chloride 92 (L) 98 - 111 mmol/L   CO2 32 22 - 32 mmol/L   Glucose, Bld 103 (H) 70 - 99 mg/dL    Comment: Glucose reference range applies only to samples taken after fasting for at least 8 hours.   BUN 21 8 - 23 mg/dL   Creatinine, Ser 1.31 (H) 0.61 - 1.24 mg/dL   Calcium 9.2 8.9 - 10.3 mg/dL   Total Protein 6.2 (L) 6.5 - 8.1 g/dL   Albumin 3.1 (L) 3.5 - 5.0 g/dL   AST 17 15 - 41 U/L   ALT 11 0 - 44 U/L   Alkaline  Phosphatase 75 38 - 126 U/L   Total Bilirubin 0.9 0.3 - 1.2 mg/dL   GFR calc non Af Amer 54 (L) >60 mL/min   GFR calc Af Amer >60 >60 mL/min   Anion gap 11 5 - 15    Comment: Performed at [facility] [address] [place], [place] [address]  CBC     Status: Abnormal   Collection Time: 03/24/20  3:42 AM  Result Value Ref Range   WBC 6.4 4.0 - 10.5 K/uL   RBC 3.14 (L) 4.22 - 5.81 MIL/uL   Hemoglobin 8.7 (L) 13.0 - 17.0 g/dL   HCT 28.2 (L) 39.0 - 52.0 %   MCV 89.8 80.0 - 100.0 fL   MCH 27.7 26.0 - 34.0 pg   MCHC 30.9 30.0 - 36.0 g/dL   RDW 14.1 11.5 - 15.5 %   Platelets 303 150 - 400 K/uL   nRBC 0.0 0.0 - 0.2 %    Comment: Performed at [facility], [place] [address] [place], [place] [id number]  Basic metabolic panel     Status: Abnormal   Collection Time: 03/24/20  3:42 AM  Result Value Ref Range   Sodium 132 (L) 135 - 145 mmol/L   Potassium 3.7 3.5 - 5.1 mmol/L   Chloride 90 (L) 98 - 111 mmol/L   CO2 30 22 - 32 mmol/L   Glucose, Bld 109 (H) 70 - 99 mg/dL    Comment: Glucose reference range applies only to samples taken after fasting for at least 8 hours.   BUN 20 8 - 23 mg/dL   Creatinine, Ser 1.29 (H) 0.61 - 1.24 mg/dL   Calcium 9.2 8.9 - 10.3 mg/dL   GFR calc non Af Amer 55 (L) >60 mL/min   GFR calc Af Amer >60 >60 mL/min   Anion gap 12 5 - 15    Comment: Performed at [facility] [address], [place], [place] [address]    ECG   Sinus rhythm with anterior infarct (5/29) - Personally Reviewed  Telemetry   Sinus rhythm - Personally Reviewed  Radiology    ECHOCARDIOGRAM COMPLETE  Result Date: 03/22/2020    ECHOCARDIOGRAM REPORT   Patient Name:   ROSEMARY STAHLBERG Date of Exam: 03/22/2020 Medical Rec #:  [id number]  Height:       70.0 in Accession #:    [id number]           Weight:       205.0 lb Date of Birth:  [birth year]            BSA:          2.109 m Patient Age:    73 years             BP:           113/89 mmHg Patient Gender: M                     HR:           85 bpm. Exam Location:  Inpatient Procedure: 2D Echo Indications:    CHF-Acute Systolic [id number] / [id number]  History:        Patient has prior history of Echocardiogram examinations, most                 recent 10/04/2011. CHF, Stroke; Risk Factors:Hypertension,                 Dyslipidemia and Non-Smoker.  Sonographer:    Leavy Cella Referring Phys: [id number] [place]  1. Akinesis of the anteroseptal, apical and basal inferior walls with overall severe LV dysfunction; grade 2 diastolic dysfunction; mild LVE; trace AI.  2. Left ventricular ejection fraction, by estimation, is 25 to 30%. The left ventricle has severely decreased function. The left ventricle demonstrates regional wall motion abnormalities (see scoring diagram/findings for description). The left ventricular internal cavity size was mildly dilated. Left ventricular diastolic parameters are consistent with Grade II diastolic dysfunction (pseudonormalization). Elevated left atrial pressure.  3. Right ventricular systolic function is normal. The right ventricular size is normal. There is mildly elevated pulmonary artery systolic pressure.  4. The mitral valve is normal in structure. No evidence of mitral valve regurgitation. No evidence of mitral stenosis.  5. The aortic valve is normal in structure. Aortic valve regurgitation is trivial. Mild to moderate aortic valve sclerosis/calcification is present, without any evidence of aortic stenosis.  6. The inferior vena cava is normal in size with greater than 50% respiratory variability, suggesting right atrial pressure of 3 mmHg. FINDINGS  Left Ventricle: Left ventricular ejection fraction, by estimation, is 25 to 30%. The left ventricle has severely decreased function. The left ventricle demonstrates regional wall motion abnormalities. The left ventricular internal cavity size was mildly  dilated. There is no left ventricular hypertrophy. Left ventricular  diastolic parameters are consistent with Grade II diastolic dysfunction (pseudonormalization). Elevated left atrial pressure. Right Ventricle: The right ventricular size is normal.Right ventricular systolic function is normal. There is mildly elevated pulmonary artery systolic pressure. The tricuspid regurgitant velocity is 2.88 m/s, and with an assumed right atrial pressure of  3 mmHg, the estimated right ventricular systolic pressure is 0000000 mmHg. Left Atrium: Left atrial size was normal in size. Right Atrium: Right atrial size was normal in size. Pericardium: A small pericardial effusion is present. Mitral Valve: The mitral valve is normal in structure. Normal mobility of the mitral valve leaflets. Mild mitral annular calcification. No evidence of mitral valve regurgitation. No evidence of mitral valve stenosis. Tricuspid Valve: The tricuspid valve is normal in structure. Tricuspid valve regurgitation is mild . No evidence of tricuspid stenosis. Aortic Valve: The aortic valve is normal in structure. Aortic valve regurgitation is trivial. Aortic regurgitation PHT measures 388 msec. Mild  to moderate aortic valve sclerosis/calcification is present, without any evidence of aortic stenosis. Pulmonic Valve: The pulmonic valve was normal in structure. Pulmonic valve regurgitation is mild. No evidence of pulmonic stenosis. Aorta: The aortic root is normal in size and structure. Venous: The inferior vena cava is normal in size with greater than 50% respiratory variability, suggesting right atrial pressure of 3 mmHg. IAS/Shunts: No atrial level shunt detected by color flow Doppler. Additional Comments: Akinesis of the anteroseptal, apical and basal inferior walls with overall severe LV dysfunction; grade 2 diastolic dysfunction; mild LVE; trace AI.  LEFT VENTRICLE PLAX 2D LVIDd:         5.57 cm  Diastology LVIDs:         4.34 cm  LV e' lateral:   5.77 cm/s LV PW:         1.49 cm  LV E/e' lateral: 13.7 LV IVS:        1.05  cm  LV e' medial:    4.46 cm/s LVOT diam:     1.90 cm  LV E/e' medial:  17.8 LVOT Area:     2.84 cm  RIGHT VENTRICLE RV S prime:     11.30 cm/s TAPSE (M-mode): 2.2 cm LEFT ATRIUM           Index       RIGHT ATRIUM           Index LA diam:      3.80 cm 1.80 cm/m  RA Area:     16.80 cm LA Vol (A2C): 57.8 ml 27.40 ml/m RA Volume:   52.90 ml  25.08 ml/m LA Vol (A4C): 54.2 ml 25.69 ml/m  AORTIC VALVE AI PHT:      388 msec  AORTA Ao Root diam: 3.10 cm MITRAL VALVE               TRICUSPID VALVE MV Area (PHT): 4.15 cm    TR Peak grad:   33.2 mmHg MV Decel Time: 183 msec    TR Vmax:        288.00 cm/s MR Peak grad: 44.1 mmHg MR Mean grad: 29.0 mmHg    SHUNTS MR Vmax:      332.00 cm/s  Systemic Diam: 1.90 cm MR Vmean:     258.0 cm/s MV E velocity: 79.30 cm/s MV A velocity: 64.70 cm/s MV E/A ratio:  1.23 Kirk Ruths MD Electronically signed by Kirk Ruths MD Signature Date/Time: 03/22/2020/1:24:26 PM    Final     Cardiac Studies   See above  Assessment   1. Principal Problem: 2.   Acute CHF (congestive heart failure) ([place]) 3. Active Problems: 4.   Essential hypertension 5.   Seizure disorder ([place]) 6.   Mixed hyperlipidemia 7.   Chronic osteomyelitis of mandible 8.   Orthostatic hypotension 9.   GERD without esophagitis 10.   Elevated troponin level not due myocardial infarction 11.   Acute congestive heart failure ([place]) 12.   Plan   1. Plan for [facility] today - has been NPO. Consent signed. He has a solitary kidney, so will need to minimize dye exposure - creatinine 1.29 today. No further questions about cath.  Time Spent Directly with Patient:  I have spent a total of 25 minutes with the patient reviewing hospital notes, telemetry, EKGs, labs and examining the patient as well as establishing an assessment and plan that was discussed personally with the patient.  > 50% of time was spent in direct patient care.  Length  of Stay:  LOS: 3 days   Pixie Casino, MD, [facility], [place] Director of the [facility] Lip[facility]  Card[facility] Risk Reduction [facility] Diplomate of the American Board of Clinical Lipidology Attending Cardiologist  Direct Dial: [phone]  Fax: [phone]  Website:  [email or web address] Osgood Murrell [place] 03/24/2020, 8:54 AM

## 2020-03-24 NOTE — Progress Notes (Signed)
ANTICOAGULATION CONSULT NOTE - Initial Consult  Pharmacy Consult for heparin Indication: CAD  Allergies  Allergen Reactions  . Morphine And Related Nausea And Vomiting    Patient Measurements: Height: 5\' 10"  (177.8 cm) Weight: 87.6 kg (193 lb 3.2 oz) IBW/kg (Calculated) : 73 Heparin Dosing Weight: 88 kg  Vital Signs: Temp: 98.1 F (36.7 C) (06/01 1700) Temp Source: Oral (06/01 1700) BP: 93/56 (06/01 1827) Pulse Rate: 86 (06/01 1827)  Labs: Recent Labs    03/21/20 1908 03/22/20 0100 03/22/20 0403 03/22/20 0403 03/22/20 [id number] 03/23/20 0429 03/23/20 0429 03/24/20 0342 03/24/20 0342 03/24/20 1633 03/24/20 1634  HGB  --   --  8.4*   < >  --  8.6*   < > 8.7*   < > 9.5* 9.5*  HCT  --   --  26.6*   < >  --  27.4*   < > 28.2*  --  28.0* 28.0*  PLT  --   --  275  --   --  287  --  303  --   --   --   CREATININE  --   --  1.17  --   --  1.31*  --  1.29*  --   --   --   TROPONINIHS 569* 613*  --   --  620*  --   --   --   --   --   --    < > = values in this interval not displayed.    Estimated Creatinine Clearance: 57.7 mL/min (A) (by C-G formula based on SCr of 1.29 mg/dL (H)).   Medical History: Past Medical History:  Diagnosis Date  . Arthritis    OA AND PAIN RT KNEE  . Cancer ([place])    h/o neck - ABOUT 6 YRS AGO - TX'D WITH RADIATION AND CHEMO   . Chronic kidney disease   . GERD without esophagitis 03/21/2020  . Head and neck cancer ~ 2009   S/P radiation & [name]  . Headache(784.0)   . Hyperlipidemia   . Hypertension   . Kidney carcinoma ([place])    h/o - NEPHRECTOMY   . 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 Prostate cancer ([place]) 05/20/14   Gleason 4+3=7, volume 25 gm  . Radiation 2015   hx of, prostate cancer  . Seizures ([place])    hx of x yrs, "the bad kind;bite tongue; STATES LAST [place] 2013; WAS SEEING [name] - HE RETIRED AND PT LAST SAW DR. [name]  . Stroke ([place]) [place] 2013   UNABLE TO  SPEAK OR MOVE AND RT SIDE WEAKNESS AND LOSS OF SKIN SENSITIVITY TO HEAT AND COLD ON RT SIDE, DOUBLE VISION. BALANCE PROBLEMS---STATES STILL HAS DOUBLE VISION AND BALANCE PROBLEM AND RT SIDED LOSS OF SKIN SENSITIVITY    Medications:  Medications Prior to Admission  Medication Sig Dispense Refill Last Dose  . albuterol (VENTOLIN HFA) 108 (90 Base) MCG/ACT inhaler Inhale 2 puffs into the lungs in the morning, at noon, and at bedtime.   03/21/2020 at Unknown time  . aspirin 81 MG tablet Take 81 mg by mouth daily.   03/21/2020 at Unknown time  . Cyanocobalamin (VITAMIN B-12 PO) Take 1 tablet by mouth daily.   03/21/2020 at Unknown time  . ferrous sulfate 325 (65 FE) MG tablet Take 325 mg by mouth 2 (two) times daily with a meal.   03/21/2020 at  Unknown time  . fluconazole (DIFLUCAN) 200 MG tablet Take 2 tablets (400 mg total) by mouth daily. 60 tablet 2 03/21/2020 at Unknown time  . fludrocortisone (FLORINEF) 0.1 MG tablet Take 100 mcg by mouth daily.   03/21/2020 at Unknown time  . lamoTRIgine (LAMICTAL) 100 MG tablet 100mg  in AM and 200mg  in PM (Patient taking differently: Take 100-200 mg by mouth See admin instructions. Take 1 tablet every morning and take 2 tablets every evening) 270 tablet 4 03/21/2020 at Unknown time  . Leuprolide Acetate, 3 Month, (LUPRON DEPOT, 64-MONTH, IM) Inject 1 each into the muscle every 3 (three) months.   Past Month at Unknown time  . levofloxacin (LEVAQUIN) 500 MG tablet Take 1 tablet (500 mg total) by mouth daily. (Patient taking differently: Take 500 mg by mouth every evening. ) 30 tablet 0 03/20/2020 at Unknown time  . simvastatin (ZOCOR) 20 MG tablet Take 20 mg by mouth every evening.   03/20/2020 at Unknown time   Scheduled:  . aspirin  81 mg Oral Daily  . chlorhexidine  15 mL Mouth Rinse BID  . feeding supplement (ENSURE ENLIVE)  237 mL Oral Q24H  . furosemide  40 mg Intravenous Q12H  . lactose free nutrition  237 mL Oral BID BM  . lamoTRIgine  100 mg Oral q AM  .  lamoTRIgine  200 mg Oral QPM  . levofloxacin  500 mg Oral Daily  . midodrine  5 mg Oral BID WC  . pantoprazole  40 mg Oral Daily  . simvastatin  20 mg Oral QPM  . sodium chloride flush  3 mL Intravenous Q12H  . sodium chloride flush  3 mL Intravenous Q12H  . sodium chloride flush  3 mL Intravenous Q12H   Infusions:  . sodium chloride    . sodium chloride 50 mL/hr at 03/24/20 1813  . sodium chloride    . [START ON 03/25/2020] heparin      Assessment: Pt is s/p cath. He has 3VCAD. Plan for CVTS consult for CABG. Heparin has been ordered to start 8 hrs post sheath removal. It was removed at 1645 today.   Goal of Therapy:  Heparin level 0.3-0.7 units/ml Monitor platelets by anticoagulation protocol: Yes   Plan:  No bolus Heparin 1300 units/hr start at 0045 8 hr HL  Daily HL and CBC  Onnie Boer, PharmD, BCIDP, AAHIVP, CPP Infectious Disease Pharmacist 03/24/2020 6:43 PM

## 2020-03-25 ENCOUNTER — Encounter (HOSPITAL_COMMUNITY): Payer: Self-pay | Admitting: Anesthesiology

## 2020-03-25 ENCOUNTER — Inpatient Hospital Stay (HOSPITAL_COMMUNITY): Payer: PPO

## 2020-03-25 ENCOUNTER — Encounter (HOSPITAL_COMMUNITY): Payer: PPO

## 2020-03-25 DIAGNOSIS — I509 Heart failure, unspecified: Secondary | ICD-10-CM

## 2020-03-25 DIAGNOSIS — I255 Ischemic cardiomyopathy: Secondary | ICD-10-CM

## 2020-03-25 DIAGNOSIS — I25110 Atherosclerotic heart disease of native coronary artery with unstable angina pectoris: Secondary | ICD-10-CM

## 2020-03-25 LAB — PULMONARY FUNCTION TEST
FEF 25-75 Pre: 1.81 L/sec
FEF2575-%Pred-Pre: 76 %
FEV1-%Pred-Pre: 52 %
FEV1-Pre: 1.66 L
FEV1FVC-%Pred-Pre: 111 %
FEV6-%Pred-Pre: 49 %
FEV6-Pre: 2.02 L
FEV6FVC-%Pred-Pre: 105 %
FVC-%Pred-Pre: 46 %
FVC-Pre: 2.02 L
Pre FEV1/FVC ratio: 82 %
Pre FEV6/FVC Ratio: 100 %

## 2020-03-25 LAB — CBC
HCT: 27.7 % — ABNORMAL LOW (ref 39.0–52.0)
Hemoglobin: 8.7 g/dL — ABNORMAL LOW (ref 13.0–17.0)
MCH: 28.1 pg (ref 26.0–34.0)
MCHC: 31.4 g/dL (ref 30.0–36.0)
MCV: 89.4 fL (ref 80.0–100.0)
Platelets: 320 10*3/uL (ref 150–400)
RBC: 3.1 MIL/uL — ABNORMAL LOW (ref 4.22–5.81)
RDW: 14.4 % (ref 11.5–15.5)
WBC: 6.8 10*3/uL (ref 4.0–10.5)
nRBC: 0 % (ref 0.0–0.2)

## 2020-03-25 LAB — HEPARIN LEVEL (UNFRACTIONATED)
Heparin Unfractionated: 0.45 IU/mL (ref 0.30–0.70)
Heparin Unfractionated: 0.58 IU/mL (ref 0.30–0.70)

## 2020-03-25 LAB — URINALYSIS, ROUTINE W REFLEX MICROSCOPIC
Bilirubin Urine: NEGATIVE
Glucose, UA: NEGATIVE mg/dL
Hgb urine dipstick: NEGATIVE
Ketones, ur: NEGATIVE mg/dL
Leukocytes,Ua: NEGATIVE
Nitrite: NEGATIVE
Protein, ur: NEGATIVE mg/dL
Specific Gravity, Urine: 1.009 (ref 1.005–1.030)
pH: 7 (ref 5.0–8.0)

## 2020-03-25 LAB — BASIC METABOLIC PANEL
Anion gap: 13 (ref 5–15)
BUN: 20 mg/dL (ref 8–23)
CO2: 29 mmol/L (ref 22–32)
Calcium: 9.1 mg/dL (ref 8.9–10.3)
Chloride: 90 mmol/L — ABNORMAL LOW (ref 98–111)
Creatinine, Ser: 1.4 mg/dL — ABNORMAL HIGH (ref 0.61–1.24)
GFR calc Af Amer: 58 mL/min — ABNORMAL LOW (ref >60)
GFR calc non Af Amer: 50 mL/min — ABNORMAL LOW (ref >60)
Glucose, Bld: 107 mg/dL — ABNORMAL HIGH (ref 70–99)
Potassium: 3.5 mmol/L (ref 3.5–5.1)
Sodium: 132 mmol/L — ABNORMAL LOW (ref 135–145)

## 2020-03-25 LAB — SURGICAL PCR SCREEN
MRSA, PCR: NEGATIVE
Staphylococcus aureus: NEGATIVE

## 2020-03-25 MED ORDER — BOOST / RESOURCE BREEZE PO LIQD CUSTOM
1.0000 | ORAL | Status: DC
  Administered 2020-03-25 – 2020-03-26 (×2): 1 via ORAL

## 2020-03-25 MED ORDER — ENSURE ENLIVE PO LIQD
237.0000 mL | Freq: Two times a day (BID) | ORAL | Status: DC
  Administered 2020-03-26 – 2020-03-28 (×2): 237 mL via ORAL

## 2020-03-25 NOTE — Consult Note (Signed)
[address]       [place] [address]             [phone]        Connor [name] [place] Medical Record [id number] Date of Birth: 1945/12/08  Referring: Connor Schick, MD primary Care: Connor Huddle, MD Primary Cardiologist:No primary care provider on file.  Chief Complaint:    Chief Complaint  Patient presents with  . Dizziness  . Pneumonia  Patient examined, images of coronary angiograms and echocardiogram personally reviewed and discussed with patient.  History of Present Illness:     Very nice patient recently admitted with fatigue, shortness of breath weakness and chest discomfort.  Symptoms have been going on for about a week.  Chest CT scan to rule out PE showed bilateral pleural effusions right greater than left, no PE.  An echocardiogram showed low EF 25%.  He underwent cardiac catheterization yesterday which demonstrated significant left main and three-vessel stenosis.  Right heart cath demonstrated CVP 7 PA pressure 42/22 cardiac output 6 L/min and the EDP 23 mmHg.   Current Activity/ Functional Status: Patient is retired with her activity tolerance over the past few weeks   Zubrod Score: At the time of surgery this patient's most appropriate activity status/level should be described as: []     0    Normal activity, no symptoms []     1    Restricted in physical strenuous activity but ambulatory, able to do out light work []     2    Ambulatory and capable of self care, unable to do work activities, up and about                 more than 50%  Of the time                            [x]     3    Only limited self care, in bed greater than 50% of waking hours []     4    Completely disabled, no self care, confined to bed or chair []     5    Moribund  Past Medical History:  Diagnosis Date  . Arthritis    OA AND PAIN RT KNEE  . Cancer ([place])    h/o neck - ABOUT 6 YRS AGO - TX'D WITH RADIATION AND CHEMO   . Chronic kidney disease   . GERD  without esophagitis 03/21/2020  . Head and neck cancer ~ 2009   S/P radiation & Connor [name] [facility]  . Headache(784.0)   . Hyperlipidemia   . Hypertension   . 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 Prostate cancer ([name]) 05/20/14   Gleason 4+3=7, volume 25 gm  . Radiation 2015   hx of, prostate cancer  . Seizures ([place])    hx of x yrs, "the bad kind;bite tongue; STATES LAST Connor [name] 2013; WAS SEEING [name] - HE RETIRED AND PT LAST SAW DR. Leta [name]  . Stroke ([place]) Connor [name] 2013   UNABLE TO SPEAK OR MOVE AND RT SIDE WEAKNESS AND LOSS OF SKIN SENSITIVITY TO HEAT AND COLD ON RT SIDE, DOUBLE VISION. BALANCE PROBLEMS---STATES STILL HAS DOUBLE VISION AND BALANCE PROBLEM AND RT SIDED LOSS OF SKIN SENSITIVITY    Past Surgical History:  Procedure  Laterality Date  . hydrocelectomy  11/2000   left  . IR FLUORO GUIDE CV LINE RIGHT  08/31/2017  . IR US GUIDE VASC ACCESS RIGHT  08/31/2017  . JOINT REPLACEMENT     LEFT TOTAL KNEE ARTHROPLASTY  . MOUTH SURGERY  2019  . NEPHRECTOMY  1990's   left  . PROSTATE BIOPSY  05/20/14   Gleason 4+3=7, vol 25 gm  . RIGHT/LEFT HEART CATH AND CORONARY ANGIOGRAPHY N/A 03/24/2020   Procedure: RIGHT/LEFT HEART CATH AND CORONARY ANGIOGRAPHY;  Surgeon: Burnell Blanks, MD;  Location: [facility];  Service: Cardiovascular;  Laterality: N/A;  . TEE WITHOUT CARDIOVERSION  10/04/2011   Procedure: TRANSESOPHAGEAL ECHOCARDIOGRAM (TEE);  Surgeon: Candee Furbish, MD;  Location: [facility]OS[facility];  Service: Cardiovascular;  Laterality: N/A;  . TOTAL KNEE ARTHROPLASTY  2011   left  . TOTAL KNEE ARTHROPLASTY Right 02/24/2014   Procedure: RIGHT TOTAL KNEE ARTHROPLASTY;  Surgeon: Gearlean Alf, MD;  Location: [facility];  Service: Orthopedics;  Laterality: Right;    Social History   Tobacco Use  Smoking Status Never Smoker  Smokeless Tobacco Never Used    Social History    Substance and Sexual Activity  Alcohol Use Yes  . Alcohol/week: 0.0 standard drinks   Comment: WAS 6 TO 8 BEERS DAILY      Allergies  Allergen Reactions  . Morphine And Related Nausea And Vomiting    Current Facility-Administered Medications  Medication Dose Route Frequency Provider Last Rate Last Admin  . 0.9 %  sodium chloride infusion  250 mL Intravenous PRN Lauree Chandler D, MD      . 0.9 %  sodium chloride infusion  250 mL Intravenous PRN Burnell Blanks, MD      . acetaminophen (TYLENOL) tablet 650 mg  650 mg Oral Q4H PRN Burnell Blanks, MD      . aspirin chewable tablet 81 mg  81 mg Oral Daily Burnell Blanks, MD   81 mg at 03/25/20 0840  . chlorhexidine (PERIDEX) 0.12 % solution 15 mL  15 mL Mouth Rinse BID Burnell Blanks, MD   15 mL at 03/25/20 0840  . feeding supplement (BOOST / RESOURCE BREEZE) liquid 1 Container  1 Container Oral Q24H Charlynne Cousins, MD      . Derrill Memo ON 03/26/2020] feeding supplement (ENSURE ENLIVE) (ENSURE ENLIVE) liquid 237 mL  237 mL Oral BID BM Charlynne Cousins, MD      . furosemide (LASIX) injection 40 mg  40 mg Intravenous Q12H Burnell Blanks, MD   40 mg at 03/25/20 1730  . heparin ADULT infusion 100 units/mL (25000 units/258mL sodium chloride 0.45%)  1,300 Units/hr Intravenous Continuous Domenic Polite, MD 13 mL/hr at 03/25/20 0030 1,300 Units/hr at 03/25/20 0030  . ipratropium-albuterol (DUONEB) 0.5-2.5 (3) MG/3ML nebulizer solution 3 mL  3 mL Nebulization Q4H PRN Burnell Blanks, MD      . lamoTRIgine (LAMICTAL) tablet 100 mg  100 mg Oral q AM Burnell Blanks, MD   100 mg at 03/25/20 0612  . lamoTRIgine (LAMICTAL) tablet 200 mg  200 mg Oral QPM Burnell Blanks, MD   200 mg at 03/25/20 1730  . levofloxacin (LEVAQUIN) tablet 500 mg  500 mg Oral Daily Burnell Blanks, MD   500 mg at 03/25/20 1003  . midodrine (PROAMATINE) tablet 5 mg  5 mg Oral BID WC Burnell Blanks, MD   5 mg at 03/25/20 1730  . ondansetron (ZOFRAN) injection 4  mg  4 mg Intravenous Q6H PRN Burnell Blanks, MD      . pantoprazole (PROTONIX) EC tablet 40 mg  40 mg Oral Daily Burnell Blanks, MD   40 mg at 03/25/20 0840  . polyethylene glycol (MIRALAX / GLYCOLAX) packet 17 g  17 g Oral Daily PRN Burnell Blanks, MD   17 g at 03/24/20 1831  . simvastatin (ZOCOR) tablet 20 mg  20 mg Oral QPM Burnell Blanks, MD   20 mg at 03/25/20 1730  . sodium chloride flush (NS) 0.9 % injection 3 mL  3 mL Intravenous Q12H Burnell Blanks, MD   3 mL at 03/24/20 1438  . sodium chloride flush (NS) 0.9 % injection 3 mL  3 mL Intravenous PRN Lauree Chandler D, MD      . sodium chloride flush (NS) 0.9 % injection 3 mL  3 mL Intravenous Q12H Burnell Blanks, MD   3 mL at 03/23/20 2235  . sodium chloride flush (NS) 0.9 % injection 3 mL  3 mL Intravenous Q12H Lauree Chandler D, MD      . sodium chloride flush (NS) 0.9 % injection 3 mL  3 mL Intravenous PRN Burnell Blanks, MD        Medications Prior to Admission  Medication Sig Dispense Refill Last Dose  . albuterol (VENTOLIN HFA) 108 (90 Base) MCG/ACT inhaler Inhale 2 puffs into the lungs in the morning, at noon, and at bedtime.   03/21/2020 at Unknown time  . aspirin 81 MG tablet Take 81 mg by mouth daily.   03/21/2020 at Unknown time  . Cyanocobalamin (VITAMIN B-12 PO) Take 1 tablet by mouth daily.   03/21/2020 at Unknown time  . ferrous sulfate 325 (65 FE) MG tablet Take 325 mg by mouth 2 (two) times daily with a meal.   03/21/2020 at Unknown time  . fluconazole (DIFLUCAN) 200 MG tablet Take 2 tablets (400 mg total) by mouth daily. 60 tablet 2 03/21/2020 at Unknown time  . fludrocortisone (FLORINEF) 0.1 MG tablet Take 100 mcg by mouth daily.   03/21/2020 at Unknown time  . lamoTRIgine (LAMICTAL) 100 MG tablet 100mg  in AM and 200mg  in PM (Patient taking differently: Take 100-200 mg by  mouth See admin instructions. Take 1 tablet every morning and take 2 tablets every evening) 270 tablet 4 03/21/2020 at Unknown time  . Leuprolide Acetate, 3 Month, (LUPRON DEPOT, 58-MONTH, IM) Inject 1 each into the muscle every 3 (three) months.   Past Month at Unknown time  . levofloxacin (LEVAQUIN) 500 MG tablet Take 1 tablet (500 mg total) by mouth daily. (Patient taking differently: Take 500 mg by mouth every evening. ) 30 tablet 0 03/20/2020 at Unknown time  . simvastatin (ZOCOR) 20 MG tablet Take 20 mg by mouth every evening.   03/20/2020 at Unknown time    Family History  Problem Relation Age of Onset  . Diabetes Mother   . Heart disease Mother   . Heart disease Father      Review of Systems:   ROS History of head neck cancer treated with radiation therapy with subsequent osteonecrosis of his mandible requiring multiple operations.  Since the dental surgery.    Cardiac [place] of Systems: Y or  [    ]= no  Chest Pain [  y  ]  Resting SOB [   ] Exertional SOB  [ y ]  Orthopnea [  ]   Pedal Edema [   ]  Palpitations [  ] Syncope  [  ]   Presyncope [   ]  General Review of Systems: [Y] = yes [  ]=no Constitional: recent weight change [  ]; anorexia [  ]; fatigue Blue.Reese  ]; nausea [  ]; night sweats [  ]; fever [  ]; or chills [  ]                                                               Dental: Last Dentist visit:   Eye : blurred vision [  ]; diplopia [   ]; vision changes [  ];  Amaurosis fugax[  ]; Resp: cough [  ];  wheezing[  ];  hemoptysis[  ]; shortness of breath[y  ]; paroxysmal nocturnal dyspnea[  ]; dyspnea on exertion[  ]; or orthopnea[  ];  GI:  gallstones[  ], vomiting[  ];  dysphagia[  ]; melena[  ];  hematochezia [  ]; heartburn[  ];   Hx of  Colonoscopy[  ]; GU: kidney stones [  ]; hematuria[  ];   dysuria [  ];  nocturia[  ];  history of     obstruction [  ]; urinary frequency [  ]             Skin: rash, swelling[  ];, hair loss[  ];  peripheral edema[  ];  or  itching[  ]; Musculosketetal: myalgias[  ];  joint swelling[  ];  joint erythema[  ];  joint pain[  ];  back pain[  ];  Heme/Lymph: bruising[  ];  bleeding[  ];  anemia[  ];  Neuro: TIA[  ];  headaches[  ];  stroke[  ];  vertigo[  ];  seizures[  ];   paresthesias[  ];  difficulty walking[  ];  Psych:depression[  ]; anxiety[  ];  Endocrine: diabetes[  ];  thyroid dysfunction[  ];                Physical Exam: BP 107/72 (BP Location: Left Arm)   Pulse 85   Temp (!) 97.4 F (36.3 C) (Oral)   Resp 16   Ht 5\' 10"  (1.778 m)   Wt 85.6 kg   SpO2 94%   BMI 27.08 kg/m        Physical Exam  General: 74 year old male with malformed left mandible no acute distress HEENT: Normocephalic pupils equal , dentition adequate Neck: Supple without JVD, adenopathy, or bruit Chest: Clear to auscultation, symmetrical breath sounds, no rhonchi, no tenderness             or deformity Cardiovascular: Regular rate and rhythm, no murmur, no gallop, peripheral pulses             palpable in all extremities Abdomen:  Soft, nontender, no palpable mass or organomegaly Extremities: Warm, well-perfused, no clubbing cyanosis edema or tenderness,              no venous stasis changes of the legs Rectal/GU: Deferred Neuro: Grossly non--focal and symmetrical throughout Skin: Clean and dry without rash or ulceration   Diagnostic Studies & Laboratory data:     Recent Radiology Findings:   CARDIAC CATHETERIZATION  Result Date: 03/24/2020  Ost RCA to Prox RCA lesion is 99% stenosed.  Prox RCA lesion is 80% stenosed.  Mid RCA to Dist RCA lesion is 20% stenosed.  Ost LAD to Prox LAD lesion is 80% stenosed.  Ost Cx to Prox Cx lesion is 50% stenosed.  1st Diag lesion is 70% stenosed.  Dist LAD lesion is 30% stenosed.  Ost LM to Ost LAD lesion is 90% stenosed.  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 Elevated filling  pressures consistent with continued volume overload. Recommendations: Will consult CT surgery for CABG. Will resume IV Lasix tonight.     I have independently reviewed the above radiologic studies and discussed with the patient   Recent Lab Findings: Lab Results  Component Value Date   WBC 6.8 03/25/2020   HGB 8.7 (L) 03/25/2020   HCT 27.7 (L) 03/25/2020   PLT 320 03/25/2020   GLUCOSE 107 (H) 03/25/2020   CHOL 149 12/05/2011   TRIG 123 12/05/2011   HDL 56 12/05/2011   LDLCALC 68 12/05/2011   ALT 11 03/23/2020   AST 17 03/23/2020   NA 132 (L) 03/25/2020   K 3.5 03/25/2020   CL 90 (L) 03/25/2020   CREATININE 1.40 (H) 03/25/2020   BUN 20 03/25/2020   CO2 29 03/25/2020   TSH 1.207 12/05/2011   INR 1.1 02/28/2020   HGBA1C 5.3 12/05/2011      Assessment / Plan:      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 Patient will need multivessel coronary bypass grafting with possible perioperative mechanical support. Scheduled for Friday, June 4.       @ME1 @ 03/25/2020 7:17 PM

## 2020-03-25 NOTE — Progress Notes (Signed)
[facility] PROGRESS NOTE    Progress Note  Connor [name]  [id number] DOB: 02/18/1946 DOA: 03/21/2020 PCP: Josetta Huddle, MD     Brief Narrative:   Connor [name] is an 74 y.o. 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  Elevated troponins: Basically flat denies any chest pain with activity. Left heart cath was done that showed severe triple-vessel disease cardiology recommended CT surgery consultation. Cardiothoracic surgery evaluated the patient and recommended surgical intervention on 03/27/2020  History of seizure disorder: Continue Lamictal.  Chronic osteomyelitis of the mandible: With recent surgery [facility] follow-up for bleeding complications. Continue chronic levofloxacin.  Orthostatic hypotension: Review of 6 months of antihypertensive medication and Florinef. Continue low-dose midodrine TED hose.  History of metastatic prostate cancer/history of squamous cell carcinoma of the left tonsil status post radiation and surgery: Follow-up with [facility] as an outpatient.    DVT prophylaxis: lovenox Family Communication:none Status is: Inpatient  Remains inpatient appropriate because:Hemodynamically unstable   Dispo: The patient is from: Home              Anticipated d/c is to: SNF              Anticipated d/c date is: > 3 days              Patient currently is not medically stable to  d/c.  Code Status:     Code Status Orders  (From admission, onward)         Start     Ordered   03/24/20 1144  Full code  Continuous     03/24/20 1143        Code Status History    Date Active Date Inactive Code Status Order ID Comments User Context   03/22/2020 1025 03/24/2020 1143 DNR [id number]  Domenic Polite, MD ED   03/21/2020 2204 03/22/2020 1024 Full Code [id number]  Vernelle Emerald, MD ED   02/24/2014 1222 02/26/2014 1358 Full Code [id number]  Aluisio, Dione Plover, MD Inpatient   Advance Care Planning Activity    Advance Directive Documentation     Most Recent Value  Type of Advance Directive  Healthcare Power of [place], Living will  Pre-existing out of facility DNR order (yellow form or pink MOST form)  --  "MOST" Form in Place?  --        IV Access:    Peripheral IV   Procedures and diagnostic studies:   CARDIAC CATHETERIZATION  Result Date: 03/24/2020  Ost RCA to Prox RCA lesion is 99% stenosed.  Prox RCA lesion is 80% stenosed.  Mid RCA to Dist RCA lesion is 20% stenosed.  Ost LAD to Prox LAD lesion is 80% stenosed.  Ost Cx to Prox Cx lesion is 50% stenosed.  1st Diag lesion is 70% stenosed.  Dist LAD lesion is 30% stenosed.  Ost LM to Ost LAD lesion is 90% stenosed.  Severe triple vessel CAD with severe distal left main stenosis, severe ostial LAD  stenosis and severe ostial RCA/mid RCA stenosis. Elevated filling pressures consistent with continued volume overload. Recommendations: Will consult CT surgery for CABG. Will resume IV Lasix tonight.     Medical Consultants:    None.  Anti-Infectives:   none  Subjective:    Connor [name] relates his symptoms are improved.  Objective:    Vitals:   03/25/20 0346 03/25/20 0353 03/25/20 0743 03/25/20 1152  BP: 107/66  108/68 102/65  Pulse: 81  80 73  Resp: 18  14 14   Temp: 97.6 F (36.4 C)  98.6 F (37 C) 97.9 F (36.6 C)  TempSrc: Oral  Oral Oral  SpO2: 93%  97% 97%  Weight:  85.6 kg     Height:       SpO2: 97 % O2 Flow Rate (L/min): 2 L/min   Intake/Output Summary (Last 24 hours) at 03/25/2020 1305 Last data filed at 03/25/2020 1252 Gross per 24 hour  Intake 660 ml  Output 1225 ml  Net -565 ml   Filed Weights   03/23/20 0422 03/24/20 0427 03/25/20 0353  Weight: 87 kg 87.6 kg 85.6 kg    Exam: General exam: In no acute distress. Respiratory system: Good air movement and clear to auscultation. Cardiovascular system: S1 & S2 heard, RRR. No JVD. Gastrointestinal system: Abdomen is nondistended, soft and nontender.  Extremities: No pedal edema. Skin: No rashes, lesions or ulcers Psychiatry: Judgement and insight appear normal. Mood & affect appropriate.  Data Reviewed:    Labs: Basic Metabolic Panel: Recent Labs  Lab 03/21/20 1636 03/21/20 1636 03/22/20 0403 03/22/20 0403 03/23/20 [id number] 03/23/20 [id number] 03/24/20 [id number] 03/24/20 0342 03/24/20 1633 03/24/20 1633 03/24/20 1634 03/25/20 0721  NA 130*   < > 133*   < > 135  --  132*  --  133*  --  133* 132*  K 4.8   < > 4.4   < > 3.5   < > 3.7   < > 3.6   < > 3.6 3.5  CL 92*  --  94*  --  92*  --  90*  --   --   --   --  90*  CO2 27  --  29  --  32  --  30  --   --   --   --  29  GLUCOSE 132*  --  114*  --  103*  --  109*  --   --   --   --  107*  BUN 24*  --  19  --  21  --  20  --   --   --   --  20  CREATININE 1.11  --  1.17  --  1.31*  --  1.29*  --   --   --   --  1.40*  CALCIUM 9.2  --  9.1  --  9.2  --  9.2  --   --   --   --  9.1  MG  --   --  1.9  --   --   --   --   --   --   --   --   --    < > = values in this interval not displayed.   GFR Estimated Creatinine Clearance: 49.2 mL/min (A) (by C-G formula based on SCr of 1.4 mg/dL (H)). Liver Function Tests: Recent Labs  Lab 03/21/20 1730 03/22/20 0403 03/23/20 0429  AST 21 19 17   ALT 13 12 11  ALKPHOS 84 78 75  BILITOT 0.4 0.6 0.9  PROT 6.8 6.1* 6.2*  ALBUMIN 3.6 3.2* 3.1*   No results for input(s): LIPASE, AMYLASE in the last 168  hours. No results for input(s): AMMONIA in the last 168 hours. Coagulation profile No results for input(s): INR, PROTIME in the last 168 hours. COVID-19 Labs  No results for input(s): DDIMER, FERRITIN, LDH, CRP in the last 72 hours.  Lab Results  Component Value Date   [place] NEGATIVE 03/21/2020   [place] NEGATIVE 02/28/2020    CBC: Recent Labs  Lab 03/21/20 1636 03/21/20 1636 03/22/20 0403 03/22/20 0403 03/23/20 0429 03/24/20 0342 03/24/20 1633 03/24/20 1634 03/25/20 0721  WBC 6.1  --  6.1  --  7.0 6.4  --   --  6.8  NEUTROABS  --   --  3.9  --   --   --   --   --   --   HGB 8.5*   < > 8.4*   < > 8.6* 8.7* 9.5* 9.5* 8.7*  HCT 27.5*   < > 26.6*   < > 27.4* 28.2* 28.0* 28.0* 27.7*  MCV 91.7  --  91.4  --  91.0 89.8  --   --  89.4  PLT 318  --  275  --  287 303  --   --  320   < > = values in this interval not displayed.   Cardiac Enzymes: No results for input(s): CKTOTAL, CKMB, CKMBINDEX, TROPONINI in the last 168 hours. BNP (last 3 results) No results for input(s): PROBNP in the last 8760 hours. CBG: No results for input(s): GLUCAP in the last 168 hours. D-Dimer: No results for input(s): DDIMER in the last 72 hours. Hgb A1c: No results for input(s): HGBA1C in the last 72 hours. Lipid Profile: No results for input(s): CHOL, HDL, LDLCALC, TRIG, CHOLHDL, LDLDIRECT in the last 72 hours. Thyroid function studies: No results for input(s): TSH, T4TOTAL, T3FREE, THYROIDAB in the last 72 hours.  Invalid input(s): FREET3 Anemia work up: No results for input(s): VITAMINB12, FOLATE, FERRITIN, TIBC, IRON, RETICCTPCT in the last 72 hours. Sepsis Labs: Recent Labs  Lab 03/22/20 0403 03/23/20 0429 03/24/20 0342 03/25/20 0721  WBC 6.1 7.0 6.4 6.8   Microbiology Recent Results (from the past 240 hour(s))  SARS Coronavirus 2 by RT PCR (hospital order, performed in [facility] hospital lab) Nasopharyngeal Nasopharyngeal Swab     Status: None   Collection Time:  03/21/20  7:08 PM   Specimen: Nasopharyngeal Swab  Result Value Ref Range Status   SARS Coronavirus 2 NEGATIVE NEGATIVE Final    Comment: (NOTE) SARS-CoV-2 target nucleic acids are NOT DETECTED. The SARS-CoV-2 RNA is generally detectable in upper and lower respiratory specimens during the acute phase of infection. The lowest concentration of SARS-CoV-2 viral copies this assay can detect is 250 copies / mL. A negative result does not preclude SARS-CoV-2 infection and should not be used as the sole basis for treatment or other patient management decisions.  A negative result may occur with improper specimen collection / handling, submission of specimen other than nasopharyngeal swab, presence of viral mutation(s) within the areas targeted by this assay, and inadequate number of viral copies (<250 copies / mL). A negative result must be combined with clinical observations, patient history, and epidemiological information. Fact Sheet for Patients:   StrictlyIdeas.no Fact Sheet for Healthcare Providers: BankingDealers.co.za This test is not yet approved or cleared  by the [place] FDA and has been  authorized for detection and/or diagnosis of SARS-CoV-2 by FDA under an Emergency Use Authorization (EUA).  This EUA will remain in effect (meaning this test can be used) for the duration of the COVID-19 declaration under Section 564(b)(1) of the Act, 21 U.S.C. section 360bbb-3(b)(1), unless the authorization is terminated or revoked sooner. Performed at [facility], [place] [address], [place], [place] [address]   Culture, blood (routine x 2)     Status: None (Preliminary result)   Collection Time: 03/21/20 10:15 PM   Specimen: BLOOD RIGHT HAND  Result Value Ref Range Status   Specimen Description BLOOD RIGHT HAND  Final   Special Requests   Final    BOTTLES DRAWN AEROBIC AND ANAEROBIC Blood Culture results may not be optimal due to an  inadequate volume of blood received in culture bottles   Culture   Final    NO GROWTH 4 DAYS Performed at [facility], [place] [address], [place], [place] [address]    Report Status PENDING  Incomplete  Culture, blood (routine x 2)     Status: None (Preliminary result)   Collection Time: 03/21/20 10:21 PM   Specimen: BLOOD  Result Value Ref Range Status   Specimen Description BLOOD RIGHT ARM  Final   Special Requests   Final    BOTTLES DRAWN AEROBIC AND ANAEROBIC Blood Culture results may not be optimal due to an inadequate volume of blood received in culture bottles   Culture   Final    NO GROWTH 4 DAYS Performed at [facility], [place] [address], [place],  [address]    Report Status PENDING  Incomplete  Surgical pcr screen     Status: None   Collection Time: 03/25/20  8:54 AM   Specimen: Nasal Mucosa; Nasal Swab  Result Value Ref Range Status   MRSA, PCR NEGATIVE NEGATIVE Final   Staphylococcus aureus NEGATIVE NEGATIVE Final    Comment: (NOTE) The Xpert SA Assay (FDA approved for NASAL specimens in patients 42 years of age and older), is one component of a comprehensive surveillance program. It is not intended to diagnose infection nor to guide or monitor treatment. Performed at [facility], [place] [address], [place],  [address]      Medications:   . aspirin  81 mg Oral Daily  . chlorhexidine  15 mL Mouth Rinse BID  . feeding supplement (ENSURE ENLIVE)  237 mL Oral Q24H  . furosemide  40 mg Intravenous Q12H  . lactose free nutrition  237 mL Oral BID BM  . lamoTRIgine  100 mg Oral q AM  . lamoTRIgine  200 mg Oral QPM  . levofloxacin  500 mg Oral Daily  . midodrine  5 mg Oral BID WC  . pantoprazole  40 mg Oral Daily  . simvastatin  20 mg Oral QPM  . sodium chloride flush  3 mL Intravenous Q12H  . sodium chloride flush  3 mL Intravenous Q12H  . sodium chloride flush  3 mL Intravenous Q12H   Continuous Infusions: . sodium chloride    .  sodium chloride    . heparin 1,300 Units/hr (03/25/20 0030)      LOS: 4 days   Charlynne Cousins  [facility]  03/25/2020, 1:05 PM

## 2020-03-25 NOTE — Progress Notes (Signed)
DAILY PROGRESS NOTE   Patient Name: Connor [name] Date of Encounter: 03/25/2020 Cardiologist: No primary care provider on file.  Chief Complaint   No complaints  Patient Profile   Connor [name] is a 74 y.o. 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 Broadus John  Subjective   Cath personally reviewed and d/w Dr. Angelena Form yesterday - severe multivessel calcified coronary disease with distal LM disease - will need CABG. Creatinine up slightly to 1.4 today - only 40 cc contrast used. CO/CI remarkably preserved at 8.27/4, mean RA 7, PCWP 22, demonstrating continued volume overload, ordered to continue lasix 40 mg IV BID.  Objective   Vitals:   03/25/20 0028 03/25/20 0346 03/25/20 0353 03/25/20 0743  BP:  107/66  108/68  Pulse: 84 81  80  Resp:  18  14  Temp:  97.6 F (36.4 C)  98.6 F (37 C)  TempSrc:  Oral  Oral  SpO2: 94% 93%  97%  Weight:   85.6 kg   Height:        Intake/Output Summary (Last 24 hours) at 03/25/2020 0854 Last data filed at 03/25/2020 0810 Gross per 24 hour  Intake 360 ml  Output 525 ml  Net -165 ml   Filed Weights   03/23/20 0422 03/24/20 0427 03/25/20 0353  Weight: 87 kg 87.6 kg 85.6 kg    Physical Exam   General appearance: alert and no distress Neck: no carotid bruit, no JVD and thyroid not enlarged, symmetric, no tenderness/mass/nodules Lungs: clear to auscultation bilaterally Heart: regular rate and rhythm Abdomen: soft, non-tender; bowel sounds normal; no masses,  no organomegaly Extremities: extremities normal, atraumatic, no cyanosis or edema Pulses: 2+ and  symmetric Skin: Skin color, texture, turgor normal. No rashes or lesions Neurologic: Grossly normal Psych: Pleasant  Inpatient Medications    Scheduled Meds: . aspirin  81 mg Oral Daily  . chlorhexidine  15 mL Mouth Rinse BID  . feeding supplement (ENSURE ENLIVE)  237 mL Oral Q24H  . furosemide  40 mg Intravenous Q12H  . lactose free nutrition  237 mL Oral BID BM  . lamoTRIgine  100 mg Oral q AM  . lamoTRIgine  200 mg Oral QPM  . levofloxacin  500 mg Oral Daily  . midodrine  5 mg Oral BID WC  . pantoprazole  40 mg Oral Daily  . simvastatin  20 mg Oral QPM  . sodium chloride flush  3 mL Intravenous Q12H  . sodium chloride flush  3 mL Intravenous Q12H  . sodium chloride flush  3 mL Intravenous Q12H    Continuous Infusions: . sodium chloride    . sodium chloride    . heparin 1,300 Units/hr (03/25/20 0030)    PRN Meds: sodium chloride, sodium chloride, acetaminophen, ipratropium-albuterol, ondansetron (ZOFRAN) IV, polyethylene glycol, sodium chloride flush, sodium chloride flush   Labs   Results for orders placed or performed during the hospital encounter of 03/21/20 (from the past 48 hour(s))  CBC     Status: Abnormal   Collection Time: 03/24/20  3:42 AM  Result Value Ref Range   WBC 6.4 4.0 - 10.5 K/uL  RBC 3.14 (L) 4.22 - 5.81 MIL/uL   Hemoglobin 8.7 (L) 13.0 - 17.0 g/dL   HCT 28.2 (L) 39.0 - 52.0 %   MCV 89.8 80.0 - 100.0 fL   MCH 27.7 26.0 - 34.0 pg   MCHC 30.9 30.0 - 36.0 g/dL   RDW 14.1 11.5 - 15.5 %   Platelets 303 150 - 400 K/uL   nRBC 0.0 0.0 - 0.2 %    Comment: Performed at [facility] [address], [place], [place] [id number]  Basic metabolic panel     Status: Abnormal   Collection Time: 03/24/20  3:42 AM  Result Value Ref Range   Sodium 132 (L) 135 - 145 mmol/L   Potassium 3.7 3.5 - 5.1 mmol/L   Chloride 90 (L) 98 - 111 mmol/L   CO2 30 22 - 32 mmol/L   Glucose, Bld 109 (H) 70 - 99 mg/dL    Comment: Glucose reference range applies only to  samples taken after fasting for at least 8 hours.   BUN 20 8 - 23 mg/dL   Creatinine, Ser 1.29 (H) 0.61 - 1.24 mg/dL   Calcium 9.2 8.9 - 10.3 mg/dL   GFR calc non Af Amer 55 (L) >60 mL/min   GFR calc Af Amer >60 >60 mL/min   Anion gap 12 5 - 15    Comment: Performed at [facility] [address], [place], [place] [address]  POCT I-Stat EG7     Status: Abnormal   Collection Time: 03/24/20  4:33 PM  Result Value Ref Range   pH, Ven 7.402 7.250 - 7.430   pCO2, Ven 51.9 44.0 - 60.0 mmHg   pO2, Ven 33.0 32.0 - 45.0 mmHg   Bicarbonate 32.3 (H) 20.0 - 28.0 mmol/L   TCO2 34 (H) 22 - 32 mmol/L   O2 Saturation 63.0 %   Acid-Base Excess 7.0 (H) 0.0 - 2.0 mmol/L   Sodium 133 (L) 135 - 145 mmol/L   Potassium 3.6 3.5 - 5.1 mmol/L   Calcium, Ion 1.21 1.15 - 1.40 mmol/L   HCT 28.0 (L) 39.0 - 52.0 %   Hemoglobin 9.5 (L) 13.0 - 17.0 g/dL   Sample type VENOUS   I-STAT 7, (LYTES, BLD GAS, ICA, H+H)     Status: Abnormal   Collection Time: 03/24/20  4:34 PM  Result Value Ref Range   pH, Arterial 7.401 7.350 - 7.450   pCO2 arterial 50.1 (H) 32.0 - 48.0 mmHg   pO2, Arterial 61 (L) 83.0 - 108.0 mmHg   Bicarbonate 31.1 (H) 20.0 - 28.0 mmol/L   TCO2 33 (H) 22 - 32 mmol/L   O2 Saturation 91.0 %   Acid-Base Excess 5.0 (H) 0.0 - 2.0 mmol/L   Sodium 133 (L) 135 - 145 mmol/L   Potassium 3.6 3.5 - 5.1 mmol/L   Calcium, Ion 1.22 1.15 - 1.40 mmol/L   HCT 28.0 (L) 39.0 - 52.0 %   Hemoglobin 9.5 (L) 13.0 - 17.0 g/dL   Sample type ARTERIAL   Basic metabolic panel     Status: Abnormal   Collection Time: 03/25/20  7:21 AM  Result Value Ref Range   Sodium 132 (L) 135 - 145 mmol/L   Potassium 3.5 3.5 - 5.1 mmol/L   Chloride 90 (L) 98 - 111 mmol/L   CO2 29 22 - 32 mmol/L   Glucose, Bld 107 (H) 70 - 99 mg/dL    Comment: Glucose reference range applies only to samples taken after  fasting for at least 8 hours.   BUN 20 8 - 23 mg/dL   Creatinine, Ser 1.40 (H) 0.61 - 1.24 mg/dL   Calcium 9.1 8.9 - 10.3  mg/dL   GFR calc non Af Amer 50 (L) >60 mL/min   GFR calc Af Amer 58 (L) >60 mL/min   Anion gap 13 5 - 15    Comment: Performed at [facility] [address], [place], [place] [address]  CBC     Status: Abnormal   Collection Time: 03/25/20  7:21 AM  Result Value Ref Range   WBC 6.8 4.0 - 10.5 K/uL   RBC 3.10 (L) 4.22 - 5.81 MIL/uL   Hemoglobin 8.7 (L) 13.0 - 17.0 g/dL   HCT 27.7 (L) 39.0 - 52.0 %   MCV 89.4 80.0 - 100.0 fL   MCH 28.1 26.0 - 34.0 pg   MCHC 31.4 30.0 - 36.0 g/dL   RDW 14.4 11.5 - 15.5 %   Platelets 320 150 - 400 K/uL   nRBC 0.0 0.0 - 0.2 %    Comment: Performed at [facility], [place] [address] [place], [place] [address]  Heparin level (unfractionated)     Status: None   Collection Time: 03/25/20  7:21 AM  Result Value Ref Range   Heparin Unfractionated 0.45 0.30 - 0.70 IU/mL    Comment: (NOTE) If heparin results are below expected values, and patient dosage has  been confirmed, suggest follow up testing of antithrombin III levels. Performed at [facility], [place] [address] [place], [place] [address]     ECG   Sinus rhythm with anterior infarct (5/29) - Personally Reviewed  Telemetry   Sinus rhythm - Personally Reviewed  Radiology    CARDIAC CATHETERIZATION  Result Date: 03/24/2020  Ost RCA to Prox RCA lesion is 99% stenosed.  Prox RCA lesion is 80% stenosed.  Mid RCA to Dist RCA lesion is 20% stenosed.  Ost LAD to Prox LAD lesion is 80% stenosed.  Ost Cx to Prox Cx lesion is 50% stenosed.  1st Diag lesion is 70% stenosed.  Dist LAD lesion is 30% stenosed.  Ost LM to Ost LAD lesion is 90% stenosed.  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 Elevated filling pressures consistent with continued volume overload. Recommendations: Will consult CT surgery for CABG. Will resume IV Lasix tonight.    Cardiac Studies   See above  Assessment    Principal Problem:   Acute CHF (congestive heart failure) (HCC) Active Problems:   Essential hypertension   Seizure disorder (HCC)   Mixed hyperlipidemia   Chronic osteomyelitis of mandible   Orthostatic hypotension   GERD without esophagitis   Elevated troponin level not due myocardial infarction   Acute congestive heart failure (HCC)   Acute systolic CHF (congestive heart failure), NYHA class 3 ([place])   Ischemic cardiomyopathy   Coronary artery disease involving native coronary artery of native heart with unstable angina pectoris (HCC)   Malnutrition of moderate degree   Plan   Discussed cath results with the patient and family today - has not been seen by CT surgery yet, however, this appears to be his only option. Severe distal LM and ostial RCA disease.  Heavily calcified - not targets for PCI. I don't perceive his malignancy to be associated with a prognosis of <1 year, therefore, I think it would be reasonable to consider CABG evaluation.  Appreciated CT surgery recommendations. Would be high risk for sudden death if discharged - continue heparin.  Time Spent Directly with Patient:  I have spent a total of 25 minutes with the patient reviewing hospital notes, telemetry, EKGs, labs and examining the patient as well as establishing an assessment and plan that was discussed personally with the patient.  > 50% of time was spent in direct patient care.  Length of Stay:  LOS: 4 days   Pixie Casino, MD, [facility], [place] Director of the Advanced Lipid Disorders &  Cardiovascular Risk Reduction Clinic Diplomate of the American Board of Clinical Lipidology Attending Cardiologist  Direct Dial: [phone]  Fax: [phone]  Website:  [email or web address] 03/25/2020, 8:54 AM

## 2020-03-25 NOTE — Progress Notes (Deleted)
Nutrition Follow-up  DOCUMENTATION CODES:   Non-severe (moderate) malnutrition in context of chronic illness  INTERVENTION:   Patient day 10 without bowel movement- regimen in place  Tube feeding:  -Vital 1.5 @ 20 ml/hr via Cortrak -Increase by 10 ml Q4 hours to goal rate of 60 ml/hr (1440 ml) -30 ml Prostat BID  Provides: 2360 kcals, 127 grams protein, 1100 ml free water.  Monitor for refeeding.   NUTRITION DIAGNOSIS:   Moderate Malnutrition related to chronic illness(L mandible osteomyelitis/necrosis) as evidenced by mild fat depletion, mild muscle depletion.  Ongoing  GOAL:   Patient will meet greater than or equal to 90% of their needs  Progressing   MONITOR:   Diet advancement, Labs, Weight trends, Skin, I & O's  REASON FOR ASSESSMENT:   Consult Assessment of nutrition requirement/status  ASSESSMENT:   Patient with PMH significant for prostate cancer with metastases to the bone, squamous cell carcinoma of L tonsil in 2009 (s/p radiation, chemo, resection), multiple previous CVAs, HTN, HLD, and osteonecrosis/osteomyelitis of L mandible s/p reconstruction. Presents this admission with acute exacerbation CHF.  6/1- s/p RHC/LHC and coronary angiography 6/4- s/p CABG x3  Pt discussed during ICU rounds and with RN.   Pt remains NPO with swallowing issues. Cortrak placed at beside. Xray confirmed mid gastric placement. Pt concerned he will be unable to work on swallowing with feeding tube. Assured pt he can have feeding tube and work with SLP. Plan supplements once diet advanced. Titrate TF to goal as pt is likely refeeding risk given poor PO. Will need to continue TF to meet 100% of needs until PO intake remains consistent (>/= 75%).   Admission weight: 93 kg  Current weight: 87.4 kg   I/O: -12,430 ml since admit  UOP: 2,128 ml x 24 hrs  Chest tubes: 145 mlx 24 hrs   Drips: milrinone  Medications: dulcolax, colace, 20 mg lasix BID, SS novolog, 10 mg reglan  QID Labs: K 3.2 (L) CBG 101-118  Diet Order:   Diet Order            Diet NPO time specified  Diet effective now              EDUCATION NEEDS:   Education needs have been addressed  Skin:  Skin Assessment: Reviewed RN Assessment(incision chest and R leg and knee)  Last BM:  5/28  Height:   Ht Readings from Last 1 Encounters:  03/22/20 5\' 10"  (1.778 m)    Weight:   Wt Readings from Last 1 Encounters:  03/30/20 87.4 kg   BMI:  Body mass index is 27.65 kg/m.  Estimated Nutritional Needs:   Kcal:  2300-2500 kcal  Protein:  115-130 grams  Fluid:  >/= 2.3 L/day   Mariana Single RD, LDN Clinical Nutrition Pager listed in [place]

## 2020-03-25 NOTE — Progress Notes (Signed)
Airway consult requested for the patient's upcoming CABG surgery.    Pt is a 74 yo male with h/o prostate CA and mets to bone, squamous cell CA of left tonsil in 2009 s/p radiation, chemo, and surgical resection.  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  He describes having an awake intubation performed prior to his mandible reconstruction at [facility].  At that time he states that he was much more swollen on his left jaw and had a limited mouth opening.  Since this surgery, he says the swelling is reduced and his mouth opening is improved.  Upon exam, his mouth opening is nearly 3 finger widths wide.  He has a recessed mandible and a mallampati score of 2.  His neck extension is less than normal.  The soft tissues overlying his left neck are tight and hard as a result of his prior radiation.  Midline anatomy is soft, mobile, and structures are easily identified.  Pt will be further evaluated on the day of surgery by the anesthesiologist.  Possible course may include glide scope, fiberoptic scope, and possibly performed while awake.    Albertha Ghee MD

## 2020-03-25 NOTE — Progress Notes (Signed)
[place] for heparin Indication: CAD  Allergies  Allergen Reactions  . Morphine And Related Nausea And Vomiting    Patient Measurements: Height: 5\' 10"  (177.8 cm) Weight: 85.6 kg (188 lb 11.2 oz) IBW/kg (Calculated) : 73 Heparin Dosing Weight: 85.6 kg  Vital Signs: Temp: 98.6 F (37 C) (06/02 0743) Temp Source: Oral (06/02 0743) BP: 108/68 (06/02 0743) Pulse Rate: 80 (06/02 0743)  Labs: Recent Labs    03/23/20 0429 03/23/20 0429 03/24/20 0342 03/24/20 0342 03/24/20 1633 03/24/20 1633 03/24/20 1634 03/25/20 0721  HGB 8.6*   < > 8.7*   < > 9.5*   < > 9.5* 8.7*  HCT 27.4*   < > 28.2*   < > 28.0*  --  28.0* 27.7*  PLT 287  --  303  --   --   --   --  320  HEPARINUNFRC  --   --   --   --   --   --   --  0.45  CREATININE 1.31*  --  1.29*  --   --   --   --  1.40*   < > = values in this interval not displayed.    Estimated Creatinine Clearance: 49.2 mL/min (A) (by C-G formula based on SCr of 1.4 mg/dL (H)).   Medical History: Past Medical History:  Diagnosis Date  . Arthritis    OA AND PAIN RT KNEE  . Cancer ([place])    h/o neck - ABOUT 6 YRS AGO - TX'D WITH RADIATION AND CHEMO   . Chronic kidney disease   . GERD without esophagitis 03/21/2020  . Head and neck cancer ~ 2009   S/P radiation & [name]  . Headache(784.0)   . Hyperlipidemia   . Hypertension   . Kidney carcinoma ([place])    h/o - NEPHRECTOMY   . 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 Prostate cancer ([place]) 05/20/14   Gleason 4+3=7, volume 25 gm  . Radiation 2015   hx of, prostate cancer  . Seizures ([place])    hx of x yrs, "the bad kind;bite tongue; STATES LAST [place] 2013; WAS SEEING [name] - HE RETIRED AND PT LAST SAW DR. [name]  . Stroke ([place]) [place] 2013   UNABLE TO SPEAK OR MOVE AND RT SIDE WEAKNESS AND LOSS OF SKIN SENSITIVITY TO HEAT AND COLD ON RT SIDE, DOUBLE VISION. BALANCE PROBLEMS---STATES  STILL HAS DOUBLE VISION AND BALANCE PROBLEM AND RT SIDED LOSS OF SKIN SENSITIVITY    Medications:  Medications Prior to Admission  Medication Sig Dispense Refill Last Dose  . albuterol (VENTOLIN HFA) 108 (90 Base) MCG/ACT inhaler Inhale 2 puffs into the lungs in the morning, at noon, and at bedtime.   03/21/2020 at Unknown time  . aspirin 81 MG tablet Take 81 mg by mouth daily.   03/21/2020 at Unknown time  . Cyanocobalamin (VITAMIN B-12 PO) Take 1 tablet by mouth daily.   03/21/2020 at Unknown time  . ferrous sulfate 325 (65 FE) MG tablet Take 325 mg by mouth 2 (two) times daily with a meal.   03/21/2020 at Unknown time  . fluconazole (DIFLUCAN) 200 MG tablet Take 2 tablets (400 mg total) by mouth daily. 60 tablet 2 03/21/2020 at Unknown time  . fludrocortisone (FLORINEF) 0.1 MG tablet Take 100 mcg by mouth daily.   03/21/2020 at Unknown time  . lamoTRIgine (LAMICTAL) 100  MG tablet 100mg  in AM and 200mg  in PM (Patient taking differently: Take 100-200 mg by mouth See admin instructions. Take 1 tablet every morning and take 2 tablets every evening) 270 tablet 4 03/21/2020 at Unknown time  . Leuprolide Acetate, 3 Month, (LUPRON DEPOT, 15-MONTH, IM) Inject 1 each into the muscle every 3 (three) months.   Past Month at Unknown time  . levofloxacin (LEVAQUIN) 500 MG tablet Take 1 tablet (500 mg total) by mouth daily. (Patient taking differently: Take 500 mg by mouth every evening. ) 30 tablet 0 03/20/2020 at Unknown time  . simvastatin (ZOCOR) 20 MG tablet Take 20 mg by mouth every evening.   03/20/2020 at Unknown time   Scheduled:  . aspirin  81 mg Oral Daily  . chlorhexidine  15 mL Mouth Rinse BID  . feeding supplement (ENSURE ENLIVE)  237 mL Oral Q24H  . furosemide  40 mg Intravenous Q12H  . lactose free nutrition  237 mL Oral BID BM  . lamoTRIgine  100 mg Oral q AM  . lamoTRIgine  200 mg Oral QPM  . levofloxacin  500 mg Oral Daily  . midodrine  5 mg Oral BID WC  . pantoprazole  40 mg Oral Daily  .  simvastatin  20 mg Oral QPM  . sodium chloride flush  3 mL Intravenous Q12H  . sodium chloride flush  3 mL Intravenous Q12H  . sodium chloride flush  3 mL Intravenous Q12H   Infusions:  . sodium chloride    . sodium chloride    . heparin 1,300 Units/hr (03/25/20 0030)    Assessment: 74 y.o male s/p cath done yesterday 6/1. He has 3VCAD. Plan for CVTS consult for CABG. Heparin was started last night ~8 hrs post sheath removal.  The initial 8 hour heparin level is therapeutic on 1300 units/hr.  Hgb 8.7 low stable, plt wnl.  No bleeding noted.   Goal of Therapy:  Heparin level 0.3-0.7 units/ml Monitor platelets by anticoagulation protocol: Yes   Plan:  Continue Heparin 1300 units/h Check 6-8 hr HL to confirm remains therapeutic.  Daily HL and CBC   Nicole Cella,  Clinical Pharmacist [phone] Please check AMION for all [place] phone numbers After 10:00 PM, call [place] [phone] 03/25/2020 8:59 AM

## 2020-03-25 NOTE — Progress Notes (Signed)
Oriented with Aimee RN today. The chart is accurate to the best of my knowledge.

## 2020-03-25 NOTE — Progress Notes (Signed)
ANTICOAGULATION CONSULT NOTE   Pharmacy Consult for heparin Indication: CAD  Allergies  Allergen Reactions   Morphine And Related Nausea And Vomiting    Patient Measurements: Height: 5\' 10"  (177.8 cm) Weight: 85.6 kg (188 lb 11.2 oz) IBW/kg (Calculated) : 73 Heparin Dosing Weight: 85.6 kg  Vital Signs: Temp: 97.4 F (36.3 C) (06/02 1536) Temp Source: Oral (06/02 1536) BP: 107/72 (06/02 1536) Pulse Rate: 85 (06/02 1536)  Labs: Recent Labs    03/23/20 0429 03/23/20 0429 03/24/20 0342 03/24/20 0342 03/24/20 1633 03/24/20 1633 03/24/20 1634 03/25/20 0721 03/25/20 1309  HGB 8.6*   < > 8.7*   < > 9.5*   < > 9.5* 8.7*  --   HCT 27.4*   < > 28.2*   < > 28.0*  --  28.0* 27.7*  --   PLT 287  --  303  --   --   --   --  320  --   HEPARINUNFRC  --   --   --   --   --   --   --  0.45 0.58  CREATININE 1.31*  --  1.29*  --   --   --   --  1.40*  --    < > = values in this interval not displayed.    Estimated Creatinine Clearance: 49.2 mL/min (A) (by C-G formula based on SCr of 1.4 mg/dL (H)).   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 - HE RETIRED AND PT LAST SAW DR. PENUMALLI   Stroke ([place]) DEC 2013   UNABLE TO SPEAK OR MOVE AND RT SIDE WEAKNESS AND LOSS OF SKIN SENSITIVITY TO HEAT AND COLD ON RT  SIDE, DOUBLE VISION. BALANCE PROBLEMS---STATES STILL HAS DOUBLE VISION AND BALANCE PROBLEM AND RT SIDED LOSS OF SKIN SENSITIVITY    Medications:  Medications Prior to Admission  Medication Sig Dispense Refill Last Dose   albuterol (VENTOLIN HFA) 108 (90 Base) MCG/ACT inhaler Inhale 2 puffs into the lungs in the morning, at noon, and at bedtime.   03/21/2020 at Unknown time   aspirin 81 MG tablet Take 81 mg by mouth daily.   03/21/2020 at Unknown time   Cyanocobalamin (VITAMIN B-12 PO) Take 1 tablet by mouth daily.   03/21/2020 at Unknown time   ferrous sulfate 325 (65 FE) MG tablet Take 325 mg by mouth 2 (two) times daily with a meal.   03/21/2020 at Unknown time   fluconazole (DIFLUCAN) 200 MG tablet Take 2 tablets (400 mg total) by mouth daily. 60 tablet 2 03/21/2020 at Unknown time   fludrocortisone (FLORINEF) 0.1 MG tablet Take 100  mcg by mouth daily.   03/21/2020 at Unknown time   lamoTRIgine (LAMICTAL) 100 MG tablet 100mg  in AM and 200mg  in PM (Patient taking differently: Take 100-200 mg by mouth See admin instructions. Take 1 tablet every morning and take 2 tablets every evening) 270 tablet 4 03/21/2020 at Unknown time   Leuprolide Acetate, 3 Month, (LUPRON DEPOT, 22-MONTH, IM) Inject 1 each into the muscle every 3 (three) months.   Past Month at Unknown time   levofloxacin (LEVAQUIN) 500 MG tablet Take 1 tablet (500 mg total) by mouth daily. (Patient taking differently: Take 500 mg by mouth every evening. ) 30 tablet 0 03/20/2020 at Unknown time   simvastatin (ZOCOR) 20 MG tablet Take 20 mg by mouth every evening.   03/20/2020 at Unknown time   Scheduled:   aspirin  81 mg Oral Daily   chlorhexidine  15 mL Mouth Rinse BID   feeding supplement (ENSURE ENLIVE)  237 mL Oral Q24H   furosemide  40 mg Intravenous Q12H   lactose free nutrition  237 mL Oral BID BM   lamoTRIgine  100 mg Oral q AM   lamoTRIgine  200 mg Oral QPM   levofloxacin  500 mg Oral Daily   midodrine  5 mg Oral  BID WC   pantoprazole  40 mg Oral Daily   simvastatin  20 mg Oral QPM   sodium chloride flush  3 mL Intravenous Q12H   sodium chloride flush  3 mL Intravenous Q12H   sodium chloride flush  3 mL Intravenous Q12H   Infusions:   sodium chloride     sodium chloride     heparin 1,300 Units/hr (03/25/20 0030)    Assessment: 74 y.o male s/p cath done yesterday 6/1. He has 3VCAD. Plan for CVTS consult for CABG. Heparin was started last night ~8 hrs post sheath removal.  The initial 8 hour heparin level is therapeutic on 1300 units/hr.  Hgb 8.7 low stable, plt wnl.    Next heparin level is 0.58, remains therapeutic on 1300 units/hour No bleeding noted. TCTS consulted: plan for CABG on 03/27/20.   Goal of Therapy:  Heparin level 0.3-0.7 units/ml Monitor platelets by anticoagulation protocol: Yes   Plan:  Continue Heparin 1300 units/h Daily HL and CBC   Nicole Cella, RPh Clinical Pharmacist [phone] Please check AMION for all [place] phone numbers After 10:00 PM, call [place] [phone] 03/25/2020 3:40 PM

## 2020-03-25 NOTE — Plan of Care (Signed)
  Problem: Education: Goal: Ability to verbalize understanding of medication therapies will improve Outcome: Progressing   Problem: Education: Goal: Knowledge of General Education information will improve Description: Including pain rating scale, medication(s)/side effects and non-pharmacologic comfort measures Outcome: Progressing   Problem: Health Behavior/Discharge Planning: Goal: Ability to manage health-related needs will improve Outcome: Progressing   Problem: Clinical Measurements: Goal: Will remain free from infection Outcome: Progressing   Problem: Activity: Goal: Risk for activity intolerance will decrease Outcome: Progressing   Problem: Nutrition: Goal: Adequate nutrition will be maintained Outcome: Progressing   Problem: Coping: Goal: Level of anxiety will decrease Outcome: Progressing   Problem: Elimination: Goal: Will not experience complications related to bowel motility Outcome: Progressing Goal: Will not experience complications related to urinary retention Outcome: Progressing   Problem: Pain Managment: Goal: General experience of comfort will improve Outcome: Progressing   Problem: Safety: Goal: Ability to remain free from injury will improve Outcome: Progressing   Problem: Skin Integrity: Goal: Risk for impaired skin integrity will decrease Outcome: Progressing

## 2020-03-25 NOTE — Progress Notes (Signed)
Physical Therapy Treatment Patient Details Name: Connor [name] MRN: [id number] DOB: [birth year] Today's Date: 03/25/2020    History of Present Illness Patient is a 74 y/o male who presents with SOB. Admitted with new onset of CHF. Also with bilateral pleural effusions. 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    PT Comments    Patient received in bed, agrees to ambulate. He reports he is still feeling quite weak. Performed bed mobility with mod independence, transfers with min guard. Ambulated 200 feet with min guard and rw. He continues to feel weak compared to his baseline. Demonstrates good safety awareness with mobility and takes his time. He will benefit from continued skilled PT while here to improve functional independence and strength for safe return home.    Follow Up Recommendations  Home health PT;Supervision - Intermittent     Equipment Recommendations  None recommended by PT    Recommendations for Other Services       Precautions / Restrictions Precautions Precautions: Fall Precaution Comments: hx of orthostatic hypotension Restrictions Weight Bearing Restrictions: No    Mobility  Bed Mobility Overal bed mobility: Modified Independent       Supine to sit: Modified independent (Device/Increase time)     General bed mobility comments: Use of bed rail, no physical assist needed  Transfers Overall transfer level: Needs assistance Equipment used: Rolling walker (2 wheeled) Transfers: Sit to/from Stand Sit to Stand: Min guard         General transfer comment: Min guard for safety. Stood from [employer].  Ambulation/Gait Ambulation/Gait assistance: Min guard Gait Distance (Feet): 200 Feet Assistive device: Rolling walker (2 wheeled) Gait Pattern/deviations: Step-through pattern;Decreased stride length Gait velocity: decreased   General Gait Details: Slow, guarded and mostly  steady gait with RW for support; reports feeling weakness in BLEs. No dizziness. Sp02 >94% on RA.   Stairs             Wheelchair Mobility    Modified Rankin (Stroke Patients Only)       Balance Overall balance assessment: Needs assistance;History of Falls Sitting-balance support: Feet supported Sitting balance-Leahy Scale: Good     Standing balance support: Bilateral upper extremity supported;During functional activity Standing balance-Leahy Scale: Fair Standing balance comment: Requires UE support in standing.                            Cognition Arousal/Alertness: Awake/alert Behavior During Therapy: WFL for tasks assessed/performed Overall Cognitive Status: Within Functional Limits for tasks assessed                                        Exercises      General Comments        Pertinent Vitals/Pain Pain Assessment: No/denies pain    Home Living                      Prior Function            PT Goals (current goals can now be found in the care plan section) Acute Rehab PT Goals Patient Stated Goal: to get stronger and get home PT Goal Formulation: With patient Time For Goal Achievement: 04/07/20 Potential to Achieve Goals: Good Progress towards PT goals: Progressing toward goals    Frequency  Min 3X/week      PT Plan Current plan remains appropriate    Co-evaluation              AM-PAC PT "6 Clicks" Mobility   Outcome Measure  Help needed turning from your back to your side while in a flat bed without using bedrails?: None Help needed moving from lying on your back to sitting on the side of a flat bed without using bedrails?: None Help needed moving to and from a bed to a chair (including a wheelchair)?: A Little Help needed standing up from a chair using your arms (e.g., wheelchair or bedside chair)?: A Little Help needed to walk in hospital room?: A Little Help needed climbing 3-5 steps  with a railing? : A Little 6 Click Score: 20    End of Session Equipment Utilized During Treatment: Gait belt Activity Tolerance: Patient tolerated treatment well Patient left: in chair;with call bell/phone within reach Nurse Communication: Mobility status PT Visit Diagnosis: Muscle weakness (generalized) (M62.81);Difficulty in walking, not elsewhere classified (R26.2)     Time: [id number] PT Time Calculation (min) (ACUTE ONLY): 20 min  Charges:  $Gait Training: 8-22 mins                     Nimesh Riolo, PT, GCS 03/25/20,10:07 AM

## 2020-03-26 ENCOUNTER — Inpatient Hospital Stay (HOSPITAL_COMMUNITY): Payer: PPO

## 2020-03-26 DIAGNOSIS — I2510 Atherosclerotic heart disease of native coronary artery without angina pectoris: Secondary | ICD-10-CM

## 2020-03-26 DIAGNOSIS — I6522 Occlusion and stenosis of left carotid artery: Secondary | ICD-10-CM

## 2020-03-26 DIAGNOSIS — M272 Inflammatory conditions of jaws: Secondary | ICD-10-CM

## 2020-03-26 DIAGNOSIS — Z01810 Encounter for preprocedural cardiovascular examination: Secondary | ICD-10-CM

## 2020-03-26 LAB — CULTURE, BLOOD (ROUTINE X 2)
Culture: NO GROWTH
Culture: NO GROWTH

## 2020-03-26 LAB — PROTIME-INR
INR: 1.1 (ref 0.8–1.2)
Prothrombin Time: 14.2 seconds (ref 11.4–15.2)

## 2020-03-26 LAB — CBC
HCT: 28.6 % — ABNORMAL LOW (ref 39.0–52.0)
Hemoglobin: 8.9 g/dL — ABNORMAL LOW (ref 13.0–17.0)
MCH: 27.6 pg (ref 26.0–34.0)
MCHC: 31.1 g/dL (ref 30.0–36.0)
MCV: 88.5 fL (ref 80.0–100.0)
Platelets: 306 10*3/uL (ref 150–400)
RBC: 3.23 MIL/uL — ABNORMAL LOW (ref 4.22–5.81)
RDW: 14.3 % (ref 11.5–15.5)
WBC: 6.3 10*3/uL (ref 4.0–10.5)
nRBC: 0 % (ref 0.0–0.2)

## 2020-03-26 LAB — BASIC METABOLIC PANEL
Anion gap: 13 (ref 5–15)
BUN: 24 mg/dL — ABNORMAL HIGH (ref 8–23)
CO2: 30 mmol/L (ref 22–32)
Calcium: 9 mg/dL (ref 8.9–10.3)
Chloride: 91 mmol/L — ABNORMAL LOW (ref 98–111)
Creatinine, Ser: 1.54 mg/dL — ABNORMAL HIGH (ref 0.61–1.24)
GFR calc Af Amer: 51 mL/min — ABNORMAL LOW (ref >60)
GFR calc non Af Amer: 44 mL/min — ABNORMAL LOW (ref >60)
Glucose, Bld: 106 mg/dL — ABNORMAL HIGH (ref 70–99)
Potassium: 3.3 mmol/L — ABNORMAL LOW (ref 3.5–5.1)
Sodium: 134 mmol/L — ABNORMAL LOW (ref 135–145)

## 2020-03-26 LAB — ABO/RH: ABO/RH(D): O POS

## 2020-03-26 LAB — HEMOGLOBIN A1C
Hgb A1c MFr Bld: 4.9 % (ref 4.8–5.6)
Mean Plasma Glucose: 93.93 mg/dL

## 2020-03-26 LAB — HEPARIN LEVEL (UNFRACTIONATED)
Heparin Unfractionated: 0.79 IU/mL — ABNORMAL HIGH (ref 0.30–0.70)
Heparin Unfractionated: 0.77 IU/mL — ABNORMAL HIGH (ref 0.30–0.70)
Heparin Unfractionated: 0.41 IU/mL (ref 0.30–0.70)

## 2020-03-26 LAB — TSH: TSH: 1.157 u[IU]/mL (ref 0.350–4.500)

## 2020-03-26 LAB — PREPARE RBC (CROSSMATCH)

## 2020-03-26 MED ORDER — ALPRAZOLAM 0.25 MG PO TABS: 0.2500 mg | ORAL_TABLET | ORAL | Status: DC | PRN

## 2020-03-26 MED ORDER — MAGNESIUM SULFATE 50 % IJ SOLN
40.0000 meq | INTRAMUSCULAR | Status: DC
  Filled 2020-03-26: qty 9.85

## 2020-03-26 MED ORDER — BISACODYL 5 MG PO TBEC
5.0000 mg | DELAYED_RELEASE_TABLET | Freq: Once | ORAL | Status: AC
  Administered 2020-03-26: 5 mg via ORAL
  Filled 2020-03-26: qty 1

## 2020-03-26 MED ORDER — EPINEPHRINE HCL 5 MG/250ML IV SOLN IN NS
0.0000 ug/min | INTRAVENOUS | Status: AC
  Administered 2020-03-27: 2 ug/min via INTRAVENOUS
  Filled 2020-03-26: qty 250

## 2020-03-26 MED ORDER — VANCOMYCIN HCL 1500 MG/300ML IV SOLN
1500.0000 mg | INTRAVENOUS | Status: AC
  Administered 2020-03-27: 1500 mg via INTRAVENOUS
  Filled 2020-03-26: qty 300

## 2020-03-26 MED ORDER — NOREPINEPHRINE 4 MG/250ML-% IV SOLN
0.0000 ug/min | INTRAVENOUS | Status: AC
  Administered 2020-03-27: 2 ug/min via INTRAVENOUS
  Filled 2020-03-26: qty 250

## 2020-03-26 MED ORDER — SODIUM CHLORIDE 0.9 % IV SOLN
INTRAVENOUS | Status: DC
  Filled 2020-03-26: qty 30

## 2020-03-26 MED ORDER — TRANEXAMIC ACID 1000 MG/10ML IV SOLN
1.5000 mg/kg/h | INTRAVENOUS | Status: AC
  Administered 2020-03-27: 1.5 mg/kg/h via INTRAVENOUS
  Filled 2020-03-26: qty 25

## 2020-03-26 MED ORDER — POTASSIUM CHLORIDE CRYS ER 20 MEQ PO TBCR
40.0000 meq | EXTENDED_RELEASE_TABLET | Freq: Two times a day (BID) | ORAL | Status: DC
  Administered 2020-03-26: 40 meq via ORAL
  Filled 2020-03-26 (×2): qty 2

## 2020-03-26 MED ORDER — NITROGLYCERIN IN D5W 200-5 MCG/ML-% IV SOLN
2.0000 ug/min | INTRAVENOUS | Status: DC
  Filled 2020-03-26: qty 250

## 2020-03-26 MED ORDER — SODIUM CHLORIDE 0.9 % IV SOLN
1.5000 g | INTRAVENOUS | Status: AC
  Administered 2020-03-27: 1.5 g via INTRAVENOUS
  Filled 2020-03-26: qty 1.5

## 2020-03-26 MED ORDER — CHLORHEXIDINE GLUCONATE 4 % EX LIQD
60.0000 mL | Freq: Once | CUTANEOUS | Status: AC
  Administered 2020-03-27: 4 via TOPICAL
  Filled 2020-03-26: qty 60

## 2020-03-26 MED ORDER — DEXMEDETOMIDINE HCL IN NACL 400 MCG/100ML IV SOLN
0.1000 ug/kg/h | INTRAVENOUS | Status: AC
  Administered 2020-03-27: .7 ug/kg/h via INTRAVENOUS
  Filled 2020-03-26: qty 100

## 2020-03-26 MED ORDER — POTASSIUM CHLORIDE 2 MEQ/ML IV SOLN
80.0000 meq | INTRAVENOUS | Status: DC
  Filled 2020-03-26: qty 40

## 2020-03-26 MED ORDER — DIAZEPAM 2 MG PO TABS
2.0000 mg | ORAL_TABLET | Freq: Once | ORAL | Status: AC
  Administered 2020-03-27: 2 mg via ORAL
  Filled 2020-03-26: qty 1

## 2020-03-26 MED ORDER — SODIUM CHLORIDE 0.9 % IV SOLN
750.0000 mg | INTRAVENOUS | Status: AC
  Administered 2020-03-27: 750 mg via INTRAVENOUS
  Filled 2020-03-26: qty 750

## 2020-03-26 MED ORDER — TRANEXAMIC ACID (OHS) PUMP PRIME SOLUTION
2.0000 mg/kg | INTRAVENOUS | Status: DC
  Filled 2020-03-26: qty 1.7

## 2020-03-26 MED ORDER — CHLORHEXIDINE GLUCONATE 4 % EX LIQD
60.0000 mL | Freq: Once | CUTANEOUS | Status: AC
  Administered 2020-03-26: 4 via TOPICAL
  Filled 2020-03-26: qty 60

## 2020-03-26 MED ORDER — PHENYLEPHRINE HCL-NACL 20-0.9 MG/250ML-% IV SOLN
30.0000 ug/min | INTRAVENOUS | Status: AC
  Administered 2020-03-27: 20 ug/min via INTRAVENOUS
  Filled 2020-03-26: qty 250

## 2020-03-26 MED ORDER — MILRINONE LACTATE IN DEXTROSE 20-5 MG/100ML-% IV SOLN
0.3000 ug/kg/min | INTRAVENOUS | Status: AC
  Administered 2020-03-27: .25 ug/kg/min via INTRAVENOUS
  Filled 2020-03-26: qty 100

## 2020-03-26 MED ORDER — INSULIN REGULAR(HUMAN) IN NACL 100-0.9 UT/100ML-% IV SOLN
INTRAVENOUS | Status: AC
  Administered 2020-03-27: .9 [IU]/h via INTRAVENOUS
  Filled 2020-03-26: qty 100

## 2020-03-26 MED ORDER — TRANEXAMIC ACID (OHS) BOLUS VIA INFUSION
15.0000 mg/kg | INTRAVENOUS | Status: AC
  Administered 2020-03-27: 1276.5 mg via INTRAVENOUS
  Filled 2020-03-26: qty 1277

## 2020-03-26 MED ORDER — TEMAZEPAM 7.5 MG PO CAPS: 15.0000 mg | ORAL_CAPSULE | Freq: Once | ORAL | Status: DC | PRN

## 2020-03-26 MED ORDER — CHLORHEXIDINE GLUCONATE 0.12 % MT SOLN
15.0000 mL | Freq: Once | OROMUCOSAL | Status: AC
  Administered 2020-03-27: 15 mL via OROMUCOSAL
  Filled 2020-03-26: qty 15

## 2020-03-26 MED ORDER — PLASMA-LYTE 148 IV SOLN
INTRAVENOUS | Status: DC
  Filled 2020-03-26: qty 2.5

## 2020-03-26 MED ORDER — METOPROLOL TARTRATE 12.5 MG HALF TABLET
12.5000 mg | ORAL_TABLET | Freq: Once | ORAL | Status: AC
  Administered 2020-03-27: 12.5 mg via ORAL
  Filled 2020-03-26: qty 1

## 2020-03-26 MED FILL — Lidocaine HCl Local Inj 1%: INTRAMUSCULAR | Qty: 20 | Status: AC

## 2020-03-26 NOTE — Progress Notes (Signed)
DAILY PROGRESS NOTE   Patient Name: Connor [name] Date of Encounter: 03/26/2020 Cardiologist: No primary care provider on file.  Chief Complaint   No complaints  Patient Profile   Connor [name] is a 74 y.o. 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 Broadus John  Subjective   No complaints, resting comfortably. Plan for CABG tomorrow.  Objective   Vitals:   03/25/20 1536 03/25/20 1928 03/26/20 0440 03/26/20 0446  BP: 107/72 107/68 95/61   Pulse: 85 80 78   Resp: 16 15 17    Temp: (!) 97.4 F (36.3 C) 98 F (36.7 C) 98.1 F (36.7 C)   TempSrc: Oral Oral Oral   SpO2: 94% 98% 95%   Weight:    85.1 kg  Height:        Intake/Output Summary (Last 24 hours) at 03/26/2020 [id number] Last data filed at 03/26/2020 0600 Gross per 24 hour  Intake 960 ml  Output 2500 ml  Net -1540 ml   Filed Weights   03/24/20 0427 03/25/20 0353 03/26/20 0446  Weight: 87.6 kg 85.6 kg 85.1 kg    Physical Exam   General appearance: alert and no distress Neck: no carotid bruit, no JVD and thyroid not enlarged, symmetric, no tenderness/mass/nodules Lungs: clear to auscultation bilaterally Heart: regular rate and rhythm Abdomen: soft, non-tender; bowel sounds normal; no masses,  no organomegaly Extremities: extremities normal, atraumatic, no cyanosis or edema Pulses: 2+ and symmetric Skin: Skin color, texture, turgor normal. No rashes or lesions Neurologic: Grossly normal Psych: Pleasant  Inpatient Medications    Scheduled Meds: . aspirin  81 mg Oral Daily  . bisacodyl  5 mg Oral Once  . chlorhexidine  60 mL Topical Once     And  . [START ON 03/27/2020] chlorhexidine  60 mL Topical Once  . chlorhexidine  15 mL Mouth Rinse BID  . [START ON 03/27/2020] chlorhexidine  15 mL Mouth/Throat Once  . [START ON 03/27/2020] diazepam  2 mg Oral Once  . [START ON 03/27/2020] epinephrine  0-10 mcg/min Intravenous To OR  . feeding supplement  1 Container Oral Q24H  . feeding supplement (ENSURE ENLIVE)  237 mL Oral BID BM  . furosemide  40 mg Intravenous Q12H  . [START ON 03/27/2020] heparin-papaverine-plasmalyte irrigation   Irrigation To OR  . [START ON 03/27/2020] insulin   Intravenous To OR  . lamoTRIgine  100 mg Oral q AM  . lamoTRIgine  200 mg Oral QPM  . levofloxacin  500 mg Oral Daily  . [START ON 03/27/2020] magnesium sulfate  40 mEq Other To OR  . [START ON 03/27/2020] metoprolol tartrate  12.5 mg Oral Once  . midodrine  5 mg Oral BID WC  . pantoprazole  40 mg Oral Daily  . [START ON 03/27/2020] phenylephrine  30-200 mcg/min Intravenous To OR  . [START ON 03/27/2020] potassium chloride  80 mEq Other To OR  . simvastatin  20 mg Oral QPM  . sodium chloride flush  3 mL Intravenous Q12H  . sodium chloride flush  3 mL Intravenous Q12H  . sodium chloride flush  3 mL Intravenous Q12H  . Derrill Memo  ON 03/27/2020] tranexamic acid  15 mg/kg Intravenous To OR  . [START ON 03/27/2020] tranexamic acid  2 mg/kg Intracatheter To OR    Continuous Infusions: . sodium chloride    . sodium chloride    . [START ON 03/27/2020] cefUROXime (ZINACEF)  IV    . [START ON 03/27/2020] cefUROXime (ZINACEF)  IV    . [START ON 03/27/2020] dexmedetomidine    . [START ON 03/27/2020] heparin 30,000 units/NS 1000 mL solution for CELLSAVER    . heparin 1,200 Units/hr (03/26/20 0451)  . [START ON 03/27/2020] milrinone    . [START ON 03/27/2020] nitroGLYCERIN    . [START ON 03/27/2020] norepinephrine    . [START ON 03/27/2020] tranexamic acid (CYKLOKAPRON) infusion ([facility])    . [START ON 03/27/2020] vancomycin      PRN Meds: sodium chloride, sodium chloride, acetaminophen,  ALPRAZolam, ipratropium-albuterol, ondansetron (ZOFRAN) IV, polyethylene glycol, sodium chloride flush, sodium chloride flush, temazepam   Labs   Results for orders placed or performed during the hospital encounter of 03/21/20 (from the past 48 hour(s))  POCT I-Stat EG7     Status: Abnormal   Collection Time: 03/24/20  4:33 PM  Result Value Ref Range   pH, Ven 7.402 7.250 - 7.430   pCO2, Ven 51.9 44.0 - 60.0 mmHg   pO2, Ven 33.0 32.0 - 45.0 mmHg   Bicarbonate 32.3 (H) 20.0 - 28.0 mmol/L   TCO2 34 (H) 22 - 32 mmol/L   O2 Saturation 63.0 %   Acid-Base Excess 7.0 (H) 0.0 - 2.0 mmol/L   Sodium 133 (L) 135 - 145 mmol/L   Potassium 3.6 3.5 - 5.1 mmol/L   Calcium, Ion 1.21 1.15 - 1.40 mmol/L   HCT 28.0 (L) 39.0 - 52.0 %   Hemoglobin 9.5 (L) 13.0 - 17.0 g/dL   Sample type VENOUS   I-STAT 7, (LYTES, BLD GAS, ICA, H+H)     Status: Abnormal   Collection Time: 03/24/20  4:34 PM  Result Value Ref Range   pH, Arterial 7.401 7.350 - 7.450   pCO2 arterial 50.1 (H) 32.0 - 48.0 mmHg   pO2, Arterial 61 (L) 83.0 - 108.0 mmHg   Bicarbonate 31.1 (H) 20.0 - 28.0 mmol/L   TCO2 33 (H) 22 - 32 mmol/L   O2 Saturation 91.0 %   Acid-Base Excess 5.0 (H) 0.0 - 2.0 mmol/L   Sodium 133 (L) 135 - 145 mmol/L   Potassium 3.6 3.5 - 5.1 mmol/L   Calcium, Ion 1.22 1.15 - 1.40 mmol/L   HCT 28.0 (L) 39.0 - 52.0 %   Hemoglobin 9.5 (L) 13.0 - 17.0 g/dL   Sample type ARTERIAL   Basic metabolic panel     Status: Abnormal   Collection Time: 03/25/20  7:21 AM  Result Value Ref Range   Sodium 132 (L) 135 - 145 mmol/L   Potassium 3.5 3.5 - 5.1 mmol/L   Chloride 90 (L) 98 - 111 mmol/L   CO2 29 22 - 32 mmol/L   Glucose, Bld 107 (H) 70 - 99 mg/dL    Comment: Glucose reference range applies only to samples taken after fasting for at least 8 hours.   BUN 20 8 - 23 mg/dL   Creatinine, Ser 1.40 (H) 0.61 - 1.24 mg/dL   Calcium 9.1 8.9 - 10.3 mg/dL   GFR calc non Af Amer 50 (L) >60 mL/min   GFR calc Af Amer 58 (L) >60  mL/min   Anion gap 13 5 - 15    Comment:  Performed at [facility], [place] [address], [place], [place] [address]  CBC     Status: Abnormal   Collection Time: 03/25/20  7:21 AM  Result Value Ref Range   WBC 6.8 4.0 - 10.5 K/uL   RBC 3.10 (L) 4.22 - 5.81 MIL/uL   Hemoglobin 8.7 (L) 13.0 - 17.0 g/dL   HCT 27.7 (L) 39.0 - 52.0 %   MCV 89.4 80.0 - 100.0 fL   MCH 28.1 26.0 - 34.0 pg   MCHC 31.4 30.0 - 36.0 g/dL   RDW 14.4 11.5 - 15.5 %   Platelets 320 150 - 400 K/uL   nRBC 0.0 0.0 - 0.2 %    Comment: Performed at [facility], [place] [address] [place], [place] [address]  Heparin level (unfractionated)     Status: None   Collection Time: 03/25/20  7:21 AM  Result Value Ref Range   Heparin Unfractionated 0.45 0.30 - 0.70 IU/mL    Comment: (NOTE) If heparin results are below expected values, and patient dosage has  been confirmed, suggest follow up testing of antithrombin III levels. Performed at [facility], [place] [address], [place], [place] [address]   Surgical pcr screen     Status: None   Collection Time: 03/25/20  8:54 AM   Specimen: Nasal Mucosa; Nasal Swab  Result Value Ref Range   MRSA, PCR NEGATIVE NEGATIVE   Staphylococcus aureus NEGATIVE NEGATIVE    Comment: (NOTE) The Xpert SA Assay (FDA approved for NASAL specimens in patients 64 years of age and older), is one component of a comprehensive surveillance program. It is not intended to diagnose infection nor to guide or monitor treatment. Performed at [facility], [place] [address], [place], [place] [address]   Urinalysis, Routine w reflex microscopic     Status: Abnormal   Collection Time: 03/25/20 12:55 PM  Result Value Ref Range   Color, Urine STRAW (A) YELLOW   APPearance CLEAR CLEAR   Specific Gravity, Urine 1.009 1.005 - 1.030   pH 7.0 5.0 - 8.0   Glucose, UA NEGATIVE NEGATIVE mg/dL   Hgb urine dipstick NEGATIVE NEGATIVE   Bilirubin Urine NEGATIVE NEGATIVE   Ketones, ur NEGATIVE NEGATIVE  mg/dL   Protein, ur NEGATIVE NEGATIVE mg/dL   Nitrite NEGATIVE NEGATIVE   Leukocytes,Ua NEGATIVE NEGATIVE    Comment: Performed at [facility] [address] [place], [place] [address]  Heparin level (unfractionated)     Status: None   Collection Time: 03/25/20  1:09 PM  Result Value Ref Range   Heparin Unfractionated 0.58 0.30 - 0.70 IU/mL    Comment: (NOTE) If heparin results are below expected values, and patient dosage has  been confirmed, suggest follow up testing of antithrombin III levels. Performed at [facility], [place] [address] [place], [place] [address]   Heparin level (unfractionated)     Status: Abnormal   Collection Time: 03/26/20  3:36 AM  Result Value Ref Range   Heparin Unfractionated 0.79 (H) 0.30 - 0.70 IU/mL    Comment: (NOTE) If heparin results are below expected values, and patient dosage has  been confirmed, suggest follow up testing of antithrombin III levels. Performed at [facility], [place] [address] [place], [place] [address]   CBC     Status: Abnormal   Collection Time: 03/26/20  3:36 AM  Result Value Ref Range   WBC 6.3 4.0 - 10.5 K/uL   RBC 3.23 (  L) 4.22 - 5.81 MIL/uL   Hemoglobin 8.9 (L) 13.0 - 17.0 g/dL   HCT 28.6 (L) 39.0 - 52.0 %   MCV 88.5 80.0 - 100.0 fL   MCH 27.6 26.0 - 34.0 pg   MCHC 31.1 30.0 - 36.0 g/dL   RDW 14.3 11.5 - 15.5 %   Platelets 306 150 - 400 K/uL   nRBC 0.0 0.0 - 0.2 %    Comment: Performed at [facility] [address], [place], [place] [address]  Protime-INR     Status: None   Collection Time: 03/26/20  3:36 AM  Result Value Ref Range   Prothrombin Time 14.2 11.4 - 15.2 seconds   INR 1.1 0.8 - 1.2    Comment: (NOTE) INR goal varies based on device and disease states. Performed at [facility], [place] [address], [place], [place] [address]   TSH     Status: None   Collection Time: 03/26/20  3:36 AM  Result Value Ref Range   TSH 1.157 0.350 - 4.500 uIU/mL    Comment: Performed by  a 3rd Generation assay with a functional sensitivity of <=0.01 uIU/mL. Performed at [facility], [place] [address], [place], [place] [address]   Hemoglobin A1c     Status: None   Collection Time: 03/26/20  3:36 AM  Result Value Ref Range   Hgb A1c MFr Bld 4.9 4.8 - 5.6 %    Comment: (NOTE) Pre diabetes:          5.7%-6.4% Diabetes:              >6.4% Glycemic control for   <7.0% adults with diabetes    Mean Plasma Glucose 93.93 mg/dL    Comment: Performed at [facility] [address], [place], [place] [address]    ECG   Sinus rhythm with anterior infarct (5/29) - Personally Reviewed  Telemetry   Sinus rhythm - Personally Reviewed  Radiology    CARDIAC CATHETERIZATION  Result Date: 03/24/2020  Ost RCA to Prox RCA lesion is 99% stenosed.  Prox RCA lesion is 80% stenosed.  Mid RCA to Dist RCA lesion is 20% stenosed.  Ost LAD to Prox LAD lesion is 80% stenosed.  Ost Cx to Prox Cx lesion is 50% stenosed.  1st Diag lesion is 70% stenosed.  Dist LAD lesion is 30% stenosed.  Ost LM to Ost LAD lesion is 90% stenosed.  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 Elevated filling pressures consistent with continued volume overload. Recommendations: Will consult CT surgery for CABG. Will resume IV Lasix tonight.   DG Chest Port 1 View  Result Date: 03/26/2020 CLINICAL DATA:  Non ST elevation MI EXAM: PORTABLE CHEST 1 VIEW COMPARISON:  03/21/2020 FINDINGS: Improved interstitial opacity and pleural effusions. Stable borderline heart size accentuated by portable technique. No pneumothorax IMPRESSION: Improved aeration with diminished pleural effusions. Electronically Signed   By: Monte Fantasia M.D.   On: 03/26/2020 08:44    Cardiac Studies   See above  Assessment   Principal Problem:   Acute CHF (congestive heart failure) (HCC) Active Problems:   Essential hypertension   Seizure  disorder (HCC)   Mixed hyperlipidemia   Chronic osteomyelitis of mandible   Orthostatic hypotension   GERD without esophagitis   Elevated troponin level not due myocardial infarction   Acute congestive heart failure (HCC)   Acute systolic CHF (congestive  heart failure), NYHA class 3 ([place])   Ischemic cardiomyopathy   Coronary artery disease involving native coronary artery of native heart with unstable angina pectoris (HCC)   Malnutrition of moderate degree   Plan   Appears medically optimized for surgery tomorrow - net negative another 1.7L yesterday due to persistently elevated filling pressure on RHC. He can lay flat today, no dyspnea. Repeat creatinine 1.4 this am.  Time Spent Directly with Patient:  I have spent a total of 25 minutes with the patient reviewing hospital notes, telemetry, EKGs, labs and examining the patient as well as establishing an assessment and plan that was discussed personally with the patient.  > 50% of time was spent in direct patient care.  Length of Stay:  LOS: 5 days   Pixie Casino, MD, [facility], [place] Director of the Advanced Lipid Disorders &  Cardiovascular Risk Reduction Clinic Diplomate of the American Board of Clinical Lipidology Attending Cardiologist  Direct Dial: [phone]  Fax: [phone]  Website:  [email or web address] Jiyan Walkowski 03/26/2020, 9:17 AM

## 2020-03-26 NOTE — Progress Notes (Signed)
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  They understand because of his significant LV dysfunction that he may need mechanical postoperative support.  Objective: Vital signs in last 24 hours: Temp:  [97.7 F (36.5 C)-98.7 F (37.1 C)] 97.7 F (36.5 C) (06/03 1658) Pulse Rate:  [73-80] 77 (06/03 1745) Cardiac Rhythm: Normal sinus rhythm (06/03 0903) Resp:  [14-19] 19 (06/03 1658) BP: (82-110)/(48-75) 106/67 (06/03 1745) SpO2:  [95 %-98 %] 96 % (06/03 1730) Weight:  [85.1 kg] 85.1 kg (06/03 0446)  Hemodynamic parameters for last 24 hours:    Intake/Output from previous day: 06/02 0701 - 06/03 0700 In: 1320 [P.O.:1320] Out: 3025 [Urine:3025] Intake/Output this shift: Total I/O In: 488.2 [I.V.:488.2] Out: 1475 [Urine:1475]  Exam Normal sinus rhythm No murmur or gallop Extremities warm with minimal edema Neuro intact  Lab Results: Recent Labs    03/25/20 0721 03/26/20 0336  WBC 6.8 6.3  HGB 8.7* 8.9*  HCT 27.7* 28.6*  PLT 320 306   BMET:  Recent Labs    03/25/20 0721 03/26/20 0932  NA 132* 134*  K 3.5 3.3*  CL 90* 91*  CO2 29 30  GLUCOSE 107* 106*  BUN 20 24*  CREATININE 1.40* 1.54*  CALCIUM 9.1 9.0    PT/INR:  Recent Labs    03/26/20 0336  LABPROT 14.2  INR 1.1   ABG    Component Value Date/Time   PHART 7.401 03/24/2020 1634   HCO3 31.1 (H) 03/24/2020 1634   TCO2 33 (H) 03/24/2020 1634   O2SAT 91.0 03/24/2020 1634   CBG (last 3)  No results for input(s):  GLUCAP in the last 72 hours.  Assessment/Plan: S/P Procedure(s) (LRB): RIGHT/LEFT HEART CATH AND CORONARY ANGIOGRAPHY (N/A) Multivessel CABG planned in a.m.   LOS: 5 days    Connor [name] 03/26/2020

## 2020-03-26 NOTE — Progress Notes (Signed)
Pre-CABG Dopplers completed. Refer to "CV Proc" under chart review to view preliminary results.  03/26/2020 10:49 AM Kelby Aline., MHA, RVT, RDCS, RDMS

## 2020-03-26 NOTE — Anesthesia Preprocedure Evaluation (Addendum)
Anesthesia Evaluation  Patient identified by MRN, date of birth, ID band Patient awake    Reviewed: Allergy & Precautions, NPO status , Patient's Chart, lab work & pertinent test results  Airway Mallampati: III  TM Distance: >3 FB Neck ROM: Limited  Mouth opening: Limited Mouth Opening  Dental  (+) Dental Advisory Given   Pulmonary  Head and neck CA s/p radiation in 2009   breath sounds clear to auscultation       Cardiovascular hypertension, + angina + CAD and +CHF   Rhythm:Regular Rate:Normal     Neuro/Psych Seizures -,  CVA    GI/Hepatic Neg liver ROS, GERD  ,  Endo/Other  negative endocrine ROS  Renal/GU 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 Akinesis of the anteroseptal, apical and basal inferior walls with  overall severe LV dysfunction; grade 2 diastolic dysfunction; mild LVE;  trace AI.  2. Left ventricular ejection fraction, by estimation, is 25 to 30%. The  left ventricle has severely decreased function. The left ventricle  demonstrates regional wall motion abnormalities (see scoring  diagram/findings for description). The left  ventricular internal cavity size was mildly dilated. Left ventricular  diastolic parameters are consistent with Grade II diastolic dysfunction  (pseudonormalization). Elevated left atrial pressure.  3. Right ventricular  systolic function is normal. The right ventricular  size is normal. There is mildly elevated pulmonary artery systolic  pressure.  4. The mitral valve is normal in structure. No evidence of mitral valve  regurgitation. No evidence of mitral stenosis.  5. The aortic valve is normal in structure. Aortic valve regurgitation is  trivial. Mild to moderate aortic valve sclerosis/calcification is present,  without any evidence of aortic stenosis.  6. The inferior vena cava is normal in size with greater than 50%  respiratory variability, suggesting right atrial pressure of 3 mmHg.  Anesthesia Physical Anesthesia Plan  ASA: IV  Anesthesia Plan: General   Post-op Pain Management:    Induction: Intravenous  PONV Risk Score and Plan: 2 and Dexamethasone, Ondansetron and Treatment may vary due to age or medical condition  Airway Management Planned: Oral ETT and Video Laryngoscope Planned  Additional Equipment: Arterial line, CVP, PA Cath, TEE and Ultrasound Guidance Line Placement  Intra-op Plan:   Post-operative Plan: Post-operative intubation/ventilation  Informed Consent: I have reviewed the patients History and Physical, chart, labs and discussed the procedure including the risks, benefits and alternatives for the proposed anesthesia with the patient or authorized representative who has indicated his/her understanding and acceptance.     Dental advisory given  Plan Discussed with: CRNA  Anesthesia Plan Comments:        Anesthesia Quick Evaluation

## 2020-03-26 NOTE — Care Management Important Message (Signed)
Important Message  Patient Details  Name: Connor [name] MRN: [id number] Date of Birth: November 01, 1945   Medicare Important Message Given:  Yes     Shelda Altes 03/26/2020, 3:55 PM

## 2020-03-26 NOTE — Progress Notes (Signed)
[facility] for Heparin Indication: CAD  Allergies  Allergen Reactions  . Morphine And Related Nausea And Vomiting    Patient Measurements: Height: 5\' 10"  (177.8 cm) Weight: 85.1 kg (187 lb 9.6 oz) IBW/kg (Calculated) : 73 Heparin Dosing Weight: 85.6 kg  Vital Signs: Temp: 98.2 F (36.8 C) (06/03 1925) Temp Source: Oral (06/03 1925) BP: 102/68 (06/03 1925) Pulse Rate: 80 (06/03 1925)  Labs: Recent Labs    03/24/20 0342 03/24/20 1633 03/24/20 1634 03/24/20 1634 03/25/20 0721 03/25/20 1309 03/26/20 0336 03/26/20 0932 03/26/20 1132 03/26/20 2043  HGB 8.7*   < > 9.5*   < > 8.7*  --  8.9*  --   --   --   HCT 28.2*   < > 28.0*  --  27.7*  --  28.6*  --   --   --   PLT 303  --   --   --  320  --  306  --   --   --   LABPROT  --   --   --   --   --   --  14.2  --   --   --   INR  --   --   --   --   --   --  1.1  --   --   --   HEPARINUNFRC  --   --   --   --  0.45   < > 0.79*  --  0.77* 0.41  CREATININE 1.29*  --   --   --  1.40*  --   --  1.54*  --   --    < > = values in this interval not displayed.    Estimated Creatinine Clearance: 44.8 mL/min (A) (by C-G formula based on SCr of 1.54 mg/dL (H)).  Assessment: 74 y.o male S/P cath 6/1 showing severe triple-vessel CAD. Heparin was started post cath, pending CABG planned for 03/27/20 AM.   Heparin level ~8 hrs after heparin infusion was decreased to 1100 units/hr was 0.41 units/ml, which is within the goal range for this pt. CBC stable.  Per RN, no issues with IV or bleeding observed.  Goal of Therapy:  Heparin level 0.3-0.7 units/ml Monitor platelets by anticoagulation protocol: Yes   Plan:  Continue heparin at 1100 units/hr Check confirmatory heparin level in 6-8 hrs Monitor daily heparin level, CBC Monitor for signs/symptoms of bleeding  Gillermina Hu, PharmD, BCPS, [facility] Clinical Pharmacist 03/26/2020 9:57 PM

## 2020-03-26 NOTE — Progress Notes (Signed)
[facility] PROGRESS NOTE    Progress Note  Connor [name]  [id number] DOB: Mar 17, 1946 DOA: 03/21/2020 PCP: Josetta Huddle, MD     Brief Narrative:   Connor [name] is an 74 y.o. 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  Unstable angina: Basically flat denies any chest pain with activity. Left heart cath was done that showed severe triple-vessel disease cardiology recommended CT surgery consultation. Cardiothoracic surgery evaluated the patient and recommended surgical intervention on 03/27/2020  History of seizure disorder: Continue Lamictal.  Chronic osteomyelitis of the mandible: With recent surgery [facility], [facility], [facility] follow-up for bleeding complications. Continue chronic levofloxacin.  Orthostatic hypotension: Continue low-dose midodrine TED hose.  History of metastatic prostate cancer/history of squamous cell carcinoma of the left tonsil status post radiation and surgery: Follow-up with [facility] as an outpatient.    DVT prophylaxis: lovenox Family Communication:none Status is: Inpatient  Remains inpatient appropriate because:Hemodynamically unstable   Dispo: The patient is from: Home              Anticipated d/c is to: SNF              Anticipated d/c date is: > 3 days              Patient currently is not medically stable to d/c.  Code Status:     Code Status Orders  (From admission,  onward)         Start     Ordered   03/24/20 1144  Full code  Continuous     03/24/20 1143        Code Status History    Date Active Date Inactive Code Status Order ID Comments User Context   03/22/2020 1025 03/24/2020 1143 DNR [id number]  Domenic Polite, MD ED   03/21/2020 2204 03/22/2020 1024 Full Code [id number]  Vernelle Emerald, MD ED   02/24/2014 1222 02/26/2014 1358 Full Code [id number]  Aluisio, Dione Plover, MD Inpatient   Advance Care Planning Activity    Advance Directive Documentation     Most Recent Value  Type of Advance Directive  Healthcare Power of [place], Living will  Pre-existing out of facility DNR order (yellow form or pink MOST form)  --  "MOST" Form in Place?  --        IV Access:    Peripheral IV   Procedures and diagnostic studies:   CARDIAC CATHETERIZATION  Result Date: 03/24/2020  Ost RCA to Prox RCA lesion is 99% stenosed.  Prox RCA lesion is 80% stenosed.  Mid RCA to Dist RCA lesion is 20% stenosed.  Ost LAD to Prox LAD lesion is 80% stenosed.  Ost Cx to Prox Cx lesion is 50% stenosed.  1st Diag lesion is 70% stenosed.  Dist LAD lesion is 30% stenosed.  Ost LM to Ost LAD lesion is 90% stenosed.  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  Elevated filling pressures consistent with continued volume overload. Recommendations: Will consult CT surgery for CABG. Will resume IV Lasix tonight.   DG Chest Port 1 View  Result Date: 03/26/2020 CLINICAL DATA:  Non ST elevation MI EXAM: PORTABLE CHEST 1 VIEW COMPARISON:  03/21/2020 FINDINGS: Improved interstitial opacity and pleural effusions. Stable borderline heart size accentuated by portable technique. No pneumothorax IMPRESSION: Improved aeration with diminished pleural effusions. Electronically Signed   By: Monte Fantasia M.D.   On: 03/26/2020 08:44     Medical Consultants:    None.  Anti-Infectives:    none  Subjective:    Connor [name] no new complaints today.  Objective:    Vitals:   03/25/20 1536 03/25/20 1928 03/26/20 0440 03/26/20 0446  BP: 107/72 107/68 95/61   Pulse: 85 80 78   Resp: 16 15 17    Temp: (!) 97.4 F (36.3 C) 98 F (36.7 C) 98.1 F (36.7 C)   TempSrc: Oral Oral Oral   SpO2: 94% 98% 95%   Weight:    85.1 kg  Height:       SpO2: 95 % O2 Flow Rate (L/min): 2 L/min   Intake/Output Summary (Last 24 hours) at 03/26/2020 1032 Last data filed at 03/26/2020 0600 Gross per 24 hour  Intake 960 ml  Output 2500 ml  Net -1540 ml   Filed Weights   03/24/20 0427 03/25/20 0353 03/26/20 0446  Weight: 87.6 kg 85.6 kg 85.1 kg    Exam: General exam: In no acute distress. Respiratory system: Good air movement and clear to auscultation. Cardiovascular system: S1 & S2 heard, RRR. No JVD. Gastrointestinal system: Abdomen is nondistended, soft and nontender.  Extremities: No pedal edema. Skin: No rashes, lesions or ulcers Psychiatry: Judgement and insight appear normal. Mood & affect appropriate. Data Reviewed:    Labs: Basic Metabolic Panel: Recent Labs  Lab 03/21/20 1636 03/21/20 1636 03/22/20 0403 03/22/20 0403 03/23/20 [id number] 03/23/20 [id number] 03/24/20 [id number] 03/24/20 [id number] 03/24/20 1633 03/24/20 1633 03/24/20 1634 03/25/20 0721  NA 130*   < > 133*   < > 135  --  132*  --  133*  --  133* 132*  K 4.8   < > 4.4   < > 3.5   < > 3.7   < > 3.6   < > 3.6 3.5  CL 92*  --  94*  --  92*  --  90*  --   --   --   --  90*  CO2 27  --  29  --  32  --  30  --   --   --   --  29  GLUCOSE 132*  --  114*  --  103*  --  109*  --   --   --   --  107*  BUN 24*  --  19  --  21  --  20  --   --   --   --  20  CREATININE 1.11  --  1.17  --  1.31*  --  1.29*  --   --   --   --  1.40*  CALCIUM 9.2  --  9.1  --  9.2  --  9.2  --   --   --   --  9.1  MG  --   --  1.9  --   --   --   --   --   --   --   --   --    < > =  values in this interval not displayed.    GFR Estimated Creatinine Clearance: 49.2 mL/min (A) (by C-G formula based on SCr of 1.4 mg/dL (H)). Liver Function Tests: Recent Labs  Lab 03/21/20 1730 03/22/20 0403 03/23/20 0429  AST 21 19 17   ALT 13 12 11   ALKPHOS 84 78 75  BILITOT 0.4 0.6 0.9  PROT 6.8 6.1* 6.2*  ALBUMIN 3.6 3.2* 3.1*   No results for input(s): LIPASE, AMYLASE in the last 168 hours. No results for input(s): AMMONIA in the last 168 hours. Coagulation profile Recent Labs  Lab 03/26/20 0336  INR 1.1   COVID-19 Labs  No results for input(s): DDIMER, FERRITIN, LDH, CRP in the last 72 hours.  Lab Results  Component Value Date   [place] NEGATIVE 03/21/2020   [place] NEGATIVE 02/28/2020    CBC: Recent Labs  Lab 03/22/20 0403 03/22/20 0403 03/23/20 0429 03/23/20 0429 03/24/20 0342 03/24/20 1633 03/24/20 1634 03/25/20 0721 03/26/20 0336  WBC 6.1  --  7.0  --  6.4  --   --  6.8 6.3  NEUTROABS 3.9  --   --   --   --   --   --   --   --   HGB 8.4*   < > 8.6*   < > 8.7* 9.5* 9.5* 8.7* 8.9*  HCT 26.6*   < > 27.4*   < > 28.2* 28.0* 28.0* 27.7* 28.6*  MCV 91.4  --  91.0  --  89.8  --   --  89.4 88.5  PLT 275  --  287  --  303  --   --  320 306   < > = values in this interval not displayed.   Cardiac Enzymes: No results for input(s): CKTOTAL, CKMB, CKMBINDEX, TROPONINI in the last 168 hours. BNP (last 3 results) No results for input(s): PROBNP in the last 8760 hours. CBG: No results for input(s): GLUCAP in the last 168 hours. D-Dimer: No results for input(s): DDIMER in the last 72 hours. Hgb A1c: Recent Labs    03/26/20 0336  HGBA1C 4.9   Lipid Profile: No results for input(s): CHOL, HDL, LDLCALC, TRIG, CHOLHDL, LDLDIRECT in the last 72 hours. Thyroid function studies: Recent Labs    03/26/20 0336  TSH 1.157   Anemia work up: No results for input(s): VITAMINB12, FOLATE, FERRITIN, TIBC, IRON, RETICCTPCT in the last 72 hours. Sepsis Labs: Recent Labs  Lab 03/23/20 0429  03/24/20 0342 03/25/20 0721 03/26/20 0336  WBC 7.0 6.4 6.8 6.3   Microbiology Recent Results (from the past 240 hour(s))  SARS Coronavirus 2 by RT PCR (hospital order, performed in [facility] hospital lab) Nasopharyngeal Nasopharyngeal Swab     Status: None   Collection Time: 03/21/20  7:08 PM   Specimen: Nasopharyngeal Swab  Result Value Ref Range Status   SARS Coronavirus 2 NEGATIVE NEGATIVE Final    Comment: (NOTE) SARS-CoV-2 target nucleic acids are NOT DETECTED. The SARS-CoV-2 RNA is generally detectable in upper and lower respiratory specimens during the acute phase of infection. The lowest concentration of SARS-CoV-2 viral copies this assay can detect is 250 copies / mL. A negative result does not preclude SARS-CoV-2 infection and should not be used as the sole basis for treatment or other patient management decisions.  A negative result may occur with improper specimen collection / handling, submission of specimen other than nasopharyngeal swab, presence of viral mutation(s) within the areas targeted by this assay, and inadequate number of viral copies (<250 copies /  mL). A negative result must be combined with clinical observations, patient history, and epidemiological information. Fact Sheet for Patients:   StrictlyIdeas.no Fact Sheet for Healthcare Providers: BankingDealers.co.za This test is not yet approved or cleared  by the [place] FDA and has been authorized for detection and/or diagnosis of SARS-CoV-2 by FDA under an Emergency Use Authorization (EUA).  This EUA will remain in effect (meaning this test can be used) for the duration of the COVID-19 declaration under Section 564(b)(1) of the Act, 21 U.S.C. section 360bbb-3(b)(1), unless the authorization is terminated or revoked sooner. Performed at [facility], [place] [address], [place], [place] [address]   Culture, blood (routine x 2)     Status: None  (Preliminary result)   Collection Time: 03/21/20 10:15 PM   Specimen: BLOOD RIGHT HAND  Result Value Ref Range Status   Specimen Description BLOOD RIGHT HAND  Final   Special Requests   Final    BOTTLES DRAWN AEROBIC AND ANAEROBIC Blood Culture results may not be optimal due to an inadequate volume of blood received in culture bottles   Culture   Final    NO GROWTH 4 DAYS Performed at [facility], [place] [address] [place], [place] [address]    Report Status PENDING  Incomplete  Culture, blood (routine x 2)     Status: None (Preliminary result)   Collection Time: 03/21/20 10:21 PM   Specimen: BLOOD  Result Value Ref Range Status   Specimen Description BLOOD RIGHT ARM  Final   Special Requests   Final    BOTTLES DRAWN AEROBIC AND ANAEROBIC Blood Culture results may not be optimal due to an inadequate volume of blood received in culture bottles   Culture   Final    NO GROWTH 4 DAYS Performed at [facility], [place] [address], [place], [place] [address]    Report Status PENDING  Incomplete  Surgical pcr screen     Status: None   Collection Time: 03/25/20  8:54 AM   Specimen: Nasal Mucosa; Nasal Swab  Result Value Ref Range Status   MRSA, PCR NEGATIVE NEGATIVE Final   Staphylococcus aureus NEGATIVE NEGATIVE Final    Comment: (NOTE) The Xpert SA Assay (FDA approved for NASAL specimens in patients 65 years of age and older), is one component of a comprehensive surveillance program. It is not intended to diagnose infection nor to guide or monitor treatment. Performed at [facility], [place] [address] [place], [place] [address]      Medications:   . aspirin  81 mg Oral Daily  . bisacodyl  5 mg Oral Once  . chlorhexidine  60 mL Topical Once   And  . [START ON 03/27/2020] chlorhexidine  60 mL Topical Once  . chlorhexidine  15 mL Mouth Rinse BID  . [START ON 03/27/2020] chlorhexidine  15 mL Mouth/Throat Once  . [START ON 03/27/2020] diazepam  2 mg Oral Once  . [START  ON 03/27/2020] epinephrine  0-10 mcg/min Intravenous To OR  . feeding supplement  1 Container Oral Q24H  . feeding supplement (ENSURE ENLIVE)  237 mL Oral BID BM  . furosemide  40 mg Intravenous Q12H  . [START ON 03/27/2020] heparin-papaverine-plasmalyte irrigation   Irrigation To OR  . [START ON 03/27/2020] insulin   Intravenous To OR  . lamoTRIgine  100 mg Oral q AM  . lamoTRIgine  200 mg Oral QPM  . levofloxacin  500 mg Oral Daily  . [START ON  03/27/2020] magnesium sulfate  40 mEq Other To OR  . [START ON 03/27/2020] metoprolol tartrate  12.5 mg Oral Once  . midodrine  5 mg Oral BID WC  . pantoprazole  40 mg Oral Daily  . [START ON 03/27/2020] phenylephrine  30-200 mcg/min Intravenous To OR  . [START ON 03/27/2020] potassium chloride  80 mEq Other To OR  . simvastatin  20 mg Oral QPM  . sodium chloride flush  3 mL Intravenous Q12H  . sodium chloride flush  3 mL Intravenous Q12H  . sodium chloride flush  3 mL Intravenous Q12H  . [START ON 03/27/2020] tranexamic acid  15 mg/kg Intravenous To OR  . [START ON 03/27/2020] tranexamic acid  2 mg/kg Intracatheter To OR   Continuous Infusions: . sodium chloride    . sodium chloride    . [START ON 03/27/2020] cefUROXime (ZINACEF)  IV    . [START ON 03/27/2020] cefUROXime (ZINACEF)  IV    . [START ON 03/27/2020] dexmedetomidine    . [START ON 03/27/2020] heparin 30,000 units/NS 1000 mL solution for CELLSAVER    . heparin 1,200 Units/hr (03/26/20 0451)  . [START ON 03/27/2020] milrinone    . [START ON 03/27/2020] nitroGLYCERIN    . [START ON 03/27/2020] norepinephrine    . [START ON 03/27/2020] tranexamic acid (CYKLOKAPRON) infusion ([facility])    . [START ON 03/27/2020] vancomycin        LOS: 5 days   Charlynne Cousins  [facility]  03/26/2020, 10:32 AM

## 2020-03-26 NOTE — Progress Notes (Signed)
[place] for heparin Indication: CAD  Allergies  Allergen Reactions  . Morphine And Related Nausea And Vomiting    Patient Measurements: Height: 5\' 10"  (177.8 cm) Weight: 85.1 kg (187 lb 9.6 oz) IBW/kg (Calculated) : 73 Heparin Dosing Weight: 85.6 kg  Vital Signs: Temp: 98.7 F (37.1 C) (06/03 1106) Temp Source: Oral (06/03 1106) BP: 82/69 (06/03 1106) Pulse Rate: 73 (06/03 1106)  Labs: Recent Labs    03/24/20 0342 03/24/20 1633 03/24/20 1634 03/24/20 1634 03/25/20 0721 03/25/20 0721 03/25/20 1309 03/26/20 0336 03/26/20 0932 03/26/20 1132  HGB 8.7*   < > 9.5*   < > 8.7*  --   --  8.9*  --   --   HCT 28.2*   < > 28.0*  --  27.7*  --   --  28.6*  --   --   PLT 303  --   --   --  320  --   --  306  --   --   LABPROT  --   --   --   --   --   --   --  14.2  --   --   INR  --   --   --   --   --   --   --  1.1  --   --   HEPARINUNFRC  --   --   --   --  0.45   < > 0.58 0.79*  --  0.77*  CREATININE 1.29*  --   --   --  1.40*  --   --   --  1.54*  --    < > = values in this interval not displayed.    Estimated Creatinine Clearance: 44.8 mL/min (A) (by C-G formula based on SCr of 1.54 mg/dL (H)).  Assessment: 74 y.o male s/p cath done yesterday 6/1. He has 3VCAD. Heparin was started post cath, pending CABG planned for 03/27/20 AM.   Heparin level still above goal at 0.77 after heparin drip decreased to 1200 ut/hr. Hgb low /stable, pltc wnl. No bleeding reported   Goal of Therapy:  Heparin level 0.3-0.7 units/ml Monitor platelets by anticoagulation protocol: Yes   Plan:  Reduce heparin to 1100 units/hr Recheck hep lvl in 6 hours  Nicole Cella, RPh Clinical Pharmacist [phone] Please check AMION for all [place] numbers 03/26/2020 12:42 PM

## 2020-03-26 NOTE — Consult Note (Signed)
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 Darcey Nora MRN #:  [id number]  History of Present Illness: This is a 74 y.o. 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  Patient initially presented with shortness of breath and was found to have heart failure exacerbation.  He ultimately has been evaluated with cardiac cath and found to have significant coronary disease and is scheduled for CABG tomorrow.  During his scheduled work-up for CABG he underwent duplex that showed reversed flow in the left ICA with suspected fistula to the IJ given pulsatile flow.  He denies any previous knowledge of this.  He denies any history of stroke in the past.  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  No neuro deficits otherwise.  Past Medical History:  Diagnosis Date  . Arthritis    OA AND PAIN RT KNEE  . Cancer ([place])    h/o neck - ABOUT 6 YRS AGO - TX'D WITH RADIATION AND CHEMO   . Chronic kidney disease   . GERD without esophagitis 03/21/2020  . Head and neck cancer ~ 2009   S/P radiation & [name]  . Headache(784.0)   . Hyperlipidemia   . Hypertension   . Kidney carcinoma ([place])    h/o - NEPHRECTOMY   . 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 Prostate cancer ([place]) 05/20/14   Gleason 4+3=7, volume 25 gm  . Radiation 2015   hx of, prostate cancer  . Seizures ([place])    hx of x yrs, "the bad kind;bite tongue; STATES LAST [place] 2013; WAS SEEING [name] - HE RETIRED AND PT LAST SAW DR. [name]  . Stroke ([place]) [place] 2013   UNABLE TO SPEAK OR MOVE AND RT SIDE WEAKNESS AND LOSS OF SKIN SENSITIVITY TO HEAT AND COLD ON RT SIDE, DOUBLE VISION. BALANCE  PROBLEMS---STATES STILL HAS DOUBLE VISION AND BALANCE PROBLEM AND RT SIDED LOSS OF SKIN SENSITIVITY    Past Surgical History:  Procedure Laterality Date  . hydrocelectomy  11/2000   left  . IR FLUORO GUIDE CV LINE RIGHT  08/31/2017  . IR US GUIDE VASC ACCESS RIGHT  08/31/2017  . JOINT REPLACEMENT     LEFT TOTAL KNEE ARTHROPLASTY  . MOUTH SURGERY  2019  . NEPHRECTOMY  1990's   left  . PROSTATE BIOPSY  05/20/14   Gleason 4+3=7, vol 25 gm  . RIGHT/LEFT HEART CATH AND CORONARY ANGIOGRAPHY N/A 03/24/2020   Procedure: RIGHT/LEFT HEART CATH AND CORONARY ANGIOGRAPHY;  Surgeon: Burnell Blanks, MD;  Location: [facility];  Service: Cardiovascular;  Laterality: N/A;  . TEE WITHOUT CARDIOVERSION  10/04/2011   Procedure: TRANSESOPHAGEAL ECHOCARDIOGRAM (TEE);  Surgeon: Candee Furbish, MD;  Location: [facility] ENDOSCOPY;  Service: Cardiovascular;  Laterality: N/A;  . TOTAL KNEE ARTHROPLASTY  2011   left  . TOTAL KNEE ARTHROPLASTY Right 02/24/2014   Procedure: RIGHT TOTAL KNEE ARTHROPLASTY;  Surgeon: Gearlean Alf, MD;  Location: [facility];  Service: Orthopedics;  Laterality: Right;    Allergies  Allergen Reactions  . Morphine And Related Nausea And Vomiting    Prior to Admission medications   Medication Sig Start Date End Date  Taking? Authorizing Provider  albuterol (VENTOLIN HFA) 108 (90 Base) MCG/ACT inhaler Inhale 2 puffs into the lungs in the morning, at noon, and at bedtime. 03/19/20  Yes [provider]  aspirin 81 MG tablet Take 81 mg by mouth daily.   Yes [provider]  Cyanocobalamin (VITAMIN B-12 PO) Take 1 tablet by mouth daily.   Yes [provider]  ferrous sulfate 325 (65 FE) MG tablet Take 325 mg by mouth 2 (two) times daily with a meal.   Yes [provider]  fluconazole (DIFLUCAN) 200 MG tablet Take 2 tablets (400 mg total) by mouth daily. 10/31/19  Yes Carlyle Basques, MD  fludrocortisone (FLORINEF) 0.1 MG tablet Take 100 mcg by mouth daily.  03/05/20  Yes [provider]  lamoTRIgine (LAMICTAL) 100 MG tablet 100mg  in AM and 200mg  in PM Patient taking differently: Take 100-200 mg by mouth See admin instructions. Take 1 tablet every morning and take 2 tablets every evening 02/26/19  Yes Penumalli, Vikram R, MD  Leuprolide Acetate, 3 Month, (LUPRON DEPOT, 39-MONTH, IM) Inject 1 each into the muscle every 3 (three) months.   Yes [provider]  levofloxacin (LEVAQUIN) 500 MG tablet Take 1 tablet (500 mg total) by mouth daily. Patient taking differently: Take 500 mg by mouth every evening.  03/03/20  Yes Carlyle Basques, MD  simvastatin (ZOCOR) 20 MG tablet Take 20 mg by mouth every evening.   Yes [provider]    Social History   Socioeconomic History  . Marital status: Divorced    Spouse name: Not on file  . Number of children: 2  . Years of education: 12th  . Highest education level: Not on file  Occupational History  . Occupation: Information systems manager: OTHER    Comment: [employer]  Tobacco Use  . Smoking status: Never Smoker  . Smokeless tobacco: Never Used  Substance and Sexual Activity  . Alcohol use: Yes    Alcohol/week: 0.0 standard drinks    Comment: WAS 6 TO 8 BEERS DAILY   . Drug use: No  . Sexual activity: Not Currently  Other Topics Concern  . Not on file  Social History Narrative   Patient is divorced with 2 children.   Patient is right handed.   Patient has hs education.   Patient drinks 2 cups daily.   Social Determinants of Health   Financial Resource Strain:   . Difficulty of Paying Living Expenses:   Food Insecurity:   . Worried About Charity fundraiser in the Last Year:   . Arboriculturist in the Last Year:   Transportation Needs:   . Film/video editor (Medical):   Marland Kitchen Lack of Transportation (Non-Medical):   Physical Activity:   . Days of Exercise per Week:   . Minutes of Exercise per Session:   Stress:   . Feeling of Stress :   Social Connections:    . Frequency of Communication with Friends and Family:   . Frequency of Social Gatherings with Friends and Family:   . Attends Religious Services:   . Active Member of Clubs or Organizations:   . Attends Archivist Meetings:   Marland Kitchen Marital Status:   Intimate Partner Violence:   . Fear of Current or Ex-Partner:   . Emotionally Abused:   Marland Kitchen Physically Abused:   . Sexually Abused:      Family History  Problem Relation Age of Onset  . Diabetes Mother   .  Heart disease Mother   . Heart disease Father     ROS: [x]  Positive   [ ]  Negative   [ ]  All sytems reviewed and are negative  Cardiovascular: []  chest pain/pressure []  palpitations []  SOB lying flat []  DOE []  pain in legs while walking []  pain in legs at rest []  pain in legs at night []  non-healing ulcers []  hx of DVT []  swelling in legs  Pulmonary: []  productive cough []  asthma/wheezing []  home O2  Neurologic: []  weakness in []  arms []  legs []  numbness in []  arms []  legs []  hx of CVA []  mini stroke [] difficulty speaking or slurred speech []  temporary loss of vision in one eye []  dizziness  Hematologic: []  hx of cancer []  bleeding problems []  problems with blood clotting easily  Endocrine:   []  diabetes []  thyroid disease  GI []  vomiting blood []  blood in stool  GU: []  CKD/renal failure []  HD--[]  M/W/F or []  T/T/S []  burning with urination []  blood in urine  Psychiatric: []  anxiety []  depression  Musculoskeletal: []  arthritis []  joint pain  Integumentary: []  rashes []  ulcers  Constitutional: []  fever []  chills   Physical Examination  Vitals:   03/26/20 0440 03/26/20 1106  BP: 95/61 (!) 82/69  Pulse: 78 73  Resp: 17 14  Temp: 98.1 F (36.7 C) 98.7 F (37.1 C)  SpO2: 95% 96%   Body mass index is 26.92 kg/m.  General:  WDWN in NAD Gait: Not observed HENT: WNL, normocephalic Pulmonary: normal non-labored breathing, without Rales, rhonchi,  wheezing Cardiac: regular,  without  Murmurs, rubs or gallops Abdomen: soft, NT/ND, no masses Vascular Exam/Pulses: Palpable carotid pulses Palpable radial pulses bilaterally Palpable femoral pulses bilaterally Left neck with significant scarring from radiation therapy Extremities: without ischemic changes, without Gangrene , without cellulitis; without open wounds;  Musculoskeletal: no muscle wasting or atrophy  Neurologic: A&O X 3; Appropriate Affect ; SENSATION: normal; MOTOR FUNCTION:  moving all extremities equally. Speech is fluent/normal   CBC    Component Value Date/Time   WBC 6.3 03/26/2020 0336   RBC 3.23 (L) 03/26/2020 0336   HGB 8.9 (L) 03/26/2020 0336   HCT 28.6 (L) 03/26/2020 0336   PLT 306 03/26/2020 0336   MCV 88.5 03/26/2020 0336   MCH 27.6 03/26/2020 0336   MCHC 31.1 03/26/2020 0336   RDW 14.3 03/26/2020 0336   LYMPHSABS 1.1 03/22/2020 0403   MONOABS 1.0 03/22/2020 0403   EOSABS 0.2 03/22/2020 0403   BASOSABS 0.0 03/22/2020 0403    BMET    Component Value Date/Time   NA 134 (L) 03/26/2020 0932   K 3.3 (L) 03/26/2020 0932   CL 91 (L) 03/26/2020 0932   CO2 30 03/26/2020 0932   GLUCOSE 106 (H) 03/26/2020 0932   BUN 24 (H) 03/26/2020 0932   CREATININE 1.54 (H) 03/26/2020 0932   CREATININE 1.18 06/24/2019 1155   CALCIUM 9.0 03/26/2020 0932   GFRNONAA 44 (L) 03/26/2020 0932   GFRNONAA 65 12/25/2017 1415   GFRAA 51 (L) 03/26/2020 0932   GFRAA 76 12/25/2017 1415    COAGS: Lab Results  Component Value Date   INR 1.1 03/26/2020   INR 1.1 02/28/2020   INR 0.99 02/17/2014     Non-Invasive Vascular Imaging:     I dependently reviewed his carotid duplex that shows reversed flow in the left ICA with likely arterialized flow in the left IJ.  He has no significant carotid disease otherwise noted on duplex.   ASSESSMENT/PLAN: This is  a 74 y.o. 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  Discussed with patient that this is likely an incidental finding and may be related to his history of tonsillar cancer as well as radiation therapy to the left neck.  I have discussed this case with cardiology Dr. Debara Pickett and his heart failure is obviously felt to be ischemic cardiomyopathy with planned CABG tomorrow.  There is no evidence at this time that this is contributing to his heart failure and he has responded to diuresis.  I think he can proceed with CABG as planned and would not plan on any immediate operative intervention for this.  If there is ongoing concern about his heart failure not being optimized after cardiac surgery could certainly entertain arteriogram to see if there is option for covered stent.  Would certainly never offer open surgery given the amount of neck radiation and friable tissue.  Marty Heck, MD [facility] Office: [place]

## 2020-03-26 NOTE — Progress Notes (Signed)
[place] for heparin Indication: CAD  Allergies  Allergen Reactions  . Morphine And Related Nausea And Vomiting    Patient Measurements: Height: 5\' 10"  (177.8 cm) Weight: 85.6 kg (188 lb 11.2 oz) IBW/kg (Calculated) : 73 Heparin Dosing Weight: 85.6 kg  Vital Signs: Temp: 98 F (36.7 C) (06/02 1928) Temp Source: Oral (06/02 1928) BP: 107/68 (06/02 1928) Pulse Rate: 80 (06/02 1928)  Labs: Recent Labs    03/23/20 0429 03/23/20 0429 03/24/20 0342 03/24/20 1633 03/24/20 1634 03/24/20 1634 03/25/20 0721 03/25/20 1309 03/26/20 0336  HGB 8.6*   < > 8.7*   < > 9.5*   < > 8.7*  --  8.9*  HCT 27.4*   < > 28.2*   < > 28.0*  --  27.7*  --  28.6*  PLT 287   < > 303  --   --   --  320  --  306  LABPROT  --   --   --   --   --   --   --   --  14.2  INR  --   --   --   --   --   --   --   --  1.1  HEPARINUNFRC  --   --   --   --   --   --  0.45 0.58 0.79*  CREATININE 1.31*  --  1.29*  --   --   --  1.40*  --   --    < > = values in this interval not displayed.    Estimated Creatinine Clearance: 49.2 mL/min (A) (by C-G formula based on SCr of 1.4 mg/dL (H)).  Assessment: 74 y.o male s/p cath done yesterday 6/1. He has 3VCAD. Heparin was started post cath, pending CABG  Hep lvl now high at 0.79  Cbc low but stable  Goal of Therapy:  Heparin level 0.3-0.7 units/ml Monitor platelets by anticoagulation protocol: Yes   Plan:  Reduce heparin to 1200 units/hr Recheck hep lvl at [place], PharmD, BCPS, BCCCP Clinical Pharmacist [phone]  Please check AMION for all [place] numbers  03/26/2020 4:29 AM

## 2020-03-26 NOTE — Progress Notes (Signed)
Will hold ambulation due to severe LM disease. Pt has done some ambulating in hall and room with RW. He tells me he has a handicap apartment on his son's property. His son Merry Proud is present and sts someone can be with him at d/c if he is discharged home. Discussed sternal precautions, mobility post op, d/c planning, and IS. He was able to inspire 2500 mL on IS. Pt and son receptive and will review OHS booklet, careguide and preop video.  [place], ACSM 2:31 PM 03/26/2020

## 2020-03-26 NOTE — Progress Notes (Signed)
Spoke to patient and patient's son at the beside. Patient's son verbalizes understanding that the patient will not return back onto our unit and will take the patient's belongings home tonight when they leave the bedside. Patient's inform consent about the procedure and blood consent has been signed and placed in the patient's chart.

## 2020-03-27 ENCOUNTER — Inpatient Hospital Stay (HOSPITAL_COMMUNITY): Payer: PPO

## 2020-03-27 ENCOUNTER — Other Ambulatory Visit: Payer: Self-pay | Admitting: Diagnostic Neuroimaging

## 2020-03-27 ENCOUNTER — Inpatient Hospital Stay (HOSPITAL_COMMUNITY)
Admission: EM | Disposition: A | Discharge status: Home Health | Payer: Self-pay | Source: Home / Self Care | Attending: Cardiothoracic Surgery

## 2020-03-27 ENCOUNTER — Inpatient Hospital Stay (HOSPITAL_COMMUNITY): Payer: PPO | Admitting: Anesthesiology

## 2020-03-27 DIAGNOSIS — Z951 Presence of aortocoronary bypass graft: Secondary | ICD-10-CM

## 2020-03-27 DIAGNOSIS — I2510 Atherosclerotic heart disease of native coronary artery without angina pectoris: Secondary | ICD-10-CM | POA: Diagnosis present

## 2020-03-27 HISTORY — PX: CORONARY ARTERY BYPASS GRAFT: SHX141

## 2020-03-27 HISTORY — PX: TEE WITHOUT CARDIOVERSION: SHX5443

## 2020-03-27 LAB — POCT I-STAT 7, (LYTES, BLD GAS, ICA,H+H)
Acid-Base Excess: 9 mmol/L — ABNORMAL HIGH (ref 0.0–2.0)
Acid-Base Excess: 5 mmol/L — ABNORMAL HIGH (ref 0.0–2.0)
Acid-Base Excess: 7 mmol/L — ABNORMAL HIGH (ref 0.0–2.0)
Acid-Base Excess: 2 mmol/L (ref 0.0–2.0)
Acid-Base Excess: 1 mmol/L (ref 0.0–2.0)
Bicarbonate: 33.8 mmol/L — ABNORMAL HIGH (ref 20.0–28.0)
Bicarbonate: 28.6 mmol/L — ABNORMAL HIGH (ref 20.0–28.0)
Bicarbonate: 30.3 mmol/L — ABNORMAL HIGH (ref 20.0–28.0)
Bicarbonate: 26.5 mmol/L (ref 20.0–28.0)
Bicarbonate: 24.8 mmol/L (ref 20.0–28.0)
Calcium, Ion: 1.18 mmol/L (ref 1.15–1.40)
Calcium, Ion: 0.97 mmol/L — ABNORMAL LOW (ref 1.15–1.40)
Calcium, Ion: 1.05 mmol/L — ABNORMAL LOW (ref 1.15–1.40)
Calcium, Ion: 1.37 mmol/L (ref 1.15–1.40)
Calcium, Ion: 1.15 mmol/L (ref 1.15–1.40)
HCT: 27 % — ABNORMAL LOW (ref 39.0–52.0)
HCT: 23 % — ABNORMAL LOW (ref 39.0–52.0)
HCT: 26 % — ABNORMAL LOW (ref 39.0–52.0)
HCT: 26 % — ABNORMAL LOW (ref 39.0–52.0)
HCT: 25 % — ABNORMAL LOW (ref 39.0–52.0)
Hemoglobin: 9.2 g/dL — ABNORMAL LOW (ref 13.0–17.0)
Hemoglobin: 7.8 g/dL — ABNORMAL LOW (ref 13.0–17.0)
Hemoglobin: 8.8 g/dL — ABNORMAL LOW (ref 13.0–17.0)
Hemoglobin: 8.8 g/dL — ABNORMAL LOW (ref 13.0–17.0)
Hemoglobin: 8.5 g/dL — ABNORMAL LOW (ref 13.0–17.0)
O2 Saturation: 100 %
O2 Saturation: 100 %
O2 Saturation: 100 %
O2 Saturation: 99 %
O2 Saturation: 96 %
Patient temperature: 35.6
Potassium: 4 mmol/L (ref 3.5–5.1)
Potassium: 3.5 mmol/L (ref 3.5–5.1)
Potassium: 4 mmol/L (ref 3.5–5.1)
Potassium: 3.7 mmol/L (ref 3.5–5.1)
Potassium: 3.7 mmol/L (ref 3.5–5.1)
Sodium: 131 mmol/L — ABNORMAL LOW (ref 135–145)
Sodium: 134 mmol/L — ABNORMAL LOW (ref 135–145)
Sodium: 133 mmol/L — ABNORMAL LOW (ref 135–145)
Sodium: 133 mmol/L — ABNORMAL LOW (ref 135–145)
Sodium: 136 mmol/L (ref 135–145)
TCO2: 35 mmol/L — ABNORMAL HIGH (ref 22–32)
TCO2: 30 mmol/L (ref 22–32)
TCO2: 31 mmol/L (ref 22–32)
TCO2: 28 mmol/L (ref 22–32)
TCO2: 26 mmol/L (ref 22–32)
pCO2 arterial: 49 mmHg — ABNORMAL HIGH (ref 32.0–48.0)
pCO2 arterial: 37.1 mmHg (ref 32.0–48.0)
pCO2 arterial: 37.6 mmHg (ref 32.0–48.0)
pCO2 arterial: 38.3 mmHg (ref 32.0–48.0)
pCO2 arterial: 31 mmHg — ABNORMAL LOW (ref 32.0–48.0)
pH, Arterial: 7.447 (ref 7.350–7.450)
pH, Arterial: 7.495 — ABNORMAL HIGH (ref 7.350–7.450)
pH, Arterial: 7.515 — ABNORMAL HIGH (ref 7.350–7.450)
pH, Arterial: 7.449 (ref 7.350–7.450)
pH, Arterial: 7.506 — ABNORMAL HIGH (ref 7.350–7.450)
pO2, Arterial: 482 mmHg — ABNORMAL HIGH (ref 83.0–108.0)
pO2, Arterial: 372 mmHg — ABNORMAL HIGH (ref 83.0–108.0)
pO2, Arterial: 312 mmHg — ABNORMAL HIGH (ref 83.0–108.0)
pO2, Arterial: 112 mmHg — ABNORMAL HIGH (ref 83.0–108.0)
pO2, Arterial: 65 mmHg — ABNORMAL LOW (ref 83.0–108.0)

## 2020-03-27 LAB — CBC
HCT: 29.9 % — ABNORMAL LOW (ref 39.0–52.0)
HCT: 24.8 % — ABNORMAL LOW (ref 39.0–52.0)
HCT: 26.1 % — ABNORMAL LOW (ref 39.0–52.0)
Hemoglobin: 9.4 g/dL — ABNORMAL LOW (ref 13.0–17.0)
Hemoglobin: 8.2 g/dL — ABNORMAL LOW (ref 13.0–17.0)
Hemoglobin: 8.5 g/dL — ABNORMAL LOW (ref 13.0–17.0)
MCH: 28.1 pg (ref 26.0–34.0)
MCH: 29 pg (ref 26.0–34.0)
MCH: 28.4 pg (ref 26.0–34.0)
MCHC: 31.4 g/dL (ref 30.0–36.0)
MCHC: 33.1 g/dL (ref 30.0–36.0)
MCHC: 32.6 g/dL (ref 30.0–36.0)
MCV: 89.3 fL (ref 80.0–100.0)
MCV: 87.6 fL (ref 80.0–100.0)
MCV: 87.3 fL (ref 80.0–100.0)
Platelets: 328 10*3/uL (ref 150–400)
Platelets: 187 10*3/uL (ref 150–400)
Platelets: 211 10*3/uL (ref 150–400)
RBC: 3.35 MIL/uL — ABNORMAL LOW (ref 4.22–5.81)
RBC: 2.83 MIL/uL — ABNORMAL LOW (ref 4.22–5.81)
RBC: 2.99 MIL/uL — ABNORMAL LOW (ref 4.22–5.81)
RDW: 14.5 % (ref 11.5–15.5)
RDW: 14.4 % (ref 11.5–15.5)
RDW: 14.3 % (ref 11.5–15.5)
WBC: 6.7 10*3/uL (ref 4.0–10.5)
WBC: 11.1 10*3/uL — ABNORMAL HIGH (ref 4.0–10.5)
WBC: 11.3 10*3/uL — ABNORMAL HIGH (ref 4.0–10.5)
nRBC: 0 % (ref 0.0–0.2)
nRBC: 0 % (ref 0.0–0.2)
nRBC: 0 % (ref 0.0–0.2)

## 2020-03-27 LAB — POCT I-STAT, CHEM 8
BUN: 26 mg/dL — ABNORMAL HIGH (ref 8–23)
BUN: 25 mg/dL — ABNORMAL HIGH (ref 8–23)
BUN: 25 mg/dL — ABNORMAL HIGH (ref 8–23)
BUN: 24 mg/dL — ABNORMAL HIGH (ref 8–23)
BUN: 23 mg/dL (ref 8–23)
BUN: 24 mg/dL — ABNORMAL HIGH (ref 8–23)
Calcium, Ion: 1.15 mmol/L (ref 1.15–1.40)
Calcium, Ion: 0.95 mmol/L — ABNORMAL LOW (ref 1.15–1.40)
Calcium, Ion: 1.18 mmol/L (ref 1.15–1.40)
Calcium, Ion: 1.18 mmol/L (ref 1.15–1.40)
Calcium, Ion: 1.07 mmol/L — ABNORMAL LOW (ref 1.15–1.40)
Calcium, Ion: 1.4 mmol/L (ref 1.15–1.40)
Chloride: 91 mmol/L — ABNORMAL LOW (ref 98–111)
Chloride: 92 mmol/L — ABNORMAL LOW (ref 98–111)
Chloride: 92 mmol/L — ABNORMAL LOW (ref 98–111)
Chloride: 92 mmol/L — ABNORMAL LOW (ref 98–111)
Chloride: 92 mmol/L — ABNORMAL LOW (ref 98–111)
Chloride: 94 mmol/L — ABNORMAL LOW (ref 98–111)
Creatinine, Ser: 1.6 mg/dL — ABNORMAL HIGH (ref 0.61–1.24)
Creatinine, Ser: 1.5 mg/dL — ABNORMAL HIGH (ref 0.61–1.24)
Creatinine, Ser: 1.6 mg/dL — ABNORMAL HIGH (ref 0.61–1.24)
Creatinine, Ser: 1.3 mg/dL — ABNORMAL HIGH (ref 0.61–1.24)
Creatinine, Ser: 1.3 mg/dL — ABNORMAL HIGH (ref 0.61–1.24)
Creatinine, Ser: 1.4 mg/dL — ABNORMAL HIGH (ref 0.61–1.24)
Glucose, Bld: 107 mg/dL — ABNORMAL HIGH (ref 70–99)
Glucose, Bld: 127 mg/dL — ABNORMAL HIGH (ref 70–99)
Glucose, Bld: 134 mg/dL — ABNORMAL HIGH (ref 70–99)
Glucose, Bld: 124 mg/dL — ABNORMAL HIGH (ref 70–99)
Glucose, Bld: 127 mg/dL — ABNORMAL HIGH (ref 70–99)
Glucose, Bld: 134 mg/dL — ABNORMAL HIGH (ref 70–99)
HCT: 30 % — ABNORMAL LOW (ref 39.0–52.0)
HCT: 25 % — ABNORMAL LOW (ref 39.0–52.0)
HCT: 27 % — ABNORMAL LOW (ref 39.0–52.0)
HCT: 24 % — ABNORMAL LOW (ref 39.0–52.0)
HCT: 25 % — ABNORMAL LOW (ref 39.0–52.0)
HCT: 28 % — ABNORMAL LOW (ref 39.0–52.0)
Hemoglobin: 10.2 g/dL — ABNORMAL LOW (ref 13.0–17.0)
Hemoglobin: 8.5 g/dL — ABNORMAL LOW (ref 13.0–17.0)
Hemoglobin: 9.2 g/dL — ABNORMAL LOW (ref 13.0–17.0)
Hemoglobin: 8.2 g/dL — ABNORMAL LOW (ref 13.0–17.0)
Hemoglobin: 8.5 g/dL — ABNORMAL LOW (ref 13.0–17.0)
Hemoglobin: 9.5 g/dL — ABNORMAL LOW (ref 13.0–17.0)
Potassium: 4.1 mmol/L (ref 3.5–5.1)
Potassium: 3.3 mmol/L — ABNORMAL LOW (ref 3.5–5.1)
Potassium: 3.6 mmol/L (ref 3.5–5.1)
Potassium: 4.1 mmol/L (ref 3.5–5.1)
Potassium: 3.7 mmol/L (ref 3.5–5.1)
Potassium: 4.5 mmol/L (ref 3.5–5.1)
Sodium: 131 mmol/L — ABNORMAL LOW (ref 135–145)
Sodium: 130 mmol/L — ABNORMAL LOW (ref 135–145)
Sodium: 132 mmol/L — ABNORMAL LOW (ref 135–145)
Sodium: 131 mmol/L — ABNORMAL LOW (ref 135–145)
Sodium: 131 mmol/L — ABNORMAL LOW (ref 135–145)
Sodium: 132 mmol/L — ABNORMAL LOW (ref 135–145)
TCO2: 32 mmol/L (ref 22–32)
TCO2: 30 mmol/L (ref 22–32)
TCO2: 31 mmol/L (ref 22–32)
TCO2: 30 mmol/L (ref 22–32)
TCO2: 27 mmol/L (ref 22–32)
TCO2: 30 mmol/L (ref 22–32)

## 2020-03-27 LAB — GLUCOSE, CAPILLARY
Glucose-Capillary: 127 mg/dL — ABNORMAL HIGH (ref 70–99)
Glucose-Capillary: 119 mg/dL — ABNORMAL HIGH (ref 70–99)
Glucose-Capillary: 127 mg/dL — ABNORMAL HIGH (ref 70–99)
Glucose-Capillary: 135 mg/dL — ABNORMAL HIGH (ref 70–99)
Glucose-Capillary: 173 mg/dL — ABNORMAL HIGH (ref 70–99)

## 2020-03-27 LAB — BASIC METABOLIC PANEL
Anion gap: 9 (ref 5–15)
Anion gap: 9 (ref 5–15)
BUN: 27 mg/dL — ABNORMAL HIGH (ref 8–23)
BUN: 21 mg/dL (ref 8–23)
CO2: 32 mmol/L (ref 22–32)
CO2: 25 mmol/L (ref 22–32)
Calcium: 9.1 mg/dL (ref 8.9–10.3)
Calcium: 8.7 mg/dL — ABNORMAL LOW (ref 8.9–10.3)
Chloride: 91 mmol/L — ABNORMAL LOW (ref 98–111)
Chloride: 99 mmol/L (ref 98–111)
Creatinine, Ser: 1.63 mg/dL — ABNORMAL HIGH (ref 0.61–1.24)
Creatinine, Ser: 1.37 mg/dL — ABNORMAL HIGH (ref 0.61–1.24)
GFR calc Af Amer: 48 mL/min — ABNORMAL LOW (ref >60)
GFR calc Af Amer: 59 mL/min — ABNORMAL LOW (ref >60)
GFR calc non Af Amer: 41 mL/min — ABNORMAL LOW (ref >60)
GFR calc non Af Amer: 51 mL/min — ABNORMAL LOW (ref >60)
Glucose, Bld: 103 mg/dL — ABNORMAL HIGH (ref 70–99)
Glucose, Bld: 146 mg/dL — ABNORMAL HIGH (ref 70–99)
Potassium: 4.4 mmol/L (ref 3.5–5.1)
Potassium: 4.6 mmol/L (ref 3.5–5.1)
Sodium: 132 mmol/L — ABNORMAL LOW (ref 135–145)
Sodium: 133 mmol/L — ABNORMAL LOW (ref 135–145)

## 2020-03-27 LAB — PREPARE RBC (CROSSMATCH)

## 2020-03-27 LAB — HEMOGLOBIN AND HEMATOCRIT, BLOOD
HCT: 21.9 % — ABNORMAL LOW (ref 39.0–52.0)
Hemoglobin: 7.1 g/dL — ABNORMAL LOW (ref 13.0–17.0)

## 2020-03-27 LAB — APTT
aPTT: 89 seconds — ABNORMAL HIGH (ref 24–36)
aPTT: 31 seconds (ref 24–36)

## 2020-03-27 LAB — MAGNESIUM: Magnesium: 2.7 mg/dL — ABNORMAL HIGH (ref 1.7–2.4)

## 2020-03-27 LAB — PROTIME-INR
INR: 1.5 — ABNORMAL HIGH (ref 0.8–1.2)
Prothrombin Time: 17.1 seconds — ABNORMAL HIGH (ref 11.4–15.2)

## 2020-03-27 LAB — PLATELET COUNT: Platelets: 193 10*3/uL (ref 150–400)

## 2020-03-27 SURGERY — CORONARY ARTERY BYPASS GRAFTING (CABG)
Anesthesia: General | Site: Chest

## 2020-03-27 MED ORDER — BISACODYL 5 MG PO TBEC
10.0000 mg | DELAYED_RELEASE_TABLET | Freq: Every day | ORAL | Status: DC
  Administered 2020-03-30 – 2020-04-03 (×4): 10 mg via ORAL
  Filled 2020-03-27 (×8): qty 2

## 2020-03-27 MED ORDER — METOCLOPRAMIDE HCL 5 MG/ML IJ SOLN
10.0000 mg | Freq: Four times a day (QID) | INTRAMUSCULAR | Status: DC
  Administered 2020-03-27 – 2020-04-02 (×23): 10 mg via INTRAVENOUS
  Filled 2020-03-27 (×23): qty 2

## 2020-03-27 MED ORDER — MIDAZOLAM HCL 2 MG/2ML IJ SOLN: 2.0000 mg | INTRAMUSCULAR | Status: DC | PRN

## 2020-03-27 MED ORDER — CHLORHEXIDINE GLUCONATE 0.12 % MT SOLN
15.0000 mL | OROMUCOSAL | Status: AC
  Administered 2020-03-27: 15 mL via OROMUCOSAL

## 2020-03-27 MED ORDER — PLASMA-LYTE 148 IV SOLN
INTRAVENOUS | Status: DC | PRN
  Administered 2020-03-27: 500 mL via INTRAVASCULAR

## 2020-03-27 MED ORDER — PROTAMINE SULFATE 10 MG/ML IV SOLN
INTRAVENOUS | Status: DC | PRN
Start: 2020-03-27 — End: 2020-03-27
  Administered 2020-03-27: 50 mg via INTRAVENOUS
  Administered 2020-03-27: 250 mg via INTRAVENOUS

## 2020-03-27 MED ORDER — POTASSIUM CHLORIDE 10 MEQ/50ML IV SOLN
10.0000 meq | INTRAVENOUS | Status: AC
  Administered 2020-03-27 (×3): 10 meq via INTRAVENOUS

## 2020-03-27 MED ORDER — ASPIRIN EC 325 MG PO TBEC: 325.0000 mg | DELAYED_RELEASE_TABLET | Freq: Every day | ORAL | Status: DC

## 2020-03-27 MED ORDER — ACETAMINOPHEN 650 MG RE SUPP
650.0000 mg | Freq: Once | RECTAL | Status: AC
  Administered 2020-03-27: 650 mg via RECTAL

## 2020-03-27 MED ORDER — DOCUSATE SODIUM 100 MG PO CAPS: 200.0000 mg | ORAL_CAPSULE | Freq: Every day | ORAL | Status: DC

## 2020-03-27 MED ORDER — PANTOPRAZOLE SODIUM 40 MG PO TBEC
40.0000 mg | DELAYED_RELEASE_TABLET | Freq: Every day | ORAL | Status: DC
  Administered 2020-03-30 – 2020-03-31 (×2): 40 mg via ORAL
  Filled 2020-03-27 (×4): qty 1

## 2020-03-27 MED ORDER — BISACODYL 10 MG RE SUPP
10.0000 mg | Freq: Every day | RECTAL | Status: DC
  Administered 2020-03-28: 10 mg via RECTAL
  Filled 2020-03-27: qty 1

## 2020-03-27 MED ORDER — ROCURONIUM BROMIDE 10 MG/ML (PF) SYRINGE
PREFILLED_SYRINGE | INTRAVENOUS | Status: AC
  Filled 2020-03-27: qty 20

## 2020-03-27 MED ORDER — LACTATED RINGERS IV SOLN: 500.0000 mL | Freq: Once | INTRAVENOUS | Status: DC | PRN

## 2020-03-27 MED ORDER — SODIUM CHLORIDE (PF) 0.9 % IJ SOLN
OROMUCOSAL | Status: DC | PRN
  Administered 2020-03-27 (×4): 4 mL via TOPICAL

## 2020-03-27 MED ORDER — MIDAZOLAM HCL (PF) 10 MG/2ML IJ SOLN
INTRAMUSCULAR | Status: AC
  Filled 2020-03-27: qty 2

## 2020-03-27 MED ORDER — SODIUM CHLORIDE 0.45 % IV SOLN: INTRAVENOUS | Status: DC | PRN

## 2020-03-27 MED ORDER — METOPROLOL TARTRATE 25 MG/10 ML ORAL SUSPENSION: 12.5000 mg | Freq: Two times a day (BID) | ORAL | Status: DC

## 2020-03-27 MED ORDER — LACTATED RINGERS IV SOLN: INTRAVENOUS | Status: DC | PRN

## 2020-03-27 MED ORDER — PHENYLEPHRINE 40 MCG/ML (10ML) SYRINGE FOR IV PUSH (FOR BLOOD PRESSURE SUPPORT)
PREFILLED_SYRINGE | INTRAVENOUS | Status: AC
  Filled 2020-03-27: qty 10

## 2020-03-27 MED ORDER — DEXMEDETOMIDINE HCL IN NACL 400 MCG/100ML IV SOLN
0.0000 ug/kg/h | INTRAVENOUS | Status: DC
  Administered 2020-03-27: 0.7 ug/kg/h via INTRAVENOUS
  Filled 2020-03-27: qty 100

## 2020-03-27 MED ORDER — LACTATED RINGERS IV SOLN: INTRAVENOUS | Status: DC

## 2020-03-27 MED ORDER — CHLORHEXIDINE GLUCONATE CLOTH 2 % EX PADS
6.0000 | MEDICATED_PAD | Freq: Every day | CUTANEOUS | Status: DC
  Administered 2020-03-27 – 2020-04-09 (×14): 6 via TOPICAL

## 2020-03-27 MED ORDER — EPHEDRINE 5 MG/ML INJ
INTRAVENOUS | Status: AC
  Filled 2020-03-27: qty 10

## 2020-03-27 MED ORDER — MAGNESIUM SULFATE 4 GM/100ML IV SOLN
4.0000 g | Freq: Once | INTRAVENOUS | Status: AC
  Administered 2020-03-27: 4 g via INTRAVENOUS
  Filled 2020-03-27: qty 100

## 2020-03-27 MED ORDER — SUCCINYLCHOLINE CHLORIDE 200 MG/10ML IV SOSY
PREFILLED_SYRINGE | INTRAVENOUS | Status: AC
  Filled 2020-03-27: qty 10

## 2020-03-27 MED ORDER — FENTANYL CITRATE (PF) 100 MCG/2ML IJ SOLN: 50.0000 ug | INTRAMUSCULAR | Status: DC | PRN

## 2020-03-27 MED ORDER — SODIUM CHLORIDE 0.9 % IV SOLN: INTRAVENOUS | Status: DC

## 2020-03-27 MED ORDER — ASPIRIN 81 MG PO CHEW
324.0000 mg | CHEWABLE_TABLET | Freq: Every day | ORAL | Status: DC
  Filled 2020-03-27: qty 4

## 2020-03-27 MED ORDER — METOPROLOL TARTRATE 5 MG/5ML IV SOLN: 2.5000 mg | INTRAVENOUS | Status: DC | PRN

## 2020-03-27 MED ORDER — ROCURONIUM BROMIDE 10 MG/ML (PF) SYRINGE
PREFILLED_SYRINGE | INTRAVENOUS | Status: DC | PRN
  Administered 2020-03-27 (×3): 100 mg via INTRAVENOUS

## 2020-03-27 MED ORDER — HEMOSTATIC AGENTS (NO CHARGE) OPTIME
TOPICAL | Status: DC | PRN
  Administered 2020-03-27 (×2): 1 via TOPICAL

## 2020-03-27 MED ORDER — INSULIN REGULAR(HUMAN) IN NACL 100-0.9 UT/100ML-% IV SOLN: INTRAVENOUS | Status: DC

## 2020-03-27 MED ORDER — SODIUM CHLORIDE 0.9% FLUSH
3.0000 mL | Freq: Two times a day (BID) | INTRAVENOUS | Status: DC
  Administered 2020-03-28 – 2020-04-09 (×23): 3 mL via INTRAVENOUS

## 2020-03-27 MED ORDER — SODIUM CHLORIDE 0.9 % IV SOLN
INTRAVENOUS | Status: DC | PRN
  Administered 2020-03-27: 20 ug via INTRAVENOUS

## 2020-03-27 MED ORDER — MIDAZOLAM HCL 5 MG/5ML IJ SOLN
INTRAMUSCULAR | Status: DC | PRN
  Administered 2020-03-27 (×2): 4 mg via INTRAVENOUS
  Administered 2020-03-27 (×2): 1 mg via INTRAVENOUS

## 2020-03-27 MED ORDER — FAMOTIDINE IN NACL 20-0.9 MG/50ML-% IV SOLN
20.0000 mg | Freq: Two times a day (BID) | INTRAVENOUS | Status: AC
  Administered 2020-03-27 (×2): 20 mg via INTRAVENOUS
  Filled 2020-03-27: qty 50

## 2020-03-27 MED ORDER — PROPOFOL 10 MG/ML IV BOLUS
INTRAVENOUS | Status: DC | PRN
Start: 2020-03-27 — End: 2020-03-27
  Administered 2020-03-27: 30 mg via INTRAVENOUS

## 2020-03-27 MED ORDER — DEXTROSE 50 % IV SOLN: 0.0000 mL | INTRAVENOUS | Status: DC | PRN

## 2020-03-27 MED ORDER — METOPROLOL TARTRATE 12.5 MG HALF TABLET: 12.5000 mg | ORAL_TABLET | Freq: Two times a day (BID) | ORAL | Status: DC

## 2020-03-27 MED ORDER — CHLORHEXIDINE GLUCONATE 0.12% ORAL RINSE (MEDLINE KIT)
15.0000 mL | Freq: Two times a day (BID) | OROMUCOSAL | Status: DC
  Administered 2020-03-27 – 2020-04-07 (×22): 15 mL via OROMUCOSAL

## 2020-03-27 MED ORDER — MILRINONE LACTATE IN DEXTROSE 20-5 MG/100ML-% IV SOLN
0.2500 ug/kg/min | INTRAVENOUS | Status: DC
  Administered 2020-03-28: 0.375 ug/kg/min via INTRAVENOUS
  Administered 2020-03-29: 0.25 ug/kg/min via INTRAVENOUS
  Filled 2020-03-27 (×6): qty 100

## 2020-03-27 MED ORDER — ACETAMINOPHEN 160 MG/5ML PO SOLN: 650.0000 mg | Freq: Once | ORAL | Status: AC

## 2020-03-27 MED ORDER — VANCOMYCIN HCL IN DEXTROSE 1-5 GM/200ML-% IV SOLN
1000.0000 mg | Freq: Once | INTRAVENOUS | Status: AC
  Administered 2020-03-27: 1000 mg via INTRAVENOUS
  Filled 2020-03-27: qty 200

## 2020-03-27 MED ORDER — ALBUMIN HUMAN 5 % IV SOLN: 250.0000 mL | INTRAVENOUS | Status: AC | PRN

## 2020-03-27 MED ORDER — OXYCODONE HCL 5 MG PO TABS
5.0000 mg | ORAL_TABLET | ORAL | Status: DC | PRN
  Administered 2020-03-28 – 2020-04-09 (×14): 5 mg via ORAL
  Filled 2020-03-27 (×13): qty 1

## 2020-03-27 MED ORDER — PHENYLEPHRINE HCL-NACL 20-0.9 MG/250ML-% IV SOLN
0.0000 ug/min | INTRAVENOUS | Status: DC
  Administered 2020-03-27: 10 ug/min via INTRAVENOUS
  Filled 2020-03-27: qty 250

## 2020-03-27 MED ORDER — SODIUM CHLORIDE 0.9% FLUSH
3.0000 mL | INTRAVENOUS | Status: DC | PRN
  Administered 2020-04-02: 3 mL via INTRAVENOUS

## 2020-03-27 MED ORDER — NITROGLYCERIN IN D5W 200-5 MCG/ML-% IV SOLN: 0.0000 ug/min | INTRAVENOUS | Status: DC

## 2020-03-27 MED ORDER — ARTIFICIAL TEARS OPHTHALMIC OINT
TOPICAL_OINTMENT | OPHTHALMIC | Status: AC
  Filled 2020-03-27: qty 3.5

## 2020-03-27 MED ORDER — HEPARIN SODIUM (PORCINE) 1000 UNIT/ML IJ SOLN
INTRAMUSCULAR | Status: DC | PRN
Start: 2020-03-27 — End: 2020-03-27
  Administered 2020-03-27: 2000 [IU] via INTRAVENOUS
  Administered 2020-03-27: 25000 [IU] via INTRAVENOUS

## 2020-03-27 MED ORDER — ORAL CARE MOUTH RINSE
15.0000 mL | OROMUCOSAL | Status: DC
  Administered 2020-03-27 – 2020-04-08 (×90): 15 mL via OROMUCOSAL

## 2020-03-27 MED ORDER — FENTANYL CITRATE (PF) 250 MCG/5ML IJ SOLN
INTRAMUSCULAR | Status: AC
  Filled 2020-03-27: qty 30

## 2020-03-27 MED ORDER — SODIUM CHLORIDE 0.9 % IV SOLN
250.0000 mL | INTRAVENOUS | Status: DC
  Administered 2020-03-28: 250 mL via INTRAVENOUS

## 2020-03-27 MED ORDER — ROCURONIUM BROMIDE 10 MG/ML (PF) SYRINGE
PREFILLED_SYRINGE | INTRAVENOUS | Status: AC
  Filled 2020-03-27: qty 10

## 2020-03-27 MED ORDER — 0.9 % SODIUM CHLORIDE (POUR BTL) OPTIME
TOPICAL | Status: DC | PRN
  Administered 2020-03-27: 5000 mL

## 2020-03-27 MED ORDER — ONDANSETRON HCL 4 MG/2ML IJ SOLN
4.0000 mg | Freq: Four times a day (QID) | INTRAMUSCULAR | Status: DC | PRN
  Administered 2020-04-07: 4 mg via INTRAVENOUS
  Filled 2020-03-27: qty 2

## 2020-03-27 MED ORDER — FENTANYL CITRATE (PF) 100 MCG/2ML IJ SOLN
INTRAMUSCULAR | Status: DC | PRN
  Administered 2020-03-27: 750 ug via INTRAVENOUS
  Administered 2020-03-27: 100 ug via INTRAVENOUS
  Administered 2020-03-27: 50 ug via INTRAVENOUS
  Administered 2020-03-27: 100 ug via INTRAVENOUS
  Administered 2020-03-27: 250 ug via INTRAVENOUS
  Administered 2020-03-27: 50 ug via INTRAVENOUS
  Administered 2020-03-27: 200 ug via INTRAVENOUS

## 2020-03-27 MED ORDER — TRAMADOL HCL 50 MG PO TABS
50.0000 mg | ORAL_TABLET | ORAL | Status: DC | PRN
  Administered 2020-03-27 – 2020-04-07 (×6): 50 mg via ORAL
  Filled 2020-03-27 (×7): qty 1

## 2020-03-27 MED ORDER — ACETAMINOPHEN 160 MG/5ML PO SOLN
1000.0000 mg | Freq: Four times a day (QID) | ORAL | Status: AC
  Administered 2020-03-30 – 2020-04-01 (×5): 1000 mg
  Filled 2020-03-27 (×5): qty 40.6

## 2020-03-27 MED ORDER — ACETAMINOPHEN 500 MG PO TABS
1000.0000 mg | ORAL_TABLET | Freq: Four times a day (QID) | ORAL | Status: AC
  Administered 2020-03-27 – 2020-03-28 (×2): 1000 mg via ORAL
  Filled 2020-03-27 (×3): qty 2

## 2020-03-27 MED ORDER — PROPOFOL 10 MG/ML IV BOLUS
INTRAVENOUS | Status: AC
  Filled 2020-03-27: qty 20

## 2020-03-27 MED ORDER — SODIUM CHLORIDE 0.9 % IV SOLN
1.5000 g | Freq: Two times a day (BID) | INTRAVENOUS | Status: AC
  Administered 2020-03-27 – 2020-03-29 (×4): 1.5 g via INTRAVENOUS
  Filled 2020-03-27 (×4): qty 1.5

## 2020-03-27 MED ORDER — ALBUMIN HUMAN 5 % IV SOLN: INTRAVENOUS | Status: DC | PRN

## 2020-03-27 SURGICAL SUPPLY — 97 items
ADAPTER CARDIO PERF ANTE/RETRO (ADAPTER) ×4 IMPLANT
BAG DECANTER FOR FLEXI CONT (MISCELLANEOUS) ×4 IMPLANT
BLADE CLIPPER SURG (BLADE) IMPLANT
BLADE STERNUM SYSTEM 6 (BLADE) ×4 IMPLANT
BLADE SURG 11 STRL SS (BLADE) ×4 IMPLANT
BLADE SURG 12 STRL SS (BLADE) ×4 IMPLANT
BNDG ELASTIC 4X5.8 VLCR STR LF (GAUZE/BANDAGES/DRESSINGS) ×4 IMPLANT
BNDG ELASTIC 6X10 VLCR STRL LF (GAUZE/BANDAGES/DRESSINGS) ×4 IMPLANT
BNDG ELASTIC 6X5.8 VLCR STR LF (GAUZE/BANDAGES/DRESSINGS) ×4 IMPLANT
BNDG GAUZE ELAST 4 BULKY (GAUZE/BANDAGES/DRESSINGS) ×4 IMPLANT
CANISTER SUCT 3000ML PPV (MISCELLANEOUS) ×4 IMPLANT
CANNULA GUNDRY RCSP 15FR (MISCELLANEOUS) ×4 IMPLANT
CATH CPB KIT VANTRIGT (MISCELLANEOUS) ×4 IMPLANT
CATH ROBINSON RED A/P 18FR (CATHETERS) ×12 IMPLANT
CATH THORACIC 28FR RT ANG (CATHETERS) ×4 IMPLANT
CLIP VESOCCLUDE SM WIDE 24/CT (CLIP) ×4 IMPLANT
DERMABOND ADHESIVE PROPEN (GAUZE/BANDAGES/DRESSINGS) ×2
DERMABOND ADVANCED (GAUZE/BANDAGES/DRESSINGS) ×2
DERMABOND ADVANCED .7 DNX12 (GAUZE/BANDAGES/DRESSINGS) ×2 IMPLANT
DERMABOND ADVANCED .7 DNX6 (GAUZE/BANDAGES/DRESSINGS) ×2 IMPLANT
DRAIN CHANNEL 28F RND 3/8 FF (WOUND CARE) ×4 IMPLANT
DRAIN CHANNEL 32F RND 10.7 FF (WOUND CARE) IMPLANT
DRAPE CARDIOVASCULAR INCISE (DRAPES) ×4
DRAPE SLUSH/WARMER DISC (DRAPES) ×4 IMPLANT
DRAPE SRG 135X102X78XABS (DRAPES) ×2 IMPLANT
DRSG AQUACEL AG ADV 3.5X14 (GAUZE/BANDAGES/DRESSINGS) ×4 IMPLANT
ELECT BLADE 4.0 EZ CLEAN MEGAD (MISCELLANEOUS) ×4
ELECT BLADE 6.5 EXT (BLADE) ×4 IMPLANT
ELECT CAUTERY BLADE 6.4 (BLADE) ×4 IMPLANT
ELECT REM PT RETURN 9FT ADLT (ELECTROSURGICAL) ×8
ELECTRODE BLDE 4.0 EZ CLN MEGD (MISCELLANEOUS) ×2 IMPLANT
ELECTRODE REM PT RTRN 9FT ADLT (ELECTROSURGICAL) ×4 IMPLANT
FELT TEFLON 1X6 (MISCELLANEOUS) ×8 IMPLANT
GAUZE SPONGE 4X4 12PLY STRL (GAUZE/BANDAGES/DRESSINGS) ×8 IMPLANT
GAUZE SPONGE 4X4 12PLY STRL LF (GAUZE/BANDAGES/DRESSINGS) ×8 IMPLANT
GLOVE BIO SURGEON STRL SZ 6.5 (GLOVE) ×30 IMPLANT
GLOVE BIO SURGEON STRL SZ7.5 (GLOVE) ×12 IMPLANT
GLOVE BIO SURGEONS STRL SZ 6.5 (GLOVE) ×10
GOWN STRL REUS W/ TWL LRG LVL3 (GOWN DISPOSABLE) ×18 IMPLANT
GOWN STRL REUS W/TWL LRG LVL3 (GOWN DISPOSABLE) ×36
HEMOSTAT POWDER SURGIFOAM 1G (HEMOSTASIS) ×16 IMPLANT
HEMOSTAT SURGICEL 2X14 (HEMOSTASIS) ×4 IMPLANT
INSERT FOGARTY XLG (MISCELLANEOUS) IMPLANT
KIT BASIN OR (CUSTOM PROCEDURE TRAY) ×4 IMPLANT
KIT SUCTION CATH 14FR (SUCTIONS) ×4 IMPLANT
KIT TURNOVER KIT B (KITS) ×4 IMPLANT
KIT VASOVIEW HEMOPRO 2 VH 4000 (KITS) ×4 IMPLANT
LEAD PACING MYOCARDI (MISCELLANEOUS) ×4 IMPLANT
MARKER GRAFT CORONARY BYPASS (MISCELLANEOUS) ×12 IMPLANT
NS IRRIG 1000ML POUR BTL (IV SOLUTION) ×24 IMPLANT
PACK E OPEN HEART (SUTURE) ×4 IMPLANT
PACK OPEN HEART (CUSTOM PROCEDURE TRAY) ×4 IMPLANT
PAD ARMBOARD 7.5X6 YLW CONV (MISCELLANEOUS) ×8 IMPLANT
PAD ELECT DEFIB RADIOL ZOLL (MISCELLANEOUS) ×4 IMPLANT
PENCIL BUTTON HOLSTER BLD 10FT (ELECTRODE) ×8 IMPLANT
POSITIONER HEAD DONUT 9IN (MISCELLANEOUS) ×4 IMPLANT
PUNCH AORTIC ROTATE 4.0MM (MISCELLANEOUS) IMPLANT
PUNCH AORTIC ROTATE 4.5MM 8IN (MISCELLANEOUS) ×4 IMPLANT
PUNCH AORTIC ROTATE 5MM 8IN (MISCELLANEOUS) IMPLANT
SET CARDIOPLEGIA MPS 5001102 (MISCELLANEOUS) ×4 IMPLANT
SPONGE LAP 18X18 RF (DISPOSABLE) ×8 IMPLANT
SUPPORT HEART JANKE-BARRON (MISCELLANEOUS) ×4 IMPLANT
SURGIFLO W/THROMBIN 8M KIT (HEMOSTASIS) ×4 IMPLANT
SUT BONE WAX W31G (SUTURE) ×4 IMPLANT
SUT ETHILON 3 0 FSL (SUTURE) ×4 IMPLANT
SUT MNCRL AB 4-0 PS2 18 (SUTURE) ×4 IMPLANT
SUT PROLENE 3 0 SH DA (SUTURE) IMPLANT
SUT PROLENE 3 0 SH1 36 (SUTURE) IMPLANT
SUT PROLENE 4 0 RB 1 (SUTURE) ×8
SUT PROLENE 4 0 SH DA (SUTURE) ×12 IMPLANT
SUT PROLENE 4-0 RB1 .5 CRCL 36 (SUTURE) ×4 IMPLANT
SUT PROLENE 5 0 C 1 36 (SUTURE) IMPLANT
SUT PROLENE 6 0 C 1 30 (SUTURE) ×12 IMPLANT
SUT PROLENE 6 0 CC (SUTURE) ×12 IMPLANT
SUT PROLENE 8 0 BV175 6 (SUTURE) IMPLANT
SUT PROLENE BLUE 7 0 (SUTURE) ×4 IMPLANT
SUT SILK  1 MH (SUTURE)
SUT SILK 1 MH (SUTURE) IMPLANT
SUT SILK 2 0 SH CR/8 (SUTURE) ×4 IMPLANT
SUT SILK 3 0 SH CR/8 (SUTURE) IMPLANT
SUT STEEL 6MS V (SUTURE) ×4 IMPLANT
SUT STEEL SZ 6 DBL 3X14 BALL (SUTURE) ×8 IMPLANT
SUT VIC AB 1 CTX 36 (SUTURE) ×24
SUT VIC AB 1 CTX36XBRD ANBCTR (SUTURE) ×12 IMPLANT
SUT VIC AB 2-0 CT1 27 (SUTURE) ×4
SUT VIC AB 2-0 CT1 TAPERPNT 27 (SUTURE) ×2 IMPLANT
SUT VIC AB 2-0 CTX 27 (SUTURE) ×4 IMPLANT
SUT VIC AB 3-0 X1 27 (SUTURE) IMPLANT
SYSTEM SAHARA CHEST DRAIN ATS (WOUND CARE) ×4 IMPLANT
TAPE CLOTH SURG 4X10 WHT LF (GAUZE/BANDAGES/DRESSINGS) ×4 IMPLANT
TAPE PAPER 2X10 WHT MICROPORE (GAUZE/BANDAGES/DRESSINGS) ×4 IMPLANT
TOWEL GREEN STERILE (TOWEL DISPOSABLE) ×4 IMPLANT
TOWEL GREEN STERILE FF (TOWEL DISPOSABLE) ×4 IMPLANT
TRAY FOLEY SLVR 16FR TEMP STAT (SET/KITS/TRAYS/PACK) ×4 IMPLANT
TUBING LAP HI FLOW INSUFFLATIO (TUBING) ×4 IMPLANT
UNDERPAD 30X36 HEAVY ABSORB (UNDERPADS AND DIAPERS) ×4 IMPLANT
WATER STERILE IRR 1000ML POUR (IV SOLUTION) ×8 IMPLANT

## 2020-03-27 NOTE — Progress Notes (Signed)
Pt has been transfered to OR. Alert and oriented X4. Skin warm and dry. No distress or discomfort noted at the time of transfer. Reported off to [facility] in [place].

## 2020-03-27 NOTE — Transfer of Care (Signed)
Immediate Anesthesia Transfer of Care Note  Patient: Connor [name]  Procedure(s) Performed: CORONARY ARTERY BYPASS GRAFTING (CABG) x Three, using left internal mammary artery and right leg greater saphenous vein harvested endoscopically (N/A Chest) TRANSESOPHAGEAL ECHOCARDIOGRAM (TEE) (N/A )  Patient Location: SICU  Anesthesia Type:General  Level of Consciousness: drowsy, patient cooperative and Patient remains intubated per anesthesia plan  Airway & Oxygen Therapy: Patient remains intubated per anesthesia plan and Patient placed on Ventilator (see vital sign flow sheet for setting)  Post-op Assessment: Report given to RN and Post -op Vital signs reviewed and stable  Post vital signs: Reviewed and stable  Last Vitals:  Vitals Value Taken Time  BP 121/60 03/27/20 1415  Temp    Pulse 90 03/27/20 1415  Resp 14 03/27/20 1415  SpO2 95 % 03/27/20 1415    Last Pain:  Vitals:   03/27/20 0525  TempSrc: Oral  PainSc:          Complications: No apparent anesthesia complications

## 2020-03-27 NOTE — Op Note (Signed)
NAMEDAVE, MERGEN MEDICAL RECORD [id number] ACCOUNT [id number] DATE OF BIRTH:07-10-1946 FACILITY: [facility] LOCATION: [facility] PHYSICIAN:Shakura Cowing VAN TRIGT III, MD  OPERATIVE REPORT  DATE OF PROCEDURE:  03/27/2020  OPERATION: 1.  Coronary artery bypass grafting x3 (left internal mammary artery to left anterior descending, saphenous vein graft to obtuse marginal 1, saphenous vein graft to posterior descending). 2.  Endoscopic harvest of right leg greater saphenous vein.  SURGEON:  Ivin Poot, MD  ASSISTANT:  Lars Pinks, PA-C.  ANESTHESIA:  General by Dr. Marya Landry.  PREOPERATIVE DIAGNOSES:   1.  Ischemic cardiomyopathy.  2.  Severe 3-vessel coronary disease with left main stenosis.  3.  Ejection fraction 20-25%.   4. Class IV symptoms of congestive heart failure.   POSTOPERATIVE DIAGNOSES:   1.  Ischemic cardiomyopathy.  2.  Severe 3-vessel coronary disease with left main stenosis.  3.  Ejection fraction 20-25%.   4. Class IV symptoms of congestive heart failure.   CLINICAL NOTE:  The patient is a 74 year old male who presented with shortness of breath, pulmonary edema, pleural effusions and poor LV function on echocardiogram.  He was diuresed with improved symptoms and underwent cardiac catheterization.  This  demonstrated a significant left main stenosis with 3-vessel coronary disease.  His cardiologist felt that coronary bypass surgery would be his best long-term therapy for his ischemic cardiomyopathy and symptoms of heart failure.  I agreed with that  recommendation and discussed the procedure of CABG in detail with the patient and his son.  He understood that he would be at increased risk due to his poor LV function and his history of heavy head and neck radiation for head and neck cancer several  years ago which has affected his airway and his circulation in the neck.  He also understood that without this surgery that his expected survival would be very  poor.  He understood that we would use cardiopulmonary bypass and general anesthesia,  sternotomy and leg incision, and he would be in the ICU for several days to recover.  He understood the risks of stroke, bleeding, infection, organ failure, blood transfusion, and death.  He agreed to proceed with surgery under what I felt was informed  consent.  OPERATIVE FINDINGS: 1.  Adequate conduit. 2.  Adequate targets. 3.  No significant myocardial scarring, but LV was dilated and preoperatively with poor function, which improved after revascularization following separation from cardiopulmonary bypass. 4.  Preoperative anemia, for which he required 2 units packed cell transfusion during the procedure.  DESCRIPTION OF PROCEDURE:  The patient was brought from preoperative holding where informed consent was documented and final issues were addressed with the patient.  He was placed supine on the operating table and general anesthesia was induced under  invasive hemodynamic monitoring.  He remained stable.  A transesophageal echo probe was placed by the anesthesia team.  The patient was then positioned and prepped and draped as a sterile field.  A proper time-out was performed.  A sternal incision was  made as the saphenous vein was harvested endoscopically from the right leg.  The left internal mammary artery was harvested as a pedicle graft from its origin at the subclavian vessels.  It was a 1.5 mm vessel with good flow.  The sternal retractor was  placed using the deep blades because of the patient's obese body habitus.  The pericardium was opened.  There was a significant amount of pericardial fat.  Both pleural spaces were opened and pleural effusions from heart  failure were drained from each  side.  After the vein was harvested, heparin was administered and pursestrings were placed in the ascending aorta and right atrium, the patient was cannulated and placed on bypass.  The LAD, posterior descending and  OM1 were found to be adequate targets which  would result in good perfusion of his coronary circulation.  Cardioplegia cannulas were placed both antegrade and retrograde cold blood cardioplegia and the patient was cooled to 32 degrees.  The aortic crossclamp was applied and 1 L of cold blood  cardioplegia was delivered in split doses between the antegrade aortic and retrograde coronary sinus catheters.  There was good cardioplegic arrest and supple temperature dropped less than 14 degrees.  Cardioplegia was delivered every 20 minutes.  The first distal anastomosis was to the posterior descending.  This was a 1.5 mm vessel, proximal 95% stenosis.  Reverse saphenous vein was sewn end-to-side with running 7-0 Prolene with good flow through the graft.  Cardioplegia was redosed.  The second distal anastomosis was to the OM1 branch of the left coronary.  It was a 1.5 mm vessel and had a proximal 95% stenosis.  Reverse saphenous vein was sewn end-to-side with running 7-0 Prolene with good flow through the graft.  Cardioplegia was  redosed.  The third distal anastomosis was the mid to distal third of the LAD.  It had a proximal 95% stenosis.  The left IMA pedicle was brought through an opening in the left lateral pericardium and was brought down onto the LAD and sewn end-to-side with running  8-0 Prolene.  There was good flow through the anastomosis with brief release of the pedicle bulldog on the mammary pedicle.  The pedicle was then reclamped and secured to the epicardium.  Cardioplegia was redosed.  While the crossclamp was still in place, 2 proximal vein anastomoses were performed on the ascending aorta using a 4.5 mm punch and running 6-0 Prolene.  Prior to tying down the final proximal anastomosis, air was vented from the coronaries with a dose  of retrograde warm blood cardioplegia.  The crossclamp was removed.  The heart was cardioverted back to a regular rhythm.  The vein grafts were de-aired and  opened.  Each had good flow.  Hemostasis was documented at the proximal and distal sites.  The patient was rewarmed and reperfused.  Temporary pacing wires were  applied.  The lungs were expanded and the ventilator was resumed.  The patient was started on low-dose inotropic support and then weaned successfully off cardiopulmonary bypass on the first attempt.  Echo showed improved global LV function.  Cardiac  output was 4.5 L per minute and he was atrially paced in a slower sinus rhythm.  Protamine was administered without adverse reaction.  The cannulas were removed.  There was still diffuse coagulopathy after reversal of heparin and the patient was given  FFP with improved coagulation function.  The superior pericardial fat was closed over the aorta and vein grafts.  Bilateral pleural chest tubes were placed, as well as an anterior mediastinal chest tube.  The sternum was closed with interrupted steel  wire.  The patient remained stable.  The pectoralis fascia was closed with a running #1 Vicryl.  The subcutaneous and skin layers were closed using running Vicryl and sterile dressings were applied.  Total cardiopulmonary bypass time was 127 minutes.  VN/NUANCE  D:03/27/2020 T:03/27/2020 JOB:[id number]

## 2020-03-27 NOTE — Discharge Summary (Addendum)
Physician Discharge Summary       [address]       [place] [address]             [phone]    Patient ID: Kiaan Overholser [facility] MRN: [id number] DOB/AGE: 1946/02/22 74 y.o.  Admit date: 03/21/2020 Discharge date: 04/09/2020  Admission Diagnoses: 1. Ischemic cardiomyopathy 2. Acute systolic CHF (congestive heart failure), NYHA class 3 ([place]) 3. Coronary artery disease involving native coronary artery of native heart with unstable angina pectoris [facility])   Discharge Diagnoses:  1. S/p CABG x 3 2. Coronary artery disease 3. History of essential hypertension 4. History of mixed hyperlipidemia 5. Acute systolic congestive heart failure 6. History of seizure disorder ([place]) 7. History of stroke 8. History of chronic osteomyelitis of mandible 9. History of orthostatic hypotension 10. History of GERD without esophagitis 11. History of malnutrition of moderate degree    Consults:  SLP, nutrition  Procedure (s):   NAMEYOSHIMI, SARR MEDICAL RECORD [id number] ACCOUNT [id number] DATE OF BIRTH:04/25/1946 FACILITY: [facility] LOCATION: [facility] PHYSICIAN:Scotland Korver VAN TRIGT III, MD   OPERATIVE REPORT   DATE OF PROCEDURE:  03/27/2020   OPERATION: 1.  Coronary artery bypass grafting x3 (left internal mammary artery to left anterior descending, saphenous vein graft to obtuse marginal 1, saphenous vein graft to posterior descending). 2.  Endoscopic harvest of right leg greater saphenous vein.   SURGEON:  Ivin Poot, MD   ASSISTANT:  Lars Pinks, PA-C.   ANESTHESIA:  General by Dr. Marya Landry.   PREOPERATIVE DIAGNOSES:   1.  Ischemic cardiomyopathy.  2.  Severe 3-vessel coronary disease with left main stenosis.  3.  Ejection fraction 20-25%.   4. Class IV symptoms of congestive heart failure.    POSTOPERATIVE DIAGNOSES:   1.  Ischemic cardiomyopathy.  2.  Severe 3-vessel coronary disease with left main stenosis.  3.  Ejection fraction  20-25%.   4. Class IV symptoms of congestive heart failure.     History of Presenting Illness: Patient recently admitted with fatigue, shortness of breath weakness and chest discomfort.  Symptoms have been going on for about a week.  Chest CT scan to rule out PE showed bilateral pleural effusions right greater than left, no PE.  An echocardiogram showed low EF 25%.  He underwent cardiac catheterization yesterday which demonstrated significant left main and three-vessel stenosis.  Right heart cath demonstrated CVP 7, PA pressure 42/22, cardiac output 6 L/min and the EDP 23 mmHg. Patient has severe three vessel coronary artery disease with ischemic cardiomyopathy and class IV heart failure. Dr. Prescott Gum discussed the need for coronary artery disease. Potential risks, benefits, and complications of the surgery were discussed with the patient and he agreed to proceed with surgery. 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  Dr. Carlis Abbott was consulted from vascular surgery. Dr. Carlis Abbott discussed this case with Dr. Debara Pickett and his heart failure is obviously felt to be ischemic cardiomyopathy with planned CABG tomorrow.  There is no evidence at this time that this is contributing to his heart failure and he has responded to diuresis.  Dr. Carlis Abbott thought he can proceed with CABG as planned and would not plan on any immediate operative intervention for this.  He underwent a CABG x 3 on 03/27/2020.  Brief Hospital Course:   On 03/27/2020 Mr. Loftus underwent a coronary artery  bypass grafting by Dr. Prescott Gum. He tolerated the procedure well and was transferred to the surgical ICU. He was extubated in a timely manner. We continued to wean his pressor support and diurese the patient. We held off on any anticoagulation due to prior bleeding from the mouth. SLp evaluated the  patient and recommended NPO and a cortrak was placed for nutrition. POD 2 we slowly weaned milrinone. He was maintaining normal sinus rhythm.  Physical therapy was consulted for debility. PICC line was ordered and placed. We continued to wean milrinone and he remained in the ICU. POD 4 he had some issues with taking liquid tylenol. He felt as though he was having some reflux symptoms therefore we re-consulted the cortrak team to help adjust placement. His TF had to be turned off due to symptoms. After the cortrak tube was advanced, his symptoms subsided and we increased feedings back to goal. SLP continued serial swallow studies and provided recommendations for PO intake. His swallow study from 6/15 showed Dysphagia 1 (puree);Thin liquid Medication Administration: Via alternative means.  The patient was able to have his cortrak removed and a PEG tube placed.  Today, he is ambulating with limited assistance, he is tolerating room air with good oxygen saturation, his incisions are healing well, and he is ready for discharge home. Nutrition recommendations for PEG feedings listed below.   *The patient stayed an extra night due to another episode of orthostatic hypotension. We added midodrine to his regimen resulting in improved BP and resolution of the orthostatic changes. This will be tapered off anter discharge.     Latest Vital Signs: Blood pressure 119/64, pulse 81, temperature (!) 97.5 F (36.4 C), temperature source Oral, resp. rate 16, height 5\' 10"  (1.778 m), weight 89.3 kg, SpO2 96 %.  Physical Exam:  General appearance: alert, cooperative and no distress Heart: regular rate and rhythm, S1, S2 normal, no murmur, click, rub or gallop Abdomen: slightly distended, nontender Extremities: extremities normal, atraumatic, no cyanosis or edema Wound: clean and dry sternal incision  Discharge Condition: Stable and discharged to home.  Recent laboratory studies:  Lab Results  Component Value Date    WBC 12.2 (H) 04/07/2020   HGB 9.4 (L) 04/07/2020   HCT 30.6 (L) 04/07/2020   MCV 86.4 04/07/2020   PLT 322 04/07/2020   Lab Results  Component Value Date   NA 133 (L) 04/07/2020   K 3.7 04/07/2020   CL 95 (L) 04/07/2020   CO2 28 04/07/2020   CREATININE 1.10 04/07/2020   GLUCOSE 126 (H) 04/07/2020      Diagnostic Studies: CT ABDOMEN WO CONTRAST  Result Date: 04/04/2020 CLINICAL DATA:  Evaluate gastric anatomy prior to potential percutaneous gastrostomy tube placement. EXAM: CT ABDOMEN WITHOUT CONTRAST TECHNIQUE: Multidetector CT imaging of the abdomen was performed following the standard protocol without IV contrast. COMPARISON:  CT abdomen pelvis-04/04/2017 FINDINGS: The lack of intravenous contrast limits the ability to evaluate solid abdominal organs Lower chest: Limited visualization of the lower thorax demonstrates small/trace bilateral effusions, right greater than left, with associated bibasilar consolidative opacities, atelectasis versus infiltrate. Note is made of a small right basilar pleural calcification. Normal heart size. Trace amount of pericardial fluid, likely postoperative in etiology. Extensive calcifications within native coronary arteries. Calcifications of the mitral valve annulus. Hepatobiliary: Normal hepatic contour. Normal appearance of the gallbladder given underdistention. No ascites. Pancreas: Pancreas is largely fatty replaced. Spleen: Normal appearance of the spleen. Adrenals/Urinary Tract: Post left-sided nephrectomy. Hypertrophy of the right kidney with  accentuated fetal lobulation as better demonstrated on contrast-enhanced abdominal CT performed 04/04/2017. No evidence of right-sided nephrolithiasis or urinary obstruction. Normal appearance the bilateral adrenal glands. The urinary bladder was not imaged. Stomach/Bowel: The stomach is fairly well apposed against the ventral wall of the upper abdomen, and the percutaneous window will likely be improved with gastric  insufflation. Enteric tube tip terminates within the horizontal segment of the duodenum. Enteric contrast is seen throughout the colon. No evidence of enteric obstruction. No pneumoperitoneum, pneumatosis or portal venous gas. Vascular/Lymphatic: Atherosclerotic plaque within a normal caliber abdominal aorta. Other: Ill-defined stranding about the midline sternotomy, likely postoperative in etiology. Subcutaneous tracks within the ventral aspect of the upper abdomen likely represent the location of previous mediastinal drains (representative image 24, series 3). Musculoskeletal: No acute or aggressive osseous abnormalities. 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 IMPRESSION: 1. Gastric anatomy amenable to potential percutaneous gastrostomy tube placement as indicated. 2. Small/trace bilateral effusions, right greater than left. 3. Sequela of previous left nephrectomy. 4. Aortic Atherosclerosis (ICD10-I70.0). Electronically Signed   By: Sandi Mariscal M.D.   On: 04/04/2020 04:16   DG Chest 2 View  Result Date: 03/21/2020 CLINICAL DATA:  Pneumonia EXAM: CHEST - 2 VIEW COMPARISON:  Chest CT January 05 2010 FINDINGS: There is airspace opacity in the right base with small right pleural effusion. There is a minimal left pleural effusion with slight left base atelectasis. Heart size and pulmonary vascularity normal. No adenopathy. There is degenerative change in the thoracic spine. IMPRESSION: Small pleural effusion on each side, larger on the right than on the left. Ill-defined airspace opacity in the right base, likely combination of atelectasis and a degree of potential pneumonia. Slight left base atelectasis noted. Cardiac silhouette within normal limits.  No adenopathy. Electronically Signed   By: Lowella Grip III M.D.   On: 03/21/2020 17:30   CT Angio Chest PE W and/or Wo  Contrast  Result Date: 03/21/2020 CLINICAL DATA:  74 year old male with shortness of breath. History of prostate cancer. EXAM: CT ANGIOGRAPHY CHEST WITH CONTRAST TECHNIQUE: Multidetector CT imaging of the chest was performed using the standard protocol during bolus administration of intravenous contrast. Multiplanar CT image reconstructions and MIPs were obtained to evaluate the vascular anatomy. CONTRAST:  138mL OMNIPAQUE IOHEXOL 350 MG/ML SOLN COMPARISON:  Chest radiograph dated 03/21/2020. FINDINGS: Cardiovascular: Top-normal cardiac size. No pericardial effusion. Advanced 3 vessel coronary vascular calcification. There is mild atherosclerotic calcification of the thoracic aorta. Evaluation of the aorta is limited due to non opacification and timing of the contrast. Evaluation of the pulmonary arteries is limited due to suboptimal opacification of the peripheral branches. No large or central pulmonary artery embolus identified. Mediastinum/Nodes: No hilar or mediastinal adenopathy. The esophagus is grossly unremarkable. No mediastinal fluid collection. Lungs/Pleura: Moderate right and small left pleural effusions with associated partial compressive atelectasis of the lower lobes. Pneumonia is not excluded. Clinical correlation is recommended. There is diffuse interstitial and interlobular septal prominence most consistent with edema. There is no pneumothorax. The central airways are patent. Upper Abdomen: Partially visualized air pocket adjacent to the stomach (144/5) is indeterminate but may represent air within a segment of the colon. An extraluminal air is not entirely excluded. Musculoskeletal: Degenerative changes of the spine. Sclerotic changes of the T3 and T4 slightly progressed since the CT of 01/06/2020. no acute osseous pathology. Review of the MIP images  confirms the above findings. IMPRESSION: 1. No CT evidence of central pulmonary artery embolus. 2. Moderate right and small left pleural effusions  with associated partial compressive atelectasis of the lower lobes. Pneumonia is not excluded. Clinical correlation is recommended. 3. Diffuse interstitial and interlobular septal prominence most consistent with edema. 4. Sclerotic changes of the T3 and T4 slightly progressed since the CT of 01/06/2020. 5. Partially visualized indeterminate air pocket adjacent to the stomach. This may represent an intraluminal pocket of air, although pneumoperitoneum is not excluded. 6. Aortic Atherosclerosis (ICD10-I70.0). Electronically Signed   By: Anner Crete M.D.   On: 03/21/2020 19:53   IR GASTROSTOMY TUBE MOD SED  Result Date: 04/07/2020 CLINICAL DATA:  Recent CABG, dysphagia, needs enteral feeding support EXAM: PERC PLACEMENT GASTROSTOMY FLUOROSCOPY TIME:  72 seconds; 25 mGy TECHNIQUE: The procedure, risks, benefits, and alternatives were explained to the patient. Questions regarding the procedure were encouraged and answered. The patient understands and consents to the procedure. Patient was receiving adequate prophylactic antibiotic coverage as an inpatient. Progression of previously administered oral barium into the colon was confirmed fluoroscopically. A 5 French angiographic catheter was placed as orogastric tube. The upper abdomen was prepped with Betadine, draped in usual sterile fashion, and infiltrated locally with 1% lidocaine. Intravenous Fentanyl 56mcg and Versed 1.5mg  were administered as conscious sedation during continuous monitoring of the patient's level of consciousness and physiological / cardiorespiratory status by the radiology RN, with a total moderate sedation time of 10 minutes. 0.5 mg glucagon given IV to facilitate gastric distention. Stomach was insufflated using air through the orogastric tube. An 65 French sheath needle was advanced percutaneously into the gastric lumen under fluoroscopy. Gas could be aspirated and a small contrast injection confirmed intraluminal spread. The sheath was  exchanged over a guidewire for a 9 [place] vascular sheath, through which the snare device was advanced and used to snare a guidewire passed through the orogastric tube. This was withdrawn, and the snare attached to the 20 French pull-through gastrostomy tube, which was advanced antegrade, positioned with the internal bumper securing the anterior gastric wall to the anterior abdominal wall. Small contrast injection confirms appropriate positioning. The external bumper was applied and the catheter was flushed. COMPLICATIONS: COMPLICATIONS none IMPRESSION: 1. Technically successful 20 French pull-through gastrostomy placement under fluoroscopy. Electronically Signed   By: Lucrezia Europe M.D.   On: 04/07/2020 11:12   CARDIAC CATHETERIZATION  Result Date: 03/24/2020  Ost RCA to Prox RCA lesion is 99% stenosed.  Prox RCA lesion is 80% stenosed.  Mid RCA to Dist RCA lesion is 20% stenosed.  Ost LAD to Prox LAD lesion is 80% stenosed.  Ost Cx to Prox Cx lesion is 50% stenosed.  1st Diag lesion is 70% stenosed.  Dist LAD lesion is 30% stenosed.  Ost LM to Ost LAD lesion is 90% stenosed.  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 Elevated filling pressures consistent with continued volume overload. Recommendations: Will consult CT surgery for CABG. Will resume IV Lasix tonight.   DG Chest Port 1 View  Result Date: 04/01/2020 CLINICAL DATA:  Recent pneumothorax. Status post coronary artery bypass grafting EXAM: PORTABLE CHEST 1 VIEW COMPARISON:  March 31, 2020 FINDINGS: Currently, no pneumothorax is evident on either side. There is atelectatic change in the left base. Lungs otherwise are clear. Heart is upper normal in size with pulmonary vascularity normal. Feeding tube tip is below the diaphragm. Central catheter  tip is in the superior vena cava. Patient is status post coronary artery bypass grafting. No adenopathy. No bone  lesions. IMPRESSION: No pneumothorax evident currently. Mild left base atelectasis. Lungs otherwise clear. Stable cardiac silhouette. Tube and catheter positions as described. Electronically Signed   By: Lowella Grip III M.D.   On: 04/01/2020 09:07   DG Chest Port 1 View  Result Date: 03/31/2020 CLINICAL DATA:  Post CABG, dyspnea EXAM: PORTABLE CHEST 1 VIEW COMPARISON:  Chest radiograph from one day prior. FINDINGS: Intact sternotomy wires. Right PICC terminates in middle third of the SVC. Enteric tube terminates in the proximal stomach. Interval removal of left chest tube. Stable cardiomediastinal silhouette with mild cardiomegaly. Small right apical pneumothorax, less than 5%, not definitely changed accounting for differences in projection. Small left apical pneumothorax, less than 5%, not appreciably changed. No pleural effusion. No overt pulmonary edema. Similar mild bibasilar atelectasis. IMPRESSION: 1. Stable small bilateral apical pneumothoraces, both less than 5%. 2. Stable mild cardiomegaly without overt pulmonary edema. 3. Similar mild bibasilar atelectasis. Electronically Signed   By: Ilona Sorrel M.D.   On: 03/31/2020 09:48   DG Chest Port 1 View  Result Date: 03/30/2020 CLINICAL DATA:  CABG EXAM: PORTABLE CHEST 1 VIEW COMPARISON:  03/29/2020 FINDINGS: Bilateral chest tubes remain in place. Small left apical pneumothorax. No pneumothorax on the right. Cardiomegaly. Prior CABG. Bibasilar atelectasis. Suspect small left effusion. IMPRESSION: Bilateral chest tubes.  Small left apical pneumothorax. Left base atelectasis with probable small effusion. Electronically Signed   By: Rolm Baptise M.D.   On: 03/30/2020 08:19   DG Chest Port 1 View  Result Date: 03/29/2020 CLINICAL DATA:  History of CABG. EXAM: PORTABLE CHEST 1 VIEW COMPARISON:  03/28/2020 FINDINGS: Swan-Ganz catheter has been removed. RIGHT IJ sheath remains in place. Bilateral chest tubes and mediastinal drains remain in place. Status  post median sternotomy and CABG. Stable cardiomegaly. There has been some improvement in aeration at the LEFT lung base. Atelectasis or consolidation remain at the LEFT lung base. Stable LEFT apical pleural thickening or pleural effusion. There is no pneumothorax. IMPRESSION: Improved aeration at the LEFT lung base. Electronically Signed   By: Nolon Nations M.D.   On: 03/29/2020 09:10   DG Chest Port 1 View  Result Date: 03/28/2020 CLINICAL DATA:  Dizziness and pneumonia. EXAM: PORTABLE CHEST 1 VIEW COMPARISON:  03/27/2020 FINDINGS: Endotracheal tube has been removed. Nasogastric tube has been removed. RIGHT IJ Swan-Ganz catheter tip overlies the level of the pulmonary outflow tract, stable in appearance. Bilateral chest tubes are unchanged in position. No pneumothorax. Bibasilar opacities persist, LEFT greater than RIGHT, and are stable. IMPRESSION: Interval removal of endotracheal tube and nasogastric tube. Stable bibasilar opacities, LEFT greater than RIGHT. Electronically Signed   By: Nolon Nations M.D.   On: 03/28/2020 08:42   DG Chest Port 1 View  Result Date: 03/27/2020 CLINICAL DATA:  Post CABG EXAM: PORTABLE CHEST 1 VIEW COMPARISON:  03/26/2020 FINDINGS: Endotracheal tube is 4 cm above the carina. Bilateral chest tubes in place. No pneumothorax. NG tube tip is in the proximal stomach just beyond the GE junction. Swan-Ganz catheter in the pulmonary outflow tract. Mild vascular congestion and bibasilar atelectasis. IMPRESSION: Postoperative changes.  Bilateral chest tubes without pneumothorax. Support devices as above. Vascular congestion and bibasilar atelectasis. Electronically Signed   By: Rolm Baptise M.D.   On: 03/27/2020 16:11   DG Chest Port 1 View  Result Date: 03/26/2020 CLINICAL DATA:  Non ST elevation MI EXAM: PORTABLE  CHEST 1 VIEW COMPARISON:  03/21/2020 FINDINGS: Improved interstitial opacity and pleural effusions. Stable borderline heart size accentuated by portable technique. No  pneumothorax IMPRESSION: Improved aeration with diminished pleural effusions. Electronically Signed   By: Monte Fantasia M.D.   On: 03/26/2020 08:44   DG Abd Portable 1V  Result Date: 04/01/2020 CLINICAL DATA:  Feeding tube placement EXAM: PORTABLE ABDOMEN - 1 VIEW COMPARISON:  03/30/2020 FINDINGS: Feeding tube tip at the duodenal jejunal junction. Postoperative changes in the upper abdomen as before. Epicardial pacer wires are visualized. Chest support tubes no longer seen. Bowel gas pattern nonobstructive. Spinal degenerative changes. IMPRESSION: Feeding tube tip at the duodenal jejunal junction. Electronically Signed   By: Zetta Bills M.D.   On: 04/01/2020 11:30   DG Abd Portable 1V  Result Date: 03/30/2020 CLINICAL DATA:  Feeding tube placement EXAM: PORTABLE ABDOMEN - 1 VIEW COMPARISON:  Portable exam 1220 hours without priors for comparison FINDINGS: Tip of feeding tube projects over proximal to mid stomach. Epicardial pacing wires present. Mediastinal drain and LEFT thoracostomy tube present. Nonobstructive bowel gas pattern. Degenerative disc disease changes thoracolumbar spine. Small amount of contrast in stomach and small bowel. IMPRESSION: Tip of feeding tube projects over proximal to mid stomach. Electronically Signed   By: Lavonia Dana M.D.   On: 03/30/2020 12:31   DG Swallowing Func-Speech Pathology  Result Date: 04/09/2020 Objective Swallowing Evaluation: Type of Study: MBS-Modified Barium Swallow Study  Patient Details Name: Shivansh Hardaway MRN: [id number] Date of Birth: [birth year] Today's Date: 04/09/2020 Time: SLP Start Time (ACUTE ONLY): 4098 -SLP Stop Time (ACUTE ONLY): 1310 SLP Time Calculation (min) (ACUTE ONLY): 25 min Past Medical History: Past Medical History: Diagnosis Date  Arthritis   OA AND PAIN RT KNEE  Cancer ([place])   h/o neck - ABOUT 6 YRS AGO - TX'D WITH RADIATION AND CHEMO   Chronic kidney disease   GERD without esophagitis 03/21/2020  Head and neck cancer ~ 2009  S/P  radiation & chem, [facility]  Headache(784.0)   Hyperlipidemia   Hypertension   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 - HE RETIRED AND PT LAST SAW DR. PENUMALLI  Stroke ([place]) DEC 2013  UNABLE TO SPEAK OR MOVE AND RT SIDE WEAKNESS AND LOSS OF SKIN SENSITIVITY TO HEAT AND COLD ON RT SIDE, DOUBLE VISION. BALANCE PROBLEMS---STATES STILL HAS DOUBLE VISION AND BALANCE PROBLEM AND RT SIDED LOSS OF SKIN SENSITIVITY Past Surgical History: Past Surgical History: Procedure Laterality Date  CORONARY ARTERY BYPASS GRAFT N/A 03/27/2020  Procedure: CORONARY ARTERY BYPASS GRAFTING (CABG) x Three, using left internal mammary artery and right leg greater saphenous vein harvested endoscopically;  Surgeon: Ivin Poot, MD;  Location: [place];  Service: Open Heart Surgery;  Laterality: N/A;  hydrocelectomy  11/2000  left  IR FLUORO GUIDE CV LINE RIGHT  08/31/2017  IR GASTROSTOMY TUBE MOD SED  04/07/2020  IR US GUIDE VASC ACCESS RIGHT  08/31/2017  JOINT REPLACEMENT    LEFT TOTAL KNEE ARTHROPLASTY  MOUTH SURGERY  2019  NEPHRECTOMY  1990's  left  PROSTATE BIOPSY  05/20/14  Gleason 4+3=7, vol 25 gm  RIGHT/LEFT HEART CATH AND CORONARY  ANGIOGRAPHY N/A 03/24/2020  Procedure: RIGHT/LEFT HEART CATH AND CORONARY ANGIOGRAPHY;  Surgeon: Burnell Blanks, MD;  Location: [place] [facility];  Service: Cardiovascular;  Laterality: N/A;  TEE WITHOUT CARDIOVERSION  10/04/2011  Procedure: TRANSESOPHAGEAL ECHOCARDIOGRAM (TEE);  Surgeon: Candee Furbish, MD;  Location[facility] ENDOSCOPY;  Service: Cardiovascular;  Laterality: N/A;  TEE WITHOUT CARDIOVERSION N/A 03/27/2020  Procedure: TRANSESOPHAGEAL ECHOCARDIOGRAM (TEE);  Surgeon: Prescott Gum, Collier Salina, MD;  Location: [place];   Service: Open Heart Surgery;  Laterality: N/A;  TOTAL KNEE ARTHROPLASTY  2011  left  TOTAL KNEE ARTHROPLASTY Right 02/24/2014  Procedure: RIGHT TOTAL KNEE ARTHROPLASTY;  Surgeon: Gearlean Alf, MD;  Location: [facility];  Service: Orthopedics;  Laterality: Right; HPI: 74 year old male with past medical history of prostate cancer with metastases to the bone, squamous cell carcinoma of the left tonsil in 2009 (S/P radiation, chemo, surgical resection), multiple previous CVAs (4098, embolic CVA's),  hypertension, hyperlipidemia, osteoarthritis, orthostatic hypotension, seizure disorder, osteonecrosis and osteomyelitis of the left mandible complicated by fracture status post reconstruction and plate 4/13 who presents to [facility] with ischemic cardiomyopathy with acute systolic heart failure now s/p CABG 6/4.  He has a hx of dysphagia with MBS on 10/01/2011.  Most recent recommendations for Dysphagia 3 solids and thin liquids with use of strategies. Pt has a hx of CVA and head/neck cancer.   Subjective: Pt was alert and pleasant Assessment / Plan / Recommendation CHL IP CLINICAL IMPRESSIONS 04/02/2020 Clinical Impression Pt demonstrates ongoing moderate to severe dyspahgia, likely close to baseline finctuion per pt report. He has anatomical and senory changes to his oral musculature, leading to mild anterior spillage and residue in the buccal cavities. Pt does make and effor to clear this, but at time times it spills posteriorally post swallow. Oropharyngeal mechanism is impaired with early sustained glottic closure with laryngeal elevation as a baseline adapted strategy. However, there is limited epiglottic deflection and approximation of aryteniod to epigltotis resulting in penetration to the false folds during swallow. There is limited base of tongue retraction and hyoid burst with piecemeal propulsion of bolus into the restricted UES. Pt uses at least two hyoid bursts to transit a bolus, but when elevation is  released the remaining residue and penetrate is aspirated post swallow with sensation. Pt has a soft reflexive cough, but with encouragement, increased this to a strong "bark" cough that ejected aspirate, followed by a second swallow. Nectar increased residue and penetrate. Puree required a liquid wash to clear (positional strategies not beneficial). Liquid wash is aspirated. Again, suspect this is near pts baseline and that he aspirates significantly PTA. He also reports oral intake is effortful, laborious and fatiguing at times. At this time, recommend pt start drinking plain water with his recommended strategies with ongoing use of Cortrak for nutrition. Discussed plan with Dr Prescott Gum who agrees, but would like to revisit plan after the weekend to determine d/c home diet. May need to consider ongoing use of supplemental nutrition after d/c.  SLP Visit Diagnosis Dysphagia, oropharyngeal phase (R13.12) Attention and concentration deficit following -- Frontal lobe and executive function deficit following -- Impact on safety and function Severe aspiration risk;Risk for inadequate nutrition/hydration   CHL IP TREATMENT RECOMMENDATION 04/02/2020 Treatment Recommendations Therapy as outlined in treatment plan below   Prognosis 04/02/2020 Prognosis for Safe Diet Advancement Good Barriers to Reach Goals -- Barriers/Prognosis Comment -- CHL IP DIET RECOMMENDATION 04/02/2020 SLP Diet Recommendations Thin liquid Liquid Administration via Cup Medication Administration Via alternative means Compensations --  Postural Changes --   CHL IP OTHER RECOMMENDATIONS 04/02/2020 Recommended Consults -- Oral Care Recommendations Oral care QID Other Recommendations Have oral suction available   CHL IP FOLLOW UP RECOMMENDATIONS 04/02/2020 Follow up Recommendations Home health SLP   CHL IP FREQUENCY AND DURATION 04/02/2020 Speech Therapy Frequency (ACUTE ONLY) min 2x/week Treatment Duration 2 weeks      CHL IP ORAL PHASE 04/02/2020 Oral Phase  Impaired Oral - Pudding Teaspoon -- Oral - Pudding Cup -- Oral - Honey Teaspoon NT Oral - Honey Cup -- Oral - Nectar Teaspoon NT Oral - Nectar Cup Reduced posterior propulsion;Left pocketing in lateral sulci;Right pocketing in lateral sulci;Pocketing in anterior sulcus;Left anterior bolus loss Oral - Nectar Straw -- Oral - Thin Teaspoon NT Oral - Thin Cup Reduced posterior propulsion;Left pocketing in lateral sulci;Right pocketing in lateral sulci;Pocketing in anterior sulcus;Left anterior bolus loss Oral - Thin Straw NT Oral - Puree Delayed oral transit;Decreased bolus cohesion;Lingual/palatal residue Oral - Mech Soft -- Oral - Regular -- Oral - Multi-Consistency -- Oral - Pill -- Oral Phase - Comment --  CHL IP PHARYNGEAL PHASE 04/02/2020 Pharyngeal Phase Impaired Pharyngeal- Pudding Teaspoon -- Pharyngeal -- Pharyngeal- Pudding Cup -- Pharyngeal -- Pharyngeal- Honey Teaspoon NT Pharyngeal -- Pharyngeal- Honey Cup -- Pharyngeal -- Pharyngeal- Nectar Teaspoon NT Pharyngeal -- Pharyngeal- Nectar Cup Reduced epiglottic inversion;Reduced airway/laryngeal closure;Reduced tongue base retraction;Reduced anterior laryngeal mobility;Penetration/Aspiration during swallow;Penetration/Apiration after swallow;Moderate aspiration;Pharyngeal residue - valleculae;Pharyngeal residue - pyriform Pharyngeal Material enters airway, passes BELOW cords and not ejected out despite cough attempt by patient Pharyngeal- Nectar Straw -- Pharyngeal -- Pharyngeal- Thin Teaspoon NT Pharyngeal -- Pharyngeal- Thin Cup Reduced epiglottic inversion;Reduced airway/laryngeal closure;Reduced tongue base retraction;Reduced anterior laryngeal mobility;Penetration/Aspiration during swallow;Penetration/Apiration after swallow;Moderate aspiration;Pharyngeal residue - valleculae;Pharyngeal residue - pyriform Pharyngeal Material enters airway, passes BELOW cords and not ejected out despite cough attempt by patient Pharyngeal- Thin Straw NT Pharyngeal --  Pharyngeal- Puree Reduced epiglottic inversion;Reduced airway/laryngeal closure;Reduced tongue base retraction;Reduced anterior laryngeal mobility;Pharyngeal residue - valleculae;Pharyngeal residue - pyriform Pharyngeal -- Pharyngeal- Mechanical Soft -- Pharyngeal -- Pharyngeal- Regular -- Pharyngeal -- Pharyngeal- Multi-consistency -- Pharyngeal -- Pharyngeal- Pill -- Pharyngeal -- Pharyngeal Comment --  CHL IP CERVICAL ESOPHAGEAL PHASE 04/02/2020 Cervical Esophageal Phase Impaired Pudding Teaspoon -- Pudding Cup -- Honey Teaspoon -- Honey Cup -- Nectar Teaspoon -- Nectar Cup -- Nectar Straw -- Thin Teaspoon -- Thin Cup -- Thin Straw -- Puree -- Mechanical Soft -- Regular -- Multi-consistency -- Pill -- Cervical Esophageal Comment -- Lynann Beaver 04/09/2020, 8:09 AM              DG Swallowing Func-Speech Pathology  Result Date: 03/29/2020 Objective Swallowing Evaluation: Type of Study: MBS-Modified Barium Swallow Study  Patient Details Name: Landyn Buckalew MRN: [id number] Date of Birth: 31-Dec-1945 Today's Date: 03/29/2020 Time: SLP Start Time (ACUTE ONLY): 1010 -SLP Stop Time (ACUTE ONLY): 1700 SLP Time Calculation (min) (ACUTE ONLY): 30 min Past Medical History: Past Medical History: Diagnosis Date  Arthritis   OA AND PAIN RT KNEE  Cancer ([place])   h/o neck - ABOUT 6 YRS AGO - TX'D WITH RADIATION AND CHEMO   Chronic kidney disease   GERD without esophagitis 03/21/2020  Head and neck cancer ~ 2009  S/P radiation & chem, [facility]  Headache(784.0)   Hyperlipidemia   Hypertension   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 - HE RETIRED AND PT LAST SAW DR. PENUMALLI  Stroke ([place]) DEC 2013  UNABLE TO SPEAK OR MOVE  AND RT SIDE WEAKNESS AND LOSS OF SKIN SENSITIVITY TO HEAT AND COLD ON RT SIDE, DOUBLE VISION. BALANCE PROBLEMS---STATES STILL HAS DOUBLE VISION AND BALANCE PROBLEM AND RT SIDED LOSS OF SKIN SENSITIVITY Past Surgical History: Past Surgical History: Procedure Laterality Date  hydrocelectomy  11/2000  left  IR FLUORO GUIDE CV LINE RIGHT  08/31/2017  IR US GUIDE VASC ACCESS RIGHT  08/31/2017  JOINT REPLACEMENT    LEFT TOTAL KNEE ARTHROPLASTY  MOUTH SURGERY  2019  NEPHRECTOMY  1990's  left  PROSTATE BIOPSY  05/20/14  Gleason 4+3=7, vol 25 gm  RIGHT/LEFT HEART CATH AND CORONARY ANGIOGRAPHY N/A 03/24/2020  Procedure: RIGHT/LEFT HEART CATH AND CORONARY ANGIOGRAPHY;  Surgeon: Burnell Blanks, MD;  Location: [facility];  Service: Cardiovascular;  Laterality: N/A;  TEE WITHOUT CARDIOVERSION  10/04/2011  Procedure: TRANSESOPHAGEAL ECHOCARDIOGRAM (TEE);  Surgeon: Candee Furbish, MD;  Location: [facility] ENDOSCOPY;  Service: Cardiovascular;  Laterality: N/A;  TOTAL KNEE ARTHROPLASTY  2011  left  TOTAL KNEE ARTHROPLASTY Right 02/24/2014  Procedure: RIGHT TOTAL KNEE ARTHROPLASTY;  Surgeon: Gearlean Alf, MD;  Location: [facility];  Service: Orthopedics;  Laterality: Right; HPI: 74 year old male with past medical history of prostate cancer with metastases to the bone, squamous cell carcinoma of the left tonsil in 2009 (S/P radiation, chemo, surgical resection), 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  He has a hx of dysphagia with MBS on 10/01/2011.  Most recent recommendations for Dysphagia 3 solids and thin liquids with use of strategies. Pt has a hx of CVA and head/neck cancer.   Subjective: Pt was alert and  pleasant Assessment / Plan / Recommendation CHL IP CLINICAL IMPRESSIONS 03/29/2020 Clinical Impression Pt was seen for a modified barium swallow study and he presents with moderately-severe oropharyngeal dysphagia with resultant aspiration of thin liquid and nectar-thick liquid, and deep laryngeal penetration of honey-thick liquid and puree.  Pt demonstrated fairly good airway protection before and during the swallow; however, he had mild-severe pharyngeal residue which resulted in aspiration after the swallow.  Aspiration was sensed and pt was able to intermittently clear trace amounts of aspirated materials with a spontaneous cough.  Of note, pt only consumed tsp of each trial on this examination except for thin liquid via straw (with chin tuck), which resulted in moderate aspiration.  Suspect that laryngeal residue and aspiration would have increased with cup or straw sips of nectar-thick liquid and honey-thick liquid.  Pharyngeal residue was noted to increase as trials increased in viscosity, with severe residue observed with the puree trial.  Oral phase was remarkable for reduced lingual control resulting in premature spillage to the pyriform sinus and reduced lingual strength resulting in oral residue.  Pharyngeal phase was remarkable for reduced BOT retraction resulting in vallecular residue, reduced hyolaryngeal elevation/excursion resulting in vallecular and pyriform residue, and reduced pharyngeal constriction resulting in posterior pharyngeal wall residue.  Suspect reduced UES duration of opening secondary to pharyngeal  weakness, but it may also be attributable to esophageal dysfunction.  Pt is at risk for aspiration with all consistencies secondary to pharyngeal weakness and residue.  Recommend continuation of NPO at this time with short-term alternative means of nutrition and frequent oral care.  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  Consideration of long-term alternative means of nutrition may be warranted in the future if dysphagia does not improve.  SLP will f/u per POC.   SLP Visit Diagnosis Dysphagia, oropharyngeal phase (R13.12) Attention and concentration deficit following -- Frontal lobe and executive function deficit following -- Impact on safety and function Severe aspiration risk   CHL IP TREATMENT RECOMMENDATION 03/29/2020 Treatment Recommendations Therapy as outlined in treatment plan below   Prognosis 03/29/2020 Prognosis for Safe Diet Advancement Guarded Barriers to Reach Goals Severity of deficits Barriers/Prognosis Comment -- CHL IP DIET RECOMMENDATION 03/29/2020 SLP Diet Recommendations NPO;Alternative means - temporary;Ice chips PRN after oral care Liquid Administration via -- Medication Administration Via alternative means Compensations -- Postural Changes --   CHL IP OTHER RECOMMENDATIONS 03/29/2020 Recommended Consults -- Oral Care Recommendations Oral care QID;Staff/trained caregiver to provide oral care Other Recommendations Remove water pitcher   CHL IP FOLLOW UP RECOMMENDATIONS 03/29/2020 Follow up Recommendations Skilled Nursing facility   [facility] IP FREQUENCY AND DURATION 03/29/2020 Speech Therapy Frequency (ACUTE ONLY) min 2x/week Treatment Duration 2 weeks      CHL IP ORAL PHASE 03/29/2020 Oral Phase Impaired Oral - Pudding Teaspoon -- Oral - Pudding Cup -- Oral - Honey Teaspoon Delayed oral transit;Lingual/palatal residue Oral - Honey Cup -- Oral - Nectar Teaspoon Premature spillage;Delayed oral transit;Lingual/palatal residue Oral - Nectar Cup -- Oral - Nectar Straw -- Oral - Thin Teaspoon Premature spillage;Delayed oral transit;Lingual/palatal residue Oral - Thin Cup -- Oral - Thin Straw Premature spillage;Lingual/palatal residue Oral - Puree Premature spillage;Delayed oral transit;Piecemeal  swallowing;Lingual/palatal residue Oral - Mech Soft -- Oral - Regular -- Oral - Multi-Consistency -- Oral - Pill -- Oral Phase - Comment --  CHL IP PHARYNGEAL PHASE 03/29/2020 Pharyngeal Phase Impaired Pharyngeal- Pudding Teaspoon -- Pharyngeal -- Pharyngeal- Pudding Cup -- Pharyngeal -- Pharyngeal- Honey Teaspoon Delayed swallow initiation-pyriform sinuses;Penetration/Aspiration before swallow;Pharyngeal residue - pyriform;Pharyngeal residue - valleculae;Pharyngeal residue - posterior pharnyx;Reduced pharyngeal peristalsis;Reduced anterior laryngeal mobility;Reduced laryngeal elevation;Reduced tongue base retraction;Reduced airway/laryngeal closure Pharyngeal Material enters airway, CONTACTS cords and not ejected out Pharyngeal- Honey Cup -- Pharyngeal -- Pharyngeal- Nectar Teaspoon Delayed swallow initiation-pyriform sinuses;Penetration/Aspiration before swallow;Penetration/Apiration after swallow;Trace aspiration;Pharyngeal residue - pyriform;Pharyngeal residue - valleculae;Pharyngeal residue - posterior pharnyx;Reduced pharyngeal peristalsis;Reduced anterior laryngeal mobility;Reduced laryngeal elevation;Reduced airway/laryngeal closure;Reduced tongue base retraction Pharyngeal Material enters airway, passes BELOW cords then ejected out Pharyngeal- Nectar Cup -- Pharyngeal -- Pharyngeal- Nectar Straw -- Pharyngeal -- Pharyngeal- Thin Teaspoon Delayed swallow initiation-pyriform sinuses;Reduced pharyngeal peristalsis;Reduced anterior laryngeal mobility;Reduced laryngeal elevation;Reduced airway/laryngeal closure;Reduced tongue base retraction;Penetration/Apiration after swallow;Pharyngeal residue - valleculae;Pharyngeal residue - pyriform;Pharyngeal residue - posterior pharnyx;Trace aspiration Pharyngeal Material enters airway, passes BELOW cords and not ejected out despite cough attempt by patient Pharyngeal- Thin Cup -- Pharyngeal -- Pharyngeal- Thin Straw Compensatory strategies attempted (with notebox);Delayed  swallow initiation-pyriform sinuses;Penetration/Aspiration before swallow;Penetration/Apiration after swallow;Moderate aspiration;Pharyngeal residue - valleculae;Pharyngeal residue - pyriform;Pharyngeal residue - posterior pharnyx;Reduced pharyngeal peristalsis;Reduced epiglottic inversion;Reduced anterior laryngeal mobility;Reduced laryngeal elevation;Reduced airway/laryngeal closure;Reduced tongue base retraction Pharyngeal Material enters airway, passes BELOW cords and not ejected out  despite cough attempt by patient Pharyngeal- Puree Delayed swallow initiation-pyriform sinuses;Penetration/Aspiration before swallow;Penetration/Apiration after swallow;Pharyngeal residue - valleculae;Pharyngeal residue - pyriform;Pharyngeal residue - posterior pharnyx;Reduced pharyngeal peristalsis;Reduced epiglottic inversion;Reduced anterior laryngeal mobility;Reduced laryngeal elevation;Reduced airway/laryngeal closure;Reduced tongue base retraction Pharyngeal Material enters airway, remains ABOVE vocal cords and not ejected out Pharyngeal- Mechanical Soft -- Pharyngeal -- Pharyngeal- Regular -- Pharyngeal -- Pharyngeal- Multi-consistency -- Pharyngeal -- Pharyngeal- Pill -- Pharyngeal -- Pharyngeal Comment --  CHL IP CERVICAL ESOPHAGEAL PHASE 03/29/2020 Cervical Esophageal Phase Impaired Pudding Teaspoon -- Pudding Cup -- Honey Teaspoon -- Honey Cup -- Nectar Teaspoon -- Nectar Cup -- Nectar Straw -- Thin Teaspoon -- Thin Cup -- Thin Straw -- Puree -- Mechanical Soft -- Regular -- Multi-consistency -- Pill -- Cervical Esophageal Comment suspected residue in the cervical esophagus (radiologist not present to confirm)  Colin Mulders., M.S., CCC-SLP Acute Rehabilitation Services Office: [phone] [place] 03/29/2020, 2:59 PM              ECHOCARDIOGRAM COMPLETE  Result Date: 03/22/2020    ECHOCARDIOGRAM REPORT   Patient Name:   COEN MIYASATO Date of Exam: 03/22/2020 Medical Rec #:  [id number]            Height:       70.0  in Accession #:    [id number]           Weight:       205.0 lb Date of Birth:  10-31-1945            BSA:          2.109 m Patient Age:    89 years             BP:           113/89 mmHg Patient Gender: M                    HR:           85 bpm. Exam Location:  Inpatient Procedure: 2D Echo Indications:    CHF-Acute Systolic 809.98 / P38.25  History:        Patient has prior history of Echocardiogram examinations, most                 recent 10/04/2011. CHF, Stroke; Risk Factors:Hypertension,                 Dyslipidemia and Non-Smoker.  Sonographer:    Leavy Cella Referring Phys: [id number] [place]  1. Akinesis of the anteroseptal, apical and basal inferior walls with overall severe LV dysfunction; grade 2 diastolic dysfunction; mild LVE; trace AI.  2. Left ventricular ejection fraction, by estimation, is 25 to 30%. The left ventricle has severely decreased function. The left ventricle demonstrates regional wall motion abnormalities (see scoring diagram/findings for description). The left ventricular internal cavity size was mildly dilated. Left ventricular diastolic parameters are consistent with Grade II diastolic dysfunction (pseudonormalization). Elevated left atrial pressure.  3. Right ventricular systolic function is normal. The right ventricular size is normal. There is mildly elevated pulmonary artery systolic pressure.  4. The mitral valve is normal in structure. No evidence of mitral valve regurgitation. No evidence of mitral stenosis.  5. The aortic valve is normal in structure. Aortic valve regurgitation is trivial. Mild to moderate aortic valve sclerosis/calcification is present, without any evidence of aortic stenosis.  6. The inferior vena cava is normal in size with greater than 50% respiratory variability, suggesting right atrial pressure  of 3 mmHg. FINDINGS  Left Ventricle: Left ventricular ejection fraction, by estimation, is 25 to 30%. The left ventricle has severely  decreased function. The left ventricle demonstrates regional wall motion abnormalities. The left ventricular internal cavity size was mildly  dilated. There is no left ventricular hypertrophy. Left ventricular diastolic parameters are consistent with Grade II diastolic dysfunction (pseudonormalization). Elevated left atrial pressure. Right Ventricle: The right ventricular size is normal.Right ventricular systolic function is normal. There is mildly elevated pulmonary artery systolic pressure. The tricuspid regurgitant velocity is 2.88 m/s, and with an assumed right atrial pressure of  3 mmHg, the estimated right ventricular systolic pressure is 75.9 mmHg. Left Atrium: Left atrial size was normal in size. Right Atrium: Right atrial size was normal in size. Pericardium: A small pericardial effusion is present. Mitral Valve: The mitral valve is normal in structure. Normal mobility of the mitral valve leaflets. Mild mitral annular calcification. No evidence of mitral valve regurgitation. No evidence of mitral valve stenosis. Tricuspid Valve: The tricuspid valve is normal in structure. Tricuspid valve regurgitation is mild . No evidence of tricuspid stenosis. Aortic Valve: The aortic valve is normal in structure. Aortic valve regurgitation is trivial. Aortic regurgitation PHT measures 388 msec. Mild to moderate aortic valve sclerosis/calcification is present, without any evidence of aortic stenosis. Pulmonic Valve: The pulmonic valve was normal in structure. Pulmonic valve regurgitation is mild. No evidence of pulmonic stenosis. Aorta: The aortic root is normal in size and structure. Venous: The inferior vena cava is normal in size with greater than 50% respiratory variability, suggesting right atrial pressure of 3 mmHg. IAS/Shunts: No atrial level shunt detected by color flow Doppler. Additional Comments: Akinesis of the anteroseptal, apical and basal inferior walls with overall severe LV dysfunction; grade 2 diastolic  dysfunction; mild LVE; trace AI.  LEFT VENTRICLE PLAX 2D LVIDd:         5.57 cm  Diastology LVIDs:         4.34 cm  LV e' lateral:   5.77 cm/s LV PW:         1.49 cm  LV E/e' lateral: 13.7 LV IVS:        1.05 cm  LV e' medial:    4.46 cm/s LVOT diam:     1.90 cm  LV E/e' medial:  17.8 LVOT Area:     2.84 cm  RIGHT VENTRICLE RV S prime:     11.30 cm/s TAPSE (M-mode): 2.2 cm LEFT ATRIUM           Index       RIGHT ATRIUM           Index LA diam:      3.80 cm 1.80 cm/m  RA Area:     16.80 cm LA Vol (A2C): 57.8 ml 27.40 ml/m RA Volume:   52.90 ml  25.08 ml/m LA Vol (A4C): 54.2 ml 25.69 ml/m  AORTIC VALVE AI PHT:      388 msec  AORTA Ao Root diam: 3.10 cm MITRAL VALVE               TRICUSPID VALVE MV Area (PHT): 4.15 cm    TR Peak grad:   33.2 mmHg MV Decel Time: 183 msec    TR Vmax:        288.00 cm/s MR Peak grad: 44.1 mmHg MR Mean grad: 29.0 mmHg    SHUNTS MR Vmax:      332.00 cm/s  Systemic Diam: 1.90 cm MR Vmean:  258.0 cm/s MV E velocity: 79.30 cm/s MV A velocity: 64.70 cm/s MV E/A ratio:  1.23 Kirk Ruths MD Electronically signed by Kirk Ruths MD Signature Date/Time: 03/22/2020/1:24:26 PM    Final    ECHO INTRAOPERATIVE TEE  Result Date: 03/28/2020  *INTRAOPERATIVE TRANSESOPHAGEAL REPORT *  Patient Name:   IVAR DOMANGUE Date of Exam: 03/27/2020 Medical Rec #:  [id number]            Height:       70.0 in Accession #:    [id number]           Weight:       186.4 lb Date of Birth:  [birth year]            BSA:          2.03 m Patient Age:    55 years             BP:           100/68 mmHg Patient Gender: M                    HR:           72 bpm. Exam Location:  Anesthesiology Transesophogeal exam was perform intraoperatively during surgical procedure. Patient was closely monitored under general anesthesia during the entirety of examination. 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 POST-OP IMPRESSIONS - Left Ventricle: Slightly improved LVF post bypass on inotropes. EF 30-35%. - Right Ventricle: The right ventricle appears unchanged from pre-bypass. - Aorta: The aorta appears unchanged from pre-bypass. - Left Atrium: The left atrium appears unchanged from pre-bypass. - Left Atrial Appendage: The left atrial appendage appears unchanged from pre-bypass. - Aortic Valve: The aortic valve appears unchanged from pre-bypass. - Mitral Valve: The mitral valve appears unchanged from pre-bypass. - Tricuspid Valve: The tricuspid valve appears unchanged from pre-bypass. - Interatrial Septum: The interatrial septum appears unchanged from pre-bypass. - Interventricular Septum: The interventricular septum appears unchanged from pre-bypass. - Pericardium: The pericardium appears unchanged from pre-bypass. PRE-OP FINDING  Left Ventricle: The left ventricle has severely reduced systolic function, with an ejection fraction of 25-30%. The cavity size was mildly dilated. There is no increase in left ventricular wall thickness. Right Ventricle: The right ventricle has normal systolic function. The cavity was normal. There is no increase in right ventricular wall thickness. Left Atrium: Left atrial size was normal in size.  Interatrial Septum: No atrial level shunt detected by color flow Doppler. Pericardium: There is no evidence of pericardial effusion. Mitral Valve: The mitral valve is normal in structure. Mild thickening of the mitral valve leaflet. Mitral valve regurgitation is mild by color flow Doppler. There is No evidence of mitral stenosis. Tricuspid Valve: The tricuspid valve was normal in structure. Tricuspid valve regurgitation is trivial by color flow Doppler. The tricuspid valve is mildly thickened. Aortic Valve: The aortic valve is tricuspid There is moderate  thickening of the aortic valve and There is mild calcification of the aortic valve Aortic valve regurgitation is trivial by color flow Doppler. There is no stenosis of the aortic valve. Pulmonic Valve: The pulmonic valve was normal in structure. Pulmonic valve regurgitation is trivial  by color flow Doppler.  Suzette Battiest MD Electronically signed by Suzette Battiest MD Signature Date/Time: 03/28/2020/9:18:55 AM    Final    VAS US DOPPLER PRE CABG  Result Date: 03/26/2020 PREOPERATIVE VASCULAR EVALUATION  Indications:      Pre-CABG. Risk Factors:     Hypertension, hyperlipidemia. Comparison Study: 10/03/2011- carotid artery duplex Performing Technologist: Maudry Mayhew MHA, RVT, RDCS, RDMS  Examination Guidelines: A complete evaluation includes B-mode imaging, spectral Doppler, color Doppler, and power Doppler as needed of all accessible portions of each vessel. Bilateral testing is considered an integral part of a complete examination. Limited examinations for reoccurring indications may be performed as noted.  Right Carotid Findings: +----------+--------+--------+--------+-----------------------+--------+           PSV cm/sEDV cm/sStenosisDescribe               Comments +----------+--------+--------+--------+-----------------------+--------+ CCA Prox  66      17              smooth and heterogenous         +----------+--------+--------+--------+-----------------------+--------+ CCA Distal56      18                                              +----------+--------+--------+--------+-----------------------+--------+ ICA Prox  69      21              smooth and heterogenous         +----------+--------+--------+--------+-----------------------+--------+ ICA Distal85      25                                              +----------+--------+--------+--------+-----------------------+--------+ ECA       56      7                                                +----------+--------+--------+--------+-----------------------+--------+ Portions of this table do not appear on this page. +----------+--------+-------+----------------+------------+           PSV cm/sEDV cmsDescribe        Arm Pressure +----------+--------+-------+----------------+------------+ [id number]             Multiphasic, WNL             +----------+--------+-------+----------------+------------+ +---------+--------+--+--------+--+---------+ VertebralPSV cm/s46EDV cm/s13Antegrade +---------+--------+--+--------+--+---------+ Left Carotid Findings: +----------+--------+--------+--------+-----------------------+--------+           PSV cm/sEDV cm/sStenosisDescribe               Comments +----------+--------+--------+--------+-----------------------+--------+ CCA Prox  68      18                                              +----------+--------+--------+--------+-----------------------+--------+ CCA Distal74      25              smooth and heterogenous         +----------+--------+--------+--------+-----------------------+--------+ ICA Prox  90      33              calcific                        +----------+--------+--------+--------+-----------------------+--------+  ICA Distal98      32                                              +----------+--------+--------+--------+-----------------------+--------+ ECA       48      15              smooth and heterogenous         +----------+--------+--------+--------+-----------------------+--------+ +----------+--------+--------+--------+------------+ SubclavianPSV cm/sEDV cm/sDescribeArm Pressure +----------+--------+--------+--------+------------+           86                                   +----------+--------+--------+--------+------------+ +---------+--------+--+--------+--+ VertebralPSV cm/s60EDV cm/s21 +---------+--------+--+--------+--+  ABI Findings:  +--------+------------------+-----+---------+--------+ Right   Rt Pressure (mmHg)IndexWaveform Comment  +--------+------------------+-----+---------+--------+ [id number]                    triphasic         +--------+------------------+-----+---------+--------+ PTA                            triphasic         +--------+------------------+-----+---------+--------+ DP                             triphasic         +--------+------------------+-----+---------+--------+ +--------+------------------+-----+---------+-------+ Left    Lt Pressure (mmHg)IndexWaveform Comment +--------+------------------+-----+---------+-------+ [id number]                     triphasic        +--------+------------------+-----+---------+-------+ PTA                            triphasic        +--------+------------------+-----+---------+-------+ DP                             triphasic        +--------+------------------+-----+---------+-------+  Right Doppler Findings: +--------+--------+-----+---------+--------+ Site    PressureIndexDoppler  Comments +--------+--------+-----+---------+--------+ [id number]          triphasic         +--------+--------+-----+---------+--------+ Radial               triphasic         +--------+--------+-----+---------+--------+ Ulnar                triphasic         +--------+--------+-----+---------+--------+  Left Doppler Findings: +--------+--------+-----+---------+--------+ Site    PressureIndexDoppler  Comments +--------+--------+-----+---------+--------+ [id number]           triphasic         +--------+--------+-----+---------+--------+ Radial               triphasic         +--------+--------+-----+---------+--------+ Ulnar                triphasic         +--------+--------+-----+---------+--------+  Summary: Right Carotid: Velocities in the right ICA are consistent with a 1-39% stenosis. Left Carotid:  Velocities in the left ICA are consistent with a 1-39% stenosis.               There is  evidence by color Doppler and spectral Doppler of an area               of reversed flow in the left ICA, as well as arterialized flow in               the left IJV, with evidence of possible communication of the two               vessels. 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 Vertebrals:  Bilateral vertebral arteries demonstrate antegrade flow. Subclavians: Normal flow hemodynamics were seen in bilateral subclavian              arteries. Right Upper Extremity: Doppler waveform obliterate with right radial compression. Doppler waveforms remain within normal limits with right ulnar compression. Left Upper Extremity: Doppler waveforms remain within normal limits with left radial compression. Doppler waveforms decrease >50% with left ulnar compression.  Electronically signed by Monica Martinez MD on 03/26/2020 at 4:13:57 PM.    Final    [place] EKG SITE RITE  Result Date: 03/29/2020 If Site Rite image not attached, placement could not be confirmed due to current cardiac rhythm.     Discharge Medications: Allergies as of 04/09/2020       Reactions   Morphine And Related Nausea And Vomiting        Medication List     STOP taking these medications    aspirin 81 MG tablet Replaced by: aspirin 81 MG chewable tablet   ferrous sulfate 325 (65 FE) MG tablet   fluconazole 200 MG tablet Commonly known as: DIFLUCAN   fludrocortisone 0.1 MG tablet Commonly known as: FLORINEF   levofloxacin 500 MG tablet Commonly known as: Levaquin   simvastatin 20 MG tablet Commonly known as: ZOCOR   VITAMIN B-12 PO       TAKE these medications    acetaminophen 160 MG/5ML solution Commonly known as: TYLENOL Take 20.3 mLs (650 mg total) by mouth every 6 (six) hours as needed for headache.   albuterol 108 (90 Base) MCG/ACT inhaler Commonly known as: VENTOLIN HFA Inhale 2 puffs into the lungs in the morning, at  noon, and at bedtime.   aspirin 81 MG chewable tablet Place 1 tablet (81 mg total) into feeding tube daily. Replaces: aspirin 81 MG tablet   diphenhydrAMINE 12.5 MG/5ML elixir Commonly known as: BENADRYL Take 10 mLs (25 mg total) by mouth at bedtime as needed for sleep.   feeding supplement (OSMOLITE 1.5 CAL) Liqd Place 237 mLs into feeding tube 5 (five) times daily.   feeding supplement (OSMOLITE 1.5 CAL) Liqd Place 355 mLs into feeding tube 4 (four) times daily.   feeding supplement (PRO-STAT SUGAR FREE 64) Liqd Place 30 mLs into feeding tube 3 (three) times daily.   lamoTRIgine 100 MG tablet Commonly known as: LAMICTAL TAKE 1 TABLET PER TUBE IN THE MORNING AND 2 TABLET PER TUBE IN THE EVENING. Must be seen for further refills. Call [phone] to schedule. What changed: additional instructions   LUPRON DEPOT (32-MONTH) IM Inject 1 each into the muscle every 3 (three) months.   midodrine 5 MG tablet Commonly known as: PROAMATINE Take 1 tablet (5 mg total) by mouth 2 (two) times daily with a meal.   oxyCODONE 5 MG/5ML solution Commonly known as: ROXICODONE Place 5 mLs (5 mg total) into feeding tube every 6 (six) hours as needed for up to 10 days for severe pain.   pantoprazole sodium 40 mg/20 mL Pack Commonly known as: PROTONIX  Place 20 mLs (40 mg total) into feeding tube daily.   rosuvastatin 20 MG tablet Commonly known as: CRESTOR TAKE 1 TABLET(20 MG) BY MOUTH DAILY               Durable Medical Equipment  (From admission, onward)           Start     Ordered   04/09/20 1038  For home use only DME Tube feeding  Once       Comments: Osmolite 1.5 @60ml /hr- Initiate at 14ml/hr and increase by 42ml/hr q 12 hours until goal rate is reached.    Prostat liquid protein 17ml daily via tube; each supplement provides 100 kcal, 15 grams protein.   Free water flushes 46ml q4 hours via tube Greater than 90 days   Regimen provides 2260kcal/day, 105g/day protein,  1429ml/day free water   04/09/20 1039   04/08/20 1128  For home use only DME 4 wheeled rolling walker with seat  Once       Question:  Patient needs a walker to treat with the following condition  Answer:  Orthostatic hypotension   04/08/20 1127            TF recommendations per nutrition:  Bolus tube feeding regimen via G-tube: - 1.5 cartons (355 ml) of Osmolite 1.5 cal formula 4 times daily at 0800, 1200, 1600, and 2000 (total of 6 cartons daily)   Tube feeding regimen provides 2133 kcal, 89 grams of protein, and 1084 ml of H2O (97% of kcal needs, 81% of protein needs).   - 1 Boost High Protein oral nutrition supplement po daily (bringing from home), each supplement provides 240 kcal and 20 grams of protein   Tube feeding regimen and 1 Boost High Protein supplement will provide 2373 kcal and 109 grams of protein daily (100% of kcal needs, 99% of protein needs).   1.Beta Blocker:  Yes [   ]                              No   [  no ]                              If No, reason: hypotension  2.Ace Inhibitor/ARB: Yes [   ]                                     No  [  no  ]                                     If No, reason: normotensive  3.Statin:   Yes [ yes  ]                  No  [   ]                  If No, reason:  4.Ecasa:  Yes  [place]  ]                  No   [   ]                  If No, reason:  Follow Up Appointments:  Follow-up  Information     Ivin Poot, MD Follow up.   Specialty: Cardiothoracic Surgery Why: PA/LAT CXR to be taken (at [facility] which is in the same building as Dr. Lucianne Lei Trigt's office at 11:30am) on at;Appointment time is on 04/29/2020 at 12:00 noon.  Contact information: [place] [address] [phone]         [name], [place]. Go on 04/15/2020.   Specialties: Cardiology, Radiology Why: Appointment time is at 3:45 pm  Contact information: [address] [place]  [address] [place], [facility] Follow up.   Specialty: Home Health Services Why: [place] information: [place] [address] [phone]         Josetta Huddle, MD. Call in 1 day(s).   Specialty: Internal Medicine Contact information: [address] [place] [address] [phone]         Pixie Casino, MD .   Specialty: Cardiology Contact information: [address] [place] [address] [phone]         [facility] Follow up.   Why: For tube feeds supplies                Signed: Joline Maxcy 04/09/2020, 12:53 PM   patient examined and medical record reviewed,agree with above note. Tharon Aquas Trigt III 04/15/2020

## 2020-03-27 NOTE — Progress Notes (Signed)
Pre Procedure note for inpatients:   Connor [name] has been scheduled for Procedure(s): CORONARY ARTERY BYPASS GRAFTING (CABG) (N/A) TRANSESOPHAGEAL ECHOCARDIOGRAM (TEE) (N/A) today. The various methods of treatment have been discussed with the patient. After consideration of the risks, benefits and treatment options the patient has consented to the planned procedure.   The patient has been seen and labs reviewed. There are no changes in the patient's condition to prevent proceeding with the planned procedure today.  Recent labs:  Lab Results  Component Value Date   WBC 6.7 03/27/2020   HGB 9.4 (L) 03/27/2020   HCT 29.9 (L) 03/27/2020   PLT 328 03/27/2020   GLUCOSE 103 (H) 03/27/2020   CHOL 149 12/05/2011   TRIG 123 12/05/2011   HDL 56 12/05/2011   LDLCALC 68 12/05/2011   ALT 11 03/23/2020   AST 17 03/23/2020   NA 132 (L) 03/27/2020   K 4.4 03/27/2020   CL 91 (L) 03/27/2020   CREATININE 1.63 (H) 03/27/2020   BUN 27 (H) 03/27/2020   CO2 32 03/27/2020   TSH 1.157 03/26/2020   INR 1.1 03/26/2020   HGBA1C 4.9 03/26/2020    [name], MD 03/27/2020 7:32 AM

## 2020-03-27 NOTE — Anesthesia Procedure Notes (Signed)
Central Venous Catheter Insertion Performed by: Suzette Battiest, MD, anesthesiologist Start/End6/01/2020 6:55 AM, 03/27/2020 7:10 AM Patient location: Pre-op. Preanesthetic checklist: patient identified, IV checked, site marked, risks and benefits discussed, surgical consent, monitors and equipment checked, pre-op evaluation, timeout performed and anesthesia consent Hand hygiene performed  and maximum sterile barriers used  PA cath was placed.Swan type:thermodilution PA Cath depth:50 Procedure performed without using ultrasound guided technique. Attempts: 1 Patient tolerated the procedure well with no immediate complications.

## 2020-03-27 NOTE — Anesthesia Postprocedure Evaluation (Signed)
Anesthesia Post Note  Patient: Connor [name]  Procedure(s) Performed: CORONARY ARTERY BYPASS GRAFTING (CABG) x Three, using left internal mammary artery and right leg greater saphenous vein harvested endoscopically (N/A Chest) TRANSESOPHAGEAL ECHOCARDIOGRAM (TEE) (N/A )     Patient location during evaluation: SICU Anesthesia Type: General Level of consciousness: sedated Pain management: pain level controlled Vital Signs Assessment: post-procedure vital signs reviewed and stable Respiratory status: patient remains intubated per anesthesia plan Cardiovascular status: stable Postop Assessment: no apparent nausea or vomiting Anesthetic complications: no    Last Vitals:  Vitals:   03/27/20 1602 03/27/20 1620  BP:  (!) 107/52  Pulse: 86 86  Resp: 10 10  Temp: (!) 35.4 C   SpO2: 99% 99%    Last Pain:  Vitals:   03/27/20 0525  TempSrc: Oral  PainSc:                  Tiajuana Amass

## 2020-03-27 NOTE — Procedures (Signed)
Extubation Procedure Note  Patient Details:   Name: Connor [name] DOB: 09-09-1946 MRN: [id number]   Airway Documentation:  Airway 8 mm (Active)  Secured at (cm) 24 cm 03/27/20 2000  Measured From Lips 03/27/20 2000  Secured Location Right 03/27/20 2000  Secured By Pink Tape 03/27/20 2000  Site Condition Dry 03/27/20 2000   Vent end date: (not recorded) Vent end time: (not recorded)   Evaluation  O2 sats: stable throughout Complications: No apparent complications Patient did tolerate procedure well. Bilateral Breath Sounds: Clear, Diminished   Yes  Pt extubated to 4l [place] PT passed all rapid wean parameters including VC NIF and ABG  Levora Dredge 03/27/2020, 9:50 PM

## 2020-03-27 NOTE — Progress Notes (Addendum)
Could not see the patient as he spent most of the day in the OR, now intubated in the ICU.

## 2020-03-27 NOTE — Anesthesia Procedure Notes (Signed)
Central Venous Catheter Insertion Performed by: Suzette Battiest, MD, anesthesiologist Start/End6/01/2020 6:55 AM, 03/27/2020 7:10 AM Patient location: Pre-op. Preanesthetic checklist: patient identified, IV checked, site marked, risks and benefits discussed, surgical consent, monitors and equipment checked, pre-op evaluation, timeout performed and anesthesia consent Position: Trendelenburg Lidocaine 1% used for infiltration and patient sedated Hand hygiene performed , maximum sterile barriers used  and Seldinger technique used Catheter size: 9 Fr Total catheter length 10. Central line and PA cath was placed.MAC introducer Swan type:thermodilution PA Cath depth:50 Procedure performed using ultrasound guided technique. Ultrasound Notes:anatomy identified, needle tip was noted to be adjacent to the nerve/plexus identified, no ultrasound evidence of intravascular and/or intraneural injection and image(s) printed for medical record Attempts: 1 Following insertion, line sutured, dressing applied and Biopatch. Post procedure assessment: blood return through all ports, free fluid flow and no air  Patient tolerated the procedure well with no immediate complications.

## 2020-03-27 NOTE — Brief Op Note (Addendum)
03/21/2020 - 03/27/2020  11:57 AM  PATIENT:  Connor [name]  74 y.o. male  PRE-OPERATIVE DIAGNOSIS:  Coronary artery disease  POST-OPERATIVE DIAGNOSIS:  Coronary artery disease  PROCEDURE:  TRANSESOPHAGEAL ECHOCARDIOGRAM (TEE), CORONARY ARTERY BYPASS GRAFTING (CABG) x 3 (LIMA to LAD, SVG to OM, SVG to PDA) using left internal mammary artery and right leg greater saphenous vein harvested endoscopically   SURGEON:  Surgeon(s) and Role:    Ivin Poot, MD - Primary  PHYSICIAN ASSISTANT: Lars Pinks PA-C  ANESTHESIA:   general  EBL: 400cc  BLOOD ADMINISTERED:2 PRBC, FFP  DRAINS: Chest tubes placed in the mediastinal and pleural spaces   COUNTS CORRECT:  YES  DICTATION: .Dragon Dictation  PLAN OF CARE: Admit to inpatient   PATIENT DISPOSITION:  ICU - intubated and hemodynamically stable.   Delay start of Pharmacological VTE agent (>24hrs) due to surgical blood loss or risk of bleeding: yes  BASELINE WEIGHT: 84.6 kg

## 2020-03-27 NOTE — Anesthesia Procedure Notes (Signed)
Arterial Line Insertion Start/End6/01/2020 6:30 AM, 03/27/2020 6:40 AM Performed by: Lance Coon, CRNA, CRNA  Preanesthetic checklist: patient identified, IV checked, site marked, risks and benefits discussed, surgical consent, monitors and equipment checked, pre-op evaluation, timeout performed and anesthesia consent Lidocaine 1% used for infiltration Right, radial was placed Catheter size: 20 G Hand hygiene performed , maximum sterile barriers used  and Seldinger technique used  Attempts: 1 Procedure performed without using ultrasound guided technique. Following insertion, dressing applied and Biopatch. Post procedure assessment: normal and unchanged  Patient tolerated the procedure well with no immediate complications.

## 2020-03-27 NOTE — Anesthesia Procedure Notes (Signed)
Procedure Name: Intubation Date/Time: 03/27/2020 7:50 AM Performed by: Lance Coon, CRNA Pre-anesthesia Checklist: Patient identified, Emergency Drugs available, Suction available, Patient being monitored and Timeout performed Patient Re-evaluated:Patient Re-evaluated prior to induction Oxygen Delivery Method: Circle system utilized Preoxygenation: Pre-oxygenation with 100% oxygen Induction Type: IV induction Ventilation: Mask ventilation without difficulty and Oral airway inserted - appropriate to patient size Laryngoscope Size: Glidescope and 4 Grade View: Grade II Tube type: Oral Tube size: 8.0 mm Number of attempts: 1 Airway Equipment and Method: Stylet and Video-laryngoscopy Placement Confirmation: ETT inserted through vocal cords under direct vision,  positive ETCO2 and breath sounds checked- equal and bilateral Secured at: 23 cm Tube secured with: Tape Dental Injury: Teeth and Oropharynx as per pre-operative assessment  Difficulty Due To: Difficulty was anticipated, Difficult Airway- due to reduced neck mobility and Difficult Airway- due to limited oral opening Future Recommendations: Recommend- induction with short-acting agent, and alternative techniques readily available

## 2020-03-27 NOTE — Progress Notes (Signed)
  Echocardiogram Echocardiogram Transesophageal has been performed.  Darlina Sicilian M 03/27/2020, 8:16 AM

## 2020-03-28 ENCOUNTER — Inpatient Hospital Stay (HOSPITAL_COMMUNITY): Payer: PPO

## 2020-03-28 LAB — COMPREHENSIVE METABOLIC PANEL
ALT: 14 U/L (ref 0–44)
AST: 36 U/L (ref 15–41)
Albumin: 3.2 g/dL — ABNORMAL LOW (ref 3.5–5.0)
Alkaline Phosphatase: 51 U/L (ref 38–126)
Anion gap: 8 (ref 5–15)
BUN: 18 mg/dL (ref 8–23)
CO2: 24 mmol/L (ref 22–32)
Calcium: 8.4 mg/dL — ABNORMAL LOW (ref 8.9–10.3)
Chloride: 100 mmol/L (ref 98–111)
Creatinine, Ser: 1.34 mg/dL — ABNORMAL HIGH (ref 0.61–1.24)
GFR calc Af Amer: >60 mL/min (ref >60)
GFR calc non Af Amer: 53 mL/min — ABNORMAL LOW (ref >60)
Glucose, Bld: 156 mg/dL — ABNORMAL HIGH (ref 70–99)
Potassium: 4 mmol/L (ref 3.5–5.1)
Sodium: 132 mmol/L — ABNORMAL LOW (ref 135–145)
Total Bilirubin: 1.2 mg/dL (ref 0.3–1.2)
Total Protein: 5.4 g/dL — ABNORMAL LOW (ref 6.5–8.1)

## 2020-03-28 LAB — PREPARE FRESH FROZEN PLASMA
Unit division: 0
Unit division: 0

## 2020-03-28 LAB — BASIC METABOLIC PANEL
Anion gap: 12 (ref 5–15)
BUN: 17 mg/dL (ref 8–23)
CO2: 24 mmol/L (ref 22–32)
Calcium: 8.6 mg/dL — ABNORMAL LOW (ref 8.9–10.3)
Chloride: 98 mmol/L (ref 98–111)
Creatinine, Ser: 1.21 mg/dL (ref 0.61–1.24)
GFR calc Af Amer: >60 mL/min (ref >60)
GFR calc non Af Amer: 59 mL/min — ABNORMAL LOW (ref >60)
Glucose, Bld: 150 mg/dL — ABNORMAL HIGH (ref 70–99)
Potassium: 3.8 mmol/L (ref 3.5–5.1)
Sodium: 134 mmol/L — ABNORMAL LOW (ref 135–145)

## 2020-03-28 LAB — ECHO INTRAOPERATIVE TEE
Height: 70 in
Weight: 2982.4 oz

## 2020-03-28 LAB — BPAM FFP
Blood Product Expiration Date: 202106082359
Blood Product Expiration Date: 202106082359
ISSUE DATE / TIME: 202106041308
ISSUE DATE / TIME: 202106041308
Unit Type and Rh: 5100
Unit Type and Rh: 5100

## 2020-03-28 LAB — POCT I-STAT 7, (LYTES, BLD GAS, ICA,H+H)
Acid-Base Excess: 1 mmol/L (ref 0.0–2.0)
Acid-Base Excess: 0 mmol/L (ref 0.0–2.0)
Acid-base deficit: 1 mmol/L (ref 0.0–2.0)
Acid-base deficit: 1 mmol/L (ref 0.0–2.0)
Bicarbonate: 26.3 mmol/L (ref 20.0–28.0)
Bicarbonate: 23.9 mmol/L (ref 20.0–28.0)
Bicarbonate: 23.9 mmol/L (ref 20.0–28.0)
Bicarbonate: 24.8 mmol/L (ref 20.0–28.0)
Calcium, Ion: 1.26 mmol/L (ref 1.15–1.40)
Calcium, Ion: 1.24 mmol/L (ref 1.15–1.40)
Calcium, Ion: 1.24 mmol/L (ref 1.15–1.40)
Calcium, Ion: 1.24 mmol/L (ref 1.15–1.40)
HCT: 25 % — ABNORMAL LOW (ref 39.0–52.0)
HCT: 24 % — ABNORMAL LOW (ref 39.0–52.0)
HCT: 24 % — ABNORMAL LOW (ref 39.0–52.0)
HCT: 25 % — ABNORMAL LOW (ref 39.0–52.0)
Hemoglobin: 8.5 g/dL — ABNORMAL LOW (ref 13.0–17.0)
Hemoglobin: 8.2 g/dL — ABNORMAL LOW (ref 13.0–17.0)
Hemoglobin: 8.2 g/dL — ABNORMAL LOW (ref 13.0–17.0)
Hemoglobin: 8.5 g/dL — ABNORMAL LOW (ref 13.0–17.0)
O2 Saturation: 96 %
O2 Saturation: 95 %
O2 Saturation: 94 %
O2 Saturation: 95 %
Patient temperature: 36.9
Patient temperature: 36.8
Patient temperature: 36.6
Patient temperature: 36.6
Potassium: 4.3 mmol/L (ref 3.5–5.1)
Potassium: 4 mmol/L (ref 3.5–5.1)
Potassium: 4 mmol/L (ref 3.5–5.1)
Potassium: 4.1 mmol/L (ref 3.5–5.1)
Sodium: 136 mmol/L (ref 135–145)
Sodium: 137 mmol/L (ref 135–145)
Sodium: 137 mmol/L (ref 135–145)
Sodium: 135 mmol/L (ref 135–145)
TCO2: 28 mmol/L (ref 22–32)
TCO2: 25 mmol/L (ref 22–32)
TCO2: 25 mmol/L (ref 22–32)
TCO2: 26 mmol/L (ref 22–32)
pCO2 arterial: 43 mmHg (ref 32.0–48.0)
pCO2 arterial: 39.3 mmHg (ref 32.0–48.0)
pCO2 arterial: 39.9 mmHg (ref 32.0–48.0)
pCO2 arterial: 40.1 mmHg (ref 32.0–48.0)
pH, Arterial: 7.393 (ref 7.350–7.450)
pH, Arterial: 7.392 (ref 7.350–7.450)
pH, Arterial: 7.384 (ref 7.350–7.450)
pH, Arterial: 7.398 (ref 7.350–7.450)
pO2, Arterial: 85 mmHg (ref 83.0–108.0)
pO2, Arterial: 75 mmHg — ABNORMAL LOW (ref 83.0–108.0)
pO2, Arterial: 70 mmHg — ABNORMAL LOW (ref 83.0–108.0)
pO2, Arterial: 73 mmHg — ABNORMAL LOW (ref 83.0–108.0)

## 2020-03-28 LAB — MAGNESIUM
Magnesium: 2.3 mg/dL (ref 1.7–2.4)
Magnesium: 2.2 mg/dL (ref 1.7–2.4)

## 2020-03-28 LAB — GLUCOSE, CAPILLARY
Glucose-Capillary: 131 mg/dL — ABNORMAL HIGH (ref 70–99)
Glucose-Capillary: 132 mg/dL — ABNORMAL HIGH (ref 70–99)
Glucose-Capillary: 135 mg/dL — ABNORMAL HIGH (ref 70–99)
Glucose-Capillary: 137 mg/dL — ABNORMAL HIGH (ref 70–99)
Glucose-Capillary: 146 mg/dL — ABNORMAL HIGH (ref 70–99)
Glucose-Capillary: 149 mg/dL — ABNORMAL HIGH (ref 70–99)
Glucose-Capillary: 150 mg/dL — ABNORMAL HIGH (ref 70–99)
Glucose-Capillary: 151 mg/dL — ABNORMAL HIGH (ref 70–99)
Glucose-Capillary: 151 mg/dL — ABNORMAL HIGH (ref 70–99)
Glucose-Capillary: 151 mg/dL — ABNORMAL HIGH (ref 70–99)
Glucose-Capillary: 161 mg/dL — ABNORMAL HIGH (ref 70–99)
Glucose-Capillary: 166 mg/dL — ABNORMAL HIGH (ref 70–99)
Glucose-Capillary: 172 mg/dL — ABNORMAL HIGH (ref 70–99)

## 2020-03-28 LAB — BPAM PLATELET PHERESIS
Blood Product Expiration Date: 202106052359
ISSUE DATE / TIME: 202106041339
Unit Type and Rh: 6200

## 2020-03-28 LAB — CBC
HCT: 25.5 % — ABNORMAL LOW (ref 39.0–52.0)
HCT: 25.3 % — ABNORMAL LOW (ref 39.0–52.0)
Hemoglobin: 8.2 g/dL — ABNORMAL LOW (ref 13.0–17.0)
Hemoglobin: 8 g/dL — ABNORMAL LOW (ref 13.0–17.0)
MCH: 28.5 pg (ref 26.0–34.0)
MCH: 28.1 pg (ref 26.0–34.0)
MCHC: 32.2 g/dL (ref 30.0–36.0)
MCHC: 31.6 g/dL (ref 30.0–36.0)
MCV: 88.5 fL (ref 80.0–100.0)
MCV: 88.8 fL (ref 80.0–100.0)
Platelets: 207 10*3/uL (ref 150–400)
Platelets: 185 10*3/uL (ref 150–400)
RBC: 2.88 MIL/uL — ABNORMAL LOW (ref 4.22–5.81)
RBC: 2.85 MIL/uL — ABNORMAL LOW (ref 4.22–5.81)
RDW: 14.6 % (ref 11.5–15.5)
RDW: 14.8 % (ref 11.5–15.5)
WBC: 12.9 10*3/uL — ABNORMAL HIGH (ref 4.0–10.5)
WBC: 13.6 10*3/uL — ABNORMAL HIGH (ref 4.0–10.5)
nRBC: 0 % (ref 0.0–0.2)
nRBC: 0 % (ref 0.0–0.2)

## 2020-03-28 LAB — PREPARE PLATELET PHERESIS: Unit division: 0

## 2020-03-28 LAB — COOXEMETRY PANEL
Carboxyhemoglobin: 2 % — ABNORMAL HIGH (ref 0.5–1.5)
Methemoglobin: 1.3 % (ref 0.0–1.5)
O2 Saturation: 64.7 %
Total hemoglobin: 9.6 g/dL — ABNORMAL LOW (ref 12.0–16.0)

## 2020-03-28 MED ORDER — ASPIRIN EC 81 MG PO TBEC
81.0000 mg | DELAYED_RELEASE_TABLET | Freq: Every day | ORAL | Status: DC
  Administered 2020-03-30 – 2020-03-31 (×2): 81 mg via ORAL
  Filled 2020-03-28 (×4): qty 1

## 2020-03-28 MED ORDER — FENTANYL CITRATE (PF) 100 MCG/2ML IJ SOLN
25.0000 ug | INTRAMUSCULAR | Status: DC | PRN
  Administered 2020-03-28 (×3): 25 ug via INTRAVENOUS
  Filled 2020-03-28 (×3): qty 2

## 2020-03-28 MED ORDER — LEVOFLOXACIN IN D5W 500 MG/100ML IV SOLN
500.0000 mg | INTRAVENOUS | Status: DC
  Administered 2020-03-28 – 2020-04-07 (×11): 500 mg via INTRAVENOUS
  Filled 2020-03-28 (×13): qty 100

## 2020-03-28 MED ORDER — METOPROLOL TARTRATE 25 MG/10 ML ORAL SUSPENSION
12.5000 mg | Freq: Two times a day (BID) | ORAL | Status: DC
  Administered 2020-03-30: 12.5 mg
  Filled 2020-03-28: qty 5

## 2020-03-28 MED ORDER — DOCUSATE SODIUM 50 MG/5ML PO LIQD
200.0000 mg | Freq: Every day | ORAL | Status: DC
  Administered 2020-03-30 – 2020-04-09 (×6): 200 mg via ORAL
  Filled 2020-03-28 (×9): qty 20

## 2020-03-28 MED ORDER — FENTANYL CITRATE (PF) 100 MCG/2ML IJ SOLN
50.0000 ug | INTRAMUSCULAR | Status: DC | PRN
  Administered 2020-03-28 – 2020-03-29 (×9): 50 ug via INTRAVENOUS
  Filled 2020-03-28 (×9): qty 2

## 2020-03-28 MED ORDER — MIDAZOLAM HCL 2 MG/2ML IJ SOLN: 1.0000 mg | INTRAMUSCULAR | Status: DC | PRN

## 2020-03-28 MED ORDER — INSULIN ASPART 100 UNIT/ML ~~LOC~~ SOLN
0.0000 [IU] | SUBCUTANEOUS | Status: DC
  Administered 2020-03-28 – 2020-04-02 (×11): 2 [IU] via SUBCUTANEOUS

## 2020-03-28 MED ORDER — METOPROLOL TARTRATE 12.5 MG HALF TABLET
12.5000 mg | ORAL_TABLET | Freq: Two times a day (BID) | ORAL | Status: DC
  Filled 2020-03-28: qty 1

## 2020-03-28 MED ORDER — NOREPINEPHRINE 4 MG/250ML-% IV SOLN
0.0000 ug/min | INTRAVENOUS | Status: DC
  Administered 2020-03-28: 3 ug/min via INTRAVENOUS
  Filled 2020-03-28: qty 250

## 2020-03-28 MED ORDER — ROSUVASTATIN CALCIUM 20 MG PO TABS
20.0000 mg | ORAL_TABLET | Freq: Every day | ORAL | Status: DC
  Administered 2020-03-28 – 2020-04-09 (×11): 20 mg via ORAL
  Filled 2020-03-28 (×13): qty 1

## 2020-03-28 MED ORDER — ENOXAPARIN SODIUM 30 MG/0.3ML ~~LOC~~ SOLN: 30.0000 mg | Freq: Every day | SUBCUTANEOUS | Status: DC

## 2020-03-28 MED ORDER — FUROSEMIDE 10 MG/ML IJ SOLN
20.0000 mg | Freq: Two times a day (BID) | INTRAMUSCULAR | Status: DC
  Administered 2020-03-28 – 2020-03-30 (×6): 20 mg via INTRAVENOUS
  Filled 2020-03-28 (×5): qty 2

## 2020-03-28 NOTE — Progress Notes (Signed)
Spoke with pt's ex-wife Connor [name] (who is a strong support person per pt).  Connor [name] notified me of pt's recent jaw surgery on 02/04/2020.  He has an open wound to inside of back part of mouth.  With this surgery, pt had a couple of episodes with it profusely bleeding which necessitated going to ER in [facility].  Pt on Levaquin po pta per ID MD Graylon Good).  Connor [name] states that pt is to complete this course of therapy for "another couple of weeks " per ID MD.  Per Nancy--pt seeing ID MD d/t multiple tooth/jaw infections.  Dr. Darcey Nora notified.   Post jaw surgery-pt told to clean pocketed area in jaw with saline and syringe BID.  This nursing order will be placed by this RN.  Will continue to closely monitor. Pt was told by oral surgeon to eat a puree/soft diet x91mths.  Has only been eating pudding and applesauce since 4/13.   Morning po meds were crushed and put into applesauce.  After a small bite, pt started coughing and took a bit of time for him to clear.  Pt immediately placed NPO, speech eval ordered.  Dr. Darcey Nora aware and agreed.  Will remain NPO until further testing by SLP.  Will continue to closely monitor.

## 2020-03-28 NOTE — Progress Notes (Signed)
Nutrition Follow-up  RD working remotely.  DOCUMENTATION CODES:   Non-severe (moderate) malnutrition in context of chronic illness  INTERVENTION:   Once Cortrak tube in place, recommend:  Osmolite 1.5 '@60ml'$ /hr- Initiate at 80m/hr and increase by 159mhr q 12 hours until goal rate is reached.   Prostat liquid protein 3051maily via tube; each supplement provides 100 kcal, 15 grams protein.  Free water flushes 88m54m hours via tube  Regimen provides 2260kcal/day, 105g/day protein, 1457ml61m free water   Pt at high refeed risk; recommend monitor K, Mg and P labs daily until stable  NUTRITION DIAGNOSIS:   Moderate Malnutrition related to chronic illness(L mandible osteomyelitis/necrosis) as evidenced by mild fat depletion, mild muscle depletion. 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 Pt with poor appetite and oral intake pta r/t recent removal of part of his jaw; pt was told by the oral surgeon not to eat anything that requires chewing, so pt has been eating mainly pudding and popsicles. Pt was seen by SLP today as he was noted to be  coughing with liquids and meds; SLP recommended NPO. Plan is for cortrak tube placement and nutrition support as pt has now been without adequate nutrition for > 7 days. Pt is likely at high refeed risk.   Per chart, pt is down 12lbs(6%) since admit; this is likely r/t fluid changes.      Medications reviewed and include: aspirin, dulcolax, colace, lasix, insulin, reglan, protonix, cefuroxine, insulin, levophed, neosynephrine   Labs reviewed: creat 1.34(H) Wbc- 12.9(H), Hgb 8.5(L), Hct 25.0(L) cbgs- 151, 146, 150, 161, 137 x 24 hrs  MAP- >65mmH46mDiet Order:   Diet Order            Diet NPO time specified  Diet effective now             EDUCATION NEEDS:   Education needs have been addressed  Skin:  Skin Assessment: Reviewed RN Assessment  Last BM:  5/28- constipation  Height:   Ht Readings from Last 1 Encounters:  03/22/20 '5\' 10"'$  (1.778 m)    Weight:   Wt Readings from Last 1 Encounters:  03/28/20 87.9 kg   BMI:  Body mass index is 27.81 kg/m.  Estimated Nutritional Needs:   Kcal:  2100-2300 kcal  Protein:  105-120 grams  Fluid:  >/= 2 L/day  Hannie Shoe Koleen DistanceD, LDN Please refer to [facility] and/or RD on-call/weekend/after hours pager

## 2020-03-28 NOTE — Evaluation (Signed)
Clinical/Bedside Swallow Evaluation Patient Details  Name: Connor [name] MRN: [id number] Date of Birth: 02/08/1946  Today's Date: 03/28/2020 Time: SLP Start Time (ACUTE ONLY): 1210 SLP Stop Time (ACUTE ONLY): 1234 SLP Time Calculation (min) (ACUTE ONLY): 24 min  Past Medical History:  Past Medical History:  Diagnosis Date  . Arthritis    OA AND PAIN RT KNEE  . Cancer ([place])    h/o neck - ABOUT 6 YRS AGO - TX'D WITH RADIATION AND CHEMO   . Chronic kidney disease   . GERD without esophagitis 03/21/2020  . Head and neck cancer ~ 2009   S/P radiation & [name]  . Headache(784.0)   . Hyperlipidemia   . Hypertension   . Kidney carcinoma ([place])    h/o - NEPHRECTOMY   . Osteonecrosis of jaw ([place])    Secondary to radiation therapy  . Prostate cancer ([place]) 05/20/14   Gleason 4+3=7, volume 25 gm  . Radiation 2015   hx of, prostate cancer  . Seizures ([place])    hx of x yrs, "the bad kind;bite tongue; STATES LAST [place] 2013; WAS SEEING [name] - HE RETIRED AND PT LAST SAW DR. [name]  . Stroke ([place]) [place] 2013   UNABLE TO SPEAK OR MOVE AND RT SIDE WEAKNESS AND LOSS OF SKIN SENSITIVITY TO HEAT AND COLD ON RT SIDE, DOUBLE VISION. BALANCE PROBLEMS---STATES STILL HAS DOUBLE VISION AND BALANCE PROBLEM AND RT SIDED LOSS OF SKIN SENSITIVITY   Past Surgical History:  Past Surgical History:  Procedure Laterality Date  . hydrocelectomy  11/2000   left  . IR FLUORO GUIDE CV LINE RIGHT  08/31/2017  . IR US GUIDE VASC ACCESS RIGHT  08/31/2017  . JOINT REPLACEMENT     LEFT TOTAL KNEE ARTHROPLASTY  . MOUTH SURGERY  2019  . NEPHRECTOMY  1990's   left  . PROSTATE BIOPSY  05/20/14   Gleason 4+3=7, vol 25 gm  . RIGHT/LEFT HEART CATH AND CORONARY ANGIOGRAPHY N/A 03/24/2020   Procedure: RIGHT/LEFT HEART CATH AND CORONARY ANGIOGRAPHY;  Surgeon: Burnell Blanks, MD;  Location: [place] CV LAB;  Service: Cardiovascular;  Laterality: N/A;  . TEE WITHOUT  CARDIOVERSION  10/04/2011   Procedure: TRANSESOPHAGEAL ECHOCARDIOGRAM (TEE);  Surgeon: Candee Furbish, MD;  Location: [facility] ENDOSCOPY;  Service: Cardiovascular;  Laterality: N/A;  . TOTAL KNEE ARTHROPLASTY  2011   left  . TOTAL KNEE ARTHROPLASTY Right 02/24/2014   Procedure: RIGHT TOTAL KNEE ARTHROPLASTY;  Surgeon: Gearlean Alf, MD;  Location: [facility] ORS;  Service: Orthopedics;  Laterality: Right;   HPI:  Patient recently admitted with fatigue, shortness of breath weakness and chest discomfort.  Symptoms have been going on for about a week.  Chest CT scan to rule out PE showed bilateral pleural effusions right greater than left, no PE.  Pt underwent a CABG x3 on 03/27/20.  He has a hx of dysphagia with MBS on 10/01/2011.  Most recent recommendations for Dysphagia 3 solids and thin liquids with use of strategies. Pt has a hx of CVA and head/neck cancer.    Assessment / Plan / Recommendation Clinical Impression  Pt was seen for a bedside swallow evaluation in the setting of reported coughing with PO intake.  RN reported that pt had consistent coughing with consumption of thin liquid and with medications administered crushed in puree this AM.  Pt was seen with trials of ice chips, thin liquid, nectar-thick liquid, honey-thick liquid, and puree.  He consumed  1 tsp of each liquid and he exhibited consistent and persistent coughing immediately after each trial.  Cough was noted to be weak, likely partially attributable to recent CABG and chest pain while coughing.  Pt reported that he has had fairly consistent coughing with PO intake for "a while" prior to jaw surgery in April and recent CABG.  Pt would benefit from an instrumental swallow study to further evaluate swallow function given overt s/sx of aspiration with all consistencies.  Until instrumental swallow study is completed, recommend continuation of strict NPO with frequent oral care and consideration of short-term alternative means of nutrition.  Spoke with pt  and family friend (ex-wife) regarding all recommendations and they both verbalized understanding.  SLP Visit Diagnosis: Dysphagia, oropharyngeal phase (R13.12)    Aspiration Risk  Severe aspiration risk    Diet Recommendation NPO;Alternative means - temporary   Medication Administration: Via alternative means    Other  Recommendations Oral Care Recommendations: Oral care QID Other Recommendations: Remove water pitcher   Follow up Recommendations Other (comment)(TBD)      Frequency and Duration min 2x/week  2 weeks       Prognosis Prognosis for Safe Diet Advancement: Fair Barriers to Reach Goals: Severity of deficits      Swallow Study   General HPI: Patient recently admitted with fatigue, shortness of breath weakness and chest discomfort.  Symptoms have been going on for about a week.  Chest CT scan to rule out PE showed bilateral pleural effusions right greater than left, no PE.  Pt underwent a CABG x3 on 03/27/20.  He has a hx of dysphagia with MBS on 10/01/2011.  Most recent recommendations for Dysphagia 3 solids and thin liquids with use of strategies. Pt has a hx of CVA and head/neck cancer.  Type of Study: Bedside Swallow Evaluation Previous Swallow Assessment: See HPI Diet Prior to this Study: NPO Temperature Spikes Noted: No Respiratory Status: Nasal cannula History of Recent Intubation: Yes Length of Intubations (days): (less than 1 day for procedure) Date extubated: 03/27/20 Behavior/Cognition: Alert;Cooperative;Pleasant mood Oral Cavity Assessment: (significant L jaw swelling and blood noted on the posterior soft palate) Oral Care Completed by SLP: No Oral Cavity - Dentition: Missing dentition Patient Positioning: Upright in chair Baseline Vocal Quality: Hoarse Volitional Cough: Weak Volitional Swallow: Able to elicit    Oral/Motor/Sensory Function Overall Oral Motor/Sensory Function: Moderate impairment Facial ROM: Reduced right;Reduced left Facial Symmetry:  Abnormal symmetry left Lingual ROM: Reduced right;Reduced left Lingual Symmetry: Abnormal symmetry left Lingual Strength: Reduced   Ice Chips Ice chips: Impaired Presentation: Spoon Pharyngeal Phase Impairments: Cough - Immediate   Thin Liquid Thin Liquid: Impaired Presentation: Spoon Pharyngeal  Phase Impairments: Suspected delayed Swallow;Cough - Immediate    Nectar Thick Nectar Thick Liquid: Impaired Presentation: Spoon Pharyngeal Phase Impairments: Suspected delayed Swallow;Cough - Immediate   Honey Thick Honey Thick Liquid: Impaired Presentation: Spoon Pharyngeal Phase Impairments: Cough - Immediate   Puree Puree: Impaired Presentation: Spoon Pharyngeal Phase Impairments: Cough - Immediate   Solid     Solid: Not tested     Colin Mulders M.S., CCC-SLP Acute Rehabilitation Services Office: [phone]  Elvia Collum Jacquelina Hewins 03/28/2020,1:02 PM

## 2020-03-28 NOTE — Progress Notes (Signed)
1 Day Post-Op Procedure(s) (LRB): CORONARY ARTERY BYPASS GRAFTING (CABG) x Three, using left internal mammary artery and right leg greater saphenous vein harvested endoscopically (N/A) TRANSESOPHAGEAL ECHOCARDIOGRAM (TEE) (N/A) Subjective: Neuro intact with mild pain after CABG  Objective: Vital signs in last 24 hours: Temp:  [95.5 F (35.3 C)-98.4 F (36.9 C)] 98.1 F (36.7 C) (06/05 0700) Pulse Rate:  [81-90] 86 (06/05 0700) Cardiac Rhythm: Atrial paced (06/05 0400) Resp:  [10-24] 14 (06/05 0700) BP: (79-121)/(52-72) 106/65 (06/05 0700) SpO2:  [92 %-100 %] 97 % (06/05 0700) Arterial Line BP: (81-152)/(43-65) 138/47 (06/05 0700) FiO2 (%):  [40 %-50 %] 50 % (06/05 0400) Weight:  [87.9 kg] 87.9 kg (06/05 0500)  Hemodynamic parameters for last 24 hours: PAP: (21-41)/(11-29) 22/11 CO:  [3.7 L/min-5.8 L/min] 5.8 L/min CI:  [1.8 L/min/m2-2.9 L/min/m2] 2.9 L/min/m2  Intake/Output from previous day: 06/04 0701 - 06/05 0700 In: 6747.9 [P.O.:250; I.V.:3937.3; Blood:1330; IV Piggyback:1230.6] Out: 1062 [Urine:2260; Blood:1000; Chest Tube:515] Intake/Output this shift: No intake/output data recorded.       Exam    General- alert and comfortable    Neck- no JVD, no cervical adenopathy palpable, no carotid bruit   Lungs- clear without rales, wheezes   Cor- regular rate and rhythm, no murmur , gallop   Abdomen- soft, non-tender   Extremities - warm, non-tender, minimal edema   Neuro- oriented, appropriate, no focal weakness   Lab Results: Recent Labs    03/27/20 2026 03/27/20 2140 03/28/20 0456 03/28/20 0519  WBC 11.3*  --  12.9*  --   HGB 8.5*   < > 8.2* 8.5*  HCT 26.1*   < > 25.5* 25.0*  PLT 211  --  207  --    < > = values in this interval not displayed.   BMET:  Recent Labs    03/27/20 2026 03/27/20 2140 03/28/20 0456 03/28/20 0519  NA 133*   < > 132* 135  K 4.6   < > 4.0 4.1  CL 99  --  100  --   CO2 25  --  24  --   GLUCOSE 146*  --  156*  --   BUN 21  --   18  --   CREATININE 1.37*  --  1.34*  --   CALCIUM 8.7*  --  8.4*  --    < > = values in this interval not displayed.    PT/INR:  Recent Labs    03/27/20 1430  LABPROT 17.1*  INR 1.5*   ABG    Component Value Date/Time   PHART 7.398 03/28/2020 0519   HCO3 24.8 03/28/2020 0519   TCO2 26 03/28/2020 0519   ACIDBASEDEF 1.0 03/27/2020 2300   O2SAT 95.0 03/28/2020 0519   CBG (last 3)  Recent Labs    03/28/20 0514 03/28/20 0625 03/28/20 0737  GLUCAP 151* 146* 150*    Assessment/Plan: S/P Procedure(s) (LRB): CORONARY ARTERY BYPASS GRAFTING (CABG) x Three, using left internal mammary artery and right leg greater saphenous vein harvested endoscopically (N/A) TRANSESOPHAGEAL ECHOCARDIOGRAM (TEE) (N/A) Mobilize Diuresis Diabetes control See progression orders no anticoagulation dur to risk of bleeding from mouth Wean pressors  LOS: 7 days    Tharon Aquas Trigt III 03/28/2020

## 2020-03-28 NOTE — Progress Notes (Addendum)
CT surgery p.m. Rounds  Patient wih difficulty swallowing and risk of aspiration so placed n.p.o.  Will have swallow study and core track placed line hemodynamic stable maintaining sinus rhythm Up in chair for hours today Labs reviewed and are satisfactory

## 2020-03-28 NOTE — Progress Notes (Signed)
Progress Note  Patient Name: Connor [name] Date of Encounter: 03/28/2020  [facility] Cardiologist: Pixie Casino, MD   Subjective   Doing well this morning.  No significant chest pain or shortness of breath.  Inpatient Medications    Scheduled Meds: . acetaminophen  1,000 mg Oral Q6H   Or  . acetaminophen (TYLENOL) oral liquid 160 mg/5 mL  1,000 mg Per Tube Q6H  . aspirin EC  325 mg Oral Daily   Or  . aspirin  324 mg Per Tube Daily  . bisacodyl  10 mg Oral Daily   Or  . bisacodyl  10 mg Rectal Daily  . chlorhexidine gluconate (MEDLINE KIT)  15 mL Mouth Rinse BID  . Chlorhexidine Gluconate Cloth  6 each Topical Daily  . docusate sodium  200 mg Oral Daily  . enoxaparin (LOVENOX) injection  30 mg Subcutaneous QHS  . feeding supplement  1 Container Oral Q24H  . feeding supplement (ENSURE ENLIVE)  237 mL Oral BID BM  . furosemide  20 mg Intravenous BID  . insulin aspart  0-24 Units Subcutaneous Q4H  . lamoTRIgine  100 mg Oral q AM  . lamoTRIgine  200 mg Oral QPM  . mouth rinse  15 mL Mouth Rinse 10 times per day  . metoCLOPramide (REGLAN) injection  10 mg Intravenous Q6H  . [START ON 03/29/2020] metoprolol tartrate  12.5 mg Per Tube BID   Or  . [START ON 03/29/2020] metoprolol tartrate  12.5 mg Oral BID  . [START ON 03/29/2020] pantoprazole  40 mg Oral Daily  . rosuvastatin  20 mg Oral Daily  . sodium chloride flush  3 mL Intravenous Q12H   Continuous Infusions: . sodium chloride Stopped (03/27/20 2220)  . sodium chloride 20 mL/hr at 03/28/20 0700  . sodium chloride    . albumin human    . cefUROXime (ZINACEF)  IV Stopped (03/28/20 0525)  . insulin 1.3 mL/hr at 03/28/20 0700  . lactated ringers 20 mL/hr at 03/28/20 0542  . lactated ringers    . milrinone 0.375 mcg/kg/min (03/28/20 0700)  . nitroGLYCERIN Stopped (03/27/20 1508)  . norepinephrine (LEVOPHED) Adult infusion 4 mcg/min (03/28/20 0700)  . phenylephrine (NEO-SYNEPHRINE) Adult infusion 10 mcg/min  (03/28/20 0700)   PRN Meds: sodium chloride, albumin human, dextrose, fentaNYL (SUBLIMAZE) injection, metoprolol tartrate, midazolam, ondansetron (ZOFRAN) IV, oxyCODONE, sodium chloride flush, traMADol   Vital Signs    Vitals:   03/28/20 0400 03/28/20 0500 03/28/20 0600 03/28/20 0700  BP: 119/72 108/63 110/67 106/65  Pulse: 87 85 85 86  Resp: _0 Temp: 97.9 F (36.6 C) 97.9 F (36.6 C) 98.1 F (36.7 C) 98.1 F (36.7 C)  TempSrc: Core     SpO2: 96% 96% 94% 97%  Weight:  87.9 kg    Height:        Intake/Output Summary (Last 24 hours) at 03/28/2020 0831 Last data filed at 03/28/2020 0700 Gross per 24 hour  Intake 6747.86 ml  Output 3775 ml  Net 2972.86 ml   Last 3 Weights 03/28/2020 03/27/2020 03/26/2020  Weight (lbs) 193 lb 12.6 oz 186 lb 6.4 oz 187 lb 9.6 oz  Weight (kg) 87.9 kg 84.55 kg 85.095 kg      Telemetry    Atrial pacing- Personally Reviewed  ECG    03/28/2020-84 bpm sinus rhythm left bundle branch block incomplete.- Personally Reviewed  Physical Exam   GEN: No acute distress.  Laying in bed, fairly comfortable, Neck: No JVD, Swan-Ganz  catheter in neck Cardiac: RRR, no murmurs, rubs, or gallops.  Dressed scar noted Respiratory: Clear to auscultation bilaterally. GI: Soft, nontender, non-distended  MS: No edema; No deformity. Neuro:  Nonfocal  Psych: Normal affect   Labs    High Sensitivity Troponin:   Recent Labs  Lab 03/21/20 1636 03/21/20 1908 03/22/20 0100 03/22/20 0649  TROPONINIHS 616* 569* 613* 620*      Chemistry Recent Labs  Lab 03/22/20 0403 03/22/20 0403 03/23/20 0429 03/24/20 0342 03/27/20 0358 03/27/20 0800 03/27/20 1221 03/27/20 1429 03/27/20 2026 03/27/20 2140 03/27/20 2300 03/28/20 0456 03/28/20 0519  NA 133*   < > 135   < > 132*   < > 132*   < > 133*   < > 137 132* 135  K 4.4   < > 3.5   < > 4.4   < > 3.7   < > 4.6   < > 4.0 4.0 4.1  CL 94*   < > 92*   < > 91*   < > 94*  --  99  --   --  100  --   CO2 29   < > 32    < > 32  --   --   --  25  --   --  24  --   GLUCOSE 114*   < > 103*   < > 103*   < > 127*  --  146*  --   --  156*  --   BUN 19   < > 21   < > 27*   < > 23  --  21  --   --  18  --   CREATININE 1.17   < > 1.31*   < > 1.63*   < > 1.40*  --  1.37*  --   --  1.34*  --   CALCIUM 9.1   < > 9.2   < > 9.1  --   --   --  8.7*  --   --  8.4*  --   PROT 6.1*  --  6.2*  --   --   --   --   --   --   --   --  5.4*  --   ALBUMIN 3.2*  --  3.1*  --   --   --   --   --   --   --   --  3.2*  --   AST 19  --  17  --   --   --   --   --   --   --   --  36  --   ALT 12  --  11  --   --   --   --   --   --   --   --  14  --   ALKPHOS 78  --  75  --   --   --   --   --   --   --   --  51  --   BILITOT 0.6  --  0.9  --   --   --   --   --   --   --   --  1.2  --   GFRNONAA >60   < > 54*   < > 41*  --   --   --  51*  --   --  53*  --  GFRAA >60   < > >60   < > 48*  --   --   --  59*  --   --  >60  --   ANIONGAP 10   < > 11   < > 9  --   --   --  9  --   --  8  --    < > = values in this interval not displayed.     Hematology Recent Labs  Lab 03/27/20 1430 03/27/20 1944 03/27/20 2026 03/27/20 2140 03/27/20 2300 03/28/20 0456 03/28/20 0519  WBC 11.1*  --  11.3*  --   --  12.9*  --   RBC 2.83*  --  2.99*  --   --  2.88*  --   HGB 8.2*   < > 8.5*   < > 8.2* 8.2* 8.5*  HCT 24.8*   < > 26.1*   < > 24.0* 25.5* 25.0*  MCV 87.6  --  87.3  --   --  88.5  --   MCH 29.0  --  28.4  --   --  28.5  --   MCHC 33.1  --  32.6  --   --  32.2  --   RDW 14.4  --  14.3  --   --  14.6  --   PLT 187  --  211  --   --  207  --    < > = values in this interval not displayed.    BNP Recent Labs  Lab 03/21/20 1906  BNP 3,210.4*     DDimer No results for input(s): DDIMER in the last 168 hours.   Radiology    DG Chest Port 1 View  Result Date: 03/27/2020 CLINICAL DATA:  Post CABG EXAM: PORTABLE CHEST 1 VIEW COMPARISON:  03/26/2020 FINDINGS: Endotracheal tube is 4 cm above the carina. Bilateral chest tubes in place.  No pneumothorax. NG tube tip is in the proximal stomach just beyond the GE junction. Swan-Ganz catheter in the pulmonary outflow tract. Mild vascular congestion and bibasilar atelectasis. IMPRESSION: Postoperative changes.  Bilateral chest tubes without pneumothorax. Support devices as above. Vascular congestion and bibasilar atelectasis. Electronically Signed   By: Rolm Baptise M.D.   On: 03/27/2020 16:11   VAS US DOPPLER PRE CABG  Result Date: 03/26/2020 PREOPERATIVE VASCULAR EVALUATION  Indications:      Pre-CABG. Risk Factors:     Hypertension, hyperlipidemia. Comparison Study: 10/03/2011- carotid artery duplex Performing Technologist: Maudry Mayhew MHA, RVT, RDCS, RDMS  Examination Guidelines: A complete evaluation includes B-mode imaging, spectral Doppler, color Doppler, and power Doppler as needed of all accessible portions of each vessel. Bilateral testing is considered an integral part of a complete examination. Limited examinations for reoccurring indications may be performed as noted.  Right Carotid Findings: +----------+--------+--------+--------+-----------------------+--------+           PSV cm/sEDV cm/sStenosisDescribe               Comments +----------+--------+--------+--------+-----------------------+--------+ CCA Prox  66      17              smooth and heterogenous         +----------+--------+--------+--------+-----------------------+--------+ CCA Distal56      18                                              +----------+--------+--------+--------+-----------------------+--------+  ICA Prox  69      21              smooth and heterogenous         +----------+--------+--------+--------+-----------------------+--------+ ICA Distal85      25                                              +----------+--------+--------+--------+-----------------------+--------+ ECA       56      7                                                +----------+--------+--------+--------+-----------------------+--------+ Portions of this table do not appear on this page. +----------+--------+-------+----------------+------------+           PSV cm/sEDV cmsDescribe        Arm Pressure +----------+--------+-------+----------------+------------+ [id number]             Multiphasic, WNL             +----------+--------+-------+----------------+------------+ +---------+--------+--+--------+--+---------+ VertebralPSV cm/s46EDV cm/s13Antegrade +---------+--------+--+--------+--+---------+ Left Carotid Findings: +----------+--------+--------+--------+-----------------------+--------+           PSV cm/sEDV cm/sStenosisDescribe               Comments +----------+--------+--------+--------+-----------------------+--------+ CCA Prox  68      18                                              +----------+--------+--------+--------+-----------------------+--------+ CCA Distal74      25              smooth and heterogenous         +----------+--------+--------+--------+-----------------------+--------+ ICA Prox  90      33              calcific                        +----------+--------+--------+--------+-----------------------+--------+ ICA Distal98      32                                              +----------+--------+--------+--------+-----------------------+--------+ ECA       48      15              smooth and heterogenous         +----------+--------+--------+--------+-----------------------+--------+ +----------+--------+--------+--------+------------+ SubclavianPSV cm/sEDV cm/sDescribeArm Pressure +----------+--------+--------+--------+------------+           86                                   +----------+--------+--------+--------+------------+ +---------+--------+--+--------+--+ VertebralPSV cm/s60EDV cm/s21 +---------+--------+--+--------+--+  ABI Findings:  +--------+------------------+-----+---------+--------+ Right   Rt Pressure (mmHg)IndexWaveform Comment  +--------+------------------+-----+---------+--------+ [id number]                    triphasic         +--------+------------------+-----+---------+--------+ PTA  triphasic         +--------+------------------+-----+---------+--------+ DP                             triphasic         +--------+------------------+-----+---------+--------+ +--------+------------------+-----+---------+-------+ Left    Lt Pressure (mmHg)IndexWaveform Comment +--------+------------------+-----+---------+-------+ [id number]                     triphasic        +--------+------------------+-----+---------+-------+ PTA                            triphasic        +--------+------------------+-----+---------+-------+ DP                             triphasic        +--------+------------------+-----+---------+-------+  Right Doppler Findings: +--------+--------+-----+---------+--------+ Site    PressureIndexDoppler  Comments +--------+--------+-----+---------+--------+ [id number]          triphasic         +--------+--------+-----+---------+--------+ Radial               triphasic         +--------+--------+-----+---------+--------+ Ulnar                triphasic         +--------+--------+-----+---------+--------+  Left Doppler Findings: +--------+--------+-----+---------+--------+ Site    PressureIndexDoppler  Comments +--------+--------+-----+---------+--------+ [id number]           triphasic         +--------+--------+-----+---------+--------+ Radial               triphasic         +--------+--------+-----+---------+--------+ Ulnar                triphasic         +--------+--------+-----+---------+--------+  Summary: Right Carotid: Velocities in the right ICA are consistent with a 1-39% stenosis. Left Carotid:  Velocities in the left ICA are consistent with a 1-39% stenosis.               There is evidence by color Doppler and spectral Doppler of an area               of reversed flow in the left ICA, as well as arterialized flow in               the left IJV, with evidence of possible communication of the two               vessels. 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 Vertebrals:  Bilateral vertebral arteries demonstrate antegrade flow. Subclavians: Normal flow hemodynamics were seen in bilateral subclavian              arteries. Right Upper Extremity: Doppler waveform obliterate with right radial compression. Doppler waveforms remain within normal limits with right ulnar compression. Left Upper Extremity: Doppler waveforms remain within normal limits with left radial compression. Doppler waveforms decrease >50% with left ulnar compression.  Electronically signed by Monica Martinez MD on 03/26/2020 at 4:13:57 PM.    Final     Cardiac Studies   Echo-EF 57 to 30%   Assessment & Plan    74 year old with severe coronary artery disease status post bypass grafting x3 left internal mammary artery to left anterior descending, SVG to OM, SVG to PDA in the setting of acute  on chronic systolic heart failure secondary to ischemic cardiomyopathy with EF of 20 to 25% with class IV symptoms.  Severe coronary artery disease -CABG as described above. -Continue with aggressive secondary risk factor prevention.  Ischemic cardiomyopathy with acute systolic heart failure -Blood pressure at this point would not support Entresto.  Continue to monitor for improvements.  When able we will try to initiate angiotensin receptor blocker for instance.  Beta-blocker when able also.  Obviously right now offering hemodynamic support postop. -Closely monitoring fluid balance.  IV Lasix.  Prostate cancer -Possible metastasis to the spine, patient was not previously aware of this.  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  Essential hypertension -Has had issues with orthostatic hypotension in the past.  Watching blood pressure.  Hyperlipidemia -I will change to high intensity statin therapy, previously on simvastatin 20.  I will start Crestor 20 mg.       For questions or updates, please contact [place] Please consult [email or web address] for contact info under        Signed, Candee Furbish, MD  03/28/2020, 8:31 AM

## 2020-03-28 NOTE — Progress Notes (Signed)
Re:  TOC..  Pt does not want to go to any facilities in [place].  He prefers to rehabilitate in [place].

## 2020-03-29 ENCOUNTER — Inpatient Hospital Stay: Payer: Self-pay

## 2020-03-29 ENCOUNTER — Inpatient Hospital Stay (HOSPITAL_COMMUNITY): Payer: PPO

## 2020-03-29 LAB — CBC
HCT: 23.7 % — ABNORMAL LOW (ref 39.0–52.0)
HCT: 25.5 % — ABNORMAL LOW (ref 39.0–52.0)
Hemoglobin: 7.3 g/dL — ABNORMAL LOW (ref 13.0–17.0)
Hemoglobin: 7.9 g/dL — ABNORMAL LOW (ref 13.0–17.0)
MCH: 27.5 pg (ref 26.0–34.0)
MCH: 27.4 pg (ref 26.0–34.0)
MCHC: 30.8 g/dL (ref 30.0–36.0)
MCHC: 31 g/dL (ref 30.0–36.0)
MCV: 89.4 fL (ref 80.0–100.0)
MCV: 88.5 fL (ref 80.0–100.0)
Platelets: 169 10*3/uL (ref 150–400)
Platelets: 161 10*3/uL (ref 150–400)
RBC: 2.65 MIL/uL — ABNORMAL LOW (ref 4.22–5.81)
RBC: 2.88 MIL/uL — ABNORMAL LOW (ref 4.22–5.81)
RDW: 14.9 % (ref 11.5–15.5)
RDW: 14.7 % (ref 11.5–15.5)
WBC: 10.9 10*3/uL — ABNORMAL HIGH (ref 4.0–10.5)
WBC: 10 10*3/uL (ref 4.0–10.5)
nRBC: 0 % (ref 0.0–0.2)
nRBC: 0 % (ref 0.0–0.2)

## 2020-03-29 LAB — BASIC METABOLIC PANEL
Anion gap: 12 (ref 5–15)
BUN: 18 mg/dL (ref 8–23)
CO2: 25 mmol/L (ref 22–32)
Calcium: 8.7 mg/dL — ABNORMAL LOW (ref 8.9–10.3)
Chloride: 98 mmol/L (ref 98–111)
Creatinine, Ser: 1.22 mg/dL (ref 0.61–1.24)
GFR calc Af Amer: >60 mL/min (ref >60)
GFR calc non Af Amer: 59 mL/min — ABNORMAL LOW (ref >60)
Glucose, Bld: 137 mg/dL — ABNORMAL HIGH (ref 70–99)
Potassium: 3.8 mmol/L (ref 3.5–5.1)
Sodium: 135 mmol/L (ref 135–145)

## 2020-03-29 LAB — GLUCOSE, CAPILLARY
Glucose-Capillary: 117 mg/dL — ABNORMAL HIGH (ref 70–99)
Glucose-Capillary: 118 mg/dL — ABNORMAL HIGH (ref 70–99)
Glucose-Capillary: 120 mg/dL — ABNORMAL HIGH (ref 70–99)
Glucose-Capillary: 136 mg/dL — ABNORMAL HIGH (ref 70–99)
Glucose-Capillary: 141 mg/dL — ABNORMAL HIGH (ref 70–99)
Glucose-Capillary: 143 mg/dL — ABNORMAL HIGH (ref 70–99)
Glucose-Capillary: 144 mg/dL — ABNORMAL HIGH (ref 70–99)
Glucose-Capillary: 144 mg/dL — ABNORMAL HIGH (ref 70–99)

## 2020-03-29 LAB — COOXEMETRY PANEL
Carboxyhemoglobin: 2 % — ABNORMAL HIGH (ref 0.5–1.5)
Methemoglobin: 1.5 % (ref 0.0–1.5)
O2 Saturation: 67.5 %
Total hemoglobin: 7.7 g/dL — ABNORMAL LOW (ref 12.0–16.0)

## 2020-03-29 LAB — PREPARE RBC (CROSSMATCH)

## 2020-03-29 MED ORDER — POTASSIUM CHLORIDE 10 MEQ/50ML IV SOLN
10.0000 meq | INTRAVENOUS | Status: AC
  Administered 2020-03-29 (×2): 10 meq via INTRAVENOUS
  Filled 2020-03-29 (×2): qty 50

## 2020-03-29 NOTE — Progress Notes (Signed)
Spoke with April RN re PICC order.  Dr Prescott Gum stated ok to place tomorrow if unable to place today.  April RN agreed.  Will notify via securechat if able to place PICC this shift.

## 2020-03-29 NOTE — Progress Notes (Signed)
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 Milrinone being slowly weaned and following mixed venous saturation Maintaining sinus rhythm Postop chest x-ray today clear  Objective: Vital signs in last 24 hours: Temp:  [97.7 F (36.5 C)-98.4 F (36.9 C)] 98.4 F (36.9 C) (06/06 0741) Pulse Rate:  [86-103] 103 (06/06 0900) Cardiac Rhythm: Normal sinus rhythm (06/06 0800) Resp:  [10-21] 20 (06/06 0900) BP: (94-124)/(50-76) 120/66 (06/06 0800) SpO2:  [94 %-100 %] 97 % (06/06 0900) Arterial Line BP: (93-158)/(29-59) 158/58 (06/06 0900) FiO2 (%):  [50 %] 50 % (06/06 0400) Weight:  [88.8 kg] 88.8 kg (06/06 0500)  Hemodynamic parameters for last 24 hours:    Intake/Output from previous day: 06/05 0701 - 06/06 0700 In: 937.8 [I.V.:657.6; IV Piggyback:280.2] Out: 5945 [OPFYT:2446; Chest Tube:480] Intake/Output this shift: Total I/O In: 23.4 [I.V.:23.4] Out: 305 [Urine:225; Chest Tube:80]       Exam    General- alert and comfortable    Neck- no JVD, no cervical adenopathy palpable, no carotid bruit   Lungs- clear without rales, wheezes   Cor- regular rate and rhythm, no murmur , gallop   Abdomen- soft, non-tender   Extremities - warm, non-tender, minimal edema   Neuro- oriented, appropriate, no focal weakness   Lab Results: Recent Labs    03/28/20 1623 03/29/20 0406  WBC 13.6* 10.9*  HGB 8.0* 7.3*  HCT 25.3* 23.7*  PLT 185 169   BMET:  Recent Labs    03/28/20 1623 03/29/20 0406   NA 134* 135  K 3.8 3.8  CL 98 98  CO2 24 25  GLUCOSE 150* 137*  BUN 17 18  CREATININE 1.21 1.22  CALCIUM 8.6* 8.7*    PT/INR:  Recent Labs    03/27/20 1430  LABPROT 17.1*  INR 1.5*   ABG    Component Value Date/Time   PHART 7.398 03/28/2020 0519   HCO3 24.8 03/28/2020 0519   TCO2 26 03/28/2020 0519   ACIDBASEDEF 1.0 03/27/2020 2300   O2SAT 67.5 03/29/2020 0406   CBG (last 3)  Recent Labs    03/29/20 0337 03/29/20 0404 03/29/20 0738  GLUCAP 144* 136* 143*    Assessment/Plan: S/P Procedure(s) (LRB): CORONARY ARTERY BYPASS GRAFTING (CABG) x Three, using left internal mammary artery and right leg greater saphenous vein harvested endoscopically (N/A) TRANSESOPHAGEAL ECHOCARDIOGRAM (TEE) (N/A) Hold anticoagulation because of severe mouth bleeding in the past Start tube feeds after core track placed N.p.o. until speech path recommends diet Physical therapy will be needed  milrinone reduced to 0.25 for LV dysfunction   LOS: 8 days    Connor [name] 03/29/2020

## 2020-03-29 NOTE — Progress Notes (Signed)
Modified Barium Swallow Progress Note  Patient Details  Name: Connor [name] MRN: [id number] Date of Birth: Jul 18, 1946  Today's Date: 03/29/2020  Modified Barium Swallow completed.  Full report located under Chart Review in the Imaging Section.  Brief recommendations include the following:  Clinical Impression  Pt was seen for a modified barium swallow study and he presents with moderately-severe oropharyngeal dysphagia with resultant aspiration of thin liquid and nectar-thick liquid, and deep laryngeal penetration of honey-thick liquid and puree.  Pt demonstrated fairly good airway protection before and during the swallow; however, he had mild-severe pharyngeal residue which resulted in aspiration after the swallow.  Aspiration was sensed and pt was able to intermittently clear trace amounts of aspirated materials with a spontaneous cough.  Of note, pt only consumed a tsp of each trial on this examination except for thin liquid via straw (with chin tuck), which resulted in moderate aspiration.  Suspect that laryngeal residue and aspiration would have increased with cup or straw sips of nectar-thick liquid and honey-thick liquid.  Pharyngeal residue was noted to increase as trials increased in viscosity, with severe residue observed with the puree trial.    Oral phase was remarkable for reduced lingual control resulting in premature spillage to the pyriform sinus and reduced lingual strength resulting in oral residue.  Pharyngeal phase was remarkable for reduced BOT retraction resulting in vallecular residue, reduced hyolaryngeal elevation/excursion resulting in vallecular and pyriform residue, and reduced pharyngeal constriction resulting in posterior pharyngeal wall residue.  Suspect reduced UES duration of opening secondary to pharyngeal weakness, but it may also be attributable to esophageal dysfunction.    Pt is at risk for aspiration with all consistencies secondary to pharyngeal weakness  and residue.  Recommend continuation of NPO at this time with short-term alternative means of nutrition and frequent oral care.  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  Consideration of long-term alternative means of nutrition may be warranted if dysphagia does not improve.  Spoke with pt regarding recommendations and he stated that he would like to consume water if possible.  Re-iterated risk of aspiration, and discussed the possibility of him having 2-5 tsp of water each day following thorough oral care and given full RN supervision, with known risk of aspiration.  Pt verbalized risk of aspiration and stated that he would like to consume small amounts of water each day.  Spoke with RN regarding all recommendations and pt's request.  SLP will f/u per POC.     Swallow Evaluation Recommendations  SLP Diet Recommendations: NPO;Alternative means - temporary;Ice chips PRN after oral care   Medication Administration: Via alternative means   Oral Care Recommendations: Oral care QID;Staff/trained caregiver to provide oral care   Other Recommendations: Remove water pitcher   Colin Mulders M.S., CCC-SLP Acute Rehabilitation Services Office: [phone]  [place] 03/29/2020,2:53 PM

## 2020-03-30 ENCOUNTER — Encounter: Payer: Self-pay | Admitting: *Deleted

## 2020-03-30 ENCOUNTER — Inpatient Hospital Stay (HOSPITAL_COMMUNITY): Payer: PPO

## 2020-03-30 LAB — TYPE AND SCREEN
ABO/RH(D): O POS
Antibody Screen: NEGATIVE
Unit division: 0
Unit division: 0
Unit division: 0
Unit division: 0
Unit division: 0
Unit division: 0
Unit division: 0
Unit division: 0

## 2020-03-30 LAB — BPAM RBC
Blood Product Expiration Date: 202107062359
Blood Product Expiration Date: 202107062359
Blood Product Expiration Date: 202107062359
Blood Product Expiration Date: 202107062359
Blood Product Expiration Date: 202107092359
Blood Product Expiration Date: 202107092359
Blood Product Expiration Date: 202107092359
Blood Product Expiration Date: 202107092359
ISSUE DATE / TIME: 202106040926
ISSUE DATE / TIME: 202106040926
ISSUE DATE / TIME: 202106040926
ISSUE DATE / TIME: 202106040926
ISSUE DATE / TIME: 202106061120
ISSUE DATE / TIME: 202106070441
ISSUE DATE / TIME: 202106070733
ISSUE DATE / TIME: 202106071014
Unit Type and Rh: 5100
Unit Type and Rh: 5100
Unit Type and Rh: 5100
Unit Type and Rh: 5100
Unit Type and Rh: 5100
Unit Type and Rh: 5100
Unit Type and Rh: 5100
Unit Type and Rh: 5100

## 2020-03-30 LAB — CBC
HCT: 24.6 % — ABNORMAL LOW (ref 39.0–52.0)
Hemoglobin: 8 g/dL — ABNORMAL LOW (ref 13.0–17.0)
MCH: 28.4 pg (ref 26.0–34.0)
MCHC: 32.5 g/dL (ref 30.0–36.0)
MCV: 87.2 fL (ref 80.0–100.0)
Platelets: 161 10*3/uL (ref 150–400)
RBC: 2.82 MIL/uL — ABNORMAL LOW (ref 4.22–5.81)
RDW: 14.6 % (ref 11.5–15.5)
WBC: 9.6 10*3/uL (ref 4.0–10.5)
nRBC: 0 % (ref 0.0–0.2)

## 2020-03-30 LAB — GLUCOSE, CAPILLARY
Glucose-Capillary: 115 mg/dL — ABNORMAL HIGH (ref 70–99)
Glucose-Capillary: 101 mg/dL — ABNORMAL HIGH (ref 70–99)
Glucose-Capillary: 112 mg/dL — ABNORMAL HIGH (ref 70–99)
Glucose-Capillary: 123 mg/dL — ABNORMAL HIGH (ref 70–99)
Glucose-Capillary: 119 mg/dL — ABNORMAL HIGH (ref 70–99)

## 2020-03-30 LAB — BASIC METABOLIC PANEL
Anion gap: 11 (ref 5–15)
BUN: 17 mg/dL (ref 8–23)
CO2: 26 mmol/L (ref 22–32)
Calcium: 8.6 mg/dL — ABNORMAL LOW (ref 8.9–10.3)
Chloride: 98 mmol/L (ref 98–111)
Creatinine, Ser: 0.98 mg/dL (ref 0.61–1.24)
GFR calc Af Amer: >60 mL/min (ref >60)
GFR calc non Af Amer: >60 mL/min (ref >60)
Glucose, Bld: 112 mg/dL — ABNORMAL HIGH (ref 70–99)
Potassium: 3.2 mmol/L — ABNORMAL LOW (ref 3.5–5.1)
Sodium: 135 mmol/L (ref 135–145)

## 2020-03-30 LAB — RENAL FUNCTION PANEL
Albumin: 2.7 g/dL — ABNORMAL LOW (ref 3.5–5.0)
Anion gap: 9 (ref 5–15)
BUN: 20 mg/dL (ref 8–23)
CO2: 27 mmol/L (ref 22–32)
Calcium: 9 mg/dL (ref 8.9–10.3)
Chloride: 99 mmol/L (ref 98–111)
Creatinine, Ser: 1.02 mg/dL (ref 0.61–1.24)
GFR calc Af Amer: >60 mL/min (ref >60)
GFR calc non Af Amer: >60 mL/min (ref >60)
Glucose, Bld: 123 mg/dL — ABNORMAL HIGH (ref 70–99)
Phosphorus: 2.6 mg/dL (ref 2.5–4.6)
Potassium: 3.3 mmol/L — ABNORMAL LOW (ref 3.5–5.1)
Sodium: 135 mmol/L (ref 135–145)

## 2020-03-30 LAB — COOXEMETRY PANEL
Carboxyhemoglobin: 2.1 % — ABNORMAL HIGH (ref 0.5–1.5)
Methemoglobin: 1.3 % (ref 0.0–1.5)
O2 Saturation: 70.6 %
Total hemoglobin: 8.7 g/dL — ABNORMAL LOW (ref 12.0–16.0)

## 2020-03-30 MED ORDER — PRO-STAT SUGAR FREE PO LIQD
30.0000 mL | Freq: Two times a day (BID) | ORAL | Status: DC
  Administered 2020-03-30 – 2020-04-01 (×4): 30 mL
  Filled 2020-03-30 (×4): qty 30

## 2020-03-30 MED ORDER — CARVEDILOL 6.25 MG PO TABS
6.2500 mg | ORAL_TABLET | Freq: Two times a day (BID) | ORAL | Status: DC
  Administered 2020-03-30 – 2020-04-04 (×8): 6.25 mg via ORAL
  Filled 2020-03-30 (×13): qty 1

## 2020-03-30 MED ORDER — POTASSIUM CHLORIDE 10 MEQ/50ML IV SOLN
10.0000 meq | INTRAVENOUS | Status: AC
  Administered 2020-03-30 (×2): 10 meq via INTRAVENOUS

## 2020-03-30 MED ORDER — POTASSIUM CHLORIDE CRYS ER 20 MEQ PO TBCR: 40.0000 meq | EXTENDED_RELEASE_TABLET | Freq: Two times a day (BID) | ORAL | Status: DC

## 2020-03-30 MED ORDER — VITAL 1.5 CAL PO LIQD
1000.0000 mL | ORAL | Status: DC
  Administered 2020-03-30 (×2): 1000 mL
  Filled 2020-03-30 (×5): qty 1000

## 2020-03-30 MED ORDER — SODIUM CHLORIDE 0.9% FLUSH
10.0000 mL | Freq: Two times a day (BID) | INTRAVENOUS | Status: DC
  Administered 2020-03-30 – 2020-04-08 (×18): 10 mL

## 2020-03-30 MED ORDER — POTASSIUM CHLORIDE 10 MEQ/50ML IV SOLN
INTRAVENOUS | Status: AC
  Filled 2020-03-30: qty 150

## 2020-03-30 MED ORDER — MILRINONE LACTATE IN DEXTROSE 20-5 MG/100ML-% IV SOLN
0.1250 ug/kg/min | INTRAVENOUS | Status: DC
  Administered 2020-03-30: 0.125 ug/kg/min via INTRAVENOUS
  Filled 2020-03-30: qty 100

## 2020-03-30 MED ORDER — SODIUM CHLORIDE 0.9% FLUSH
10.0000 mL | INTRAVENOUS | Status: DC | PRN
  Administered 2020-04-03 (×2): 10 mL

## 2020-03-30 MED ORDER — POTASSIUM CHLORIDE 10 MEQ/50ML IV SOLN
10.0000 meq | INTRAVENOUS | Status: AC
  Administered 2020-03-30 (×3): 10 meq via INTRAVENOUS
  Filled 2020-03-30 (×3): qty 50

## 2020-03-30 MED FILL — Magnesium Sulfate Inj 50%: INTRAMUSCULAR | Qty: 10 | Status: AC

## 2020-03-30 MED FILL — Heparin Sodium (Porcine) Inj 1000 Unit/ML: INTRAMUSCULAR | Qty: 30 | Status: AC

## 2020-03-30 MED FILL — Potassium Chloride Inj 2 mEq/ML: INTRAVENOUS | Qty: 40 | Status: AC

## 2020-03-30 NOTE — Procedures (Signed)
Cortrak  Person Inserting Tube:  Jacklynn Barnacle E, RD Tube Type:  Cortrak - 43 inches Tube Location:  Right nare Initial Placement:  Stomach Secured by: Bridle Technique Used to Measure Tube Placement:  Documented cm marking at nare/ corner of mouth Cortrak Secured At:  60 cm    Cortrak Tube Team Note:  Consult received to place a Cortrak feeding tube.   X-ray is required, abdominal x-ray has been ordered by the Cortrak team. Please confirm tube placement before using the Cortrak tube.   If the tube becomes dislodged please keep the tube and contact the Cortrak team at [email or web address] ([email or web address]) for replacement.  If after hours and replacement cannot be delayed, place a NG tube and confirm placement with an abdominal x-ray.   Jacklynn Barnacle, MS, RD, LDN Pager number available on Amion

## 2020-03-30 NOTE — Progress Notes (Signed)
PT Cancellation Note  Patient Details Name: Connor [name] MRN: [id number] DOB: 01-16-1946   Cancelled Treatment:    Reason Eval/Treat Not Completed: Patient at procedure or test/unavailable Pt currently getting PICC line placed. Will follow.   Marguarite Arbour A Akeem Heppler 03/30/2020, 9:19 AM Marisa Severin, PT, DPT Acute Rehabilitation Services Pager [phone] Office [phone]

## 2020-03-30 NOTE — Progress Notes (Signed)
[facility] BRIEF SICU PROGRESS NOTE  3 Days Post-Op  S/P Procedure(s) (LRB): CORONARY ARTERY BYPASS GRAFTING (CABG) x Three, using left internal mammary artery and right leg greater saphenous vein harvested endoscopically (N/A) TRANSESOPHAGEAL ECHOCARDIOGRAM (TEE) (N/A)   Stable day.  Ambulating in hall NSR w/ stable BP Breathing comfortably on room air  Plan: Continue current plan  Rexene Alberts, MD 03/30/2020 5:32 PM

## 2020-03-30 NOTE — Progress Notes (Signed)
Peripherally Inserted Central Catheter Placement  The IV Nurse has discussed with the patient and/or persons authorized to consent for the patient, the purpose of this procedure and the potential benefits and risks involved with this procedure.  The benefits include less needle sticks, lab draws from the catheter, and the patient may be discharged home with the catheter. Risks include, but not limited to, infection, bleeding, blood clot (thrombus formation), and puncture of an artery; nerve damage and irregular heartbeat and possibility to perform a PICC exchange if needed/ordered by physician.  Alternatives to this procedure were also discussed.  Bard Power PICC patient education guide, fact sheet on infection prevention and patient information card has been provided to patient /or left at bedside.    PICC Placement Documentation  PICC Double Lumen 78/97/84 PICC Right Basilic 41 cm 0 cm (Active)  Exposed Catheter (cm) 0 cm 03/30/20 0915  Site Assessment Clean;Dry;Intact 03/30/20 0915  Lumen #1 Status Flushed;Saline locked;Blood return noted 03/30/20 0915  Lumen #2 Status Flushed;Blood return noted;Saline locked 03/30/20 0915  Dressing Type Transparent;Securing device 03/30/20 0915       Frances Maywood 03/30/2020, 9:18 AM

## 2020-03-30 NOTE — Progress Notes (Signed)
3 Days Post-Op Procedure(s) (LRB): CORONARY ARTERY BYPASS GRAFTING (CABG) x Three, using left internal mammary artery and right leg greater saphenous vein harvested endoscopically (N/A) TRANSESOPHAGEAL ECHOCARDIOGRAM (TEE) (N/A) Subjective: CABG for ischemic cardiomyopathy Chronic swallow dysfx - now npo waiting for Cortrak Weaning Milrinone, following coox Chronic anemia Objective: Vital signs in last 24 hours: Temp:  [97.9 F (36.6 C)-98.4 F (36.9 C)] 98.1 F (36.7 C) (06/07 0400) Pulse Rate:  [93-107] 94 (06/07 0700) Cardiac Rhythm: Normal sinus rhythm (06/07 0400) Resp:  [11-20] 16 (06/07 0700) BP: (75-145)/(58-81) 106/62 (06/07 0700) SpO2:  [91 %-100 %] 98 % (06/07 0700) Arterial Line BP: (135-158)/(53-58) 158/58 (06/06 0900) Weight:  [87.4 kg] 87.4 kg (06/07 0500)  Hemodynamic parameters for last 24 hours:  stable  Intake/Output from previous day: 06/06 0701 - 06/07 0700 In: 999.5 [I.V.:169.4; Blood:630; IV Piggyback:200.2] Out: 2273 [Urine:2128; Chest Tube:145] Intake/Output this shift: No intake/output data recorded.      Exam    General- alert and comfortable    Neck- no JVD, no cervical adenopathy palpable, no carotid bruit   Lungs- clear without rales, wheezes   Cor- regular rate and rhythm, no murmur , gallop   Abdomen- soft, non-tender   Extremities - warm, non-tender, minimal edema   Neuro- oriented, appropriate, no focal weakness    Lab Results: Recent Labs    03/29/20 1615 03/30/20 0327  WBC 10.0 9.6  HGB 7.9* 8.0*  HCT 25.5* 24.6*  PLT 161 161   BMET:  Recent Labs    03/29/20 0406 03/30/20 0327  NA 135 135  K 3.8 3.2*  CL 98 98  CO2 25 26  GLUCOSE 137* 112*  BUN 18 17  CREATININE 1.22 0.98  CALCIUM 8.7* 8.6*    PT/INR:  Recent Labs    03/27/20 1430  LABPROT 17.1*  INR 1.5*   ABG    Component Value Date/Time   PHART 7.398 03/28/2020 0519   HCO3 24.8 03/28/2020 0519   TCO2 26 03/28/2020 0519   ACIDBASEDEF 1.0 03/27/2020  2300   O2SAT 70.6 03/30/2020 0327   CBG (last 3)  Recent Labs    03/29/20 2017 03/29/20 2336 03/30/20 0332  GLUCAP 117* 118* 115*    Assessment/Plan: S/P Procedure(s) (LRB): CORONARY ARTERY BYPASS GRAFTING (CABG) x Three, using left internal mammary artery and right leg greater saphenous vein harvested endoscopically (N/A) TRANSESOPHAGEAL ECHOCARDIOGRAM (TEE) (N/A) Mobilize Diuresis place feeding tube Keep in ICU today  LOS: 9 days    Connor [name] 03/30/2020

## 2020-03-30 NOTE — Progress Notes (Signed)
TCTS DAILY ICU PROGRESS NOTE                   [address]            [place] [address]          [phone]   3 Days Post-Op Procedure(s) (LRB): CORONARY ARTERY BYPASS GRAFTING (CABG) x Three, using left internal mammary artery and right leg greater saphenous vein harvested endoscopically (N/A) TRANSESOPHAGEAL ECHOCARDIOGRAM (TEE) (N/A)  Total Length of Stay:  LOS: 9 days   Subjective: Feels okay. He thinks he would be able to swallow liquids today and possible ensure.   Objective: Vital signs in last 24 hours: Temp:  [97.9 F (36.6 C)-98.4 F (36.9 C)] 98.1 F (36.7 C) (06/07 0400) Pulse Rate:  [93-107] 94 (06/07 0700) Cardiac Rhythm: Normal sinus rhythm (06/07 0400) Resp:  [11-20] 16 (06/07 0700) BP: (75-145)/(58-81) 106/62 (06/07 0700) SpO2:  [91 %-100 %] 98 % (06/07 0700) Arterial Line BP: (135-158)/(53-58) 158/58 (06/06 0900) Weight:  [87.4 kg] 87.4 kg (06/07 0500)  Filed Weights   03/28/20 0500 03/29/20 0500 03/30/20 0500  Weight: 87.9 kg 88.8 kg 87.4 kg    Weight change: -1.4 kg   Hemodynamic parameters for last 24 hours:    Intake/Output from previous day: 06/06 0701 - 06/07 0700 In: 999.5 [I.V.:169.4; Blood:630; IV Piggyback:200.2] Out: 2273 [Urine:2128; Chest Tube:145]  Intake/Output this shift: No intake/output data recorded.  Current Meds: Scheduled Meds: . acetaminophen  1,000 mg Oral Q6H   Or  . acetaminophen (TYLENOL) oral liquid 160 mg/5 mL  1,000 mg Per Tube Q6H  . aspirin EC  81 mg Oral Daily  . bisacodyl  10 mg Oral Daily   Or  . bisacodyl  10 mg Rectal Daily  . chlorhexidine gluconate (MEDLINE KIT)  15 mL Mouth Rinse BID  . Chlorhexidine Gluconate Cloth  6 each Topical Daily  . docusate  200 mg Oral Daily  . furosemide  20 mg Intravenous BID  . insulin aspart  0-24 Units Subcutaneous Q4H  . lamoTRIgine  100 mg Oral q AM  . lamoTRIgine  200 mg Oral QPM  . mouth rinse  15 mL Mouth Rinse 10 times per day  .  metoCLOPramide (REGLAN) injection  10 mg Intravenous Q6H  . metoprolol tartrate  12.5 mg Per Tube BID   Or  . metoprolol tartrate  12.5 mg Oral BID  . pantoprazole  40 mg Oral Daily  . rosuvastatin  20 mg Oral Daily  . sodium chloride flush  3 mL Intravenous Q12H   Continuous Infusions: . sodium chloride Stopped (03/29/20 1634)  . sodium chloride Stopped (03/28/20 1740)  . sodium chloride    . lactated ringers 20 mL/hr at 03/28/20 0542  . levofloxacin (LEVAQUIN) IV Stopped (03/29/20 1636)  . milrinone 0.25 mcg/kg/min (03/30/20 0700)  . nitroGLYCERIN Stopped (03/27/20 1508)  . phenylephrine (NEO-SYNEPHRINE) Adult infusion Stopped (03/29/20 0852)   PRN Meds:.sodium chloride, fentaNYL (SUBLIMAZE) injection, metoprolol tartrate, midazolam, ondansetron (ZOFRAN) IV, oxyCODONE, sodium chloride flush, traMADol  General appearance: alert, cooperative and no distress Heart: regular rate and rhythm, S1, S2 normal, no murmur, click, rub or gallop Lungs: clear to auscultation bilaterally Abdomen: soft, non-tender; bowel sounds normal; no masses,  no organomegaly Extremities: extremities normal, atraumatic, no cyanosis or edema Wound: clean and dry  Lab Results: CBC: Recent Labs    03/29/20 1615 03/30/20 0327  WBC 10.0 9.6  HGB 7.9* 8.0*  HCT 25.5* 24.6*  PLT  161 161   BMET:  Recent Labs    03/29/20 0406 03/30/20 0327  NA 135 135  K 3.8 3.2*  CL 98 98  CO2 25 26  GLUCOSE 137* 112*  BUN 18 17  CREATININE 1.22 0.98  CALCIUM 8.7* 8.6*    CMET: Lab Results  Component Value Date   WBC 9.6 03/30/2020   HGB 8.0 (L) 03/30/2020   HCT 24.6 (L) 03/30/2020   PLT 161 03/30/2020   GLUCOSE 112 (H) 03/30/2020   CHOL 149 12/05/2011   TRIG 123 12/05/2011   HDL 56 12/05/2011   LDLCALC 68 12/05/2011   ALT 14 03/28/2020   AST 36 03/28/2020   NA 135 03/30/2020   K 3.2 (L) 03/30/2020   CL 98 03/30/2020   CREATININE 0.98 03/30/2020   BUN 17 03/30/2020   CO2 26 03/30/2020   TSH 1.157  03/26/2020   INR 1.5 (H) 03/27/2020   HGBA1C 4.9 03/26/2020      PT/INR:  Recent Labs    03/27/20 1430  LABPROT 17.1*  INR 1.5*   Radiology: DG Swallowing Func-Speech Pathology  Result Date: 03/29/2020 Objective Swallowing Evaluation: Type of Study: MBS-Modified Barium Swallow Study  Patient Details Name: Connor [name] MRN: [id number] Date of Birth: 11-10-1945 Today's Date: 03/29/2020 Time: SLP Start Time (ACUTE ONLY): 1010 -SLP Stop Time (ACUTE ONLY): 1040 SLP Time Calculation (min) (ACUTE ONLY): 30 min Past Medical History: Past Medical History: Diagnosis Date . Arthritis   OA AND PAIN RT KNEE . Cancer ([place])   h/o neck - ABOUT 6 YRS AGO - TX'D WITH RADIATION AND CHEMO  . Chronic kidney disease  . GERD without esophagitis 03/21/2020 . Head and neck cancer ~ 2009  S/P radiation & [name] . Headache(784.0)  . Hyperlipidemia  . Hypertension  . Kidney carcinoma ([place])   h/o - NEPHRECTOMY  . 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 Prostate cancer ([place]) 05/20/14  Gleason 4+3=7, volume 25 gm . Radiation 2015  hx of, prostate cancer . Seizures ([place])   hx of x yrs, "the bad kind;bite tongue; STATES LAST [place] 2013; WAS SEEING [name] - HE RETIRED AND PT LAST SAW DR. [name] . Stroke ([place]) [place] 2013  UNABLE TO SPEAK OR MOVE AND RT SIDE WEAKNESS AND LOSS OF SKIN SENSITIVITY TO HEAT AND COLD ON RT SIDE, DOUBLE VISION. BALANCE PROBLEMS---STATES STILL HAS DOUBLE VISION AND BALANCE PROBLEM AND RT SIDED LOSS OF SKIN SENSITIVITY Past Surgical History: Past Surgical History: Procedure Laterality Date . hydrocelectomy  11/2000  left . IR FLUORO GUIDE CV LINE RIGHT  08/31/2017 . IR US GUIDE VASC ACCESS RIGHT  08/31/2017 . JOINT REPLACEMENT    LEFT TOTAL KNEE ARTHROPLASTY . MOUTH SURGERY  2019 . NEPHRECTOMY  1990's  left . PROSTATE BIOPSY  05/20/14  Gleason 4+3=7, vol 25 gm . RIGHT/LEFT HEART CATH AND CORONARY ANGIOGRAPHY N/A 03/24/2020  Procedure: RIGHT/LEFT  HEART CATH AND CORONARY ANGIOGRAPHY;  Surgeon: Burnell Blanks, MD;  Location: [facility];  Service: Cardiovascular;  Laterality: N/A; . TEE WITHOUT CARDIOVERSION  10/04/2011  Procedure: TRANSESOPHAGEAL ECHOCARDIOGRAM (TEE);  Surgeon: Candee Furbish, MD;  Location[facility] ENDOSCOPY;  Service: Cardiovascular;  Laterality: N/A; . TOTAL KNEE ARTHROPLASTY  2011  left . 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  He has a hx of dysphagia with MBS on 10/01/2011.  Most recent recommendations for Dysphagia 3 solids and thin liquids with use of strategies. Pt has a hx of CVA and head/neck cancer.   Subjective: Pt was alert and pleasant Assessment / Plan / Recommendation CHL IP CLINICAL IMPRESSIONS 03/29/2020 Clinical Impression Pt was seen for a modified barium swallow study and he presents with moderately-severe oropharyngeal dysphagia with resultant aspiration of thin liquid and nectar-thick liquid, and deep laryngeal penetration of honey-thick liquid and puree.  Pt demonstrated fairly good airway protection before and during the swallow; however, he had mild-severe pharyngeal residue which resulted in aspiration after the swallow.  Aspiration was sensed and pt was able to intermittently clear trace amounts of aspirated materials with  a spontaneous cough.  Of note, pt only consumed tsp of each trial on this examination except for thin liquid via straw (with chin tuck), which resulted in moderate aspiration.  Suspect that laryngeal residue and aspiration would have increased with cup or straw sips of nectar-thick liquid and honey-thick liquid.  Pharyngeal residue was noted to increase as trials increased in viscosity, with severe residue observed with the puree trial.  Oral phase was remarkable for reduced lingual control resulting in premature spillage to the pyriform sinus and reduced lingual strength resulting in oral residue.  Pharyngeal phase was remarkable for reduced BOT retraction resulting in vallecular residue, reduced hyolaryngeal elevation/excursion resulting in vallecular and pyriform residue, and reduced pharyngeal constriction resulting in posterior pharyngeal wall residue.  Suspect reduced UES duration of opening secondary to pharyngeal weakness, but it may also be attributable to esophageal dysfunction.  Pt is at risk for aspiration with all consistencies secondary to pharyngeal weakness and residue.  Recommend continuation of NPO at this time with short-term alternative means of nutrition and frequent oral care.  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  Consideration of long-term alternative means of nutrition may be warranted in the future if dysphagia does not improve.  SLP will f/u per POC.   SLP Visit Diagnosis Dysphagia, oropharyngeal phase (R13.12) Attention and concentration deficit following -- Frontal lobe and executive function deficit following -- Impact on safety and function Severe aspiration risk   CHL IP TREATMENT RECOMMENDATION 03/29/2020 Treatment Recommendations Therapy as outlined in treatment plan below   Prognosis 03/29/2020  Prognosis for Safe Diet Advancement Guarded Barriers to Reach Goals Severity of deficits Barriers/Prognosis Comment -- CHL IP DIET RECOMMENDATION 03/29/2020 SLP Diet Recommendations NPO;Alternative means - temporary;Ice chips PRN after oral care Liquid Administration via -- Medication Administration Via alternative means Compensations -- Postural Changes --   CHL IP OTHER RECOMMENDATIONS 03/29/2020 Recommended Consults -- Oral Care Recommendations Oral care QID;Staff/trained caregiver to provide oral care Other Recommendations Remove water pitcher   CHL IP FOLLOW UP RECOMMENDATIONS 03/29/2020 Follow up Recommendations Skilled Nursing facility   [facility] IP FREQUENCY AND DURATION 03/29/2020 Speech Therapy Frequency (ACUTE ONLY) min 2x/week Treatment  Duration 2 weeks      CHL IP ORAL PHASE 03/29/2020 Oral Phase Impaired Oral - Pudding Teaspoon -- Oral - Pudding Cup -- Oral - Honey Teaspoon Delayed oral transit;Lingual/palatal residue Oral - Honey Cup -- Oral - Nectar Teaspoon Premature spillage;Delayed oral transit;Lingual/palatal residue Oral - Nectar Cup -- Oral - Nectar Straw -- Oral - Thin Teaspoon Premature spillage;Delayed oral transit;Lingual/palatal residue Oral - Thin Cup -- Oral - Thin Straw Premature spillage;Lingual/palatal residue Oral - Puree Premature spillage;Delayed oral transit;Piecemeal swallowing;Lingual/palatal residue Oral - Mech Soft -- Oral - Regular -- Oral - Multi-Consistency -- Oral - Pill -- Oral Phase - Comment --  CHL IP PHARYNGEAL PHASE 03/29/2020 Pharyngeal Phase Impaired Pharyngeal- Pudding Teaspoon -- Pharyngeal -- Pharyngeal- Pudding Cup -- Pharyngeal -- Pharyngeal- Honey Teaspoon Delayed swallow initiation-pyriform sinuses;Penetration/Aspiration before swallow;Pharyngeal residue - pyriform;Pharyngeal residue - valleculae;Pharyngeal residue - posterior pharnyx;Reduced pharyngeal peristalsis;Reduced anterior laryngeal mobility;Reduced laryngeal elevation;Reduced tongue base retraction;Reduced  airway/laryngeal closure Pharyngeal Material enters airway, CONTACTS cords and not ejected out Pharyngeal- Honey Cup -- Pharyngeal -- Pharyngeal- Nectar Teaspoon Delayed swallow initiation-pyriform sinuses;Penetration/Aspiration before swallow;Penetration/Apiration after swallow;Trace aspiration;Pharyngeal residue - pyriform;Pharyngeal residue - valleculae;Pharyngeal residue - posterior pharnyx;Reduced pharyngeal peristalsis;Reduced anterior laryngeal mobility;Reduced laryngeal elevation;Reduced airway/laryngeal closure;Reduced tongue base retraction Pharyngeal Material enters airway, passes BELOW cords then ejected out Pharyngeal- Nectar Cup -- Pharyngeal -- Pharyngeal- Nectar Straw -- Pharyngeal -- Pharyngeal- Thin Teaspoon Delayed swallow initiation-pyriform sinuses;Reduced pharyngeal peristalsis;Reduced anterior laryngeal mobility;Reduced laryngeal elevation;Reduced airway/laryngeal closure;Reduced tongue base retraction;Penetration/Apiration after swallow;Pharyngeal residue - valleculae;Pharyngeal residue - pyriform;Pharyngeal residue - posterior pharnyx;Trace aspiration Pharyngeal Material enters airway, passes BELOW cords and not ejected out despite cough attempt by patient Pharyngeal- Thin Cup -- Pharyngeal -- Pharyngeal- Thin Straw Compensatory strategies attempted (with notebox);Delayed swallow initiation-pyriform sinuses;Penetration/Aspiration before swallow;Penetration/Apiration after swallow;Moderate aspiration;Pharyngeal residue - valleculae;Pharyngeal residue - pyriform;Pharyngeal residue - posterior pharnyx;Reduced pharyngeal peristalsis;Reduced epiglottic inversion;Reduced anterior laryngeal mobility;Reduced laryngeal elevation;Reduced airway/laryngeal closure;Reduced tongue base retraction Pharyngeal Material enters airway, passes BELOW cords and not ejected out despite cough attempt by patient Pharyngeal- Puree Delayed swallow initiation-pyriform sinuses;Penetration/Aspiration before  swallow;Penetration/Apiration after swallow;Pharyngeal residue - valleculae;Pharyngeal residue - pyriform;Pharyngeal residue - posterior pharnyx;Reduced pharyngeal peristalsis;Reduced epiglottic inversion;Reduced anterior laryngeal mobility;Reduced laryngeal elevation;Reduced airway/laryngeal closure;Reduced tongue base retraction Pharyngeal Material enters airway, remains ABOVE vocal cords and not ejected out Pharyngeal- Mechanical Soft -- Pharyngeal -- Pharyngeal- Regular -- Pharyngeal -- Pharyngeal- Multi-consistency -- Pharyngeal -- Pharyngeal- Pill -- Pharyngeal -- Pharyngeal Comment --  CHL IP CERVICAL ESOPHAGEAL PHASE 03/29/2020 Cervical Esophageal Phase Impaired Pudding Teaspoon -- Pudding Cup -- Honey Teaspoon -- Honey Cup -- Nectar Teaspoon -- Nectar Cup -- Nectar Straw -- Thin Teaspoon -- Thin Cup -- Thin Straw -- Puree -- Mechanical Soft -- Regular -- Multi-consistency -- Pill -- Cervical Esophageal Comment suspected residue in the cervical esophagus (radiologist not present to confirm)  Colin Mulders., M.S., [facility] Office: [phone] [place] 03/29/2020, 2:59 PM              [place] EKG SITE RITE  Result Date: 03/29/2020 If Site Rite image not attached, placement could not be confirmed due to current cardiac rhythm.    Assessment/Plan: S/P Procedure(s) (LRB): CORONARY ARTERY BYPASS GRAFTING (CABG) x Three, using left internal mammary artery and right leg greater saphenous vein harvested endoscopically (N/A) TRANSESOPHAGEAL ECHOCARDIOGRAM (TEE) (N/A)  1. CV-NSR in the 90s, BP stable. Remains on milrinone. COOX this morning is 70.3 2. Pulm- CXR reviewed and stable. Only 145cc/24 hours out of chest tubes.  3. Endo-blood  glucose is well controlled 4. H and H 8.0/24.6, expected acute blood loss anemia 5. Renal- continue diuresis. Will need potassium supplementation IV  Plan: Supplement potassium IV, remains NPO with swallow study hopefully today. May be able to  remove chest tubes and foley. Likely can be weaned off milrinone since coox 70.3.       Elgie Collard 03/30/2020 7:39 AM

## 2020-03-30 NOTE — Progress Notes (Addendum)
Nutrition Follow-up  DOCUMENTATION CODES:   Non-severe (moderate) malnutrition in context of chronic illness  INTERVENTION:   Patient day 4 without bowel movement- regimen in place  Tube feeding:  -Vital 1.5 @ 20 ml/hr via Cortrak -Increase by 10 ml Q4 hours to goal rate of 60 ml/hr (1440 ml) -30 ml Prostat BID  Provides: 2360 kcals, 127 grams protein, 1100 ml free water.  Monitor for refeeding.   NUTRITION DIAGNOSIS:   Moderate Malnutrition related to chronic illness(L mandible osteomyelitis/necrosis) as evidenced by mild fat depletion, mild muscle depletion.  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 Presents this admission with acute exacerbation CHF.  6/1- s/p RHC/LHC and coronary angiography 6/4- s/p CABG x3  Pt discussed during ICU rounds and with RN.  Pt remains NPO with swallowing issues. Cortrak placed at beside. Xray confirmed mid gastric placement. Pt concerned he will be unable to work on swallowing with feeding tube. Assured pt he can have feeding tube and work with SLP. Plan supplements once diet advanced. Titrate TF to goal as pt is likely refeeding risk given poor PO. Will need to continue TF to meet 100% of needs until PO intake remains consistent (>/= 75%).   Admission weight: 93 kg  Current weight: 87.4 kg   I/O: -12,430 ml since admit  UOP: 2,128 ml x 24 hrs  Chest tubes: 145 mlx 24 hrs   Drips: milrinone  Medications: dulcolax, colace, 20 mg lasix BID, SS novolog, 10 mg  reglan QID Labs: K 3.2 (L) CBG 101-118  Diet Order:   Diet Order            Diet NPO time specified  Diet effective now              EDUCATION NEEDS:   Education needs have been addressed  Skin:  Skin Assessment: Reviewed RN Assessment(incision chest and R leg and knee)  Last BM:  6/3  Height:   Ht Readings from Last 1 Encounters:  03/22/20 5\' 10"  (1.778 m)    Weight:   Wt Readings from Last 1 Encounters:  03/30/20 87.4 kg    BMI:  Body mass index is 27.65 kg/m.  Estimated Nutritional Needs:   Kcal:  2300-2500 kcal  Protein:  115-130 grams  Fluid:  >/= 2.3 L/day   Mariana Single RD, LDN Clinical Nutrition Pager listed in [place]

## 2020-03-30 NOTE — Progress Notes (Signed)
  Speech Language Pathology Treatment: Dysphagia  Patient Details Name: Connor [name] MRN: [id number] DOB: 08/14/1946 Today's Date: 03/30/2020 Time: 1338-1400 SLP Time Calculation (min) (ACUTE ONLY): 22 min  Assessment / Plan / Recommendation Clinical Impression  Pt was seen for dysphagia treatment and was cooperative during the session. He was re-educated regarding the results of the modified barium swallow study, the nature of his dysphagia, and educated regarding the purpose of dysphagia exercises. All of his questions were answered and pt verbalized understanding and agreement. He completed lingual resistance exercises with verbal prompts to increase resistance. He demonstrated lingual retraction exercises and the masako with cues to maintain lingual protrusion. Effortful swallows were attempted, but pt reported difficulty with this exercise due to xerostomia even when ice chips were used between swallows. Pt tolerated ice chips without overt s/sx of aspiration. SLP will continue to follow pt.    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  He has a hx of dysphagia with MBS on 10/01/2011.  Most recent recommendations for Dysphagia 3 solids and thin liquids with use of strategies. Pt has a hx of CVA and head/neck cancer.        SLP Plan  Continue with current plan of care       Recommendations  Diet recommendations: NPO Medication Administration: Via alternative means                 Oral Care Recommendations: Oral care QID Follow up Recommendations: Skilled Nursing facility SLP Visit Diagnosis: Dysphagia, oropharyngeal phase (R13.12) Plan: Continue with current plan of care       Connor [name] I. Hardin Negus, [place], [place] Office number [phone] Pager [place] 03/30/2020, 5:54 PM

## 2020-03-30 NOTE — Progress Notes (Signed)
Physical Therapy Treatment Patient Details Name: Connor [name] MRN: [id number] DOB: 08/21/1946 Today's Date: 03/30/2020    History of Present Illness Patient is a 74 y/o male who presents with SOB. Admitted with new onset of CHF. Also with bilateral pleural effusions. s/p CABG x3 6/4. 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    PT Comments    Patient now s/p CABG x3 and presents with soreness and difficulty swallowing. Education provided on sternal precautions and "move in the tube." Pt requires Min A for bed mobility, transfers and Min guard assist for ambulation with use of EVA walker for support. Sp02 dropped to 86% on RA, donned 3L and able to maintain Sp02 >90% with activity. Pt highly motivated to mobilize. Encouraged walking 3 times per day. Goals updated. Will continue to follow.    Follow Up Recommendations  Home health PT;Supervision - Intermittent     Equipment Recommendations  Other (comment)(TBA)    Recommendations for Other Services       Precautions / Restrictions Precautions Precautions: Fall;Sternal Precaution Booklet Issued: No Precaution Comments: hx of orthostatic hypotension, reviewed "move in the tube" and sternal precautions Restrictions Weight Bearing Restrictions: Yes RUE Partial Weight Bearing Percentage or Pounds: sternal precautions LUE Partial Weight Bearing Percentage or Pounds: sternal precautions    Mobility  Bed Mobility   Bed Mobility: Rolling;Sidelying to Sit Rolling: Supervision Sidelying to sit: Min assist;HOB elevated       General bed mobility comments: Light Min A with trunk to get EOB; compliant with sternal precautions  Transfers Overall transfer level: Needs assistance Equipment used: None Transfers: Sit to/from Stand Sit to Stand: Min guard;Min assist         General transfer comment: Initially min assist progressing to min guard for  balance/safety on second stand. Deferred transfer to chair as pt getting cotrak placed.  Ambulation/Gait Ambulation/Gait assistance: Min guard Gait Distance (Feet): 150 Feet Assistive device: (EVA walker) Gait Pattern/deviations: Step-through pattern;Decreased stride length;Drifts right/left Gait velocity: decreased Gait velocity interpretation: <1.31 ft/sec, indicative of household ambulator General Gait Details: SLow, guarded and mildly unsteady gait with close min guard; no DOE noted. Sp02 dropped to 86% on RA, donned 3L and able to maintain >90%.   Stairs             Wheelchair Mobility    Modified Rankin (Stroke Patients Only)       Balance Overall balance assessment: Needs assistance;History of Falls Sitting-balance support: Feet supported;No upper extremity supported Sitting balance-Leahy Scale: Good     Standing balance support: During functional activity Standing balance-Leahy Scale: Fair Standing balance comment: Close Min guard for balance, does better wtih UE support for gait                            Cognition Arousal/Alertness: Awake/alert Behavior During Therapy: WFL for tasks assessed/performed Overall Cognitive Status: Within Functional Limits for tasks assessed                                        Exercises      General Comments General comments (skin integrity, edema, etc.): Sp02 dropped on RA to 86%, needed 3L/min 02 Julian for ambulation, Sp02 stayed >90%.      Pertinent Vitals/Pain Pain Assessment: Faces Faces Pain Scale: Hurts  a little bit Pain Location: chest  Pain Descriptors / Indicators: Sore Pain Intervention(s): Monitored during session    Home Living                      Prior Function            PT Goals (current goals can now be found in the care plan section) Acute Rehab PT Goals Patient Stated Goal: to be able to swallow, get stronger and go home PT Goal Formulation: With  patient Time For Goal Achievement: 04/13/20 Potential to Achieve Goals: Good Progress towards PT goals: Progressing toward goals    Frequency    Min 3X/week      PT Plan Current plan remains appropriate    Co-evaluation              AM-PAC PT "6 Clicks" Mobility   Outcome Measure  Help needed turning from your back to your side while in a flat bed without using bedrails?: A Little Help needed moving from lying on your back to sitting on the side of a flat bed without using bedrails?: A Little Help needed moving to and from a bed to a chair (including a wheelchair)?: A Little Help needed standing up from a chair using your arms (e.g., wheelchair or bedside chair)?: A Little Help needed to walk in hospital room?: A Little Help needed climbing 3-5 steps with a railing? : A Little 6 Click Score: 18    End of Session   Activity Tolerance: Patient tolerated treatment well Patient left: in bed;with call bell/phone within reach;Other (comment)(with dietitian present to place cotrak) Nurse Communication: Mobility status PT Visit Diagnosis: Muscle weakness (generalized) (M62.81);Difficulty in walking, not elsewhere classified (R26.2)     Time: 4270-6237 PT Time Calculation (min) (ACUTE ONLY): 23 min  Charges:  $Gait Training: 8-22 mins                    Re-evaluation 8-22 mins          Marisa Severin, PT, DPT Acute Rehabilitation Services Pager [phone] Office [place] 03/30/2020, 1:15 PM

## 2020-03-31 ENCOUNTER — Inpatient Hospital Stay (HOSPITAL_COMMUNITY): Payer: PPO

## 2020-03-31 LAB — CBC
HCT: 26.2 % — ABNORMAL LOW (ref 39.0–52.0)
Hemoglobin: 8.2 g/dL — ABNORMAL LOW (ref 13.0–17.0)
MCH: 27.8 pg (ref 26.0–34.0)
MCHC: 31.3 g/dL (ref 30.0–36.0)
MCV: 88.8 fL (ref 80.0–100.0)
Platelets: 201 10*3/uL (ref 150–400)
RBC: 2.95 MIL/uL — ABNORMAL LOW (ref 4.22–5.81)
RDW: 14.7 % (ref 11.5–15.5)
WBC: 8.1 10*3/uL (ref 4.0–10.5)
nRBC: 0 % (ref 0.0–0.2)

## 2020-03-31 LAB — GLUCOSE, CAPILLARY
Glucose-Capillary: 136 mg/dL — ABNORMAL HIGH (ref 70–99)
Glucose-Capillary: 127 mg/dL — ABNORMAL HIGH (ref 70–99)
Glucose-Capillary: 110 mg/dL — ABNORMAL HIGH (ref 70–99)
Glucose-Capillary: 149 mg/dL — ABNORMAL HIGH (ref 70–99)
Glucose-Capillary: 118 mg/dL — ABNORMAL HIGH (ref 70–99)
Glucose-Capillary: 133 mg/dL — ABNORMAL HIGH (ref 70–99)
Glucose-Capillary: 118 mg/dL — ABNORMAL HIGH (ref 70–99)

## 2020-03-31 LAB — BASIC METABOLIC PANEL
Anion gap: 10 (ref 5–15)
BUN: 25 mg/dL — ABNORMAL HIGH (ref 8–23)
CO2: 27 mmol/L (ref 22–32)
Calcium: 8.9 mg/dL (ref 8.9–10.3)
Chloride: 99 mmol/L (ref 98–111)
Creatinine, Ser: 1 mg/dL (ref 0.61–1.24)
GFR calc Af Amer: >60 mL/min (ref >60)
GFR calc non Af Amer: >60 mL/min (ref >60)
Glucose, Bld: 123 mg/dL — ABNORMAL HIGH (ref 70–99)
Potassium: 3.4 mmol/L — ABNORMAL LOW (ref 3.5–5.1)
Sodium: 136 mmol/L (ref 135–145)

## 2020-03-31 LAB — COOXEMETRY PANEL
Carboxyhemoglobin: 1.8 % — ABNORMAL HIGH (ref 0.5–1.5)
Methemoglobin: 1 % (ref 0.0–1.5)
O2 Saturation: 73.9 %
Total hemoglobin: 10.9 g/dL — ABNORMAL LOW (ref 12.0–16.0)

## 2020-03-31 MED ORDER — METOCLOPRAMIDE HCL 5 MG/ML IJ SOLN: 10.0000 mg | Freq: Four times a day (QID) | INTRAMUSCULAR | Status: DC

## 2020-03-31 MED ORDER — SORBITOL 70 % SOLN
30.0000 mL | Freq: Once | Status: DC
  Filled 2020-03-31 (×2): qty 30

## 2020-03-31 MED ORDER — POLYETHYLENE GLYCOL 3350 17 G PO PACK
17.0000 g | PACK | Freq: Every day | ORAL | Status: DC
  Administered 2020-03-31 – 2020-04-02 (×3): 17 g via ORAL
  Filled 2020-03-31 (×7): qty 1

## 2020-03-31 MED ORDER — POTASSIUM CHLORIDE 10 MEQ/50ML IV SOLN
10.0000 meq | INTRAVENOUS | Status: DC
  Administered 2020-03-31: 10 meq via INTRAVENOUS
  Filled 2020-03-31 (×2): qty 50

## 2020-03-31 MED ORDER — FUROSEMIDE 10 MG/ML IJ SOLN
40.0000 mg | Freq: Two times a day (BID) | INTRAMUSCULAR | Status: DC
  Administered 2020-03-31 – 2020-04-02 (×3): 40 mg via INTRAVENOUS
  Filled 2020-03-31 (×3): qty 4

## 2020-03-31 MED ORDER — DEXTROSE-NACL 5-0.45 % IV SOLN: INTRAVENOUS | Status: DC

## 2020-03-31 MED ORDER — POTASSIUM CHLORIDE 10 MEQ/50ML IV SOLN
10.0000 meq | INTRAVENOUS | Status: AC
  Administered 2020-03-31 (×4): 10 meq via INTRAVENOUS
  Filled 2020-03-31 (×3): qty 50

## 2020-03-31 MED ORDER — MILRINONE LACTATE IN DEXTROSE 20-5 MG/100ML-% IV SOLN: 0.0000 ug/kg/min | INTRAVENOUS | Status: DC

## 2020-03-31 NOTE — Progress Notes (Addendum)
[address]       [place] [address]             [phone]      4 Days Post-Op Procedure(s) (LRB): CORONARY ARTERY BYPASS GRAFTING (CABG) x Three, using left internal mammary artery and right leg greater saphenous vein harvested endoscopically (N/A) TRANSESOPHAGEAL ECHOCARDIOGRAM (TEE) (N/A) Subjective: Sitting up in the bedside chair. Awake and alert.  Had a sense that the liquid Tylenol was rising in his throat as it was being administered through the Cortrak earlier. He said this had improved by the second attempt later in the morning. He otherwise say he feels good and is pleased with his progress.  Milrinone and TF infusing.  No BM yet.  Objective: Vital signs in last 24 hours: Temp:  [96.9 F (36.1 C)-98.4 F (36.9 C)] 98.4 F (36.9 C) (06/08 1049) Pulse Rate:  [75-113] 77 (06/08 0900) Cardiac Rhythm: Normal sinus rhythm (06/08 1049) Resp:  [11-21] 18 (06/08 0900) BP: (80-126)/(46-74) 98/55 (06/08 0900) SpO2:  [87 %-100 %] 96 % (06/08 0900) FiO2 (%):  [30 %] 30 % (06/08 0500)   Intake/Output from previous day: 06/07 0701 - 06/08 0700 In: 1468.6 [I.V.:405.2; NG/GT:754; IV Piggyback:309.4] Out: 1810 [Urine:1790; Chest Tube:20] Intake/Output this shift: Total I/O In: 149.6 [I.V.:5.4; NG/GT:100; IV Piggyback:44.2] Out: 190 [Urine:190]  General appearance: no distress. TF is infusing at 2ml/hr via Cortrak.  Neurologic: intact Heart: regular rate and rhythm Lungs: Breath sounds are clear to auscultation.  Abdomen: Soft, non-tender, active bowel sounds. Extremities: Mild LE edema. The right LE EVH incisions are dry and healing without sn's of complication. Right basilic PICC-site clean and dry. Wound: The sternotomy incision is intact and dry.   Lab Results: Recent Labs    03/30/20 0327 03/31/20 0537  WBC 9.6 8.1  HGB 8.0* 8.2*  HCT 24.6* 26.2*  PLT 161 201   BMET:  Recent Labs    03/30/20 1839 03/31/20 0537  NA 135 136  K 3.3*  3.4*  CL 99 99  CO2 27 27  GLUCOSE 123* 123*  BUN 20 25*  CREATININE 1.02 1.00  CALCIUM 9.0 8.9    PT/INR: No results for input(s): LABPROT, INR in the last 72 hours. ABG    Component Value Date/Time   PHART 7.398 03/28/2020 0519   HCO3 24.8 03/28/2020 0519   TCO2 26 03/28/2020 0519   ACIDBASEDEF 1.0 03/27/2020 2300   O2SAT 73.9 03/31/2020 0537   CBG (last 3)  Recent Labs    03/31/20 0525 03/31/20 0749 03/31/20 1232  GLUCAP 110* 149* 118*    Assessment/Plan: S/P Procedure(s) (LRB): CORONARY ARTERY BYPASS GRAFTING (CABG) x Three, using left internal mammary artery and right leg greater saphenous vein harvested endoscopically (N/A) TRANSESOPHAGEAL ECHOCARDIOGRAM (TEE) (N/A)  -POD4 CABG x 3 for LM and 3VCAD with pre-op EF 25%. Making steady progress, weaning milrinone, CoOx 73.  Continue ASA, carvedilol, Crestor.   -Dysphagia- Chronic related to previous neck radiation and multiple procedures to left mandible for osteonecrosis. Tolerating feeding through Cortrak although it is not positioned optimally. Tip is in the proximal stomach. Mr. Ohms reported the dietician had difficulty advancing the tube as it was placed yesterday.   -Expected acute blood loss anemia and chronic anemia. Hct is trending up.   -Volume excess- was in acute heart failure on admission.  Wt currently ~5.5kg below admission Wt. CXR shows no significant effusions and no pulmonary edema. He is on RA with acceptable sats.  Approaching euvolemia.     -Hypokalemia- replacement ordered.   -History of seizure disorder- he is back on his usual dose of Lamictal.   -No BM since surgery- will try a dose of Miralax today.     LOS: 10 days    Antony Odea , Hershal Coria [phone] 03/31/2020  Patient is transferred out of ICU but still has significant swallowing difficulty and a feeding tube. He is able to walk with a rolling walker but remains weak. We will ask for CIR evaluation because of his poor  functional baseline status and now acute on chronic swallow dysfunction  Milrinone has been weaned off-check  coox in a.patient examined and medical record reviewed,agree with above note. Tharon Aquas Trigt III 03/31/2020

## 2020-03-31 NOTE — Progress Notes (Signed)
Patient complained of being able to feel liquid come back up in his throat when giving tylenol down his tube therefore rescheduled 0600 dose of tylenol this morning for later in the morning and made oncoming RN Lorriane Shire aware that this occurred so that she can pass this on the MD this a.m. during rounds.

## 2020-03-31 NOTE — Progress Notes (Signed)
Pt complaining of feeling tube feeding backing up into his throat. Pt spitting contents into cup and asked RN to turn tube feeding off. MD called and instructed RN to turn tube feeding off and new verbal order received. Will implement new order and continue to monitor pt.

## 2020-03-31 NOTE — Progress Notes (Signed)
CARDIAC REHAB PHASE I   PRE:  Rate/Rhythm: 84 SR    BP: sitting 117/82    SaO2: 94 RA  MODE:  Ambulation: 340 ft   POST:  Rate/Rhythm: 93 SR    BP: sitting 129/82     SaO2: 97 RA   Pt requested to go to BR for BM. Able to roll out of bed with min assist and stand independently. Used RW to ambulate to BR. Then pt ready to ambulate. Able to stand from toilet after cleaning himself and ambulate with RW in hall. Slow and steady, no c/o. VSS, to recliner after walk. Only c/o is that he feels cortrak does not reach stomach and he feels liquid in his throat. Encouraged IS. This was his third walk today. [place], ACSM 03/31/2020 2:51 PM

## 2020-03-31 NOTE — Progress Notes (Signed)
      [address]       [place] [address]             [phone]      Mr. Sevillano felt he was tolerating the TF better this morning after getting out of bed and walking. The Cortrak team requested holding off on advancing the tube since this was tried multiple times yesterday. This afternoon, he again feels he is having some reflux into his throat.  Will hold the TF for 2 hours then resume at half the goal rate (72ml/hr). Continue Reglan. Will re-consult the Cortrak team tomorrow to attempt to reposition the tube more distally. If this can not be accomplished will ask IR or GI to assist with placement.   Macarthur Critchley, PA-C [phone]

## 2020-03-31 NOTE — Progress Notes (Signed)
Physical Therapy Treatment Patient Details Name: Connor [name] MRN: [id number] DOB: Feb 06, 1946 Today's Date: 03/31/2020    History of Present Illness Patient is a 74 y/o male who presents with SOB. Admitted with new onset of CHF. Also with bilateral pleural effusions. s/p CABG x3 6/4. 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    PT Comments    Pt required up to min guard for bed mobility, transfers and amb with RW. Pt able to complete x4 sit to stands in 30 sec sit to stand test , scoring below average as compared to same aged peers, indicating high fall risk. Educated pt on proper incentive spirometry sets and reps as well as LE therex to complete while sitting in recliner. Pt verbalized sternal precautions and required minimal verbal cueing throughout session to maintain precautions. Will continue to follow acutely until d/c to next venue of care.   BP supine 105/65 BP sitting 98/55 BP standing 98/55 BP end of session 115/64    Follow Up Recommendations  Home health PT;Supervision - Intermittent     Equipment Recommendations  Other (comment)(TBD)    Recommendations for Other Services       Precautions / Restrictions Precautions Precautions: Fall;Sternal Precaution Comments: hx of orthostatic hypotension, reviewed "move in the tube" and sternal precautions Restrictions Weight Bearing Restrictions: Yes(sternal precautions) RUE Weight Bearing: Partial weight bearing RUE Partial Weight Bearing Percentage or Pounds: sternal precautions LUE Weight Bearing: Partial weight bearing LUE Partial Weight Bearing Percentage or Pounds: sternal precautions    Mobility  Bed Mobility Overal bed mobility: Needs Assistance Bed Mobility: Supine to Sit     Supine to sit: Modified independent (Device/Increase time) Sit to supine: Supervision   General bed mobility comments: Pt required supervision for safety  and reminders to maintain sternal precautions  Transfers Overall transfer level: Needs assistance Equipment used: Rolling walker (2 wheeled) Transfers: Sit to/from Stand Sit to Stand: Min guard         General transfer comment: Pt required min guard for sit to stand, with verbal reminders and reeducation of sternal precautions.  Ambulation/Gait Ambulation/Gait assistance: Min guard Gait Distance (Feet): 260 Feet Assistive device: Rolling walker (2 wheeled) Gait Pattern/deviations: Step-through pattern;Decreased stride length;Wide base of support Gait velocity: decreased   General Gait Details: Slow, guarded gait with min guard for safety and stability. Spo2 maintained >88% RA.   Stairs             Wheelchair Mobility    Modified Rankin (Stroke Patients Only)       Balance Overall balance assessment: Needs assistance;History of Falls Sitting-balance support: Feet supported;No upper extremity supported Sitting balance-Leahy Scale: Good     Standing balance support: During functional activity;Bilateral upper extremity supported Standing balance-Leahy Scale: Fair Standing balance comment: min guard for safety                            Cognition Arousal/Alertness: Awake/alert Behavior During Therapy: WFL for tasks assessed/performed Overall Cognitive Status: Within Functional Limits for tasks assessed                                 General Comments: Pt motivated to complete therapy and learn exercises to do while seated      Exercises Total Joint Exercises Long Arc Quad: AROM;10 reps;Both;Seated Other Exercises  Other Exercises: 30 sec sit to stand-4x, used arms on thighs mainaining sternal precautions    General Comments General comments (skin integrity, edema, etc.): Pt VSS during session, sternal incision WNL pre and post session. Pt reports no pain and is motivated to complete PT. Pt educated on proper use, sets and repetitions  for incentive spirometry.      Pertinent Vitals/Pain Pain Assessment: No/denies pain    Home Living                      Prior Function            PT Goals (current goals can now be found in the care plan section) Acute Rehab PT Goals Patient Stated Goal: to be able to swallow, get stronger and go home PT Goal Formulation: With patient Time For Goal Achievement: 04/13/20 Potential to Achieve Goals: Good    Frequency    Min 3X/week      PT Plan Current plan remains appropriate    Co-evaluation              AM-PAC PT "6 Clicks" Mobility   Outcome Measure  Help needed turning from your back to your side while in a flat bed without using bedrails?: None Help needed moving from lying on your back to sitting on the side of a flat bed without using bedrails?: A Little Help needed moving to and from a bed to a chair (including a wheelchair)?: A Little Help needed standing up from a chair using your arms (e.g., wheelchair or bedside chair)?: A Little Help needed to walk in hospital room?: A Little Help needed climbing 3-5 steps with a railing? : A Little 6 Click Score: 19    End of Session Equipment Utilized During Treatment: Gait belt Activity Tolerance: Patient tolerated treatment well Patient left: in chair;with call bell/phone within reach Nurse Communication: Mobility status PT Visit Diagnosis: Muscle weakness (generalized) (M62.81);Difficulty in walking, not elsewhere classified (R26.2)     Time: 3094-0768 PT Time Calculation (min) (ACUTE ONLY): 37 min  Charges:  $Therapeutic Exercise: 23-37 mins                     [employer] SPT 03/31/2020    Rolland Porter 03/31/2020, 1:37 PM

## 2020-03-31 NOTE — TOC Progression Note (Addendum)
Transition of Care [facility]) - Progression Note    Patient Details  Name: Jeanne Terrance MRN: [id number] Date of Birth: [birth year]  Transition of Care [facility] [place]) CM/SW Contact  Zenon Mayo, RN Phone Number: 03/31/2020, 5:09 PM  Clinical Narrative:    NCM [facility], he does not have a preference,  NCM made referral to [facility] he is able to take referral for [facility] for CHF disease management.  Soc will begin 24 to 48 hrs post dc.  PT has now seen patient and wants to add HHPT, NCM notified Cory to add HHPT. Will need HHRN, HHPT orders.      Barriers to Discharge: Continued Medical Work up  Expected Discharge Plan and Services                             DME Agency: NA       HH Arranged: RN, Disease Management, PT [place] Agency: [place] Date [place]: 03/31/20 Time [place]: 32 Representative spoke with at [place]: [place] ([place]) Interventions    Readmission Risk Interventions No flowsheet data found.

## 2020-03-31 NOTE — Progress Notes (Signed)
Patient has had periods of apnea this morning with his oxygen dropping down in the 40's. Initially placed on nasal cannula then venti mask with continued periods of desaturation. RT was called to come assess and [place] cameraed in room. Patient now on 7 L [place] high flow and o2 sats 95% and greater. Will pass along in report this morning. Patient has cortrak therefore unsure if cpap would be beneficial.

## 2020-03-31 NOTE — Progress Notes (Signed)
Patient complaining of feeling of fluid moving up to his throat and coughing it up. States he "can feel the tube feed contents in his throat", patient is clearing his throat and spitting out contents in a cup. Patient has not been able to tolerate any medication via tube feed. Enid Cutter, PA called, verbal order to stop tube feed for 2 hours and restart at half goal at 58mL/hr.

## 2020-04-01 ENCOUNTER — Inpatient Hospital Stay (HOSPITAL_COMMUNITY): Payer: PPO

## 2020-04-01 LAB — GLUCOSE, CAPILLARY
Glucose-Capillary: 100 mg/dL — ABNORMAL HIGH (ref 70–99)
Glucose-Capillary: 122 mg/dL — ABNORMAL HIGH (ref 70–99)
Glucose-Capillary: 103 mg/dL — ABNORMAL HIGH (ref 70–99)
Glucose-Capillary: 102 mg/dL — ABNORMAL HIGH (ref 70–99)
Glucose-Capillary: 127 mg/dL — ABNORMAL HIGH (ref 70–99)
Glucose-Capillary: 103 mg/dL — ABNORMAL HIGH (ref 70–99)

## 2020-04-01 LAB — BASIC METABOLIC PANEL
Anion gap: 8 (ref 5–15)
BUN: 20 mg/dL (ref 8–23)
CO2: 29 mmol/L (ref 22–32)
Calcium: 8.8 mg/dL — ABNORMAL LOW (ref 8.9–10.3)
Chloride: 100 mmol/L (ref 98–111)
Creatinine, Ser: 1.04 mg/dL (ref 0.61–1.24)
GFR calc Af Amer: >60 mL/min (ref >60)
GFR calc non Af Amer: >60 mL/min (ref >60)
Glucose, Bld: 103 mg/dL — ABNORMAL HIGH (ref 70–99)
Potassium: 3.4 mmol/L — ABNORMAL LOW (ref 3.5–5.1)
Sodium: 137 mmol/L (ref 135–145)

## 2020-04-01 LAB — CBC
HCT: 27.3 % — ABNORMAL LOW (ref 39.0–52.0)
Hemoglobin: 8.6 g/dL — ABNORMAL LOW (ref 13.0–17.0)
MCH: 27.8 pg (ref 26.0–34.0)
MCHC: 31.5 g/dL (ref 30.0–36.0)
MCV: 88.3 fL (ref 80.0–100.0)
Platelets: 237 10*3/uL (ref 150–400)
RBC: 3.09 MIL/uL — ABNORMAL LOW (ref 4.22–5.81)
RDW: 14.8 % (ref 11.5–15.5)
WBC: 7.8 10*3/uL (ref 4.0–10.5)
nRBC: 0 % (ref 0.0–0.2)

## 2020-04-01 LAB — COOXEMETRY PANEL
Carboxyhemoglobin: 2 % — ABNORMAL HIGH (ref 0.5–1.5)
Methemoglobin: 1.2 % (ref 0.0–1.5)
O2 Saturation: 64.5 %
Total hemoglobin: 10.1 g/dL — ABNORMAL LOW (ref 12.0–16.0)

## 2020-04-01 MED ORDER — TRAVASOL 10 % IV SOLN: INTRAVENOUS | Status: DC

## 2020-04-01 MED ORDER — ASPIRIN 81 MG PO CHEW
81.0000 mg | CHEWABLE_TABLET | Freq: Every day | ORAL | Status: DC
  Administered 2020-04-01 – 2020-04-09 (×8): 81 mg
  Filled 2020-04-01 (×9): qty 1

## 2020-04-01 MED ORDER — PRO-STAT SUGAR FREE PO LIQD
30.0000 mL | Freq: Two times a day (BID) | ORAL | Status: DC
  Administered 2020-04-01 – 2020-04-06 (×10): 30 mL
  Filled 2020-04-01 (×11): qty 30

## 2020-04-01 MED ORDER — BISACODYL 10 MG RE SUPP
10.0000 mg | Freq: Once | RECTAL | Status: AC
  Administered 2020-04-01: 10 mg via RECTAL
  Filled 2020-04-01: qty 1

## 2020-04-01 MED ORDER — FLEET ENEMA 7-19 GM/118ML RE ENEM
1.0000 | ENEMA | Freq: Once | RECTAL | Status: AC
  Administered 2020-04-01: 1 via RECTAL
  Filled 2020-04-01: qty 1

## 2020-04-01 MED ORDER — PANTOPRAZOLE SODIUM 40 MG PO PACK
40.0000 mg | PACK | Freq: Every day | ORAL | Status: DC
  Administered 2020-04-01 – 2020-04-09 (×8): 40 mg
  Filled 2020-04-01 (×9): qty 20

## 2020-04-01 MED ORDER — SORBITOL 70 % SOLN
60.0000 mL | Freq: Once | Status: DC
  Filled 2020-04-01: qty 60

## 2020-04-01 MED ORDER — VITAL 1.5 CAL PO LIQD
1000.0000 mL | ORAL | Status: DC
  Administered 2020-04-01 – 2020-04-05 (×6): 1000 mL
  Filled 2020-04-01 (×10): qty 1000

## 2020-04-01 NOTE — Progress Notes (Signed)
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  He has since undergone CABG for ischemic cardiomyopathy including recovering well and now in [place].  Discussed plan for conservative management for now unless he has problems with uncontrolled heart failure I.e. high-output heart failure from suspected fistula.  Plan follow-up in our office in 3 months with a carotid duplex.  He seems asymptomatic from this at this time.  Would likely need carotid arteriogram and possible covered stent if treatment required in future given neck radiation but again would elect conservative management unless becomes symptomatic.  Marty Heck, MD [facility] Office: [place]

## 2020-04-01 NOTE — Progress Notes (Signed)
Speech Language Pathology Treatment: Dysphagia  Patient Details Name: Connor [name] MRN: [id number] DOB: 04/05/1946 Today's Date: 04/01/2020 Time: 9476-5465 SLP Time Calculation (min) (ACUTE ONLY): 35 min  Assessment / Plan / Recommendation Clinical Impression  Pt was seen for treatment. He reported that he has been trying some of the dysphagia exercises, but stated that swallowing is "the hardest part" since he has nothing to swallow. Cups of water were noted at bedside and he was reminded that he is currently allowed ice chips and water via tsp following oral care, but stated that he "can't swallow that". He expressed that he would like to try to swallow something to see how he is doing but that staff "wouldn't let me swallow". He was re-educated regarding the purpose of the sessions with speech pathology and that the SLP would be the one to asssess his progress with swallowing. Pt completed pharyngeal strengthening but additional exercises were deferred since pt progressively became more upset with each repetition stating that he "just needs to eat" and "you ain't gon make my swallowing any better". Pt was re-educated regarding the results of the most recent modified barium swallow study and the fact that, considering his reports and presentation,  his swallow function is likely worse than his chronic dysphagia. Pt attributed his aspiration on all consistencies during the last swallow study to the strap which was used on the swallow chair for safety stating "nobody can swallow like that". Upon further education regarding the plan to repeat a modified barium swallow study, pt made statements including, "I'm not gonna argue with you" but, with voiced displeasure, agreed to participate in the stuidy. Pt was amenable to p.o. trials and exhibited throat clearing with 2/6 boluses of thin liquids via cup and coughing with 3/6 boluses of thin liquids via straw. Throat clearing was inconsistently noted  with puree solids. Pt's swallow function clinically appears improved compared to the date of the evaluation, and a repeat modified barium swallow study is recommended to re-assess swallow function. Pt may have ice chips and water via tsp following thorough oral care.    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  He has a hx of dysphagia with MBS on 10/01/2011.  Most recent recommendations for Dysphagia 3 solids and thin liquids with use of strategies. Pt has a hx of CVA and head/neck cancer.        SLP Plan  Continue with current plan of care       Recommendations  Diet recommendations: NPO; ice chips and water via tsp following oral care. Medication Administration: Via alternative means                Oral Care Recommendations: Oral care QID Follow up Recommendations: Skilled Nursing facility SLP Visit Diagnosis: Dysphagia, oropharyngeal phase (R13.12) Plan: Continue with current plan of care       Taline Nass I. Hardin Negus, [place], [place] Office number [phone] Pager [place] 04/01/2020, 5:43 PM

## 2020-04-01 NOTE — Progress Notes (Signed)
CARDIAC REHAB PHASE I   PRE:  Rate/Rhythm: 79 SR  BP:  Supine:   Sitting: 111/65  Standing:    SaO2: 93%RA  MODE:  Ambulation: 300 ft   POST:  Rate/Rhythm: 96 SR     SaO2: 93%RA 1300-1325 Pt getting ready to get tube feedings restarted. RN in room. Pt walked 300 ft on RA with rolling walker and asst x 1. Gait fairly steady. Seemed to tire easily. Took standing rest break. To recliner after walk with call bell. RN in room to restart feedings after walk.   Graylon Good, RN BSN  04/01/2020 1:22 PM

## 2020-04-01 NOTE — Progress Notes (Signed)
Patient's BP was 95/56 at 1712. Hold coreg & lasix due to hypotensive. Notified PA Tessa regarding this matter. Continue to monitor BP. HS [employer]

## 2020-04-01 NOTE — Procedures (Addendum)
Cortrak  Person Inserting Tube:  Esaw Dace, RD Tube Type:  Cortrak - 43 inches Tube Location:  Right nare Initial Placement:  Postpyloric Secured by: Bridle Technique Used to Measure Tube Placement:  Documented cm marking at nare/ corner of mouth Cortrak Secured At:  83 cm    Cortrak Tube Team Note:  Received page from [employer] with CT surgery requesting attempted Cortrak advancement as pt experiencing reflux, concern for aspiration.   RD unclipped bridle and was able to successfully advance Cortrak tube into the duodenum. Cortrak then bridled in place. Awaiting xray for final confirmation  X-ray is required, abdominal x-ray has been ordered by the Cortrak team. Please confirm tube placement before using the Cortrak tube.   If the tube becomes dislodged please keep the tube and contact the Cortrak team at [email or web address] (password [email or web address]) for replacement.  If after hours and replacement cannot be delayed, place a NG tube and confirm placement with an abdominal x-ray.    Kerman Passey MS, RDN, LDN, CNSC Registered Dietitian III RD Pager Number and RD On-Call Pager Number Located in [place]

## 2020-04-01 NOTE — Progress Notes (Signed)
Inpatient Rehabilitation-Admissions Coordinator   Met with pt bedside for rehab assessment. Pt was sitting up in the recliner having just completed ambulation with cardiac rehab. Pt reports feeling confident in his abilities and would prefer to go home with [facility] therapy once medically ready. As pt is already ambulating over 300 feet and is Min G level with transfers and ambulation, agree with therapy recommendations for home with [facility] therapy. AC will not pursue CIR placement at this time.   Raechel Ache, OTR/L  Rehab Admissions Coordinator  [phone] 04/01/2020 1:48 PM

## 2020-04-01 NOTE — Progress Notes (Signed)
Nutrition Follow-up  DOCUMENTATION CODES:   Non-severe (moderate) malnutrition in context of chronic illness  INTERVENTION:   Re-start tube feeding:  -Vital 1.5 @ 20 ml/hr via post pyloric Cortrak -Increase by 10 ml Q6 hours to goal rate of 60 ml/hr (1440 ml) -30 ml Prostat BID  At goal TF provides: 2360 kcals, 127 grams protein, 1100 ml free water.  Monitor for refeeding.   NUTRITION DIAGNOSIS:   Moderate Malnutrition related to chronic illness(L mandible osteomyelitis/necrosis) as evidenced by mild fat depletion, mild muscle depletion.  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 Presents this admission with acute exacerbation CHF.  6/1- s/p RHC/LHC and coronary angiography 6/4- s/p CABG x3 6/7- s/p gastric Cortrak placement  Pt complaining of "reflux" with medication pushes. TF stopped due to this. Cortrak advanced at bedside. Xray confirms tube is at the duodenal jejunal junction. Expect improvement in symptoms with this advancement. Plan slow titration of TF to goal. TCTS and RN made aware.   No BM in a week despite scheduled bowel regimen and walking around the unit. Plan suppository and enema this afternoon.   Plan to continue D5 until TF is advanced and tolerated.   Admission weight: 93 kg  Current weight: 83.5 kg   I/O: -12,708 ml since admit UOP: 1,990 ml x 24 hrs   Drips: D5 in 1/2NS @ 50 ml/hr  Medications: dulcolax, colace, 40 mg lasix BID, SS novolog, 10 mg reglan QID, miralax Labs: K  3.4 (L) CBG 100-141  Diet Order:   Diet Order            Diet NPO time specified  Diet effective now              EDUCATION NEEDS:   Education needs have been addressed  Skin:  Skin Assessment: Reviewed RN Assessment(incision chest and R leg and knee)  Last BM:  6/3- per patient  Height:   Ht Readings from Last 1 Encounters:  03/22/20 5\' 10"  (1.778 m)    Weight:   Wt Readings from Last 1 Encounters:  04/01/20 83.5 kg    BMI:  Body mass index is 26.4 kg/m.  Estimated Nutritional Needs:   Kcal:  2300-2500 kcal  Protein:  115-130 grams  Fluid:  >/= 2.3 L/day   Mariana Single RD, LDN Clinical Nutrition Pager listed in [place]

## 2020-04-01 NOTE — Plan of Care (Signed)
  Problem: Activity: Goal: Capacity to carry out activities will improve Outcome: Progressing   Problem: Cardiac: Goal: Ability to achieve and maintain adequate cardiopulmonary perfusion will improve Outcome: Progressing   Problem: Education: Goal: Ability to demonstrate management of disease process will improve Outcome: Progressing   Problem: Nutrition: Goal: Adequate nutrition will be maintained Outcome: Progressing   Problem: Coping: Goal: Level of anxiety will decrease Outcome: Progressing   Problem: Elimination: Goal: Will not experience complications related to bowel motility Outcome: Progressing   Problem: Pain Managment: Goal: General experience of comfort will improve Outcome: Progressing   Problem: Safety: Goal: Ability to remain free from injury will improve Outcome: Progressing

## 2020-04-01 NOTE — Progress Notes (Addendum)
      [address]       [place] [address]             [phone]      5 Days Post-Op Procedure(s) (LRB): CORONARY ARTERY BYPASS GRAFTING (CABG) x Three, using left internal mammary artery and right leg greater saphenous vein harvested endoscopically (N/A) TRANSESOPHAGEAL ECHOCARDIOGRAM (TEE) (N/A) Subjective: Feels good this morning. Recovering well from the surgery and walking around the unit, however no bowel movement in over a week.   Objective: Vital signs in last 24 hours: Temp:  [97.5 F (36.4 C)-98.4 F (36.9 C)] 97.6 F (36.4 C) (06/09 0358) Pulse Rate:  [73-85] 77 (06/09 0358) Cardiac Rhythm: Normal sinus rhythm (06/08 1900) Resp:  [13-21] 15 (06/09 0358) BP: (98-126)/(55-82) 105/63 (06/09 0358) SpO2:  [95 %-99 %] 97 % (06/09 0358) Weight:  [83.5 kg] 83.5 kg (06/09 0500)    Intake/Output from previous day: 06/08 0701 - 06/09 0700 In: 1397.9 [I.V.:431.2; NG/GT:628; IV Piggyback:338.7] Out: 1990 [Urine:1990] Intake/Output this shift: No intake/output data recorded.  General appearance: alert, cooperative and no distress Heart: regular rate and rhythm, S1, S2 normal, no murmur, click, rub or gallop Lungs: clear to auscultation bilaterally Abdomen: soft, non-tender; bowel sounds normal; no masses,  no organomegaly Extremities: extremities normal, atraumatic, no cyanosis or edema Wound: clean and dry  Lab Results: Recent Labs    03/31/20 0537 04/01/20 0420  WBC 8.1 7.8  HGB 8.2* 8.6*  HCT 26.2* 27.3*  PLT 201 237   BMET:  Recent Labs    03/31/20 0537 04/01/20 0420  NA 136 137  K 3.4* 3.4*  CL 99 100  CO2 27 29  GLUCOSE 123* 103*  BUN 25* 20  CREATININE 1.00 1.04  CALCIUM 8.9 8.8*    PT/INR: No results for input(s): LABPROT, INR in the last 72 hours. ABG    Component Value Date/Time   PHART 7.398 03/28/2020 0519   HCO3 24.8 03/28/2020 0519   TCO2 26 03/28/2020 0519   ACIDBASEDEF 1.0 03/27/2020 2300   O2SAT 64.5  04/01/2020 0420   CBG (last 3)  Recent Labs    03/31/20 1950 03/31/20 2334 04/01/20 0400  GLUCAP 118* 122* 100*    Assessment/Plan: S/P Procedure(s) (LRB): CORONARY ARTERY BYPASS GRAFTING (CABG) x Three, using left internal mammary artery and right leg greater saphenous vein harvested endoscopically (N/A) TRANSESOPHAGEAL ECHOCARDIOGRAM (TEE) (N/A)  1. CV-NSR in the 70s, BP stable. Continue ASA, coreg, and crestor.  2. Pulm-CXR reviewed, stable.  3. NPO, Cortrak in place but may need advanced since he feels like hes getting some reflux. TF turned down to 30/hr. Cortrak team consulted this morning.  4. Endo- blood glucose well controlled 5. H and H 8.6/27.3, expected acute blood loss anemia 6. No anticoagulation for this patient or ASA > 81. Hx of bleeding  Plan: Consult Cortrak this morning-TF stopped. Waiting to hear back. Will try suppository this morning for constipation since the patient is very uncomfortable. Continue ambulation.    LOS: 11 days    Connor [name] 04/01/2020  Feeding tube manipulated extensively but cannot be passed further than mid stomach and patient is having aspiration with documented desaturations at night.  Tube feeds have stopped.  Will use TPN as bridge to resuming oral intake when postoperative swallow dysfunction resolves.  Will order enema for constipation.  patient examined and medical record reviewed,agree with above note. Connor [name] 04/01/2020

## 2020-04-01 NOTE — Plan of Care (Signed)
  Problem: Education: Goal: Ability to demonstrate management of disease process will improve Outcome: Progressing Goal: Ability to verbalize understanding of medication therapies will improve Outcome: Progressing Goal: Individualized Educational Video(s) Outcome: Progressing   Problem: Activity: Goal: Capacity to carry out activities will improve Outcome: Progressing   Problem: Cardiac: Goal: Ability to achieve and maintain adequate cardiopulmonary perfusion will improve Outcome: Progressing   Problem: Education: Goal: Ability to demonstrate management of disease process will improve Outcome: Progressing Goal: Ability to verbalize understanding of medication therapies will improve Outcome: Progressing Goal: Individualized Educational Video(s) Outcome: Progressing   Problem: Activity: Goal: Capacity to carry out activities will improve Outcome: Progressing

## 2020-04-02 ENCOUNTER — Inpatient Hospital Stay (HOSPITAL_COMMUNITY): Payer: PPO

## 2020-04-02 LAB — CBC
HCT: 28.3 % — ABNORMAL LOW (ref 39.0–52.0)
Hemoglobin: 8.9 g/dL — ABNORMAL LOW (ref 13.0–17.0)
MCH: 27.6 pg (ref 26.0–34.0)
MCHC: 31.4 g/dL (ref 30.0–36.0)
MCV: 87.9 fL (ref 80.0–100.0)
Platelets: 260 10*3/uL (ref 150–400)
RBC: 3.22 MIL/uL — ABNORMAL LOW (ref 4.22–5.81)
RDW: 14.6 % (ref 11.5–15.5)
WBC: 7.4 10*3/uL (ref 4.0–10.5)
nRBC: 0 % (ref 0.0–0.2)

## 2020-04-02 LAB — BASIC METABOLIC PANEL
Anion gap: 9 (ref 5–15)
BUN: 23 mg/dL (ref 8–23)
CO2: 29 mmol/L (ref 22–32)
Calcium: 8.6 mg/dL — ABNORMAL LOW (ref 8.9–10.3)
Chloride: 98 mmol/L (ref 98–111)
Creatinine, Ser: 1.04 mg/dL (ref 0.61–1.24)
GFR calc Af Amer: >60 mL/min (ref >60)
GFR calc non Af Amer: >60 mL/min (ref >60)
Glucose, Bld: 137 mg/dL — ABNORMAL HIGH (ref 70–99)
Potassium: 3.4 mmol/L — ABNORMAL LOW (ref 3.5–5.1)
Sodium: 136 mmol/L (ref 135–145)

## 2020-04-02 LAB — COOXEMETRY PANEL
Carboxyhemoglobin: 1.6 % — ABNORMAL HIGH (ref 0.5–1.5)
Methemoglobin: 0.7 % (ref 0.0–1.5)
O2 Saturation: 66.3 %
Total hemoglobin: 9 g/dL — ABNORMAL LOW (ref 12.0–16.0)

## 2020-04-02 LAB — GLUCOSE, CAPILLARY
Glucose-Capillary: 109 mg/dL — ABNORMAL HIGH (ref 70–99)
Glucose-Capillary: 117 mg/dL — ABNORMAL HIGH (ref 70–99)
Glucose-Capillary: 117 mg/dL — ABNORMAL HIGH (ref 70–99)
Glucose-Capillary: 118 mg/dL — ABNORMAL HIGH (ref 70–99)
Glucose-Capillary: 131 mg/dL — ABNORMAL HIGH (ref 70–99)
Glucose-Capillary: 144 mg/dL — ABNORMAL HIGH (ref 70–99)

## 2020-04-02 MED ORDER — METOCLOPRAMIDE HCL 5 MG/ML IJ SOLN
10.0000 mg | Freq: Four times a day (QID) | INTRAMUSCULAR | Status: AC
  Administered 2020-04-02 – 2020-04-04 (×3): 10 mg via INTRAVENOUS
  Filled 2020-04-02 (×3): qty 2

## 2020-04-02 MED ORDER — FUROSEMIDE 40 MG PO TABS
40.0000 mg | ORAL_TABLET | Freq: Every day | ORAL | Status: DC
  Administered 2020-04-03 – 2020-04-05 (×3): 40 mg
  Filled 2020-04-02 (×5): qty 1

## 2020-04-02 MED FILL — Heparin Sodium (Porcine) Inj 1000 Unit/ML: INTRAMUSCULAR | Qty: 20 | Status: AC

## 2020-04-02 MED FILL — Calcium Chloride Inj 10%: INTRAVENOUS | Qty: 10 | Status: AC

## 2020-04-02 MED FILL — Mannitol IV Soln 20%: INTRAVENOUS | Qty: 500 | Status: AC

## 2020-04-02 MED FILL — Sodium Chloride IV Soln 0.9%: INTRAVENOUS | Qty: 2000 | Status: AC

## 2020-04-02 MED FILL — Albumin, Human Inj 5%: INTRAVENOUS | Qty: 250 | Status: AC

## 2020-04-02 MED FILL — Electrolyte-R (PH 7.4) Solution: INTRAVENOUS | Qty: 4000 | Status: AC

## 2020-04-02 MED FILL — Lidocaine HCl Local Soln Prefilled Syringe 100 MG/5ML (2%): INTRAMUSCULAR | Qty: 5 | Status: AC

## 2020-04-02 MED FILL — Sodium Bicarbonate IV Soln 8.4%: INTRAVENOUS | Qty: 50 | Status: AC

## 2020-04-02 NOTE — Progress Notes (Signed)
Physical Therapy Treatment Patient Details Name: Connor [name] MRN: [id number] DOB: June 23, 1946 Today's Date: 04/02/2020    History of Present Illness Patient is a 74 y/o male who presents with SOB. Admitted with new onset of CHF. Also with bilateral pleural effusions. s/p CABG x3 6/4. 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    PT Comments    Pt able to sit to stand with decreased assistance today, requiring minimal verbal cues to maintain sternal precautions. Pt able to amb further today with spo2 >92% on RA VSS. Pt reports he has been completing incentive spirometry and LE therex during the day, reviewed LE therex during session. Will continue to follow acutely until d/c to next venue of care.    Follow Up Recommendations  Home health PT;Supervision - Intermittent     Equipment Recommendations  Other (comment) (tbd)    Recommendations for Other Services       Precautions / Restrictions Precautions Precautions: Fall;Sternal Precaution Comments: hx of orthostatic hypotension, reviewed "move in the tube" and sternal precautions    Mobility  Bed Mobility               General bed mobility comments: In recliner upon room entry  Transfers Overall transfer level: Needs assistance Equipment used: Rolling walker (2 wheeled) Transfers: Sit to/from Stand Sit to Stand: Supervision         General transfer comment: Pt required supervision for sit to stand with verbal cues and reeeducation for sternal precautions  Ambulation/Gait Ambulation/Gait assistance: Min guard Gait Distance (Feet): 640 Feet Assistive device: Rolling walker (2 wheeled) Gait Pattern/deviations: Step-through pattern;Decreased stride length;Wide base of support Gait velocity: decreased   General Gait Details: Slowed gait, with no reports of fatigue, pt Spo2 remained >92% on RA, pt used RW and reports he has one at home,  may try without RW next time?   Stairs             Wheelchair Mobility    Modified Rankin (Stroke Patients Only)       Balance Overall balance assessment: Needs assistance;History of Falls Sitting-balance support: Feet supported;No upper extremity supported Sitting balance-Leahy Scale: Good     Standing balance support: During functional activity;Bilateral upper extremity supported Standing balance-Leahy Scale: Fair Standing balance comment: Pt able to stand up and then grab onto RW with transfers, pt does not require RW to maintain balance, but would require RW for balance with mobility.                            Cognition Arousal/Alertness: Awake/alert Behavior During Therapy: WFL for tasks assessed/performed Overall Cognitive Status: Within Functional Limits for tasks assessed                                        Exercises Total Joint Exercises Ankle Circles/Pumps: AROM;10 reps;Both;Seated Hip ABduction/ADduction: AROM;10 reps;Both;Seated Long Arc Quad: AROM;10 reps;Both;Seated General Exercises - Lower Extremity Hip Flexion/Marching: AROM;10 reps;Both;Seated    General Comments General comments (skin integrity, edema, etc.): Pt VSS during session, sternal incision WNL. Pt reports he has been completing LE therex and incentive spirometry as educated last session. BP 122/71 end of session.       Pertinent Vitals/Pain Pain Assessment: No/denies pain    Home Living  Prior Function            PT Goals (current goals can now be found in the care plan section) Acute Rehab PT Goals Patient Stated Goal: to be able to swallow, get stronger and go home PT Goal Formulation: With patient Time For Goal Achievement: 04/13/20 Potential to Achieve Goals: Good Progress towards PT goals: Progressing toward goals    Frequency    Min 3X/week      PT Plan Current plan remains appropriate     Co-evaluation              AM-PAC PT "6 Clicks" Mobility   Outcome Measure  Help needed turning from your back to your side while in a flat bed without using bedrails?: None Help needed moving from lying on your back to sitting on the side of a flat bed without using bedrails?: A Little Help needed moving to and from a bed to a chair (including a wheelchair)?: A Little Help needed standing up from a chair using your arms (e.g., wheelchair or bedside chair)?: None Help needed to walk in hospital room?: A Little Help needed climbing 3-5 steps with a railing? : A Little 6 Click Score: 20    End of Session Equipment Utilized During Treatment: Gait belt Activity Tolerance: Patient tolerated treatment well Patient left: in chair;with call bell/phone within reach Nurse Communication: Mobility status;Other (comment) (Resuming feeding tube) PT Visit Diagnosis: Muscle weakness (generalized) (M62.81);Difficulty in walking, not elsewhere classified (R26.2)     Time: 9093-1121 PT Time Calculation (min) (ACUTE ONLY): 27 min  Charges:  $Therapeutic Exercise: 23-37 mins                     [employer] SPT 04/02/2020    Rolland Porter 04/02/2020, 9:50 AM

## 2020-04-02 NOTE — Progress Notes (Signed)
CARDIAC REHAB PHASE I   PRE:  Rate/Rhythm: 76 SR    BP: sitting 104/61    SaO2: 95 RA  MODE:  Ambulation: 710 ft   POST:  Rate/Rhythm: 90 SR    BP: sitting 139/73     SaO2: 96 RA  Pt feeling well, happy to have water. Able to get out of bed independently and ambulate with RW. Steady, slow pace. Increased distance. No major c/o. To recliner. Encouraged x1 more walk and IS. [id number]   Darrick Meigs CES, ACSM 04/02/2020 2:04 PM

## 2020-04-02 NOTE — Progress Notes (Signed)
Modified Barium Swallow Progress Note  Patient Details  Name: Connor [name] MRN: [id number] Date of Birth: 1945/12/11  Today's Date: 04/02/2020  Modified Barium Swallow completed.  Full report located under Chart Review in the Imaging Section.  Brief recommendations include the following:  Clinical Impression  Pt demonstrates ongoing moderate to severe dysphagia, likely close to baseline function per pt report. He has anatomical and senory changes to his oral musculature, leading to mild anterior spillage and residue in the buccal cavities. Pt does make and effort to clear this, but at time times it spills posteriorally post swallow. Oropharyngeal mechanism is impaired with early sustained glottic closure with laryngeal elevation as a baseline adapted strategy. However, there is limited epiglottic deflection and approximation of aryteniod to epiglottis resulting in penetration to the false folds during swallow. There is limited base of tongue retraction and hyoid burst with piecemeal propulsion of bolus into the restricted UES. Pt uses at least two hyoid bursts to transit a bolus, but when elevation is released the remaining residue and penetrate is aspirated post swallow with sensation. Pt has a soft reflexive cough, but with encouragement, increased this to a strong "bark" cough that ejected aspirate, followed by a second swallow. Nectar increased residue and penetrate. Puree required a liquid wash to clear (positional strategies not beneficial). Liquid wash is aspirated.   Again, suspect this is near pts baseline and that he aspirates significantly PTA. He also reports oral intake is effortful, laborious and fatiguing at times. At this time, recommend pt start drinking plain water with his recommended strategies with ongoing use of Cortrak for nutrition. Discussed plan with Dr Prescott Gum who agrees, but would like to revisit plan after the weekend to determine d/c home diet. May need to  consider ongoing use of supplemental nutrition to limit oral intake to comfort/therapeutic trials after d/c until pt rehabilitates physically.    Swallow Evaluation Recommendations       SLP Diet Recommendations: Thin liquid (water only)   Liquid Administration via: Cup   Medication Administration: Via alternative means               Oral Care Recommendations: Oral care QID   Other Recommendations: Have oral suction available    Natalee Tomkiewicz, Katherene Ponto 04/02/2020,12:25 PM

## 2020-04-02 NOTE — Progress Notes (Addendum)
[address]       [place], [address]             [phone]      6 Days Post-Op Procedure(s) (LRB): CORONARY ARTERY BYPASS GRAFTING (CABG) x Three, using left internal mammary artery and right leg greater saphenous vein harvested endoscopically (N/A) TRANSESOPHAGEAL ECHOCARDIOGRAM (TEE) (N/A) Subjective: Patient is frustrated this morning. He just wants to go home and drink ensure ans coffee like he did before. He states that his swallowing will not get better and he feels like its the same now as it was pre-op.   Objective: Vital signs in last 24 hours: Temp:  [97.3 F (36.3 C)-97.6 F (36.4 C)] 97.5 F (36.4 C) (06/10 0344) Pulse Rate:  [69-74] 72 (06/10 0300) Cardiac Rhythm: Normal sinus rhythm (06/09 1900) Resp:  [13-17] 13 (06/10 0300) BP: (94-118)/(59-75) 107/71 (06/09 2357) SpO2:  [91 %-97 %] 94 % (06/10 0300) Weight:  [85.4 kg] 85.4 kg (06/10 0625)     Intake/Output from previous day: 06/09 0701 - 06/10 0700 In: 1911.9 [I.V.:1222.9; NG/GT:689] Out: 425 [Urine:425] Intake/Output this shift: No intake/output data recorded.  General appearance: alert, cooperative and no distress Heart: regular rate and rhythm, S1, S2 normal, no murmur, click, rub or gallop Lungs: clear to auscultation bilaterally Abdomen: soft, non-tender; bowel sounds normal; no masses,  no organomegaly Extremities: extremities normal, atraumatic, no cyanosis or edema Wound: clean and dry  Lab Results: Recent Labs    04/01/20 0420 04/02/20 0413  WBC 7.8 7.4  HGB 8.6* 8.9*  HCT 27.3* 28.3*  PLT 237 260   BMET:  Recent Labs    04/01/20 0420 04/02/20 0413  NA 137 136  K 3.4* 3.4*  CL 100 98  CO2 29 29  GLUCOSE 103* 137*  BUN 20 23  CREATININE 1.04 1.04  CALCIUM 8.8* 8.6*    PT/INR: No results for input(s): LABPROT, INR in the last 72 hours. ABG    Component Value Date/Time   PHART 7.398 03/28/2020 0519   HCO3 24.8 03/28/2020 0519   TCO2 26 03/28/2020  0519   ACIDBASEDEF 1.0 03/27/2020 2300   O2SAT 66.3 04/02/2020 0413   CBG (last 3)  Recent Labs    04/01/20 1929 04/01/20 2357 04/02/20 0354  GLUCAP 103* 117* 131*    Assessment/Plan: S/P Procedure(s) (LRB): CORONARY ARTERY BYPASS GRAFTING (CABG) x Three, using left internal mammary artery and right leg greater saphenous vein harvested endoscopically (N/A) TRANSESOPHAGEAL ECHOCARDIOGRAM (TEE) (N/A)  1. CV-NSR in the 70s, BP stable. Continue ASA, coreg, and crestor.  2. Pulm-CXR reviewed, stable.  3. NPO, Cortrak advanced with good positioning confirmed by abdominal xray. Starting TF slow and continue fluids for now until closer to goal.  5. H and H 8.9/28.3, expected acute blood loss anemia 6. No anticoagulation for this patient or ASA > 81. Hx of bleeding  Plan: Continue to titrate TF as tolerated. Cortrak now in good position. Encouraged ambulation. Patient asking SLP to come back and evaluate him and do another swallow study.    LOS: 12 days    Elgie Collard 04/02/2020  Patient progressing after CABG for ischemic cardiomyopathy and heart failure. Remove epicardial pacing wires tomorrow Patient will continue tube feeds until Monday drinking water only.  Follow-up swallow assessment Monday by speech therapy to set up home diet.  Patient will not go home with feeding tube in place.    patient examined and medical record reviewed,agree with above note.  Tharon Aquas Trigt III 04/02/2020

## 2020-04-02 NOTE — Plan of Care (Signed)
  Problem: Education: Goal: Ability to demonstrate management of disease process will improve Outcome: Progressing Goal: Ability to verbalize understanding of medication therapies will improve Outcome: Progressing Goal: Individualized Educational Video(s) Outcome: Progressing   Problem: Education: Goal: Ability to demonstrate management of disease process will improve Outcome: Progressing Goal: Ability to verbalize understanding of medication therapies will improve Outcome: Progressing Goal: Individualized Educational Video(s) Outcome: Progressing

## 2020-04-03 ENCOUNTER — Inpatient Hospital Stay (HOSPITAL_COMMUNITY): Payer: PPO

## 2020-04-03 LAB — GLUCOSE, CAPILLARY
Glucose-Capillary: 105 mg/dL — ABNORMAL HIGH (ref 70–99)
Glucose-Capillary: 115 mg/dL — ABNORMAL HIGH (ref 70–99)
Glucose-Capillary: 119 mg/dL — ABNORMAL HIGH (ref 70–99)
Glucose-Capillary: 119 mg/dL — ABNORMAL HIGH (ref 70–99)

## 2020-04-03 LAB — BASIC METABOLIC PANEL
Anion gap: 11 (ref 5–15)
BUN: 24 mg/dL — ABNORMAL HIGH (ref 8–23)
CO2: 30 mmol/L (ref 22–32)
Calcium: 8.5 mg/dL — ABNORMAL LOW (ref 8.9–10.3)
Chloride: 95 mmol/L — ABNORMAL LOW (ref 98–111)
Creatinine, Ser: 1 mg/dL (ref 0.61–1.24)
GFR calc Af Amer: >60 mL/min (ref >60)
GFR calc non Af Amer: >60 mL/min (ref >60)
Glucose, Bld: 120 mg/dL — ABNORMAL HIGH (ref 70–99)
Potassium: 3.7 mmol/L (ref 3.5–5.1)
Sodium: 136 mmol/L (ref 135–145)

## 2020-04-03 LAB — CBC
HCT: 27.8 % — ABNORMAL LOW (ref 39.0–52.0)
Hemoglobin: 8.7 g/dL — ABNORMAL LOW (ref 13.0–17.0)
MCH: 27.4 pg (ref 26.0–34.0)
MCHC: 31.3 g/dL (ref 30.0–36.0)
MCV: 87.4 fL (ref 80.0–100.0)
Platelets: 268 10*3/uL (ref 150–400)
RBC: 3.18 MIL/uL — ABNORMAL LOW (ref 4.22–5.81)
RDW: 14.7 % (ref 11.5–15.5)
WBC: 7.6 10*3/uL (ref 4.0–10.5)
nRBC: 0 % (ref 0.0–0.2)

## 2020-04-03 NOTE — Progress Notes (Addendum)
  Speech Language Pathology Treatment: Dysphagia  Patient Details Name: Connor [name] MRN: [id number] DOB: 02-01-1946 Today's Date: 04/03/2020 Time: 4656-8127 SLP Time Calculation (min) (ACUTE ONLY): 15 min  Assessment / Plan / Recommendation Clinical Impression  P reports tolerating his water and using the recommended strategies. He demonstrates this with 100% accuracy. We had a good conversation with pt participating in reasoning and medical decision making. He reports he has struggled to maintain his nutrition for years and recent mandible surgery has made this even more complex. Now, with his prostate cancer and recovery from CABG, he really wants a PEG tube for consistent nutrition without risk of aspiration pna from his chronic dysphagia. He also reports that his ability to perform his complex oral hygiene routine to wash out the area round his mandible and prevent infection has been hindered here. Oral bacteria poses a huge risk to his heart and lung health.   I agree with pts rationale for long term method of nutrition to simplify his self care and reduce risk of aspiration. 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 Pt also has discussed with Dr. Prescott Gum, who, per his note is in agreement. Will f/u on Monday for further education and needs at plans for PEG tube roll out.     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  He has a hx of dysphagia with MBS on 10/01/2011.  Most recent recommendations for Dysphagia 3 solids and thin liquids with use of strategies. Pt has a hx of CVA and head/neck cancer.        SLP Plan  Continue with current plan of care       Recommendations  Diet recommendations: Thin liquid (water only) Liquids provided via: Cup                Plan: Continue with current plan of care       GO               Herbie [place], MA [place] Pager [phone] Office [phone]  Lynann Beaver 04/03/2020, 11:36 AM

## 2020-04-03 NOTE — Progress Notes (Addendum)
[address]       [place] [address]             [phone]      7 Days Post-Op Procedure(s) (LRB): CORONARY ARTERY BYPASS GRAFTING (CABG) x Three, using left internal mammary artery and right leg greater saphenous vein harvested endoscopically (N/A) TRANSESOPHAGEAL ECHOCARDIOGRAM (TEE) (N/A) Subjective: Frustrated this morning. He does not want to stay the weekend and is asking for a "belly tube" and just to stay on water PO.   Objective: Vital signs in last 24 hours: Temp:  [97.6 F (36.4 C)-97.9 F (36.6 C)] 97.9 F (36.6 C) (06/11 0400) Pulse Rate:  [72-79] 77 (06/11 0400) Cardiac Rhythm: Normal sinus rhythm (06/11 0739) Resp:  [14-19] 19 (06/11 0400) BP: (104-122)/(60-71) 104/70 (06/11 0400) SpO2:  [93 %-100 %] 100 % (06/11 0400) Weight:  [86.2 kg] 86.2 kg (06/11 0542)     Intake/Output from previous day: 06/10 0701 - 06/11 0700 In: 1379.8 [P.O.:100; I.V.:13; NG/GT:1266.8] Out: 800 [Urine:800] Intake/Output this shift: No intake/output data recorded.  General appearance: alert, cooperative and no distress Heart: regular rate and rhythm, S1, S2 normal, no murmur, click, rub or gallop Lungs: clear to auscultation bilaterally Abdomen: soft, non-tender; bowel sounds normal; no masses,  no organomegaly Extremities: extremities normal, atraumatic, no cyanosis or edema Wound: clean and dry  Lab Results: Recent Labs    04/02/20 0413 04/03/20 0427  WBC 7.4 7.6  HGB 8.9* 8.7*  HCT 28.3* 27.8*  PLT 260 268   BMET:  Recent Labs    04/02/20 0413 04/03/20 0427  NA 136 136  K 3.4* 3.7  CL 98 95*  CO2 29 30  GLUCOSE 137* 120*  BUN 23 24*  CREATININE 1.04 1.00  CALCIUM 8.6* 8.5*    PT/INR: No results for input(s): LABPROT, INR in the last 72 hours. ABG    Component Value Date/Time   PHART 7.398 03/28/2020 0519   HCO3 24.8 03/28/2020 0519   TCO2 26 03/28/2020 0519   ACIDBASEDEF 1.0 03/27/2020 2300   O2SAT 66.3 04/02/2020 0413    CBG (last 3)  Recent Labs    04/02/20 2353 04/03/20 0415 04/03/20 0737  GLUCAP 118* 115* 119*    Assessment/Plan: S/P Procedure(s) (LRB): CORONARY ARTERY BYPASS GRAFTING (CABG) x Three, using left internal mammary artery and right leg greater saphenous vein harvested endoscopically (N/A) TRANSESOPHAGEAL ECHOCARDIOGRAM (TEE) (N/A)  1. CV-NSR in the 70s, BP stable. Continue ASA, coreg, and crestor.  2. Pulm-CXR reviewed, stable.  3. NPO, Cortrak advanced with good positioning confirmed by abdominal xray. TF at 64ml/hr 5. H and H 8.7/27.8, expected acute blood loss anemia 6. No anticoagulation for this patient or ASA > 81. Hx of bleeding  Plan: Continue TF until Monday. Follow-up swallow to be done Monday by SLP. Home once feeding tube can be removed. The patient prefers a J-tube be placed and to go home on TF and water PO. He does not want to wait until Monday because he doesn't think his swallow function will improve.    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 I have discussed the situation in detail with both the patient as well as his speech pathologist Horris Latino and Jamison Oka PA-C. His swallow function was very poor prior to admission and he had  difficulty maintaining adequate nutrition. I do not feel that it is safe to discharge the patient with a nasal feeding tube-core track The best short and long-term reality would be to place a feeding gastrostomy by IR prior to discharge so the patient will receive enough nutrition to recover from his heart surgery as well as to provide him a safe route for future nutrition. Patient will stay in the hospital on core track tube feedings until evaluated by IR for placement of a percutaneous gastrostomy.  patient examined and medical record reviewed,agree with above  note. Tharon Aquas Trigt III 04/03/2020

## 2020-04-03 NOTE — Progress Notes (Signed)
CARDIAC REHAB PHASE I   PRE:  Rate/Rhythm: 72 SR    BP: sitting 106/60    SaO2: 100 RA  MODE:  Ambulation: 410 ft   POST:  Rate/Rhythm: 80 SR    BP: sitting 114/69     SaO2: 94 RA  Pt eager to ambulate. Able to get out of bed and ambulate with RW independently however admits to soreness and weakness. He wants to be walking more. To recliner after walk, practiced IS. Will f/u tomorrow. [place], ACSM 04/03/2020 1:39 PM

## 2020-04-04 LAB — GLUCOSE, CAPILLARY: Glucose-Capillary: 119 mg/dL — ABNORMAL HIGH (ref 70–99)

## 2020-04-04 NOTE — Progress Notes (Addendum)
[address]       [place] [address]             [phone]      8 Days Post-Op Procedure(s) (LRB): CORONARY ARTERY BYPASS GRAFTING (CABG) x Three, using left internal mammary artery and right leg greater saphenous vein harvested endoscopically (N/A) TRANSESOPHAGEAL ECHOCARDIOGRAM (TEE) (N/A) Subjective: Feels fine  Objective: Vital signs in last 24 hours: Temp:  [97.1 F (36.2 C)-98.1 F (36.7 C)] 98.1 F (36.7 C) (06/12 0853) Pulse Rate:  [67-73] 73 (06/12 0853) Cardiac Rhythm: Normal sinus rhythm (06/12 0853) Resp:  [16-20] 16 (06/12 0853) BP: (99-113)/(56-68) 99/60 (06/12 0853) SpO2:  [91 %-98 %] 91 % (06/12 0853) Weight:  [88.1 kg] 88.1 kg (06/12 0500)  Hemodynamic parameters for last 24 hours:    Intake/Output from previous day: 06/11 0701 - 06/12 0700 In: 960 [P.O.:360; NG/GT:500; IV Piggyback:100] Out: 400 [Urine:400] Intake/Output this shift: No intake/output data recorded.  General appearance: alert, cooperative and no distress Heart: regular rate and rhythm Lungs: clear to auscultation bilaterally Abdomen: benign Extremities: no edema Wound: incis healing well  Lab Results: Recent Labs    04/02/20 0413 04/03/20 0427  WBC 7.4 7.6  HGB 8.9* 8.7*  HCT 28.3* 27.8*  PLT 260 268   BMET:  Recent Labs    04/02/20 0413 04/03/20 0427  NA 136 136  K 3.4* 3.7  CL 98 95*  CO2 29 30  GLUCOSE 137* 120*  BUN 23 24*  CREATININE 1.04 1.00  CALCIUM 8.6* 8.5*    PT/INR: No results for input(s): LABPROT, INR in the last 72 hours. ABG    Component Value Date/Time   PHART 7.398 03/28/2020 0519   HCO3 24.8 03/28/2020 0519   TCO2 26 03/28/2020 0519   ACIDBASEDEF 1.0 03/27/2020 2300   O2SAT 66.3 04/02/2020 0413   CBG (last 3)  Recent Labs    04/03/20 1125 04/03/20 2339 04/04/20 0425  GLUCAP 105* 119* 119*    Meds Scheduled Meds: . aspirin  81 mg Per Tube Daily  . bisacodyl  10 mg Oral Daily   Or  . bisacodyl  10 mg  Rectal Daily  . carvedilol  6.25 mg Oral BID WC  . chlorhexidine gluconate (MEDLINE KIT)  15 mL Mouth Rinse BID  . Chlorhexidine Gluconate Cloth  6 each Topical Daily  . docusate  200 mg Oral Daily  . feeding supplement (PRO-STAT SUGAR FREE 64)  30 mL Per Tube BID  . furosemide  40 mg Per Tube Daily  . lamoTRIgine  100 mg Oral q AM  . lamoTRIgine  200 mg Oral QPM  . mouth rinse  15 mL Mouth Rinse 10 times per day  . pantoprazole sodium  40 mg Per Tube Daily  . polyethylene glycol  17 g Oral Daily  . rosuvastatin  20 mg Oral Daily  . sodium chloride flush  10-40 mL Intracatheter Q12H  . sodium chloride flush  3 mL Intravenous Q12H  . sorbitol  30 mL Per Tube Once  . sorbitol  60 mL Per Tube Once   Continuous Infusions: . sodium chloride Stopped (03/29/20 1634)  . sodium chloride Stopped (03/28/20 1740)  . sodium chloride 20 mL/hr at 03/31/20 0800  . feeding supplement (VITAL 1.5 CAL) 50 mL/hr at 04/03/20 2100  . lactated ringers 20 mL/hr at 03/28/20 0542  . levofloxacin (LEVAQUIN) IV 500 mg (04/03/20 1551)   PRN Meds:.sodium chloride, ondansetron (ZOFRAN) IV, oxyCODONE, sodium chloride  flush, sodium chloride flush, traMADol  Xrays CT ABDOMEN WO CONTRAST  Result Date: 04/04/2020 CLINICAL DATA:  Evaluate gastric anatomy prior to potential percutaneous gastrostomy tube placement. EXAM: CT ABDOMEN WITHOUT CONTRAST TECHNIQUE: Multidetector CT imaging of the abdomen was performed following the standard protocol without IV contrast. COMPARISON:  CT abdomen pelvis-04/04/2017 FINDINGS: The lack of intravenous contrast limits the ability to evaluate solid abdominal organs Lower chest: Limited visualization of the lower thorax demonstrates small/trace bilateral effusions, right greater than left, with associated bibasilar consolidative opacities, atelectasis versus infiltrate. Note is made of a small right basilar pleural calcification. Normal heart size. Trace amount of pericardial fluid, likely  postoperative in etiology. Extensive calcifications within native coronary arteries. Calcifications of the mitral valve annulus. Hepatobiliary: Normal hepatic contour. Normal appearance of the gallbladder given underdistention. No ascites. Pancreas: Pancreas is largely fatty replaced. Spleen: Normal appearance of the spleen. Adrenals/Urinary Tract: Post left-sided nephrectomy. Hypertrophy of the right kidney with accentuated fetal lobulation as better demonstrated on contrast-enhanced abdominal CT performed 04/04/2017. No evidence of right-sided nephrolithiasis or urinary obstruction. Normal appearance the bilateral adrenal glands. The urinary bladder was not imaged. Stomach/Bowel: The stomach is fairly well apposed against the ventral wall of the upper abdomen, and the percutaneous window will likely be improved with gastric insufflation. Enteric tube tip terminates within the horizontal segment of the duodenum. Enteric contrast is seen throughout the colon. No evidence of enteric obstruction. No pneumoperitoneum, pneumatosis or portal venous gas. Vascular/Lymphatic: Atherosclerotic plaque within a normal caliber abdominal aorta. Other: Ill-defined stranding about the midline sternotomy, likely postoperative in etiology. Subcutaneous tracks within the ventral aspect of the upper abdomen likely represent the location of previous mediastinal drains (representative image 24, series 3). Musculoskeletal: No acute or aggressive osseous abnormalities. 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 IMPRESSION: 1. Gastric anatomy amenable to potential percutaneous gastrostomy tube placement as indicated. 2. Small/trace bilateral effusions, right greater than left. 3. Sequela of previous left nephrectomy. 4. Aortic Atherosclerosis (ICD10-I70.0). Electronically Signed   By: Sandi Mariscal M.D.    On: 04/04/2020 04:16    Assessment/Plan: S/P Procedure(s) (LRB): CORONARY ARTERY BYPASS GRAFTING (CABG) x Three, using left internal mammary artery and right leg greater saphenous vein harvested endoscopically (N/A) TRANSESOPHAGEAL ECHOCARDIOGRAM (TEE) (N/A)  1 doing well 2 stable vitals, afebrile 3 sats ok on RA 4 no new labs 5 awaits IR placed G tube d/t significant swallowing dysfunction- conts TF's  LOS: 14 days    John Giovanni PA-C Pager [phone] 04/04/2020  Patient seen and examined, agree with above  Remo Lipps C. Roxan Hockey, MD [facility] [phone]

## 2020-04-04 NOTE — Progress Notes (Signed)
CARDIAC REHAB PHASE I   PRE:  Rate/Rhythm: 72 SR    BP: sitting 102/66    SaO2: 92 RA  MODE:  Ambulation: 640 ft   POST:  Rate/Rhythm: 80 SR    BP: sitting 115/77     SaO2: 94 RA  Pt got up independently and walked with RW. Unsteady for few steps in room without RW. Pt wants to walk more. Showed his girlfriend how to push IV pole so pt could ambulate more (staff will need to set up monitor/IV pole/etc). To recliner, practiced IS. Will f/u Monday. Doing well. [place], ACSM 04/04/2020 1:56 PM

## 2020-04-04 NOTE — Consult Note (Addendum)
Chief Complaint: Gastrostomy tube placement due to dysphagia.   Referring Physician(s): Dr. Nils Pyle  Supervising Physician: Daryll Brod  Patient Status: [facility] - In-pt  History of Present Illness: Connor [name] is a 74 y.o. 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 Post surgical the patient was found to have a poor swallow function. Team is requesting gastrostomy tube placement for ongoing nutrition access. Patient currently has a coretrak in place.   Past Medical History:  Diagnosis Date  . Arthritis    OA AND PAIN RT KNEE  . Cancer ([place])    h/o neck - ABOUT 6 YRS AGO - TX'D WITH RADIATION AND CHEMO   . Chronic kidney disease   . GERD without esophagitis 03/21/2020  . Head and neck cancer ~ 2009   S/P radiation & [name]  . Headache(784.0)   . Hyperlipidemia   . Hypertension   . Kidney carcinoma ([place])    h/o - NEPHRECTOMY   . 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 Prostate cancer ([place]) 05/20/14   Gleason 4+3=7, volume 25 gm  . Radiation 2015   hx of, prostate cancer  . Seizures ([place])    hx of x yrs, "the bad kind;bite tongue; STATES LAST [place] 2013; WAS SEEING [name] - HE RETIRED AND PT LAST SAW DR. [name]  . Stroke ([place]) [place] 2013   UNABLE TO SPEAK OR MOVE AND RT SIDE WEAKNESS AND LOSS OF SKIN SENSITIVITY TO HEAT AND COLD ON RT SIDE, DOUBLE VISION. BALANCE PROBLEMS---STATES STILL HAS DOUBLE VISION AND BALANCE PROBLEM AND RT SIDED LOSS OF SKIN SENSITIVITY    Past Surgical History:  Procedure Laterality Date  . CORONARY ARTERY BYPASS GRAFT N/A 03/27/2020   Procedure: CORONARY ARTERY BYPASS GRAFTING (CABG) x Three, using left internal mammary artery  and right leg greater saphenous vein harvested endoscopically;  Surgeon: Ivin Poot, MD;  Location: [place];  Service: Open Heart Surgery;  Laterality: N/A;  . hydrocelectomy  11/2000   left  . IR FLUORO GUIDE CV LINE RIGHT  08/31/2017  . IR US GUIDE VASC ACCESS RIGHT  08/31/2017  . JOINT REPLACEMENT     LEFT TOTAL KNEE ARTHROPLASTY  . MOUTH SURGERY  2019  . NEPHRECTOMY  1990's   left  . PROSTATE BIOPSY  05/20/14   Gleason 4+3=7, vol 25 gm  . RIGHT/LEFT HEART CATH AND CORONARY ANGIOGRAPHY N/A 03/24/2020   Procedure: RIGHT/LEFT HEART CATH AND CORONARY ANGIOGRAPHY;  Surgeon: Burnell Blanks, MD;  Location: [place] [facility];  Service: Cardiovascular;  Laterality: N/A;  . TEE WITHOUT CARDIOVERSION  10/04/2011   Procedure: TRANSESOPHAGEAL ECHOCARDIOGRAM (TEE);  Surgeon: Candee Furbish, MD;  Location: [facility] ENDOSCOPY;  Service: Cardiovascular;  Laterality: N/A;  . TEE WITHOUT CARDIOVERSION N/A 03/27/2020   Procedure: TRANSESOPHAGEAL ECHOCARDIOGRAM (TEE);  Surgeon: Prescott Gum, Collier Salina, MD;  Location: [place];  Service: Open Heart Surgery;  Laterality: N/A;  . TOTAL KNEE ARTHROPLASTY  2011   left  . TOTAL KNEE ARTHROPLASTY Right 02/24/2014   Procedure: RIGHT TOTAL KNEE ARTHROPLASTY;  Surgeon: Gearlean Alf, MD;  [facility]: [facility];  Service: Orthopedics;  Laterality: Right;    Allergies: Morphine and related  Medications: Prior to Admission medications   Medication Sig Start Date End Date Taking? Authorizing Provider  albuterol (VENTOLIN HFA) 108 (90 Base) MCG/ACT inhaler Inhale 2 puffs into the lungs in the morning, at noon, and at bedtime. 03/19/20  Yes [provider]  aspirin 81 MG tablet Take 81 mg by mouth daily.   Yes [provider]  Cyanocobalamin (VITAMIN B-12 PO) Take 1 tablet by mouth daily.   Yes [provider]  ferrous sulfate 325 (65 FE) MG tablet Take 325 mg by mouth 2 (two) times daily with a meal.   Yes [provider]  fluconazole (DIFLUCAN)  200 MG tablet Take 2 tablets (400 mg total) by mouth daily. 10/31/19  Yes Carlyle Basques, MD  fludrocortisone (FLORINEF) 0.1 MG tablet Take 100 mcg by mouth daily. 03/05/20  Yes [provider]  Leuprolide Acetate, 3 Month, (LUPRON DEPOT, 109-MONTH, IM) Inject 1 each into the muscle every 3 (three) months.   Yes [provider]  levofloxacin (LEVAQUIN) 500 MG tablet Take 1 tablet (500 mg total) by mouth daily. Patient taking differently: Take 500 mg by mouth every evening.  03/03/20  Yes Carlyle Basques, MD  simvastatin (ZOCOR) 20 MG tablet Take 20 mg by mouth every evening.   Yes [provider]  lamoTRIgine (LAMICTAL) 100 MG tablet TAKE 1 TABLET BY MOUTH IN THE MORNING AND 2 TABLET BY MOUTH IN THE EVENING. Must be seen for further refills. Call [phone] to schedule. 03/30/20   Penumalli, Earlean Polka, MD     Family History  Problem Relation Age of Onset  . Diabetes Mother   . Heart disease Mother   . Heart disease Father     Social History   Socioeconomic History  . Marital status: Divorced    Spouse name: Not on file  . Number of children: 2  . Years of education: 12th  . Highest education level: Not on file  Occupational History  . Occupation: Information systems manager: OTHER    Comment: [employer]  Tobacco Use  . Smoking status: Never Smoker  . Smokeless tobacco: Never Used  Vaping Use  . Vaping Use: Never used  Substance and Sexual Activity  . Alcohol use: Yes    Alcohol/week: 0.0 standard drinks    Comment: WAS 6 TO 8 BEERS DAILY   . Drug use: No  . Sexual activity: Not Currently  Other Topics Concern  . Not on file  Social History Narrative   Patient is divorced with 2 children.   Patient is right handed.   Patient has hs education.   Patient drinks 2 cups daily.   Social Determinants of Health   Financial Resource Strain:   . Difficulty of Paying Living Expenses:   Food Insecurity:   . Worried About Charity fundraiser in the Last  Year:   . Arboriculturist in the Last Year:   Transportation Needs:   . Film/video editor (Medical):   Marland Kitchen Lack of Transportation (Non-Medical):   Physical Activity:   . Days of Exercise per Week:   . Minutes of Exercise per Session:   Stress:   . Feeling of Stress :   Social Connections:   . Frequency of Communication with Friends and Family:   . Frequency of Social Gatherings with Friends and Family:   . Attends Religious Services:   . Active Member of Clubs or Organizations:   . Attends Archivist Meetings:   .  Marital Status:      Review of Systems: A 12 point ROS discussed and pertinent positives are indicated in the HPI above.  All other systems are negative.  Review of Systems  Constitutional: Negative for fever.  HENT: Positive for trouble swallowing. Negative for congestion.   Respiratory: Negative for cough and shortness of breath.   Cardiovascular: Negative for chest pain.  Gastrointestinal: Negative for abdominal pain.  Neurological: Negative for headaches.  Psychiatric/Behavioral: Negative for behavioral problems and confusion.    Vital Signs: BP 99/60 (BP Location: Left Arm)   Pulse 73   Temp 98.1 F (36.7 C) (Oral)   Resp 16   Ht 5\' 10"  (1.778 m)   Wt 194 lb 3.6 oz (88.1 kg)   SpO2 91%   BMI 27.87 kg/m   Physical Exam Vitals and nursing note reviewed.  Constitutional:      Appearance: He is well-developed.  HENT:     Head: Normocephalic.  Cardiovascular:     Rate and Rhythm: Normal rate and regular rhythm.     Heart sounds: Normal heart sounds.  Pulmonary:     Effort: Pulmonary effort is normal.     Breath sounds: Normal breath sounds.  Abdominal:     General: There is distension.  Musculoskeletal:        General: Normal range of motion.     Cervical back: Normal range of motion.  Skin:    General: Skin is dry.  Neurological:     Mental Status: He is alert and oriented to person, place, and time.     Imaging: CT ABDOMEN  WO CONTRAST  Result Date: 04/04/2020 CLINICAL DATA:  Evaluate gastric anatomy prior to potential percutaneous gastrostomy tube placement. EXAM: CT ABDOMEN WITHOUT CONTRAST TECHNIQUE: Multidetector CT imaging of the abdomen was performed following the standard protocol without IV contrast. COMPARISON:  CT abdomen pelvis-04/04/2017 FINDINGS: The lack of intravenous contrast limits the ability to evaluate solid abdominal organs Lower chest: Limited visualization of the lower thorax demonstrates small/trace bilateral effusions, right greater than left, with associated bibasilar consolidative opacities, atelectasis versus infiltrate. Note is made of a small right basilar pleural calcification. Normal heart size. Trace amount of pericardial fluid, likely postoperative in etiology. Extensive calcifications within native coronary arteries. Calcifications of the mitral valve annulus. Hepatobiliary: Normal hepatic contour. Normal appearance of the gallbladder given underdistention. No ascites. Pancreas: Pancreas is largely fatty replaced. Spleen: Normal appearance of the spleen. Adrenals/Urinary Tract: Post left-sided nephrectomy. Hypertrophy of the right kidney with accentuated fetal lobulation as better demonstrated on contrast-enhanced abdominal CT performed 04/04/2017. No evidence of right-sided nephrolithiasis or urinary obstruction. Normal appearance the bilateral adrenal glands. The urinary bladder was not imaged. Stomach/Bowel: The stomach is fairly well apposed against the ventral wall of the upper abdomen, and the percutaneous window will likely be improved with gastric insufflation. Enteric tube tip terminates within the horizontal segment of the duodenum. Enteric contrast is seen throughout the colon. No evidence of enteric obstruction. No pneumoperitoneum, pneumatosis or portal venous gas. Vascular/Lymphatic: Atherosclerotic plaque within a normal caliber abdominal aorta. Other: Ill-defined stranding about the  midline sternotomy, likely postoperative in etiology. Subcutaneous tracks within the ventral aspect of the upper abdomen likely represent the location of previous mediastinal drains (representative image 24, series 3). Musculoskeletal: No acute or aggressive osseous abnormalities. 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 IMPRESSION: 1. Gastric anatomy amenable  to potential percutaneous gastrostomy tube placement as indicated. 2. Small/trace bilateral effusions, right greater than left. 3. Sequela of previous left nephrectomy. 4. Aortic Atherosclerosis (ICD10-I70.0). Electronically Signed   By: Sandi Mariscal M.D.   On: 04/04/2020 04:16   DG Chest 2 View  Result Date: 03/21/2020 CLINICAL DATA:  Pneumonia EXAM: CHEST - 2 VIEW COMPARISON:  Chest CT January 05 2010 FINDINGS: There is airspace opacity in the right base with small right pleural effusion. There is a minimal left pleural effusion with slight left base atelectasis. Heart size and pulmonary vascularity normal. No adenopathy. There is degenerative change in the thoracic spine. IMPRESSION: Small pleural effusion on each side, larger on the right than on the left. Ill-defined airspace opacity in the right base, likely combination of atelectasis and a degree of potential pneumonia. Slight left base atelectasis noted. Cardiac silhouette within normal limits.  No adenopathy. Electronically Signed   By: Lowella Grip III M.D.   On: 03/21/2020 17:30   CT Angio Chest PE W and/or Wo Contrast  Result Date: 03/21/2020 CLINICAL DATA:  74 year old male with shortness of breath. History of prostate cancer. EXAM: CT ANGIOGRAPHY CHEST WITH CONTRAST TECHNIQUE: Multidetector CT imaging of the chest was performed using the standard protocol during bolus administration of intravenous contrast. Multiplanar CT image reconstructions and  MIPs were obtained to evaluate the vascular anatomy. CONTRAST:  120mL OMNIPAQUE IOHEXOL 350 MG/ML SOLN COMPARISON:  Chest radiograph dated 03/21/2020. FINDINGS: Cardiovascular: Top-normal cardiac size. No pericardial effusion. Advanced 3 vessel coronary vascular calcification. There is mild atherosclerotic calcification of the thoracic aorta. Evaluation of the aorta is limited due to non opacification and timing of the contrast. Evaluation of the pulmonary arteries is limited due to suboptimal opacification of the peripheral branches. No large or central pulmonary artery embolus identified. Mediastinum/Nodes: No hilar or mediastinal adenopathy. The esophagus is grossly unremarkable. No mediastinal fluid collection. Lungs/Pleura: Moderate right and small left pleural effusions with associated partial compressive atelectasis of the lower lobes. Pneumonia is not excluded. Clinical correlation is recommended. There is diffuse interstitial and interlobular septal prominence most consistent with edema. There is no pneumothorax. The central airways are patent. Upper Abdomen: Partially visualized air pocket adjacent to the stomach (144/5) is indeterminate but may represent air within a segment of the colon. An extraluminal air is not entirely excluded. Musculoskeletal: Degenerative changes of the spine. Sclerotic changes of the T3 and T4 slightly progressed since the CT of 01/06/2020. no acute osseous pathology. Review of the MIP images confirms the above findings. IMPRESSION: 1. No CT evidence of central pulmonary artery embolus. 2. Moderate right and small left pleural effusions with associated partial compressive atelectasis of the lower lobes. Pneumonia is not excluded. Clinical correlation is recommended. 3. Diffuse interstitial and interlobular septal prominence most consistent with edema. 4. Sclerotic changes of the T3 and T4 slightly progressed since the CT of 01/06/2020. 5. Partially visualized indeterminate air  pocket adjacent to the stomach. This may represent an intraluminal pocket of air, although pneumoperitoneum is not excluded. 6. Aortic Atherosclerosis (ICD10-I70.0). Electronically Signed   By: Anner Crete M.D.   On: 03/21/2020 19:53   CARDIAC CATHETERIZATION  Result Date: 03/24/2020  Ost RCA to Prox RCA lesion is 99% stenosed.  Prox RCA lesion is 80% stenosed.  Mid RCA to Dist RCA lesion is 20% stenosed.  Ost LAD to Prox LAD lesion is 80% stenosed.  Ost Cx to Prox Cx lesion is 50% stenosed.  1st Diag lesion is 70% stenosed.  Dist  LAD lesion is 30% stenosed.  Ost LM to Ost LAD lesion is 90% stenosed.  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 Elevated filling pressures consistent with continued volume overload. Recommendations: Will consult CT surgery for CABG. Will resume IV Lasix tonight.   DG Chest Port 1 View  Result Date: 04/01/2020 CLINICAL DATA:  Recent pneumothorax. Status post coronary artery bypass grafting EXAM: PORTABLE CHEST 1 VIEW COMPARISON:  March 31, 2020 FINDINGS: Currently, no pneumothorax is evident on either side. There is atelectatic change in the left base. Lungs otherwise are clear. Heart is upper normal in size with pulmonary vascularity normal. Feeding tube tip is below the diaphragm. Central catheter tip is in the superior vena cava. Patient is status post coronary artery bypass grafting. No adenopathy. No bone lesions. IMPRESSION: No pneumothorax evident currently. Mild left base atelectasis. Lungs otherwise clear. Stable cardiac silhouette. Tube and catheter positions as described. Electronically Signed   By: Lowella Grip III M.D.   On: 04/01/2020 09:07   DG Chest Port 1 View  Result Date: 03/31/2020 CLINICAL DATA:  Post CABG, dyspnea EXAM: PORTABLE CHEST 1 VIEW COMPARISON:  Chest radiograph from one day prior. FINDINGS: Intact sternotomy wires. Right PICC terminates in  middle third of the SVC. Enteric tube terminates in the proximal stomach. Interval removal of left chest tube. Stable cardiomediastinal silhouette with mild cardiomegaly. Small right apical pneumothorax, less than 5%, not definitely changed accounting for differences in projection. Small left apical pneumothorax, less than 5%, not appreciably changed. No pleural effusion. No overt pulmonary edema. Similar mild bibasilar atelectasis. IMPRESSION: 1. Stable small bilateral apical pneumothoraces, both less than 5%. 2. Stable mild cardiomegaly without overt pulmonary edema. 3. Similar mild bibasilar atelectasis. Electronically Signed   By: Ilona Sorrel M.D.   On: 03/31/2020 09:48   DG Chest Port 1 View  Result Date: 03/30/2020 CLINICAL DATA:  CABG EXAM: PORTABLE CHEST 1 VIEW COMPARISON:  03/29/2020 FINDINGS: Bilateral chest tubes remain in place. Small left apical pneumothorax. No pneumothorax on the right. Cardiomegaly. Prior CABG. Bibasilar atelectasis. Suspect small left effusion. IMPRESSION: Bilateral chest tubes.  Small left apical pneumothorax. Left base atelectasis with probable small effusion. Electronically Signed   By: Rolm Baptise M.D.   On: 03/30/2020 08:19   DG Chest Port 1 View  Result Date: 03/29/2020 CLINICAL DATA:  History of CABG. EXAM: PORTABLE CHEST 1 VIEW COMPARISON:  03/28/2020 FINDINGS: Swan-Ganz catheter has been removed. RIGHT IJ sheath remains in place. Bilateral chest tubes and mediastinal drains remain in place. Status post median sternotomy and CABG. Stable cardiomegaly. There has been some improvement in aeration at the LEFT lung base. Atelectasis or consolidation remain at the LEFT lung base. Stable LEFT apical pleural thickening or pleural effusion. There is no pneumothorax. IMPRESSION: Improved aeration at the LEFT lung base. Electronically Signed   By: Nolon Nations M.D.   On: 03/29/2020 09:10   DG Chest Port 1 View  Result Date: 03/28/2020 CLINICAL DATA:  Dizziness and  pneumonia. EXAM: PORTABLE CHEST 1 VIEW COMPARISON:  03/27/2020 FINDINGS: Endotracheal tube has been removed. Nasogastric tube has been removed. RIGHT IJ Swan-Ganz catheter tip overlies the level of the pulmonary outflow tract, stable in appearance. Bilateral chest tubes are unchanged in position. No pneumothorax. Bibasilar opacities persist, LEFT greater than RIGHT, and are stable. IMPRESSION: Interval removal of endotracheal tube and nasogastric tube. Stable bibasilar opacities, LEFT greater than RIGHT. Electronically Signed  By: Nolon Nations M.D.   On: 03/28/2020 08:42   DG Chest Port 1 View  Result Date: 03/27/2020 CLINICAL DATA:  Post CABG EXAM: PORTABLE CHEST 1 VIEW COMPARISON:  03/26/2020 FINDINGS: Endotracheal tube is 4 cm above the carina. Bilateral chest tubes in place. No pneumothorax. NG tube tip is in the proximal stomach just beyond the GE junction. Swan-Ganz catheter in the pulmonary outflow tract. Mild vascular congestion and bibasilar atelectasis. IMPRESSION: Postoperative changes.  Bilateral chest tubes without pneumothorax. Support devices as above. Vascular congestion and bibasilar atelectasis. Electronically Signed   By: Rolm Baptise M.D.   On: 03/27/2020 16:11   DG Chest Port 1 View  Result Date: 03/26/2020 CLINICAL DATA:  Non ST elevation MI EXAM: PORTABLE CHEST 1 VIEW COMPARISON:  03/21/2020 FINDINGS: Improved interstitial opacity and pleural effusions. Stable borderline heart size accentuated by portable technique. No pneumothorax IMPRESSION: Improved aeration with diminished pleural effusions. Electronically Signed   By: Monte Fantasia M.D.   On: 03/26/2020 08:44   DG Abd Portable 1V  Result Date: 04/01/2020 CLINICAL DATA:  Feeding tube placement EXAM: PORTABLE ABDOMEN - 1 VIEW COMPARISON:  03/30/2020 FINDINGS: Feeding tube tip at the duodenal jejunal junction. Postoperative changes in the upper abdomen as before. Epicardial pacer wires are visualized. Chest support tubes no  longer seen. Bowel gas pattern nonobstructive. Spinal degenerative changes. IMPRESSION: Feeding tube tip at the duodenal jejunal junction. Electronically Signed   By: Zetta Bills M.D.   On: 04/01/2020 11:30   DG Abd Portable 1V  Result Date: 03/30/2020 CLINICAL DATA:  Feeding tube placement EXAM: PORTABLE ABDOMEN - 1 VIEW COMPARISON:  Portable exam 1220 hours without priors for comparison FINDINGS: Tip of feeding tube projects over proximal to mid stomach. Epicardial pacing wires present. Mediastinal drain and LEFT thoracostomy tube present. Nonobstructive bowel gas pattern. Degenerative disc disease changes thoracolumbar spine. Small amount of contrast in stomach and small bowel. IMPRESSION: Tip of feeding tube projects over proximal to mid stomach. Electronically Signed   By: Lavonia Dana M.D.   On: 03/30/2020 12:31   DG Swallowing Func-Speech Pathology  Result Date: 03/29/2020 Objective Swallowing Evaluation: Type of Study: MBS-Modified Barium Swallow Study  Patient Details Name: Connor [name] MRN: [id number] Date of Birth: [birth year] Today's Date: 03/29/2020 Time: SLP Start Time (ACUTE ONLY): 1010 -SLP Stop Time (ACUTE ONLY): 1040 SLP Time Calculation (min) (ACUTE ONLY): 30 min Past Medical History: Past Medical History: Diagnosis Date . Arthritis   OA AND PAIN RT KNEE . Cancer ([place])   h/o neck - ABOUT 6 YRS AGO - TX'D WITH RADIATION AND CHEMO  . Chronic kidney disease  . GERD without esophagitis 03/21/2020 . Head and neck cancer ~ 2009  S/P radiation & [name] . Headache(784.0)  . Hyperlipidemia  . Hypertension  . Kidney carcinoma ([place])   h/o - NEPHRECTOMY  . 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 Prostate cancer ([place]) 05/20/14  Gleason 4+3=7, volume 25 gm . Radiation 2015  hx of, prostate cancer . Seizures ([place])   hx of x yrs, "the bad kind;bite tongue; STATES LAST [place] 2013; WAS SEEING [name] - HE RETIRED AND PT LAST SAW DR. [name]  . Stroke ([place]) [place] 2013  UNABLE TO SPEAK OR MOVE AND RT SIDE WEAKNESS AND LOSS OF SKIN SENSITIVITY TO HEAT AND COLD ON RT SIDE, DOUBLE VISION. BALANCE PROBLEMS---STATES STILL HAS DOUBLE VISION AND BALANCE PROBLEM AND RT SIDED LOSS OF  SKIN SENSITIVITY Past Surgical History: Past Surgical History: Procedure Laterality Date . hydrocelectomy  11/2000  left . IR FLUORO GUIDE CV LINE RIGHT  08/31/2017 . IR US GUIDE VASC ACCESS RIGHT  08/31/2017 . JOINT REPLACEMENT    LEFT TOTAL KNEE ARTHROPLASTY . MOUTH SURGERY  2019 . NEPHRECTOMY  1990's  left . PROSTATE BIOPSY  05/20/14  Gleason 4+3=7, vol 25 gm . RIGHT/LEFT HEART CATH AND CORONARY ANGIOGRAPHY N/A 03/24/2020  Procedure: RIGHT/LEFT HEART CATH AND CORONARY ANGIOGRAPHY;  Surgeon: Burnell Blanks, MD;  Location: [place] [facility];  Service: Cardiovascular;  Laterality: N/A; . TEE WITHOUT CARDIOVERSION  10/04/2011  Procedure: TRANSESOPHAGEAL ECHOCARDIOGRAM (TEE);  Surgeon: Candee Furbish, MD;  Location: [facility] ENDOSCOPY;  Service: Cardiovascular;  Laterality: N/A; . TOTAL KNEE ARTHROPLASTY  2011  left . 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  He has a hx of dysphagia with MBS on 10/01/2011.  Most recent recommendations for Dysphagia 3 solids and thin liquids with use of strategies. Pt has a hx  of CVA and head/neck cancer.   Subjective: Pt was alert and pleasant Assessment / Plan / Recommendation CHL IP CLINICAL IMPRESSIONS 03/29/2020 Clinical Impression Pt was seen for a modified barium swallow study and he presents with moderately-severe oropharyngeal dysphagia with resultant aspiration of thin liquid and nectar-thick liquid, and deep laryngeal penetration of honey-thick liquid and puree.  Pt demonstrated fairly good airway protection before and during the swallow; however, he had mild-severe pharyngeal residue which resulted in aspiration after the swallow.  Aspiration was sensed and pt was able to intermittently clear trace amounts of aspirated materials with a spontaneous cough.  Of note, pt only consumed tsp of each trial on this examination except for thin liquid via straw (with chin tuck), which resulted in moderate aspiration.  Suspect that laryngeal residue and aspiration would have increased with cup or straw sips of nectar-thick liquid and honey-thick liquid.  Pharyngeal residue was noted to increase as trials increased in viscosity, with severe residue observed with the puree trial.  Oral phase was remarkable for reduced lingual control resulting in premature spillage to the pyriform sinus and reduced lingual strength resulting in oral residue.  Pharyngeal phase was remarkable for reduced BOT retraction resulting in vallecular residue, reduced hyolaryngeal elevation/excursion resulting in vallecular and pyriform residue, and reduced pharyngeal constriction resulting in posterior pharyngeal wall residue.  Suspect reduced UES duration of opening secondary to pharyngeal weakness, but it may also be attributable to esophageal dysfunction.  Pt is at risk for aspiration with all consistencies secondary to pharyngeal weakness and residue.  Recommend continuation of NPO at this time with short-term alternative means of nutrition and frequent oral care.  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  Consideration of long-term alternative means of nutrition may  be warranted in the future if dysphagia does not improve.  SLP will f/u per POC.   SLP Visit Diagnosis Dysphagia, oropharyngeal phase (R13.12) Attention and concentration deficit following -- Frontal lobe and executive function deficit following -- Impact on safety and function Severe aspiration risk   [facility] IP TREATMENT RECOMMENDATION 03/29/2020 Treatment Recommendations Therapy as outlined in treatment plan below   Prognosis 03/29/2020 Prognosis for Safe Diet Advancement Guarded Barriers to Reach Goals Severity of deficits Barriers/Prognosis Comment -- [facility] IP DIET RECOMMENDATION 03/29/2020 SLP Diet Recommendations NPO;Alternative means - temporary;Ice chips PRN after oral care Liquid Administration via -- Medication Administration Via alternative means Compensations -- Postural Changes --   [facility] IP OTHER RECOMMENDATIONS 03/29/2020 Recommended Consults -- Oral Care Recommendations Oral care QID;Staff/trained caregiver to provide oral care Other Recommendations Remove water pitcher   [facility] IP FOLLOW UP RECOMMENDATIONS 03/29/2020 Follow up Recommendations Skilled Nursing facility   [facility] IP FREQUENCY AND DURATION 03/29/2020 Speech Therapy Frequency (ACUTE ONLY) min 2x/week Treatment Duration 2 weeks      [facility] IP ORAL PHASE 03/29/2020 Oral Phase Impaired Oral - Pudding Teaspoon -- Oral - Pudding Cup -- Oral - Honey Teaspoon Delayed oral transit;Lingual/palatal residue Oral - Honey Cup -- Oral - Nectar Teaspoon Premature spillage;Delayed oral transit;Lingual/palatal residue Oral - Nectar Cup -- Oral - Nectar Straw -- Oral - Thin Teaspoon Premature spillage;Delayed oral transit;Lingual/palatal residue Oral - Thin Cup -- Oral - Thin Straw Premature spillage;Lingual/palatal residue Oral - Puree Premature  spillage;Delayed oral transit;Piecemeal swallowing;Lingual/palatal residue Oral - Mech Soft -- Oral - Regular -- Oral - Multi-Consistency -- Oral - Pill -- Oral Phase - Comment --  [facility] IP PHARYNGEAL PHASE 03/29/2020 Pharyngeal Phase Impaired Pharyngeal- Pudding Teaspoon -- Pharyngeal -- Pharyngeal- Pudding Cup -- Pharyngeal -- Pharyngeal- Honey Teaspoon Delayed swallow initiation-pyriform sinuses;Penetration/Aspiration before swallow;Pharyngeal residue - pyriform;Pharyngeal residue - valleculae;Pharyngeal residue - posterior pharnyx;Reduced pharyngeal peristalsis;Reduced anterior laryngeal mobility;Reduced laryngeal elevation;Reduced tongue base retraction;Reduced airway/laryngeal closure Pharyngeal Material enters airway, CONTACTS cords and not ejected out Pharyngeal- Honey Cup -- Pharyngeal -- Pharyngeal- Nectar Teaspoon Delayed swallow initiation-pyriform sinuses;Penetration/Aspiration before swallow;Penetration/Apiration after swallow;Trace aspiration;Pharyngeal residue - pyriform;Pharyngeal residue - valleculae;Pharyngeal residue - posterior pharnyx;Reduced pharyngeal peristalsis;Reduced anterior laryngeal mobility;Reduced laryngeal elevation;Reduced airway/laryngeal closure;Reduced tongue base retraction Pharyngeal Material enters airway, passes BELOW cords then ejected out Pharyngeal- Nectar Cup -- Pharyngeal -- Pharyngeal- Nectar Straw -- Pharyngeal -- Pharyngeal- Thin Teaspoon Delayed swallow initiation-pyriform sinuses;Reduced pharyngeal peristalsis;Reduced anterior laryngeal mobility;Reduced laryngeal elevation;Reduced airway/laryngeal closure;Reduced tongue base retraction;Penetration/Apiration after swallow;Pharyngeal residue - valleculae;Pharyngeal residue - pyriform;Pharyngeal residue - posterior pharnyx;Trace aspiration Pharyngeal Material enters airway, passes BELOW cords and not ejected out despite cough attempt by patient Pharyngeal- Thin Cup -- Pharyngeal -- Pharyngeal- Thin Straw Compensatory  strategies attempted (with notebox);Delayed swallow initiation-pyriform sinuses;Penetration/Aspiration before swallow;Penetration/Apiration after swallow;Moderate aspiration;Pharyngeal residue - valleculae;Pharyngeal residue - pyriform;Pharyngeal residue - posterior pharnyx;Reduced pharyngeal peristalsis;Reduced epiglottic inversion;Reduced anterior laryngeal mobility;Reduced laryngeal elevation;Reduced airway/laryngeal closure;Reduced tongue base retraction Pharyngeal Material enters airway, passes BELOW cords and not ejected out despite cough attempt by patient Pharyngeal- Puree Delayed swallow initiation-pyriform sinuses;Penetration/Aspiration before swallow;Penetration/Apiration after swallow;Pharyngeal residue - valleculae;Pharyngeal residue - pyriform;Pharyngeal residue - posterior pharnyx;Reduced pharyngeal peristalsis;Reduced epiglottic inversion;Reduced anterior laryngeal mobility;Reduced laryngeal elevation;Reduced airway/laryngeal closure;Reduced tongue base retraction Pharyngeal Material enters airway, remains ABOVE vocal cords and not ejected out Pharyngeal- Mechanical Soft -- Pharyngeal -- Pharyngeal- Regular -- Pharyngeal -- Pharyngeal- Multi-consistency -- Pharyngeal -- Pharyngeal- Pill -- Pharyngeal -- Pharyngeal Comment --  CHL IP CERVICAL ESOPHAGEAL PHASE 03/29/2020 Cervical Esophageal Phase Impaired Pudding Teaspoon --  Pudding Cup -- Honey Teaspoon -- Honey Cup -- Nectar Teaspoon -- Nectar Cup -- Nectar Straw -- Thin Teaspoon -- Thin Cup -- Thin Straw -- Puree -- Mechanical Soft -- Regular -- Multi-consistency -- Pill -- Cervical Esophageal Comment suspected residue in the cervical esophagus (radiologist not present to confirm)  Colin Mulders., M.S., [place] Office: [phone] [place] 03/29/2020, 2:59 PM              ECHOCARDIOGRAM COMPLETE  Result Date: 03/22/2020    ECHOCARDIOGRAM REPORT   Patient Name:   Connor [name] Date of Exam: 03/22/2020 Medical Rec  #:  [id number]            Height:       70.0 in Accession #:    [id number]           Weight:       205.0 lb Date of Birth:  Apr 29, 1946            BSA:          2.109 m Patient Age:    33 years             BP:           113/89 mmHg Patient Gender: M                    HR:           85 bpm. Exam Location:  Inpatient Procedure: 2D Echo Indications:    CHF-Acute Systolic 952.84 / X32.44  History:        Patient has prior history of Echocardiogram examinations, most                 recent 10/04/2011. CHF, Stroke; Risk Factors:Hypertension,                 Dyslipidemia and Non-Smoker.  Sonographer:    Leavy Cella Referring Phys: [id number] [place]  1. Akinesis of the anteroseptal, apical and basal inferior walls with overall severe LV dysfunction; grade 2 diastolic dysfunction; mild LVE; trace AI.  2. Left ventricular ejection fraction, by estimation, is 25 to 30%. The left ventricle has severely decreased function. The left ventricle demonstrates regional wall motion abnormalities (see scoring diagram/findings for description). The left ventricular internal cavity size was mildly dilated. Left ventricular diastolic parameters are consistent with Grade II diastolic dysfunction (pseudonormalization). Elevated left atrial pressure.  3. Right ventricular systolic function is normal. The right ventricular size is normal. There is mildly elevated pulmonary artery systolic pressure.  4. The mitral valve is normal in structure. No evidence of mitral valve regurgitation. No evidence of mitral stenosis.  5. The aortic valve is normal in structure. Aortic valve regurgitation is trivial. Mild to moderate aortic valve sclerosis/calcification is present, without any evidence of aortic stenosis.  6. The inferior vena cava is normal in size with greater than 50% respiratory variability, suggesting right atrial pressure of 3 mmHg. FINDINGS  Left Ventricle: Left ventricular ejection fraction, by estimation, is 25 to  30%. The left ventricle has severely decreased function. The left ventricle demonstrates regional wall motion abnormalities. The left ventricular internal cavity size was mildly  dilated. There is no left ventricular hypertrophy. Left ventricular diastolic parameters are consistent with Grade II diastolic dysfunction (pseudonormalization). Elevated left atrial pressure. Right Ventricle: The right ventricular size is normal.Right ventricular systolic function is normal. There is mildly elevated pulmonary artery systolic pressure. The tricuspid regurgitant  velocity is 2.88 m/s, and with an assumed right atrial pressure of  3 mmHg, the estimated right ventricular systolic pressure is 16.1 mmHg. Left Atrium: Left atrial size was normal in size. Right Atrium: Right atrial size was normal in size. Pericardium: A small pericardial effusion is present. Mitral Valve: The mitral valve is normal in structure. Normal mobility of the mitral valve leaflets. Mild mitral annular calcification. No evidence of mitral valve regurgitation. No evidence of mitral valve stenosis. Tricuspid Valve: The tricuspid valve is normal in structure. Tricuspid valve regurgitation is mild . No evidence of tricuspid stenosis. Aortic Valve: The aortic valve is normal in structure. Aortic valve regurgitation is trivial. Aortic regurgitation PHT measures 388 msec. Mild to moderate aortic valve sclerosis/calcification is present, without any evidence of aortic stenosis. Pulmonic Valve: The pulmonic valve was normal in structure. Pulmonic valve regurgitation is mild. No evidence of pulmonic stenosis. Aorta: The aortic root is normal in size and structure. Venous: The inferior vena cava is normal in size with greater than 50% respiratory variability, suggesting right atrial pressure of 3 mmHg. IAS/Shunts: No atrial level shunt detected by color flow Doppler. Additional Comments: Akinesis of the anteroseptal, apical and basal inferior walls with overall  severe LV dysfunction; grade 2 diastolic dysfunction; mild LVE; trace AI.  LEFT VENTRICLE PLAX 2D LVIDd:         5.57 cm  Diastology LVIDs:         4.34 cm  LV e' lateral:   5.77 cm/s LV PW:         1.49 cm  LV E/e' lateral: 13.7 LV IVS:        1.05 cm  LV e' medial:    4.46 cm/s LVOT diam:     1.90 cm  LV E/e' medial:  17.8 LVOT Area:     2.84 cm  RIGHT VENTRICLE RV S prime:     11.30 cm/s TAPSE (M-mode): 2.2 cm LEFT ATRIUM           Index       RIGHT ATRIUM           Index LA diam:      3.80 cm 1.80 cm/m  RA Area:     16.80 cm LA Vol (A2C): 57.8 ml 27.40 ml/m RA Volume:   52.90 ml  25.08 ml/m LA Vol (A4C): 54.2 ml 25.69 ml/m  AORTIC VALVE AI PHT:      388 msec  AORTA Ao Root diam: 3.10 cm MITRAL VALVE               TRICUSPID VALVE MV Area (PHT): 4.15 cm    TR Peak grad:   33.2 mmHg MV Decel Time: 183 msec    TR Vmax:        288.00 cm/s MR Peak grad: 44.1 mmHg MR Mean grad: 29.0 mmHg    SHUNTS MR Vmax:      332.00 cm/s  Systemic Diam: 1.90 cm MR Vmean:     258.0 cm/s MV E velocity: 79.30 cm/s MV A velocity: 64.70 cm/s MV E/A ratio:  1.23 Kirk Ruths MD Electronically signed by Kirk Ruths MD Signature Date/Time: 03/22/2020/1:24:26 PM    Final    ECHO INTRAOPERATIVE TEE  Result Date: 03/28/2020  *INTRAOPERATIVE TRANSESOPHAGEAL REPORT *  Patient Name:   Connor [name] Date of Exam: 03/27/2020 Medical Rec #:  [id number]            Height:       70.0 in Accession #:  [id number]           Weight:       186.4 lb Date of Birth:  12-04-1945            BSA:          2.03 m Patient Age:    89 years             BP:           100/68 mmHg Patient Gender: M                    HR:           72 bpm. Exam Location:  Anesthesiology Transesophogeal exam was perform intraoperatively during surgical procedure. Patient was closely monitored under general anesthesia during the entirety of examination. Indications:     I25.110 Atherosclerotic heart disease of native coronary artery                  with unstable angina  pectoris Sonographer:     Darlina Sicilian RDCS Performing Phys: [place] TRIGT Diagnosing Phys: Suzette Battiest MD Complications: No known complications during this procedure. POST-OP IMPRESSIONS - Left Ventricle: Slightly improved LVF post bypass on inotropes. EF 30-35%. - Right Ventricle: The right ventricle appears unchanged from pre-bypass. - Aorta: The aorta appears unchanged from pre-bypass. - Left Atrium: The left atrium appears unchanged from pre-bypass. - Left Atrial Appendage: The left atrial appendage appears unchanged from pre-bypass. - Aortic Valve: The aortic valve appears unchanged from pre-bypass. - Mitral Valve: The mitral valve appears unchanged from pre-bypass. - Tricuspid Valve: The tricuspid valve appears unchanged from pre-bypass. - Interatrial Septum: The interatrial septum appears unchanged from pre-bypass. - Interventricular Septum: The interventricular septum appears unchanged from pre-bypass. - Pericardium: The pericardium appears unchanged from pre-bypass. PRE-OP FINDING  Left Ventricle: The left ventricle has severely reduced systolic function, with an ejection fraction of 25-30%. The cavity size was mildly dilated. There is no increase in left ventricular wall thickness. Right Ventricle: The right ventricle has normal systolic function. The cavity was normal. There is no increase in right ventricular wall thickness. Left Atrium: Left atrial size was normal in size.  Interatrial Septum: No atrial level shunt detected by color flow Doppler. Pericardium: There is no evidence of pericardial effusion. Mitral Valve: The mitral valve is normal in structure. Mild thickening of the mitral valve leaflet. Mitral valve regurgitation is mild by color flow Doppler. There is No evidence of mitral stenosis. Tricuspid Valve: The tricuspid valve was normal in structure. Tricuspid valve regurgitation is trivial by color flow Doppler. The tricuspid valve is mildly thickened. Aortic Valve: The aortic  valve is tricuspid There is moderate thickening of the aortic valve and There is mild calcification of the aortic valve Aortic valve regurgitation is trivial by color flow Doppler. There is no stenosis of the aortic valve. Pulmonic Valve: The pulmonic valve was normal in structure. Pulmonic valve regurgitation is trivial by color flow Doppler.  Suzette Battiest MD Electronically signed by Suzette Battiest MD Signature Date/Time: 03/28/2020/9:18:55 AM    Final    VAS US DOPPLER PRE CABG  Result Date: 03/26/2020 PREOPERATIVE VASCULAR EVALUATION  Indications:      Pre-CABG. Risk Factors:     Hypertension, hyperlipidemia. Comparison Study: 10/03/2011- carotid artery duplex Performing Technologist: Maudry Mayhew MHA, RVT, RDCS, RDMS  Examination Guidelines: A complete evaluation includes B-mode imaging, spectral Doppler, color Doppler, and power Doppler as needed of all accessible portions of each  vessel. Bilateral testing is considered an integral part of a complete examination. Limited examinations for reoccurring indications may be performed as noted.  Right Carotid Findings: +----------+--------+--------+--------+-----------------------+--------+           PSV cm/sEDV cm/sStenosisDescribe               Comments +----------+--------+--------+--------+-----------------------+--------+ CCA Prox  66      17              smooth and heterogenous         +----------+--------+--------+--------+-----------------------+--------+ CCA Distal56      18                                              +----------+--------+--------+--------+-----------------------+--------+ ICA Prox  69      21              smooth and heterogenous         +----------+--------+--------+--------+-----------------------+--------+ ICA Distal85      25                                              +----------+--------+--------+--------+-----------------------+--------+ ECA       56      7                                                +----------+--------+--------+--------+-----------------------+--------+ Portions of this table do not appear on this page. +----------+--------+-------+----------------+------------+           PSV cm/sEDV cmsDescribe        Arm Pressure +----------+--------+-------+----------------+------------+ [id number]             Multiphasic, WNL             +----------+--------+-------+----------------+------------+ +---------+--------+--+--------+--+---------+ VertebralPSV cm/s46EDV cm/s13Antegrade +---------+--------+--+--------+--+---------+ Left Carotid Findings: +----------+--------+--------+--------+-----------------------+--------+           PSV cm/sEDV cm/sStenosisDescribe               Comments +----------+--------+--------+--------+-----------------------+--------+ CCA Prox  68      18                                              +----------+--------+--------+--------+-----------------------+--------+ CCA Distal74      25              smooth and heterogenous         +----------+--------+--------+--------+-----------------------+--------+ ICA Prox  90      33              calcific                        +----------+--------+--------+--------+-----------------------+--------+ ICA Distal98      32                                              +----------+--------+--------+--------+-----------------------+--------+ ECA       48  15              smooth and heterogenous         +----------+--------+--------+--------+-----------------------+--------+ +----------+--------+--------+--------+------------+ SubclavianPSV cm/sEDV cm/sDescribeArm Pressure +----------+--------+--------+--------+------------+           86                                   +----------+--------+--------+--------+------------+ +---------+--------+--+--------+--+ VertebralPSV cm/s60EDV cm/s21 +---------+--------+--+--------+--+  ABI Findings:  +--------+------------------+-----+---------+--------+ Right   Rt Pressure (mmHg)IndexWaveform Comment  +--------+------------------+-----+---------+--------+ [id number]                    triphasic         +--------+------------------+-----+---------+--------+ PTA                            triphasic         +--------+------------------+-----+---------+--------+ DP                             triphasic         +--------+------------------+-----+---------+--------+ +--------+------------------+-----+---------+-------+ Left    Lt Pressure (mmHg)IndexWaveform Comment +--------+------------------+-----+---------+-------+ [id number]                     triphasic        +--------+------------------+-----+---------+-------+ PTA                            triphasic        +--------+------------------+-----+---------+-------+ DP                             triphasic        +--------+------------------+-----+---------+-------+  Right Doppler Findings: +--------+--------+-----+---------+--------+ Site    PressureIndexDoppler  Comments +--------+--------+-----+---------+--------+ [id number]          triphasic         +--------+--------+-----+---------+--------+ Radial               triphasic         +--------+--------+-----+---------+--------+ Ulnar                triphasic         +--------+--------+-----+---------+--------+  Left Doppler Findings: +--------+--------+-----+---------+--------+ Site    PressureIndexDoppler  Comments +--------+--------+-----+---------+--------+ [id number]           triphasic         +--------+--------+-----+---------+--------+ Radial               triphasic         +--------+--------+-----+---------+--------+ Ulnar                triphasic         +--------+--------+-----+---------+--------+  Summary: Right Carotid: Velocities in the right ICA are consistent with a 1-39% stenosis. Left Carotid:  Velocities in the left ICA are consistent with a 1-39% stenosis.               There is evidence by color Doppler and spectral Doppler of an area               of reversed flow in the left ICA, as well as arterialized flow in               the left IJV, with evidence of possible communication of the two  vessels. 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 Vertebrals:  Bilateral vertebral arteries demonstrate antegrade flow. Subclavians: Normal flow hemodynamics were seen in bilateral subclavian              arteries. Right Upper Extremity: Doppler waveform obliterate with right radial compression. Doppler waveforms remain within normal limits with right ulnar compression. Left Upper Extremity: Doppler waveforms remain within normal limits with left radial compression. Doppler waveforms decrease >50% with left ulnar compression.  Electronically signed by Monica Martinez MD on 03/26/2020 at 4:13:57 PM.    Final    [place] EKG SITE RITE  Result Date: 03/29/2020 If Site Rite image not attached, placement could not be confirmed due to current cardiac rhythm.   Labs:  CBC: Recent Labs    03/31/20 0537 04/01/20 0420 04/02/20 0413 04/03/20 0427  WBC 8.1 7.8 7.4 7.6  HGB 8.2* 8.6* 8.9* 8.7*  HCT 26.2* 27.3* 28.3* 27.8*  PLT 201 237 260 268    COAGS: Recent Labs    02/28/20 2247 03/26/20 0336 03/27/20 0358 03/27/20 1430  INR 1.1 1.1  --  1.5*  APTT  --   --  89* 31    BMP: Recent Labs    03/31/20 0537 04/01/20 0420 04/02/20 0413 04/03/20 0427  NA 136 137 136 136  K 3.4* 3.4* 3.4* 3.7  CL 99 100 98 95*  CO2 27 29 29 30   GLUCOSE 123* 103* 137* 120*  BUN 25* 20 23 24*  CALCIUM 8.9 8.8* 8.6* 8.5*  CREATININE 1.00 1.04 1.04 1.00  GFRNONAA >60 >60 >60 >60  GFRAA >60 >60 >60 >60    LIVER FUNCTION TESTS: Recent Labs    03/21/20 1730 03/21/20 1730 03/22/20 0403 03/23/20 0429 03/28/20 0456 03/30/20 1839  BILITOT 0.4  --  0.6 0.9 1.2  --   AST 21  --  19 17 36  --     ALT 13  --  12 11 14   --   ALKPHOS 84  --  78 75 51  --   PROT 6.8  --  6.1* 6.2* 5.4*  --   ALBUMIN 3.6   < > 3.2* 3.1* 3.2* 2.7*   < > = values in this interval not displayed.    Assessment and Plan:  74 y.o, male inpatient. 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 Post surgical the patient was found to have a poor swallow function. Team is requesting gastrostomy tube placement for ongoing nutrition access. Patient currently has a coretrak in place.  Pertinent Imaging 6.11.21 - CT abd pelvis reads Gastric anatomy amenable to potential percutaneous gastrostomy tube placement as indicated. 6.6.21 - Barium Swallow study - severe aspiration risk  Pertinent IR History none  Pertinent Allergies Morphine   BUN 24 All labs and medications are within acceptable parameters.  Risks and benefits image guided gastrostomy tube placement was discussed with the patient including, but not limited to the need for a barium enema during the procedure, bleeding, infection, peritonitis and/or damage to adjacent structures.  All of the patient's questions were answered, patient is agreeable to proceed.  Consent signed and in chart.    Thank you for this interesting consult.  I greatly enjoyed meeting Connor [name] and look forward to participating in their  care.  A copy of this report was sent to the requesting provider on this date.  Electronically Signed: Avel Peace, NP 04/04/2020, 2:34 PM   I spent a total of 40 Minutes    in face to face in clinical consultation, greater than 50% of which was counseling/coordinating care for gastrostomy tube placement

## 2020-04-05 LAB — GLUCOSE, CAPILLARY: Glucose-Capillary: 119 mg/dL — ABNORMAL HIGH (ref 70–99)

## 2020-04-05 NOTE — Progress Notes (Addendum)
[address]       [place] [address]             [phone]      9 Days Post-Op Procedure(s) (LRB): CORONARY ARTERY BYPASS GRAFTING (CABG) x Three, using left internal mammary artery and right leg greater saphenous vein harvested endoscopically (N/A) TRANSESOPHAGEAL ECHOCARDIOGRAM (TEE) (N/A) Subjective: Looks and feels well  Objective: Vital signs in last 24 hours: Temp:  [97.3 F (36.3 C)-98.2 F (36.8 C)] 98.1 F (36.7 C) (06/13 0753) Pulse Rate:  [71-77] 73 (06/13 0753) Cardiac Rhythm: Normal sinus rhythm (06/13 0753) Resp:  [14-19] 14 (06/13 0753) BP: (102-124)/(65-71) 104/65 (06/13 0753) SpO2:  [94 %-99 %] 99 % (06/13 0753) Weight:  [87.7 kg] 87.7 kg (06/13 0500)  Hemodynamic parameters for last 24 hours:    Intake/Output from previous day: 06/12 0701 - 06/13 0700 In: 1530 [P.O.:480; NG/GT:1050] Out: 1600 [Urine:1600] Intake/Output this shift: Total I/O In: -  Out: 200 [Urine:200]  General appearance: alert, cooperative and no distress Heart: regular rate and rhythm Lungs: minor upper airway coarseness Abdomen: benign Extremities: minor edema Wound: incis healing well  Lab Results: Recent Labs    04/03/20 0427  WBC 7.6  HGB 8.7*  HCT 27.8*  PLT 268   BMET:  Recent Labs    04/03/20 0427  NA 136  K 3.7  CL 95*  CO2 30  GLUCOSE 120*  BUN 24*  CREATININE 1.00  CALCIUM 8.5*    PT/INR: No results for input(s): LABPROT, INR in the last 72 hours. ABG    Component Value Date/Time   PHART 7.398 03/28/2020 0519   HCO3 24.8 03/28/2020 0519   TCO2 26 03/28/2020 0519   ACIDBASEDEF 1.0 03/27/2020 2300   O2SAT 66.3 04/02/2020 0413   CBG (last 3)  Recent Labs    04/03/20 2339 04/04/20 0425 04/05/20 0026  GLUCAP 119* 119* 119*    Meds Scheduled Meds: . aspirin  81 mg Per Tube Daily  . bisacodyl  10 mg Oral Daily   Or  . bisacodyl  10 mg Rectal Daily  . carvedilol  6.25 mg Oral BID WC  . chlorhexidine gluconate  (MEDLINE KIT)  15 mL Mouth Rinse BID  . Chlorhexidine Gluconate Cloth  6 each Topical Daily  . docusate  200 mg Oral Daily  . feeding supplement (PRO-STAT SUGAR FREE 64)  30 mL Per Tube BID  . furosemide  40 mg Per Tube Daily  . lamoTRIgine  100 mg Oral q AM  . lamoTRIgine  200 mg Oral QPM  . mouth rinse  15 mL Mouth Rinse 10 times per day  . pantoprazole sodium  40 mg Per Tube Daily  . polyethylene glycol  17 g Oral Daily  . rosuvastatin  20 mg Oral Daily  . sodium chloride flush  10-40 mL Intracatheter Q12H  . sodium chloride flush  3 mL Intravenous Q12H  . sorbitol  30 mL Per Tube Once  . sorbitol  60 mL Per Tube Once   Continuous Infusions: . sodium chloride Stopped (03/29/20 1634)  . sodium chloride Stopped (03/28/20 1740)  . sodium chloride 20 mL/hr at 03/31/20 0800  . feeding supplement (VITAL 1.5 CAL) 1,000 mL (04/04/20 2034)  . lactated ringers 20 mL/hr at 03/28/20 0542  . levofloxacin (LEVAQUIN) IV 500 mg (04/04/20 1435)   PRN Meds:.sodium chloride, ondansetron (ZOFRAN) IV, oxyCODONE, sodium chloride flush, sodium chloride flush, traMADol  Xrays CT ABDOMEN WO CONTRAST  Result  Date: 04/04/2020 CLINICAL DATA:  Evaluate gastric anatomy prior to potential percutaneous gastrostomy tube placement. EXAM: CT ABDOMEN WITHOUT CONTRAST TECHNIQUE: Multidetector CT imaging of the abdomen was performed following the standard protocol without IV contrast. COMPARISON:  CT abdomen pelvis-04/04/2017 FINDINGS: The lack of intravenous contrast limits the ability to evaluate solid abdominal organs Lower chest: Limited visualization of the lower thorax demonstrates small/trace bilateral effusions, right greater than left, with associated bibasilar consolidative opacities, atelectasis versus infiltrate. Note is made of a small right basilar pleural calcification. Normal heart size. Trace amount of pericardial fluid, likely postoperative in etiology. Extensive calcifications within native coronary  arteries. Calcifications of the mitral valve annulus. Hepatobiliary: Normal hepatic contour. Normal appearance of the gallbladder given underdistention. No ascites. Pancreas: Pancreas is largely fatty replaced. Spleen: Normal appearance of the spleen. Adrenals/Urinary Tract: Post left-sided nephrectomy. Hypertrophy of the right kidney with accentuated fetal lobulation as better demonstrated on contrast-enhanced abdominal CT performed 04/04/2017. No evidence of right-sided nephrolithiasis or urinary obstruction. Normal appearance the bilateral adrenal glands. The urinary bladder was not imaged. Stomach/Bowel: The stomach is fairly well apposed against the ventral wall of the upper abdomen, and the percutaneous window will likely be improved with gastric insufflation. Enteric tube tip terminates within the horizontal segment of the duodenum. Enteric contrast is seen throughout the colon. No evidence of enteric obstruction. No pneumoperitoneum, pneumatosis or portal venous gas. Vascular/Lymphatic: Atherosclerotic plaque within a normal caliber abdominal aorta. Other: Ill-defined stranding about the midline sternotomy, likely postoperative in etiology. Subcutaneous tracks within the ventral aspect of the upper abdomen likely represent the location of previous mediastinal drains (representative image 24, series 3). Musculoskeletal: No acute or aggressive osseous abnormalities. 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 IMPRESSION: 1. Gastric anatomy amenable to potential percutaneous gastrostomy tube placement as indicated. 2. Small/trace bilateral effusions, right greater than left. 3. Sequela of previous left nephrectomy. 4. Aortic Atherosclerosis (ICD10-I70.0). Electronically Signed   By: Sandi Mariscal M.D.   On: 04/04/2020 04:16    Assessment/Plan: S/P Procedure(s)  (LRB): CORONARY ARTERY BYPASS GRAFTING (CABG) x Three, using left internal mammary artery and right leg greater saphenous vein harvested endoscopically (N/A) TRANSESOPHAGEAL ECHOCARDIOGRAM (TEE) (N/A)  1 remains very stable 2 G-Tube tomorrow    LOS: 15 days    Connor Giovanni PA-C Pager [phone] 04/05/2020 Patient seen and examined, agree with above Will hold TF after midnight  Cherell Colvin C. Roxan Hockey, MD [facility] [phone]

## 2020-04-05 NOTE — Plan of Care (Signed)
  Problem: Education: °Goal: Ability to demonstrate management of disease process will improve °Outcome: Progressing °Goal: Ability to verbalize understanding of medication therapies will improve °Outcome: Progressing °Goal: Individualized Educational Video(s) °Outcome: Progressing °  °Problem: Activity: °Goal: Capacity to carry out activities will improve °Outcome: Progressing °  °Problem: Cardiac: °Goal: Ability to achieve and maintain adequate cardiopulmonary perfusion will improve °Outcome: Progressing °  °Problem: Education: °Goal: Knowledge of General Education information will improve °Description: Including pain rating scale, medication(s)/side effects and non-pharmacologic comfort measures °Outcome: Progressing °  °Problem: Health Behavior/Discharge Planning: °Goal: Ability to manage health-related needs will improve °Outcome: Progressing °  °Problem: Clinical Measurements: °Goal: Ability to maintain clinical measurements within normal limits will improve °Outcome: Progressing °Goal: Will remain free from infection °Outcome: Progressing °Goal: Diagnostic test results will improve °Outcome: Progressing °Goal: Respiratory complications will improve °Outcome: Progressing °Goal: Cardiovascular complication will be avoided °Outcome: Progressing °  °Problem: Activity: °Goal: Risk for activity intolerance will decrease °Outcome: Progressing °  °Problem: Nutrition: °Goal: Adequate nutrition will be maintained °Outcome: Progressing °  °Problem: Coping: °Goal: Level of anxiety will decrease °Outcome: Progressing °  °Problem: Elimination: °Goal: Will not experience complications related to bowel motility °Outcome: Progressing °Goal: Will not experience complications related to urinary retention °Outcome: Progressing °  °Problem: Pain Managment: °Goal: General experience of comfort will improve °Outcome: Progressing °  °Problem: Safety: °Goal: Ability to remain free from injury will improve °Outcome: Progressing °   °Problem: Skin Integrity: °Goal: Risk for impaired skin integrity will decrease °Outcome: Progressing °  °Problem: Education: °Goal: Ability to demonstrate management of disease process will improve °Outcome: Progressing °Goal: Ability to verbalize understanding of medication therapies will improve °Outcome: Progressing °Goal: Individualized Educational Video(s) °Outcome: Progressing °  °Problem: Activity: °Goal: Capacity to carry out activities will improve °Outcome: Progressing °  °Problem: Cardiac: °Goal: Ability to achieve and maintain adequate cardiopulmonary perfusion will improve °Outcome: Progressing °  °

## 2020-04-06 ENCOUNTER — Telehealth: Payer: PPO | Admitting: Internal Medicine

## 2020-04-06 LAB — GLUCOSE, CAPILLARY: Glucose-Capillary: 104 mg/dL — ABNORMAL HIGH (ref 70–99)

## 2020-04-06 MED ORDER — DIPHENHYDRAMINE HCL 12.5 MG/5ML PO LIQD
12.5000 mg | Freq: Every evening | ORAL | Status: DC | PRN
  Filled 2020-04-06 (×4): qty 5

## 2020-04-06 MED ORDER — DIPHENHYDRAMINE HCL 12.5 MG/5ML PO ELIX
12.5000 mg | ORAL_SOLUTION | Freq: Every evening | ORAL | Status: DC | PRN
  Administered 2020-04-06 – 2020-04-07 (×2): 12.5 mg via ORAL
  Filled 2020-04-06 (×2): qty 5

## 2020-04-06 MED ORDER — VITAL 1.5 CAL PO LIQD
1000.0000 mL | ORAL | Status: DC
  Administered 2020-04-06: 1000 mL
  Filled 2020-04-06: qty 1000

## 2020-04-06 NOTE — Progress Notes (Signed)
CARDIAC REHAB PHASE I   PRE:  Rate/Rhythm: 74 SR    BP: lying 113/66    SaO2: 93 RA  MODE:  Ambulation: to door   POST:  Rate/Rhythm: 80 SR    BP: sitting 79/51, 114/74 after 2 min    Standing x4, sitting again 76/51   Attempted ambulation however pt c/o blurred vision with walking a few feet in room. Sat at door and blurred vision resolved. BP 79/51. Pt practiced standing and sitting x4 then tried to ambulate but again blurred vision. Attempted standing BP but would not register. Pt had to sit and BP 76/51. Pt ambulated to recliner, unable to ambulate hall this am. Notified RN. Also SAO2 fluctuating 81-93 RA with good pleth after mobility in recliner. He has been off O2 during the day, just wearing at night. Applied 2L in recliner for now. RN aware.   Discussed IS, sternal precautions, exercise, daily wts, and CRPII. Did not discuss diet due to PO restrictions. Will refer to [facility].  [phone]     [place], ACSM 04/06/2020 9:11 AM

## 2020-04-06 NOTE — Progress Notes (Signed)
Unable to accomodate patient in IR today.  Spoke with RN to update.  May resume TF until MN, then NPO for possible procedure tomorrow.   Brynda Greathouse, MS RD PA-C

## 2020-04-06 NOTE — Progress Notes (Addendum)
      [address]       [place] [address]             [phone]       Called to the bedside this evening regarding patient frustration. He was not able to go down for his PEG today due to IR being backed up. He is very upset that his TF and fluids have been paused with minimal communication from IR. I have encouraged him to stay the night and I will personally call in the morning and work on getting him down to IR for PEG tube placement. He is asking to go home after tube is placed which I think would be reasonable if he has everything set up for nutrition at home.  Restart TF at rate of 45 (didn't tolerate 60) Restart fluids Stop both at midnight for hopeful PEG in the AM Benadryl ordered for sleep per patient request   Nicholes Rough, PA-C

## 2020-04-06 NOTE — Progress Notes (Signed)
10 Days Post-Op Procedure(s) (LRB): CORONARY ARTERY BYPASS GRAFTING (CABG) x Three, using left internal mammary artery and right leg greater saphenous vein harvested endoscopically (N/A) TRANSESOPHAGEAL ECHOCARDIOGRAM (TEE) (N/A) Patient remains in sinus rhythm on room air Feeding tube in place IR CT-guided gastrostomy feeding tube planned for later today Otherwise ambulating and surgical incisions clean and dry  Objective: Vital signs in last 24 hours: Temp:  [97.3 F (36.3 C)-98.5 F (36.9 C)] 97.5 F (36.4 C) (06/14 0412) Pulse Rate:  [70-73] 71 (06/14 0839) Cardiac Rhythm: Normal sinus rhythm (06/14 0735) Resp:  [12-18] 12 (06/14 0839) BP: (105-117)/(61-72) 113/66 (06/14 0839) SpO2:  [92 %-95 %] 92 % (06/14 0839) Weight:  [87.5 kg] 87.5 kg (06/14 0412)  Hemodynamic parameters for last 24 hours:    Intake/Output from previous day: 06/13 0701 - 06/14 0700 In: -  Out: 1425 [Urine:1425] Intake/Output this shift: No intake/output data recorded.      Exam    General- alert and comfortable    Neck- no JVD, no cervical adenopathy palpable, no carotid bruit   Lungs- clear without rales, wheezes   Cor- regular rate and rhythm, no murmur , gallop   Abdomen- soft, non-tender   Extremities - warm, non-tender, minimal edema   Neuro- oriented, appropriate, no focal weakness   Lab Results: No results for input(s): WBC, HGB, HCT, PLT in the last 72 hours. BMET: No results for input(s): NA, K, CL, CO2, GLUCOSE, BUN, CREATININE, CALCIUM in the last 72 hours.  PT/INR: No results for input(s): LABPROT, INR in the last 72 hours. ABG    Component Value Date/Time   PHART 7.398 03/28/2020 0519   HCO3 24.8 03/28/2020 0519   TCO2 26 03/28/2020 0519   ACIDBASEDEF 1.0 03/27/2020 2300   O2SAT 66.3 04/02/2020 0413   CBG (last 3)  Recent Labs    04/04/20 0425 04/05/20 0026 04/06/20 0654  GLUCAP 119* 119* 104*    Assessment/Plan: S/P Procedure(s) (LRB): CORONARY ARTERY BYPASS  GRAFTING (CABG) x Three, using left internal mammary artery and right leg greater saphenous vein harvested endoscopically (N/A) TRANSESOPHAGEAL ECHOCARDIOGRAM (TEE) (N/A) After feeding gastrostomy in place and functional we will DC core track and advanced diet per protocol   LOS: 16 days    Tharon Aquas Trigt III 04/06/2020

## 2020-04-07 ENCOUNTER — Inpatient Hospital Stay (HOSPITAL_COMMUNITY): Payer: PPO

## 2020-04-07 HISTORY — PX: IR GASTROSTOMY TUBE MOD SED: IMG625

## 2020-04-07 LAB — BASIC METABOLIC PANEL
Anion gap: 10 (ref 5–15)
BUN: 21 mg/dL (ref 8–23)
CO2: 28 mmol/L (ref 22–32)
Calcium: 8.7 mg/dL — ABNORMAL LOW (ref 8.9–10.3)
Chloride: 95 mmol/L — ABNORMAL LOW (ref 98–111)
Creatinine, Ser: 1.1 mg/dL (ref 0.61–1.24)
GFR calc Af Amer: >60 mL/min (ref >60)
GFR calc non Af Amer: >60 mL/min (ref >60)
Glucose, Bld: 126 mg/dL — ABNORMAL HIGH (ref 70–99)
Potassium: 3.7 mmol/L (ref 3.5–5.1)
Sodium: 133 mmol/L — ABNORMAL LOW (ref 135–145)

## 2020-04-07 LAB — CBC
HCT: 30.6 % — ABNORMAL LOW (ref 39.0–52.0)
Hemoglobin: 9.4 g/dL — ABNORMAL LOW (ref 13.0–17.0)
MCH: 26.6 pg (ref 26.0–34.0)
MCHC: 30.7 g/dL (ref 30.0–36.0)
MCV: 86.4 fL (ref 80.0–100.0)
Platelets: 322 10*3/uL (ref 150–400)
RBC: 3.54 MIL/uL — ABNORMAL LOW (ref 4.22–5.81)
RDW: 14.3 % (ref 11.5–15.5)
WBC: 12.2 10*3/uL — ABNORMAL HIGH (ref 4.0–10.5)
nRBC: 0 % (ref 0.0–0.2)

## 2020-04-07 MED ORDER — IOHEXOL 300 MG/ML  SOLN
50.0000 mL | Freq: Once | INTRAMUSCULAR | Status: AC | PRN
  Administered 2020-04-07: 10 mL via INTRA_ARTERIAL

## 2020-04-07 MED ORDER — FENTANYL CITRATE (PF) 100 MCG/2ML IJ SOLN
INTRAMUSCULAR | Status: AC
  Filled 2020-04-07: qty 2

## 2020-04-07 MED ORDER — SODIUM CHLORIDE 0.9 % IV SOLN: INTRAVENOUS | Status: DC

## 2020-04-07 MED ORDER — FENTANYL CITRATE (PF) 100 MCG/2ML IJ SOLN
INTRAMUSCULAR | Status: AC | PRN
  Administered 2020-04-07: 50 ug via INTRAVENOUS

## 2020-04-07 MED ORDER — ACETAMINOPHEN 160 MG/5ML PO SOLN
650.0000 mg | Freq: Four times a day (QID) | ORAL | Status: DC | PRN
  Filled 2020-04-07: qty 20.3

## 2020-04-07 MED ORDER — GLUCAGON HCL RDNA (DIAGNOSTIC) 1 MG IJ SOLR
INTRAMUSCULAR | Status: AC
  Filled 2020-04-07: qty 1

## 2020-04-07 MED ORDER — MIDAZOLAM HCL 2 MG/2ML IJ SOLN
INTRAMUSCULAR | Status: AC | PRN
  Administered 2020-04-07: 0.5 mg via INTRAVENOUS
  Administered 2020-04-07: 1 mg via INTRAVENOUS

## 2020-04-07 MED ORDER — CEFAZOLIN SODIUM-DEXTROSE 2-4 GM/100ML-% IV SOLN
INTRAVENOUS | Status: AC
  Administered 2020-04-07: 2 g via INTRAVENOUS
  Filled 2020-04-07: qty 100

## 2020-04-07 MED ORDER — ONDANSETRON HCL 4 MG/2ML IJ SOLN: 4.0000 mg | INTRAMUSCULAR | Status: DC | PRN

## 2020-04-07 MED ORDER — LIDOCAINE HCL 1 % IJ SOLN
INTRAMUSCULAR | Status: AC
  Filled 2020-04-07: qty 20

## 2020-04-07 MED ORDER — PRO-STAT SUGAR FREE PO LIQD
30.0000 mL | Freq: Three times a day (TID) | ORAL | Status: DC
  Administered 2020-04-07 – 2020-04-08 (×3): 30 mL
  Filled 2020-04-07 (×3): qty 30

## 2020-04-07 MED ORDER — GLUCAGON HCL RDNA (DIAGNOSTIC) 1 MG IJ SOLR
INTRAMUSCULAR | Status: AC | PRN
  Administered 2020-04-07: .5 mg via INTRAVENOUS

## 2020-04-07 MED ORDER — MIDAZOLAM HCL 2 MG/2ML IJ SOLN
INTRAMUSCULAR | Status: AC
  Filled 2020-04-07: qty 2

## 2020-04-07 MED ORDER — OSMOLITE 1.5 CAL PO LIQD
237.0000 mL | Freq: Every day | ORAL | Status: DC
  Administered 2020-04-07: 100 mL
  Administered 2020-04-07: 150 mL
  Administered 2020-04-07 – 2020-04-08 (×3): 237 mL
  Filled 2020-04-07 (×8): qty 237

## 2020-04-07 MED ORDER — CEFAZOLIN SODIUM-DEXTROSE 2-4 GM/100ML-% IV SOLN: 2.0000 g | Freq: Once | INTRAVENOUS | Status: AC

## 2020-04-07 NOTE — Progress Notes (Signed)
  Speech Language Pathology Treatment: Dysphagia  Patient Details Name: Connor [name] MRN: [id number] DOB: 1946/04/23 Today's Date: 04/07/2020 Time: 1245-1310 SLP Time Calculation (min) (ACUTE ONLY): 25 min  Assessment / Plan / Recommendation Clinical Impression  Connor [name] was seen for education and recommendations regarding swallow function. Pt has had two MBSS over the past ten days, both of which recommended NPO d/t ongoing severe dysphagia. Wife was present in room upon SLP arrival and stated, "I appreciate your recommendations, but he is going to eat the second he gets home." SLP reviewed results of swallow studies, discussed aspiration risks, research on the Three Pillars of Aspiration PNA, need for diligent oral care in mitigating pneumonia risk, dysphagia exercises and prognosis with his history. He and wife both demonstrate full understanding and appreciate education. They choose to proceed with PO intake of purees and thin liquids. Pt verbalized strategies of swallow, cough, swallow, and use of effortful swallow. Pt may benefit from repeat MBSS in 3-6 months. No further ST indicated on acute level--pt should follow up with SLP as outpatient.    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  He has a hx of dysphagia with MBS on 10/01/2011.  Most recent recommendations for Dysphagia 3 solids and thin liquids with use of strategies. Pt has a hx of CVA and head/neck cancer.         SLP Plan  Discharge SLP treatment due to (comment)       Recommendations  Diet recommendations: Dysphagia 1 (puree);Thin liquid Medication Administration: Via alternative means         Plan: Discharge SLP treatment due to (comment)                     Connor [name] P. Connor [name], M.S., [place] Pathologist Acute Rehabilitation Services Pager: [place] 04/07/2020, 1:26 PM

## 2020-04-07 NOTE — Procedures (Signed)
  Procedure: Percutaneous gastrostomy tube placement 20f EBL:   minimal Complications:  none immediate  See full dictation in [place] PACS.  D. Sandrine Bloodsworth MD Main # [phone] Pager  [phone]    

## 2020-04-07 NOTE — Progress Notes (Signed)
PT Cancellation Note  Patient Details Name: Connor [name] MRN: [id number] DOB: 1946-09-20   Cancelled Treatment:    Reason Eval/Treat Not Completed: Other (comment). Pt back from IR but with orthostatic hypotension. Continue attempts.    Shary Decamp [facility], [place] 04/07/2020, 1:38 PM [place] Pager [phone] Office [phone]

## 2020-04-07 NOTE — Progress Notes (Signed)
Nutrition Follow-up  DOCUMENTATION CODES:   Non-severe (moderate) malnutrition in context of chronic illness  INTERVENTION:   Initiate trial of bolus tube feeding regimen via G-tube: - For first bolus feed, provide 100 ml of Osmolite 1.5 cal formula. - Increase bolus volume by 50 ml each subsequent feed until goal volume of 237 ml (1 carton/ARC) is reached. - Goal bolus tube feeding regimen is 1 carton/ARC (237 ml) of Osmolite 1.5 cal formula 5 X daily with Pro-stat 30 ml TID.  At goal, bolus tube feeding regimen provides 2078 kcal, 119 grams of protein, and 903 ml of H2O.   If pt is unable to tolerate bolus tube feeds, recommend 16-hour nocturnal tube feeding regimen: - Osmolite 1.5 @ 75 ml/hr to run for 16 hours from 1800 to 1000 nightly (total of 1200 ml) - Pro-stat 30 ml TID  Nocturnal tube feeding regimen would provide 2100 kcal, 120 grams of protein, and 914 ml of H2O.  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 Presents this admission with acute exacerbation CHF.  6/01 - s/pRHC/LHCand coronary angiography 6/04 - s/p CABG x 3 6/07 - s/p gastric Cortrak placement 6/09 - Cortrak advanced to duodenal jejunal junction 6/10 - MBS with recommendations for thin liquids (water only)  Consult received for tube feeding initiation and  management. Pt is s/p G-tube placement this morning by IR. Tube feeds stopped at midnight for procedure. Per RN, okay to use G-tube for tube feeds at this time per IR. RD will place tube feeding orders.  Discussed plan with TCTS PA and RN. Will trial bolus tube feeding regimen and assess for tolerance. If pt is unable to tolerate bolus tube feeding regimen, recommend 16-hour nocturnal tube feeding regimen.  RN reports that pt had difficulty tolerating continuous tube feeds above 50 ml/hr. RN states that pt requested tube feeding rate be decreased due to feelings of fullness and nausea.  Spoke with pt and significant other at bedside. Pt and significant other are under the impression that he will be discharging home today. Explained to pt and significant other that this was not RD's understanding and that it is likely pt will be here until tomorrow. Relayed this information to RN.  Pt reports that when tube feeds were running above 50 ml/hr, he felt that he was "filling up." Pt denies feeling nausea or having any regurgitation. Pt states that he felt like "it was getting backed up."  RD explained plan for trial of bolus tube feeds to pt and significant other. All questions answered. Pt and significant other with questions about whether pt will be able to eat soft foods like he did before. Will defer this to SLP. Encouraged pt and significant other to discuss with SLP.  Current TF order: Vital 1.5 @ 45 ml/hr, Pro-stat 30 ml BID  Admission weight:93 kg Current weight: 86.5 kg  Medications reviewed and include: dulcolax, colace, lasix, protonix,  miralax, IV abx  Labs reviewed: hemoglobin 8.7  Diet Order:   Diet Order            Diet NPO time specified Except for: Sips with Meds  Diet effective midnight                 EDUCATION NEEDS:   Education needs have been addressed  Skin:  Skin Assessment: Reviewed RN Assessment (incision chest and R leg and knee)  Last BM:   04/04/20  Height:   Ht Readings from Last 1 Encounters:  03/22/20 5\' 10"  (1.778 m)    Weight:   Wt Readings from Last 1 Encounters:  04/07/20 86.5 kg    BMI:  Body mass index is 27.36 kg/m.  Estimated Nutritional Needs:   Kcal:  2300-2500 kcal  Protein:  115-130 grams  Fluid:  >/= 2.3 L/day    Gaynell Face, MS, RD, LDN Inpatient Clinical Dietitian Pager: [phone] Weekend/After Hours: [phone]

## 2020-04-07 NOTE — Progress Notes (Signed)
PT Cancellation Note  Patient Details Name: Connor [name] MRN: [id number] DOB: 07/29/1946   Cancelled Treatment:    Reason Eval/Treat Not Completed: Patient at procedure or test/unavailable. Pt in IR. Will continue attempts.   Shary Decamp [facility] 04/07/2020, 9:56 AM [place] Pager [phone] Office [phone]

## 2020-04-07 NOTE — Progress Notes (Addendum)
      [address]       [place] [address]             [phone]       Event note:   Episode of hypotension with symptoms including headache, blur vission, an dizziness. + orthostatic studies. Will plan to do the following:   Discontinue Lasix and Coreg for now Start normal saline @75ml /hr Add liquid Tylenol for headache Order bmp and cbc   Nicholes Rough, PA-C

## 2020-04-07 NOTE — Plan of Care (Signed)

## 2020-04-07 NOTE — Progress Notes (Addendum)
      [address]       [place] [address]             [phone]      11 Days Post-Op Procedure(s) (LRB): CORONARY ARTERY BYPASS GRAFTING (CABG) x Three, using left internal mammary artery and right leg greater saphenous vein harvested endoscopically (N/A) TRANSESOPHAGEAL ECHOCARDIOGRAM (TEE) (N/A) Subjective: Feels okay this morning. He is frustrated about still being in the hospital.   Objective: Vital signs in last 24 hours: Temp:  [97.5 F (36.4 C)-97.7 F (36.5 C)] 97.5 F (36.4 C) (06/15 0400) Pulse Rate:  [71-73] 71 (06/14 2314) Cardiac Rhythm: Normal sinus rhythm (06/15 0733) Resp:  [12-16] 12 (06/14 2314) BP: (109-121)/(64-74) 121/74 (06/15 0400) SpO2:  [92 %-97 %] 92 % (06/14 2314) Weight:  [86.5 kg] 86.5 kg (06/15 0412)     Intake/Output from previous day: 06/14 0701 - 06/15 0700 In: 66 [NG/GT:66] Out: 650 [Urine:650] Intake/Output this shift: No intake/output data recorded.  General appearance: alert, cooperative and no distress Heart: regular rate and rhythm, S1, S2 normal, no murmur, click, rub or gallop Lungs: clear to auscultation bilaterally Abdomen: soft, non-tender; bowel sounds normal; no masses,  no organomegaly Extremities: extremities normal, atraumatic, no cyanosis or edema Wound: clean and dry  Lab Results: No results for input(s): WBC, HGB, HCT, PLT in the last 72 hours. BMET: No results for input(s): NA, K, CL, CO2, GLUCOSE, BUN, CREATININE, CALCIUM in the last 72 hours.  PT/INR: No results for input(s): LABPROT, INR in the last 72 hours. ABG    Component Value Date/Time   PHART 7.398 03/28/2020 0519   HCO3 24.8 03/28/2020 0519   TCO2 26 03/28/2020 0519   ACIDBASEDEF 1.0 03/27/2020 2300   O2SAT 66.3 04/02/2020 0413   CBG (last 3)  Recent Labs    04/05/20 0026 04/06/20 0654  GLUCAP 119* 104*    Assessment/Plan: S/P Procedure(s) (LRB): CORONARY ARTERY BYPASS GRAFTING (CABG) x Three, using left internal  mammary artery and right leg greater saphenous vein harvested endoscopically (N/A) TRANSESOPHAGEAL ECHOCARDIOGRAM (TEE) (N/A)  1. CV-NSR in the 70s, BP well controlled 2. Pulm-no recent CXR, on room air with good oxygen saturation 3. Tube feedings restarted yesterday at 19ml/hr since he was not tolerating 32ml.hr. Stopped at midnight so that PEG tube could be inserted today by IR. Called this morning and he is on the list for today. 4. Blood glucose well controlled.  Plan: IR for PEG this morning. He will probably have to stay in the hospital until tomorrow.    LOS: 17 days    Elgie Collard 04/07/2020   Patient currently in IR for percutaneous gastrostomy feeding tube. We will start tube feedings when cleared by IR.  We will ask for home health arrangements for feeding tube care and teaching on bolus feeds and supplying liquid formula.  patient examined and medical record reviewed,agree with above note. Tharon Aquas Trigt III 04/07/2020

## 2020-04-08 ENCOUNTER — Other Ambulatory Visit: Payer: Self-pay | Admitting: Physician Assistant

## 2020-04-08 MED ORDER — DIPHENHYDRAMINE HCL 12.5 MG/5ML PO ELIX
25.0000 mg | ORAL_SOLUTION | Freq: Every evening | ORAL | Status: DC | PRN
  Administered 2020-04-08: 25 mg via ORAL
  Filled 2020-04-08: qty 10

## 2020-04-08 MED ORDER — PRO-STAT SUGAR FREE PO LIQD: 30.0000 mL | Freq: Three times a day (TID) | ORAL | 0 refills | Status: DC

## 2020-04-08 MED ORDER — ASPIRIN 81 MG PO CHEW: 81.0000 mg | CHEWABLE_TABLET | Freq: Every day | ORAL | 0 refills | Status: AC

## 2020-04-08 MED ORDER — PANTOPRAZOLE SODIUM 40 MG PO TBEC: 40.0000 mg | DELAYED_RELEASE_TABLET | Freq: Every day | ORAL | 1 refills | Status: DC

## 2020-04-08 MED ORDER — OSMOLITE 1.5 CAL PO LIQD
355.0000 mL | Freq: Four times a day (QID) | ORAL | Status: DC
  Administered 2020-04-08 – 2020-04-09 (×4): 355 mL
  Filled 2020-04-08 (×6): qty 474

## 2020-04-08 MED ORDER — ACETAMINOPHEN 160 MG/5ML PO SOLN: 650.0000 mg | Freq: Four times a day (QID) | ORAL | 0 refills | Status: DC | PRN

## 2020-04-08 MED ORDER — OSMOLITE 1.5 CAL PO LIQD: 237.0000 mL | Freq: Every day | ORAL | 0 refills | Status: DC

## 2020-04-08 MED ORDER — ROSUVASTATIN CALCIUM 20 MG PO TABS: 20.0000 mg | ORAL_TABLET | Freq: Every day | ORAL | 1 refills | Status: DC

## 2020-04-08 MED ORDER — MIDODRINE HCL 5 MG PO TABS
5.0000 mg | ORAL_TABLET | Freq: Two times a day (BID) | ORAL | Status: DC
  Administered 2020-04-08 – 2020-04-09 (×2): 5 mg via ORAL
  Filled 2020-04-08 (×2): qty 1

## 2020-04-08 MED ORDER — OXYCODONE HCL 5 MG/5ML PO SOLN: 5.0000 mg | Freq: Four times a day (QID) | ORAL | 0 refills | Status: AC | PRN

## 2020-04-08 MED ORDER — LAMOTRIGINE 100 MG PO TABS: ORAL_TABLET | ORAL | 0 refills | Status: DC

## 2020-04-08 MED ORDER — PANTOPRAZOLE SODIUM 40 MG PO PACK: 40.0000 mg | PACK | Freq: Every day | ORAL | 0 refills | Status: DC

## 2020-04-08 NOTE — Progress Notes (Addendum)
      [address]       [place] [address]             [phone]       Another episode of orthostatic hypotension this morning while working with PT. Eventually after several minutes of standing his BP returned to baseline. Recommendations:   Autonomic dystrophy Compression hose 20-51mmHG  Low-dose Midodrine 5mg  BID  Continue to monitor closely.    Keep patient in the hospital one more day. Spoke with Dr. Prescott Gum   Nicholes Rough, PA-C

## 2020-04-08 NOTE — TOC Transition Note (Addendum)
Transition of Care [facility]) - CM/SW Discharge Note   Patient Details  Name: Connor [name] MRN: [id number] Date of Birth: Mar 09, 1946  Transition of Care [facility]) CM/SW Contact:  Carles Collet, RN Phone Number: 04/08/2020, 9:14 AM   Clinical Narrative:    [place] services arranged through [place]. Notified liaison that patient will DC today. Added PT and SLP to [facility] RN order. (addendum) [employer] for tube feeds, Pam notified.  No DME recs. (addendum) Notified patient requesting rollator. Referral placed to [employer] and one will be delivered to room prior to DC.  Spoke w family and confirmed DC plan. Family to provide transport home.     Final next level of care: [place] Barriers to Discharge: No Barriers Identified   Patient Goals and CMS Choice Patient states their goals for this hospitalization and ongoing recovery are:: get better CMS Medicare.gov Compare Post Acute Care list provided to:: Patient Choice offered to / list presented to : Patient  Discharge Placement                       Discharge Plan and Services                  DME Agency: NA       HH Arranged: RN, PT, Speech Therapy HH Agency: [place] Date [facility] Agency Contacted: 04/08/20 Time [place]: 0076 Representative spoke with at [place]: [place] ([place]) Interventions     Readmission Risk Interventions No flowsheet data found.

## 2020-04-08 NOTE — Progress Notes (Addendum)
Referring Physician(s): Dr. Prescott Gum  Supervising Physician: Sandi Mariscal  Patient Status:  [facility] - In-pt  Chief Complaint: Left tonsil carcinoma s/p radiation, chemotherapy and surgical resection. Gastrostomy tube placed yesterday in IR by Dr. Vernard Gambles.   Subjective: Patient alert, sitting up in chair, just finished working with physical therapy. He ambulated in the hallway. He states he is not having any problems with the g-tube and has been receiving nutrition through it without complication.  Allergies: Morphine and related  Medications: Prior to Admission medications   Medication Sig Start Date End Date Taking? Authorizing Provider  albuterol (VENTOLIN HFA) 108 (90 Base) MCG/ACT inhaler Inhale 2 puffs into the lungs in the morning, at noon, and at bedtime. 03/19/20  Yes [provider]  aspirin 81 MG tablet Take 81 mg by mouth daily.   Yes [provider]  Cyanocobalamin (VITAMIN B-12 PO) Take 1 tablet by mouth daily.   Yes [provider]  ferrous sulfate 325 (65 FE) MG tablet Take 325 mg by mouth 2 (two) times daily with a meal.   Yes [provider]  fluconazole (DIFLUCAN) 200 MG tablet Take 2 tablets (400 mg total) by mouth daily. 10/31/19  Yes Carlyle Basques, MD  fludrocortisone (FLORINEF) 0.1 MG tablet Take 100 mcg by mouth daily. 03/05/20  Yes [provider]  Leuprolide Acetate, 3 Month, (LUPRON DEPOT, 32-MONTH, IM) Inject 1 each into the muscle every 3 (three) months.   Yes [provider]  levofloxacin (LEVAQUIN) 500 MG tablet Take 1 tablet (500 mg total) by mouth daily. Patient taking differently: Take 500 mg by mouth every evening.  03/03/20  Yes Carlyle Basques, MD  simvastatin (ZOCOR) 20 MG tablet Take 20 mg by mouth every evening.   Yes [provider]  acetaminophen (TYLENOL) 160 MG/5ML solution Take 20.3 mLs (650 mg total) by mouth every 6 (six) hours as needed for headache. 04/08/20   Elgie Collard, PA-C    Amino Acids-Protein Hydrolys (FEEDING SUPPLEMENT, PRO-STAT SUGAR FREE 64,) LIQD Place 30 mLs into feeding tube 3 (three) times daily. 04/08/20 05/08/20  Elgie Collard, PA-C  aspirin 81 MG chewable tablet Place 1 tablet (81 mg total) into feeding tube daily. 04/08/20 05/08/20  Elgie Collard, PA-C  lamoTRIgine (LAMICTAL) 100 MG tablet TAKE 1 TABLET PER TUBE IN THE MORNING AND 2 TABLET PER TUBE IN THE EVENING. Must be seen for further refills. Call [phone] to schedule. 04/08/20   Elgie Collard, PA-C  Nutritional Supplements (FEEDING SUPPLEMENT, OSMOLITE 1.5 CAL,) LIQD Place 237 mLs into feeding tube 5 (five) times daily. 04/08/20 05/08/20  Elgie Collard, PA-C  oxyCODONE (ROXICODONE) 5 MG/5ML solution Place 5 mLs (5 mg total) into feeding tube every 6 (six) hours as needed for up to 10 days for severe pain. 04/08/20 04/18/20  Elgie Collard, PA-C  pantoprazole sodium (PROTONIX) 40 mg/20 mL PACK Place 20 mLs (40 mg total) into feeding tube daily. 04/08/20   Elgie Collard, PA-C  rosuvastatin (CRESTOR) 20 MG tablet Place 1 tablet (20 mg total) into feeding tube daily. 04/08/20   Elgie Collard, PA-C     Vital Signs: BP 122/65 (BP Location: Left Arm)   Pulse 90   Temp 97.6 F (36.4 C) (Oral)   Resp (!) 22   Ht 5\' 10"  (1.778 m)   Wt 195 lb 5.2 oz (88.6 kg)   SpO2 90%   BMI 28.03 kg/m   Physical Exam Constitutional:  General: He is not in acute distress.    Appearance: Normal appearance.  Cardiovascular:     Rate and Rhythm: Normal rate and regular rhythm.     Comments: Patient on cardiac monitor. Pulmonary:     Effort: Pulmonary effort is normal.  Abdominal:     Palpations: Abdomen is soft.     Comments: Gastrostomy tube dressing is clean and dry, site is clean and dry.   Skin:    General: Skin is warm and dry.  Neurological:     Mental Status: He is alert and oriented to person, place, and time.     Imaging: IR GASTROSTOMY TUBE MOD SED  Result Date: 04/07/2020 CLINICAL DATA:   Recent CABG, dysphagia, needs enteral feeding support EXAM: PERC PLACEMENT GASTROSTOMY FLUOROSCOPY TIME:  72 seconds; 25 mGy TECHNIQUE: The procedure, risks, benefits, and alternatives were explained to the patient. Questions regarding the procedure were encouraged and answered. The patient understands and consents to the procedure. Patient was receiving adequate prophylactic antibiotic coverage as an inpatient. Progression of previously administered oral barium into the colon was confirmed fluoroscopically. A 5 French angiographic catheter was placed as orogastric tube. The upper abdomen was prepped with Betadine, draped in usual sterile fashion, and infiltrated locally with 1% lidocaine. Intravenous Fentanyl 84mcg and Versed 1.5mg  were administered as conscious sedation during continuous monitoring of the patient's level of consciousness and physiological / cardiorespiratory status by the radiology RN, with a total moderate sedation time of 10 minutes. 0.5 mg glucagon given IV to facilitate gastric distention. Stomach was insufflated using air through the orogastric tube. An 50 French sheath needle was advanced percutaneously into the gastric lumen under fluoroscopy. Gas could be aspirated and a small contrast injection confirmed intraluminal spread. The sheath was exchanged over a guidewire for a 9 [place] vascular sheath, through which the snare device was advanced and used to snare a guidewire passed through the orogastric tube. This was withdrawn, and the snare attached to the 20 French pull-through gastrostomy tube, which was advanced antegrade, positioned with the internal bumper securing the anterior gastric wall to the anterior abdominal wall. Small contrast injection confirms appropriate positioning. The external bumper was applied and the catheter was flushed. COMPLICATIONS: COMPLICATIONS none IMPRESSION: 1. Technically successful 20 French pull-through gastrostomy placement under fluoroscopy.  Electronically Signed   By: Lucrezia Europe M.D.   On: 04/07/2020 11:12    Labs:  CBC: Recent Labs    04/01/20 0420 04/02/20 0413 04/03/20 0427 04/07/20 1300  WBC 7.8 7.4 7.6 12.2*  HGB 8.6* 8.9* 8.7* 9.4*  HCT 27.3* 28.3* 27.8* 30.6*  PLT 237 260 268 322    COAGS: Recent Labs    02/28/20 2247 03/26/20 0336 03/27/20 0358 03/27/20 1430  INR 1.1 1.1  --  1.5*  APTT  --   --  89* 31    BMP: Recent Labs    04/01/20 0420 04/02/20 0413 04/03/20 0427 04/07/20 1300  NA 137 136 136 133*  K 3.4* 3.4* 3.7 3.7  CL 100 98 95* 95*  CO2 29 29 30 28   GLUCOSE 103* 137* 120* 126*  BUN 20 23 24* 21  CALCIUM 8.8* 8.6* 8.5* 8.7*  CREATININE 1.04 1.04 1.00 1.10  GFRNONAA >60 >60 >60 >60  GFRAA >60 >60 >60 >60    LIVER FUNCTION TESTS: Recent Labs    03/21/20 1730 03/21/20 1730 03/22/20 0403 03/23/20 0429 03/28/20 0456 03/30/20 1839  BILITOT 0.4  --  0.6 0.9 1.2  --   AST  21  --  19 17 36  --   ALT 13  --  12 11 14   --   ALKPHOS 84  --  78 75 51  --   PROT 6.8  --  6.1* 6.2* 5.4*  --   ALBUMIN 3.6   < > 3.2* 3.1* 3.2* 2.7*   < > = values in this interval not displayed.    Assessment and Plan:  Left tonsil carcinoma s/p radiation, chemotherapy and surgical resection. Gastrostomy tube placed yesterday in IR by Dr. Vernard Gambles. G-tube is functioning appropriately, patient and nursing staff have no complaints or concerns. IR will sign off. Please call IR with any needs.   Electronically Signed: Theresa Duty, NP 04/08/2020, 2:00 PM   I spent a total of 15 Minutes at the the patient's bedside AND on the patient's hospital floor or unit, greater than 50% of which was counseling/coordinating care for gastrostomy tube.

## 2020-04-08 NOTE — Progress Notes (Addendum)
      [address]       [place] [address]             [phone]      12 Days Post-Op Procedure(s) (LRB): CORONARY ARTERY BYPASS GRAFTING (CABG) x Three, using left internal mammary artery and right leg greater saphenous vein harvested endoscopically (N/A) TRANSESOPHAGEAL ECHOCARDIOGRAM (TEE) (N/A) Subjective: Feels okay this morning. Excited to go home.   Objective: Vital signs in last 24 hours: Temp:  [97.4 F (36.3 C)-98.3 F (36.8 C)] 98.2 F (36.8 C) (06/16 0713) Pulse Rate:  [65-90] 90 (06/16 0713) Cardiac Rhythm: Normal sinus rhythm (06/16 0759) Resp:  [13-19] 17 (06/16 0713) BP: (58-141)/(41-75) 141/65 (06/16 0713) SpO2:  [93 %-100 %] 100 % (06/16 0713) Weight:  [88.6 kg] 88.6 kg (06/16 0452)     Intake/Output from previous day: 06/15 0701 - 06/16 0700 In: 1484.5 [P.O.:180; I.V.:1104.5; IV Piggyback:200] Out: 700 [Urine:700] Intake/Output this shift: No intake/output data recorded.  General appearance: alert, cooperative and no distress Heart: regular rate and rhythm, S1, S2 normal, no murmur, click, rub or gallop Abdomen: slightly distended, nontender Extremities: extremities normal, atraumatic, no cyanosis or edema Wound: clean and dry sternal incision  Lab Results: Recent Labs    04/07/20 1300  WBC 12.2*  HGB 9.4*  HCT 30.6*  PLT 322   BMET:  Recent Labs    04/07/20 1300  NA 133*  K 3.7  CL 95*  CO2 28  GLUCOSE 126*  BUN 21  CREATININE 1.10  CALCIUM 8.7*    PT/INR: No results for input(s): LABPROT, INR in the last 72 hours. ABG    Component Value Date/Time   PHART 7.398 03/28/2020 0519   HCO3 24.8 03/28/2020 0519   TCO2 26 03/28/2020 0519   ACIDBASEDEF 1.0 03/27/2020 2300   O2SAT 66.3 04/02/2020 0413   CBG (last 3)  Recent Labs    04/06/20 0654  GLUCAP 104*    Assessment/Plan: S/P Procedure(s) (LRB): CORONARY ARTERY BYPASS GRAFTING (CABG) x Three, using left internal mammary artery and right leg greater  saphenous vein harvested endoscopically (N/A) TRANSESOPHAGEAL ECHOCARDIOGRAM (TEE) (N/A)  1. CV-NSR in the 70s, BP well controlled-better this morning 2. Pulm-no recent CXR, on room air with good oxygen saturation 3. Tube feedings at goal so will discontinue fluids.  4. Blood glucose well controlled.  Plan: Nutrition has provided recommendations in the chart for TF at home. Patient is willing to accept aspiration risk with oral intake and has spoke to SLP about such risks. Will plan to discharge today if home health is arranged.     LOS: 18 days    Elgie Collard 04/08/2020  tube feedings going well and at goal Surgical incisions clean, dry Ready for DC home- instructions on wound care, activity limits and meds reviewed with patient  patient examined and medical record reviewed,agree with above note. Tharon Aquas Trigt III 04/08/2020

## 2020-04-08 NOTE — Progress Notes (Signed)
Patient was desaturated during taking a nap. Pt need to have oxygen during sleep. Will do noctunal sleep study tonight for insurance prove for home O2.  He c/o insomnia and benadryl was not help for sleep. Notified PA Tessa regarding this matter and increased dose of benadryl. Checked orthostatic BP after administration of midodrine & put on the ted hose knee high and orthostatic was improving than in the morning. HS [employer]

## 2020-04-08 NOTE — Progress Notes (Signed)
Pt placed on RCS pulse ox machine for overnight study. Pt is not on any O2 and his SpO2 is currently 94%. Will return to pick up machine and have the study downloaded and placed in pt's chart.

## 2020-04-08 NOTE — Plan of Care (Signed)

## 2020-04-08 NOTE — Progress Notes (Signed)
Spoke with pt and girlfriend and reviewed education. He voiced understanding of sternal precautions, ambulating x3 daily, watching for fluid/edema, and CRPII. He would benefit from rollator for him given his new orthostasis and general weakness. Notified CM.  [id number] Yves Dill CES, ACSM 11:21 AM 04/08/2020

## 2020-04-08 NOTE — Progress Notes (Signed)
Nutrition Follow-up  DOCUMENTATION CODES:   Non-severe (moderate) malnutrition in context of chronic illness  INTERVENTION:   Bolus tube feeding regimen via G-tube: - 1.5 cartons (355 ml) of Osmolite 1.5 cal formula 4 times daily at 0800, 1200, 1600, and 2000 (total of 6 cartons daily)  Tube feeding regimen provides 2133 kcal, 89 grams of protein, and 1084 ml of H2O (97% of kcal needs, 81% of protein needs).  - 1 Boost High Protein oral nutrition supplement po daily (bringing from home), each supplement provides 240 kcal and 20 grams of protein  Tube feeding regimen and 1 Boost High Protein supplement will provide 2373 kcal and 109 grams of protein daily (100% of kcal needs, 99% of protein needs).  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 Presents this admission with acute exacerbation CHF.  6/01 - s/pRHC/LHCand coronary angiography 6/04 - s/p CABG x 3 6/07 - s/p gastric Cortrak placement 6/09 - Cortrak advanced to duodenal jejunal junction 6/10 - MBS with recommendations for thin liquids (water only) 6/15 - s/p G-tube placement by IR, diet advanced to DYS 1 with thin liquids  Per RN, pt is tolerating goal volume of 237 ml of tube feeding formula per bolus feed. Discussed pt with home health representative. Will adjust  regimen to eliminate need for Pro-stat. Discussed plan with RN.  No meal completions recorded since diet advanced yesterday by SLP.  Spoke with pt and significant other at bedside. Pt reports drinking 1 Boost High Protein supplement yesterday and having some orange juice today. RD discussed change in tube feeding regimen. Encouraged pt to consume 1 Boost High Protein supplement daily to meet protein needs. Pt and significant other express understanding. All questions answered.  Admission weight:93 kg Current weight: 88.6 kg  Pt with mild pitting edema per RN edema assessment.  Medications reviewed and include: dulcolax, colace, lasix, protonix, miralax, IV abx  Labs reviewed: sodium 133, hemoglobin 9.4  UOP: 700 ml x 24 hours  Diet Order:   Diet Order            DIET - DYS 1 Room service appropriate? Yes; Fluid consistency: Thin  Diet effective now                 EDUCATION NEEDS:   Education needs have been addressed  Skin:  Skin Assessment: Reviewed RN Assessment (incision chest and R leg and knee)  Last BM:  04/06/20  Height:   Ht Readings from Last 1 Encounters:  03/22/20 5\' 10"  (1.778 m)    Weight:   Wt Readings from Last 1 Encounters:  04/08/20 88.6 kg    BMI:  Body mass index is 28.03 kg/m.  Estimated Nutritional Needs:   Kcal:  2200-2500  Protein:  110-130 grams  Fluid:  >/= 2.2 L    Gaynell Face, MS, RD, LDN Inpatient Clinical Dietitian Pager: [phone] Weekend/After Hours: [phone]

## 2020-04-08 NOTE — Progress Notes (Signed)
CARDIAC REHAB PHASE I   PRE:  Rate/Rhythm: 86 SR    BP: lying 134/70, sitting 124/64, standing 89/55    SaO2: 97 2 1/2L  MODE:  Ambulation: 470 ft   POST:  Rate/Rhythm: 91 SR    BP: lying 120/64     SaO2: 100 RA  Pt awoken, ready to ambulate. Orthostatic but not symptomatic. To BR then ambulated with rollator in hall. Felt well, no blurred vision. Increased distance. Sat and rested x1, mainly to try sitting on rollator. BP 104/64. To bed after walk. Left off O2 to trial while sleeping. RN sts he typically needs O2 to sleep. Encouraged another walk later.  [place], ACSM 04/08/2020 3:03 PM

## 2020-04-08 NOTE — Progress Notes (Addendum)
Physical Therapy Treatment Patient Details Name: Connor [name] MRN: [id number] DOB: June 13, 1946 Today's Date: 04/08/2020    History of Present Illness Patient is a 74 y/o male who presents with SOB. Admitted with new onset of CHF. Also with bilateral pleural effusions. s/p CABG x3 6/4. 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    PT Comments    Pt tolerated ambulation in hall. Pt with decr BP (see below) on initial standing but rose after several minutes standing and then incr further with amb. Pt didn't have on thigh high TEDS per his wishes. Reiterated that he should wear TEDS if he is having issues with low BP. Pt excited for return home.   Orthostatic BPs  Sitting 94/58  Standing 64/53  Standing after 3 min 89/45  Sitting after amb 105/60     Follow Up Recommendations  Home health PT;Supervision - Intermittent     Equipment Recommendations  Other (comment) (rollator)    Recommendations for Other Services       Precautions / Restrictions Precautions Precautions: Fall;Sternal Precaution Booklet Issued: No Precaution Comments: hx of orthostatic hypotension, reviewed "move in the tube" and sternal precautions    Mobility  Bed Mobility               General bed mobility comments: Up in chair  Transfers Overall transfer level: Needs assistance Equipment used: Rolling walker (2 wheeled) Transfers: Sit to/from Stand Sit to Stand: Min guard         General transfer comment: Incr time to rise. Good adherence to sternal precautions  Ambulation/Gait Ambulation/Gait assistance: Min guard Gait Distance (Feet): 150 Feet Assistive device: Rolling walker (2 wheeled) Gait Pattern/deviations: Step-through pattern;Decreased stride length;Wide base of support Gait velocity: decreased Gait velocity interpretation: 1.31 - 2.62 ft/sec, indicative of limited community ambulator General Gait  Details: Assist for safety due to problems with orthostasis   Stairs             Wheelchair Mobility    Modified Rankin (Stroke Patients Only)       Balance Overall balance assessment: Needs assistance;History of Falls Sitting-balance support: Feet supported;No upper extremity supported Sitting balance-Leahy Scale: Good     Standing balance support: During functional activity;Bilateral upper extremity supported Standing balance-Leahy Scale: Fair                              Cognition Arousal/Alertness: Awake/alert Behavior During Therapy: WFL for tasks assessed/performed Overall Cognitive Status: Within Functional Limits for tasks assessed                                        Exercises      General Comments General comments (skin integrity, edema, etc.): See assessment for BP's      Pertinent Vitals/Pain Pain Assessment: Faces Faces Pain Scale: Hurts a little bit Pain Location: incisions Pain Descriptors / Indicators: Grimacing Pain Intervention(s): Monitored during session    Home Living                      Prior Function            PT Goals (current goals can now be found in the care plan section) Acute Rehab PT Goals Patient Stated Goal: go home Progress towards  PT goals: Progressing toward goals    Frequency    Min 3X/week      PT Plan Current plan remains appropriate    Co-evaluation              AM-PAC PT "6 Clicks" Mobility   Outcome Measure  Help needed turning from your back to your side while in a flat bed without using bedrails?: None Help needed moving from lying on your back to sitting on the side of a flat bed without using bedrails?: A Little Help needed moving to and from a bed to a chair (including a wheelchair)?: A Little Help needed standing up from a chair using your arms (e.g., wheelchair or bedside chair)?: None Help needed to walk in hospital room?: A Little Help needed  climbing 3-5 steps with a railing? : A Little 6 Click Score: 20    End of Session Equipment Utilized During Treatment: Gait belt Activity Tolerance: Patient tolerated treatment well Patient left: in chair;with call bell/phone within reach;with nursing/sitter in room;with family/visitor present Nurse Communication: Mobility status;Other (comment) (BP's) PT Visit Diagnosis: Muscle weakness (generalized) (M62.81);Difficulty in walking, not elsewhere classified (R26.2)     Time: 1683-7290 PT Time Calculation (min) (ACUTE ONLY): 19 min  Charges:  $Gait Training: 8-22 mins                     [place] Pager [phone] Office [place] 04/08/2020, 11:12 AM

## 2020-04-09 DIAGNOSIS — T451X5D Adverse effect of antineoplastic and immunosuppressive drugs, subsequent encounter: Secondary | ICD-10-CM | POA: Diagnosis not present

## 2020-04-09 DIAGNOSIS — C7951 Secondary malignant neoplasm of bone: Secondary | ICD-10-CM | POA: Diagnosis not present

## 2020-04-09 DIAGNOSIS — M8780 Other osteonecrosis, unspecified bone: Secondary | ICD-10-CM | POA: Diagnosis not present

## 2020-04-09 DIAGNOSIS — C61 Malignant neoplasm of prostate: Secondary | ICD-10-CM | POA: Diagnosis not present

## 2020-04-09 DIAGNOSIS — R1312 Dysphagia, oropharyngeal phase: Secondary | ICD-10-CM | POA: Diagnosis not present

## 2020-04-09 DIAGNOSIS — I69351 Hemiplegia and hemiparesis following cerebral infarction affecting right dominant side: Secondary | ICD-10-CM | POA: Diagnosis not present

## 2020-04-09 DIAGNOSIS — M47814 Spondylosis without myelopathy or radiculopathy, thoracic region: Secondary | ICD-10-CM | POA: Diagnosis not present

## 2020-04-09 DIAGNOSIS — J9811 Atelectasis: Secondary | ICD-10-CM | POA: Diagnosis not present

## 2020-04-09 DIAGNOSIS — H532 Diplopia: Secondary | ICD-10-CM | POA: Diagnosis not present

## 2020-04-09 DIAGNOSIS — M5136 Other intervertebral disc degeneration, lumbar region: Secondary | ICD-10-CM | POA: Diagnosis not present

## 2020-04-09 DIAGNOSIS — I951 Orthostatic hypotension: Secondary | ICD-10-CM | POA: Diagnosis not present

## 2020-04-09 DIAGNOSIS — I69354 Hemiplegia and hemiparesis following cerebral infarction affecting left non-dominant side: Secondary | ICD-10-CM | POA: Diagnosis not present

## 2020-04-09 DIAGNOSIS — Z48812 Encounter for surgical aftercare following surgery on the circulatory system: Secondary | ICD-10-CM | POA: Diagnosis not present

## 2020-04-09 DIAGNOSIS — I700 Atherosclerosis of aorta: Secondary | ICD-10-CM | POA: Diagnosis not present

## 2020-04-09 DIAGNOSIS — I69398 Other sequelae of cerebral infarction: Secondary | ICD-10-CM | POA: Diagnosis not present

## 2020-04-09 DIAGNOSIS — I25118 Atherosclerotic heart disease of native coronary artery with other forms of angina pectoris: Secondary | ICD-10-CM | POA: Diagnosis not present

## 2020-04-09 DIAGNOSIS — E782 Mixed hyperlipidemia: Secondary | ICD-10-CM | POA: Diagnosis not present

## 2020-04-09 DIAGNOSIS — I5021 Acute systolic (congestive) heart failure: Secondary | ICD-10-CM | POA: Diagnosis not present

## 2020-04-09 DIAGNOSIS — G40909 Epilepsy, unspecified, not intractable, without status epilepticus: Secondary | ICD-10-CM | POA: Diagnosis not present

## 2020-04-09 DIAGNOSIS — I0981 Rheumatic heart failure: Secondary | ICD-10-CM | POA: Diagnosis not present

## 2020-04-09 DIAGNOSIS — I088 Other rheumatic multiple valve diseases: Secondary | ICD-10-CM | POA: Diagnosis not present

## 2020-04-09 DIAGNOSIS — Z431 Encounter for attention to gastrostomy: Secondary | ICD-10-CM | POA: Diagnosis not present

## 2020-04-09 DIAGNOSIS — I130 Hypertensive heart and chronic kidney disease with heart failure and stage 1 through stage 4 chronic kidney disease, or unspecified chronic kidney disease: Secondary | ICD-10-CM | POA: Diagnosis not present

## 2020-04-09 DIAGNOSIS — I255 Ischemic cardiomyopathy: Secondary | ICD-10-CM | POA: Diagnosis not present

## 2020-04-09 DIAGNOSIS — N189 Chronic kidney disease, unspecified: Secondary | ICD-10-CM | POA: Diagnosis not present

## 2020-04-09 MED ORDER — MIDODRINE HCL 5 MG PO TABS: 5.0000 mg | ORAL_TABLET | Freq: Two times a day (BID) | ORAL | 1 refills | Status: DC

## 2020-04-09 MED ORDER — OSMOLITE 1.5 CAL PO LIQD: 355.0000 mL | Freq: Four times a day (QID) | ORAL | 0 refills | Status: DC

## 2020-04-09 MED ORDER — DIPHENHYDRAMINE HCL 12.5 MG/5ML PO ELIX: 25.0000 mg | ORAL_SOLUTION | Freq: Every evening | ORAL | 0 refills | Status: DC | PRN

## 2020-04-09 MED ORDER — ROSUVASTATIN CALCIUM 20 MG PO TABS: 20.0000 mg | ORAL_TABLET | Freq: Every day | ORAL | 3 refills | Status: DC

## 2020-04-09 NOTE — TOC Transition Note (Signed)
Transition of Care [facility]) - CM/SW Discharge Note   Patient Details  Name: Connor [name] MRN: [id number] Date of Birth: [birth year]  [facility] of [facility] CM/SW Contact:  Sharin Mons, RN Phone Number: [phone] 04/09/2020, 1:22 PM   Clinical Narrative:  Pt will transition to home today with family. [facility] services to follow, [facility] and [facility].  Family to provide transportation.   Final next level of care: [place] Barriers to Discharge: No Barriers Identified   Patient Goals and CMS Choice Patient states their goals for this hospitalization and ongoing recovery are:: get better CMS Medicare.gov Compare Post Acute Care list provided to:: Patient Choice offered to / list presented to : Patient  Discharge Placement                       Discharge Plan and Services                  DME Agency: NA       HH Arranged: RN, PT, Speech Therapy HH Agency: [place] Date [facility] Agency Contacted: 04/08/20 Time [place]: 1700 Representative spoke with at [place]: [place] ([place]) Interventions     Readmission Risk Interventions No flowsheet data found.

## 2020-04-09 NOTE — Progress Notes (Signed)
CARDIAC REHAB PHASE I   PRE:  Rate/Rhythm: 86 SR    BP: lying 105/64, sitting 108/64, standing 78/55    SaO2: 94 RA  MODE:  Ambulation: 450 ft   POST:  Rate/Rhythm: 103 ST    BP: sitting 119/64     SaO2: 94 RA  Pt just waking up. BP low and did have orthostasis standing but no sx. Felt well, he was able to walk long distance without rest. No blurred vision or dizziness. To recliner. BP high after walk. Pt now has rollator and reinforced need to sit if he has any sx.  [phone]   [place], ACSM 04/09/2020 10:25 AM

## 2020-04-09 NOTE — Plan of Care (Signed)
  Problem: Education: Goal: Ability to demonstrate management of disease process will improve Outcome: Completed/Met Goal: Ability to verbalize understanding of medication therapies will improve Outcome: Completed/Met Goal: Individualized Educational Video(s) Outcome: Completed/Met   Problem: Activity: Goal: Capacity to carry out activities will improve Outcome: Completed/Met   Problem: Cardiac: Goal: Ability to achieve and maintain adequate cardiopulmonary perfusion will improve Outcome: Completed/Met   Problem: Education: Goal: Ability to demonstrate management of disease process will improve Outcome: Completed/Met Goal: Ability to verbalize understanding of medication therapies will improve Outcome: Completed/Met Goal: Individualized Educational Video(s) Outcome: Completed/Met   Problem: Activity: Goal: Capacity to carry out activities will improve Outcome: Completed/Met   Problem: Cardiac: Goal: Ability to achieve and maintain adequate cardiopulmonary perfusion will improve Outcome: Completed/Met   Problem: Education: Goal: Knowledge of General Education information will improve Description: Including pain rating scale, medication(s)/side effects and non-pharmacologic comfort measures Outcome: Completed/Met   Problem: Health Behavior/Discharge Planning: Goal: Ability to manage health-related needs will improve Outcome: Completed/Met   Problem: Clinical Measurements: Goal: Ability to maintain clinical measurements within normal limits will improve Outcome: Completed/Met Goal: Will remain free from infection Outcome: Completed/Met Goal: Diagnostic test results will improve Outcome: Completed/Met Goal: Respiratory complications will improve Outcome: Completed/Met Goal: Cardiovascular complication will be avoided Outcome: Completed/Met   Problem: Activity: Goal: Risk for activity intolerance will decrease Outcome: Completed/Met   Problem: Nutrition: Goal:  Adequate nutrition will be maintained Outcome: Completed/Met   Problem: Coping: Goal: Level of anxiety will decrease Outcome: Completed/Met   Problem: Elimination: Goal: Will not experience complications related to bowel motility Outcome: Completed/Met Goal: Will not experience complications related to urinary retention Outcome: Completed/Met   Problem: Pain Managment: Goal: General experience of comfort will improve Outcome: Completed/Met   Problem: Safety: Goal: Ability to remain free from injury will improve Outcome: Completed/Met   Problem: Skin Integrity: Goal: Risk for impaired skin integrity will decrease Outcome: Completed/Met   Problem: Education: Goal: Knowledge of General Education information will improve Description: Including pain rating scale, medication(s)/side effects and non-pharmacologic comfort measures Outcome: Progressing   Problem: Health Behavior/Discharge Planning: Goal: Ability to manage health-related needs will improve Outcome: Progressing   Problem: Clinical Measurements: Goal: Ability to maintain clinical measurements within normal limits will improve Outcome: Progressing Goal: Will remain free from infection Outcome: Progressing Goal: Diagnostic test results will improve Outcome: Progressing Goal: Respiratory complications will improve Outcome: Progressing Goal: Cardiovascular complication will be avoided Outcome: Progressing   Problem: Activity: Goal: Risk for activity intolerance will decrease Outcome: Progressing   Problem: Nutrition: Goal: Adequate nutrition will be maintained Outcome: Progressing   Problem: Coping: Goal: Level of anxiety will decrease Outcome: Progressing   Problem: Elimination: Goal: Will not experience complications related to bowel motility Outcome: Progressing Goal: Will not experience complications related to urinary retention Outcome: Progressing   Problem: Pain Managment: Goal: General  experience of comfort will improve Outcome: Progressing   Problem: Safety: Goal: Ability to remain free from injury will improve Outcome: Progressing   Problem: Skin Integrity: Goal: Risk for impaired skin integrity will decrease Outcome: Progressing

## 2020-04-09 NOTE — Discharge Instructions (Signed)
Discharge Instructions:  1. You may shower, please wash incisions daily with soap and water and keep dry.  If you wish to cover wounds with dressing you may do so but please keep clean and change daily.  No tub baths or swimming until incisions have completely healed.  If your incisions become red or develop any drainage please call our office at [phone]  2. No Driving until cleared by Dr. Van Trigt's office and you are no longer using narcotic pain medications  3. Monitor your weight daily.. Please use the same scale and weigh at same time... If you gain 3-5 lbs in 48 hours with associated lower extremity swelling, please contact our office at [phone]  4. Fever of 101.5 for at least 24 hours with no source, please contact our office at [phone]  5. Activity- up as tolerated, please walk at least 3 times per day.  Avoid strenuous activity, no lifting, pushing, or pulling with your arms over 8-10 lbs for a minimum of 6 weeks  6. If any questions or concerns arise, please do not hesitate to contact our office at [phone] 

## 2020-04-09 NOTE — Progress Notes (Addendum)
      [address] BeachSuite [address]       [place] [address]             [phone]      13 Days Post-Op Procedure(s) (LRB): CORONARY ARTERY BYPASS GRAFTING (CABG) x Three, using left internal mammary artery and right leg greater saphenous vein harvested endoscopically (N/A) TRANSESOPHAGEAL ECHOCARDIOGRAM (TEE) (N/A) Subjective: Feels okay this morning. Sleepy.   Objective: Vital signs in last 24 hours: Temp:  [97.6 F (36.4 C)-98.2 F (36.8 C)] 97.8 F (36.6 C) (06/17 0343) Pulse Rate:  [81-88] 81 (06/16 1952) Cardiac Rhythm: Normal sinus rhythm (06/17 0332) Resp:  [16-22] 16 (06/17 0343) BP: (89-139)/(55-73) 133/69 (06/17 0343) SpO2:  [90 %-97 %] 94 % (06/17 0343) Weight:  [89.3 kg] 89.3 kg (06/17 0610)     Intake/Output from previous day: 06/16 0701 - 06/17 0700 In: 1120.8 [I.V.:338.8; NG/GT:592] Out: 550 [Urine:550] Intake/Output this shift: No intake/output data recorded.  General appearance: alert, cooperative and no distress Heart: regular rate and rhythm, S1, S2 normal, no murmur, click, rub or gallop Lungs: clear to auscultation bilaterally Abdomen: soft, non-tender; bowel sounds normal; no masses,  no organomegaly Extremities: extremities normal, atraumatic, no cyanosis or edema and TED hose in place Wound: clean and dry  Lab Results: Recent Labs    04/07/20 1300  WBC 12.2*  HGB 9.4*  HCT 30.6*  PLT 322   BMET:  Recent Labs    04/07/20 1300  NA 133*  K 3.7  CL 95*  CO2 28  GLUCOSE 126*  BUN 21  CREATININE 1.10  CALCIUM 8.7*    PT/INR: No results for input(s): LABPROT, INR in the last 72 hours. ABG    Component Value Date/Time   PHART 7.398 03/28/2020 0519   HCO3 24.8 03/28/2020 0519   TCO2 26 03/28/2020 0519   ACIDBASEDEF 1.0 03/27/2020 2300   O2SAT 66.3 04/02/2020 0413   CBG (last 3)  No results for input(s): GLUCAP in the last 72 hours.  Assessment/Plan: S/P Procedure(s) (LRB): CORONARY ARTERY BYPASS GRAFTING (CABG) x Three,  using left internal mammary artery and right leg greater saphenous vein harvested endoscopically (N/A) TRANSESOPHAGEAL ECHOCARDIOGRAM (TEE) (N/A)  1. CV-NSR in the 70s, BP 133/69 this morning 2. Pulm-no recent CXR, on room air with good oxygen saturation 3. Tube feedings at goal-nutrition made some adjustments yesterday 4. Blood glucose well controlled. 5. Orthostatic hypotension-midodrine added yesterday, compression hose on the patient this morning.  Plan: BP stable this morning. Will wait until Cardiac rehab walks the patient this morning and ensure he isn't symptomatic or hypotensive. Continue midodrine and compression hose today. Hopefully home later this morning.    LOS: 19 days    Elgie Collard patient examined and medical record reviewed,agree with above note. Tharon Aquas Trigt III 04/09/2020

## 2020-04-10 ENCOUNTER — Telehealth (HOSPITAL_COMMUNITY): Payer: Self-pay

## 2020-04-10 NOTE — Telephone Encounter (Signed)
Faxed referral for Phase II cardiac rehab to [email or web address] °

## 2020-04-13 ENCOUNTER — Other Ambulatory Visit: Payer: Self-pay

## 2020-04-13 ENCOUNTER — Telehealth (INDEPENDENT_AMBULATORY_CARE_PROVIDER_SITE_OTHER): Payer: PPO | Admitting: Internal Medicine

## 2020-04-13 DIAGNOSIS — M272 Inflammatory conditions of jaws: Secondary | ICD-10-CM | POA: Diagnosis not present

## 2020-04-13 NOTE — Progress Notes (Signed)
Patient states recently had bypass surgery two weeks ago at [facility]. States he doing well. States his jaw is feeling good, no issues or concerns.

## 2020-04-13 NOTE — Progress Notes (Signed)
Virtual Visit via Telephone Note  I connected with Connor [name] on 04/13/20 at  9:30 AM EDT by telephone and verified that I am speaking with the correct person using two identifiers.  Location: Patient: at home Provider: in clinic   I discussed the limitations, risks, security and privacy concerns of performing an evaluation and management service by telephone and the availability of in person appointments. I also discussed with the patient that there may be a patient responsible charge related to this service. The patient expressed understanding and agreed to proceed.   History of Present Illness: [name] is a 74 y.o. 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 Bone cx showed mixed flora, didn't comment on PsA or yeast.  No diarrhea  Surgery hx: . PR EXCISION OF BONE, LOWER JAW Left 02/04/2020  Procedure: EXC BONE; MANDIB; Surgeon: Irine Seal, DDS; Location: MAIN OR [facility]; Service: Oral Maxillofacial  . PR OPEN RX MANDIBLE CONDYLE FX,COMPL Left 02/04/2020  Procedure: OPEN TX MANDIB FX-MX APPROACH W/INT FIXA/SPLINTS; Surgeon: Irine Seal, DDS; Location: MAIN OR [facility]; Service: Oral Maxillofacial     Observations/Objective:   Assessment and Plan:  will do abtx for 1 more month, and decide to continue further. Continue on levofloxacin and metronidazole  Follow Up Instructions:    I discussed the assessment and treatment plan with the patient. The patient was provided an opportunity to ask questions and all were answered. The patient agreed with the plan and  demonstrated an understanding of the instructions.   The patient was advised to call back or seek an in-person evaluation if the symptoms worsen or if the condition fails to improve as anticipated.  I provided 15 minutes of non-face-to-face time during this encounter.   Carlyle Basques, MD

## 2020-04-15 ENCOUNTER — Other Ambulatory Visit: Payer: Self-pay

## 2020-04-15 ENCOUNTER — Ambulatory Visit (INDEPENDENT_AMBULATORY_CARE_PROVIDER_SITE_OTHER): Payer: PPO | Admitting: Physician Assistant

## 2020-04-15 ENCOUNTER — Encounter: Payer: Self-pay | Admitting: Physician Assistant

## 2020-04-15 VITALS — BP 140/80 | HR 81 | Temp 97.0°F | Ht 70.0 in | Wt 192.6 lb

## 2020-04-15 DIAGNOSIS — E785 Hyperlipidemia, unspecified: Secondary | ICD-10-CM

## 2020-04-15 DIAGNOSIS — I951 Orthostatic hypotension: Secondary | ICD-10-CM

## 2020-04-15 DIAGNOSIS — I255 Ischemic cardiomyopathy: Secondary | ICD-10-CM

## 2020-04-15 DIAGNOSIS — I10 Essential (primary) hypertension: Secondary | ICD-10-CM | POA: Diagnosis not present

## 2020-04-15 DIAGNOSIS — M272 Inflammatory conditions of jaws: Secondary | ICD-10-CM | POA: Diagnosis not present

## 2020-04-15 DIAGNOSIS — Z8673 Personal history of transient ischemic attack (TIA), and cerebral infarction without residual deficits: Secondary | ICD-10-CM

## 2020-04-15 DIAGNOSIS — I25810 Atherosclerosis of coronary artery bypass graft(s) without angina pectoris: Secondary | ICD-10-CM | POA: Diagnosis not present

## 2020-04-15 DIAGNOSIS — C61 Malignant neoplasm of prostate: Secondary | ICD-10-CM

## 2020-04-15 NOTE — Progress Notes (Signed)
Cardiology Office Note:    Date:  04/17/2020   ID:  Connor [name] [place], DOB 01-Nov-1945, MRN [id number]  PCP:  Josetta Huddle, MD  [facility] Cardiologist:  Pixie Casino, MD  [place] Electrophysiologist:  None   Referring MD: Josetta Huddle, MD   Chief Complaint  Patient presents with  . Follow-up    seen for Dr. Debara Pickett    History of Present Illness:    Connor [name] is a 74 y.o. 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  His oncologist is with [facility] cancer center Dr. Lavera Guise.  He has been receiving Lupron injections for several years.  Patient was admitted in May 2021 with new onset of heart failure.  Echocardiogram showed new LV dysfunction with EF 25 to 30%, grade 2 DD, trace AI.  Subsequent cardiac catheterization revealed triple-vessel disease with 99% ostial RCA, 80% proximal RCA, 80% ostial to proximal LAD, 70% D1, 50% ostial to proximal left circumflex lesion, 90% ostial left main.  Patient was subsequently referred to cardiothoracic surgery.  He was seen by Dr. Lucianne Lei trigt and underwent CABG x3 with LIMA to LAD, SVG to OM and SVG to PDA on 03/27/2020.  Postprocedure, he recovered well.  Patient presents today along with his wife.  Unfortunately he is still on midodrine 5 mg twice daily.  We reviewed the recent cardiac catheterization report.  His sternotomy scar is very well-healed.  He has been ambulating however does notice occasional dizziness in the morning.  I plan to bring the patient back in 3 weeks to try to wean him off of midodrine.  Once his midodrine dose has been weaned  off, we can potentially consider heart failure therapy.  The problem right now is majority of the heart failure therapy tend to drop his blood pressure.  Although there has been a new drug on the market called Verquro which may not have as significant of a blood pressure drop compared to the traditional heart failure medications.  Otherwise, we plan to repeat echocardiogram in 3 months.   Past Medical History:  Diagnosis Date  . Arthritis    OA AND PAIN RT KNEE  . Cancer ([place])    h/o neck - ABOUT 6 YRS AGO - TX'D WITH RADIATION AND CHEMO   . Chronic kidney disease   . GERD without esophagitis 03/21/2020  . Head and neck cancer ~ 2009   S/P radiation & [name]  . Headache(784.0)   . Hyperlipidemia   . Hypertension   . Kidney carcinoma ([place])    h/o - NEPHRECTOMY   . Osteonecrosis of jaw ([place])    Secondary to radiation therapy  . Prostate cancer ([place]) 05/20/14   Gleason 4+3=7, volume 25 gm  . Radiation 2015   hx of, prostate cancer  . Seizures ([place])    hx of x yrs, "the bad kind;bite tongue; STATES LAST [place] 2013; WAS SEEING [name] - HE RETIRED AND PT LAST SAW DR. [name]  . Stroke ([place]) [place] 2013   UNABLE TO SPEAK OR MOVE AND RT SIDE WEAKNESS AND LOSS OF SKIN SENSITIVITY TO HEAT AND COLD ON RT  SIDE, DOUBLE VISION. BALANCE PROBLEMS---STATES STILL HAS DOUBLE VISION AND BALANCE PROBLEM AND RT SIDED LOSS OF SKIN SENSITIVITY    Past Surgical History:  Procedure Laterality Date  . CORONARY ARTERY BYPASS GRAFT N/A 03/27/2020   Procedure: CORONARY ARTERY BYPASS GRAFTING (CABG) x Three, using left internal mammary artery and right leg greater saphenous vein harvested endoscopically;  Surgeon: Ivin Poot, MD;  Location: [place];  Service: Open Heart Surgery;  Laterality: N/A;  . hydrocelectomy  11/2000   left  . IR FLUORO GUIDE CV LINE RIGHT  08/31/2017  . IR GASTROSTOMY TUBE MOD SED  04/07/2020  . IR US GUIDE VASC ACCESS RIGHT  08/31/2017  . JOINT  REPLACEMENT     LEFT TOTAL KNEE ARTHROPLASTY  . MOUTH SURGERY  2019  . NEPHRECTOMY  1990's   left  . PROSTATE BIOPSY  05/20/14   Gleason 4+3=7, vol 25 gm  . RIGHT/LEFT HEART CATH AND CORONARY ANGIOGRAPHY N/A 03/24/2020   Procedure: RIGHT/LEFT HEART CATH AND CORONARY ANGIOGRAPHY;  Surgeon: Burnell Blanks, MD;  Location: [place] CV LAB;  Service: Cardiovascular;  Laterality: N/A;  . TEE WITHOUT CARDIOVERSION  10/04/2011   Procedure: TRANSESOPHAGEAL ECHOCARDIOGRAM (TEE);  Surgeon: Candee Furbish, MD;  Location: [facility] ENDOSCOPY;  Service: Cardiovascular;  Laterality: N/A;  . TEE WITHOUT CARDIOVERSION N/A 03/27/2020   Procedure: TRANSESOPHAGEAL ECHOCARDIOGRAM (TEE);  Surgeon: Prescott Gum, Collier Salina, MD;  Location: [place];  Service: Open Heart Surgery;  Laterality: N/A;  . TOTAL KNEE ARTHROPLASTY  2011   left  . TOTAL KNEE ARTHROPLASTY Right 02/24/2014   Procedure: RIGHT TOTAL KNEE ARTHROPLASTY;  Surgeon: Gearlean Alf, MD;  [facility];  Service: Orthopedics;  Laterality: Right;    Current Medications: Current Meds  Medication Sig  . acetaminophen (TYLENOL) 160 MG/5ML solution Take 20.3 mLs (650 mg total) by mouth every 6 (six) hours as needed for headache.  . albuterol (VENTOLIN HFA) 108 (90 Base) MCG/ACT inhaler Inhale 2 puffs into the lungs in the morning, at noon, and at bedtime.  . Amino Acids-Protein Hydrolys (FEEDING SUPPLEMENT, PRO-STAT SUGAR FREE 64,) LIQD Place 30 mLs into feeding tube 3 (three) times daily.  Marland Kitchen aspirin 81 MG chewable tablet Place 1 tablet (81 mg total) into feeding tube daily.  . diphenhydrAMINE (BENADRYL) 12.5 MG/5ML elixir Take 10 mLs (25 mg total) by mouth at bedtime as needed for sleep.  Marland Kitchen lamoTRIgine (LAMICTAL) 100 MG tablet TAKE 1 TABLET PER TUBE IN THE MORNING AND 2 TABLET PER TUBE IN THE EVENING. Must be seen for further refills. Call [phone] to schedule.  Marland Kitchen Leuprolide Acetate, 3 Month, (LUPRON DEPOT, 73-MONTH, IM) Inject 1 each into the muscle every 3  (three) months.  . metroNIDAZOLE (FLAGYL) 500 MG tablet Take 500 mg by mouth 3 (three) times daily.  . midodrine (PROAMATINE) 5 MG tablet Place 1 tablet (5 mg total) into feeding tube 2 (two) times daily with a meal.  . Nutritional Supplements (FEEDING SUPPLEMENT, OSMOLITE 1.5 CAL,) LIQD Place 355 mLs into feeding tube 4 (four) times daily.  Marland Kitchen oxyCODONE (ROXICODONE) 5 MG/5ML solution Place 5 mLs (5 mg total) into feeding tube every 6 (six) hours as needed for up to 10 days for severe pain.  . pentoxifylline (TRENTAL) 400 MG CR tablet Take 400 mg by mouth 2 (two) times daily with a meal.  . rosuvastatin (CRESTOR) 20 MG tablet Place 1 tablet (20 mg total) into feeding tube daily.     Allergies:   Morphine and related  Social History   Socioeconomic History  . Marital status: Divorced    Spouse name: Not on file  . Number of children: 2  . Years of education: 12th  . Highest education level: Not on file  Occupational History  . Occupation: Information systems manager: OTHER    Comment: [employer]  Tobacco Use  . Smoking status: Never Smoker  . Smokeless tobacco: Never Used  Vaping Use  . Vaping Use: Never used  Substance and Sexual Activity  . Alcohol use: Yes    Alcohol/week: 0.0 standard drinks    Comment: WAS 6 TO 8 BEERS DAILY   . Drug use: No  . Sexual activity: Not Currently  Other Topics Concern  . Not on file  Social History Narrative   Patient is divorced with 2 children.   Patient is right handed.   Patient has hs education.   Patient drinks 2 cups daily.   Social Determinants of Health   Financial Resource Strain:   . Difficulty of Paying Living Expenses:   Food Insecurity:   . Worried About Charity fundraiser in the Last Year:   . Arboriculturist in the Last Year:   Transportation Needs:   . Film/video editor (Medical):   Marland Kitchen Lack of Transportation (Non-Medical):   Physical Activity:   . Days of Exercise per Week:   . Minutes of Exercise per  Session:   Stress:   . Feeling of Stress :   Social Connections:   . Frequency of Communication with Friends and Family:   . Frequency of Social Gatherings with Friends and Family:   . Attends Religious Services:   . Active Member of Clubs or Organizations:   . Attends Archivist Meetings:   Marland Kitchen Marital Status:      Family History: The patient's family history includes Diabetes in his mother; Heart disease in his father and mother.  ROS:   Please see the history of present illness.     All other systems reviewed and are negative.  EKGs/Labs/Other Studies Reviewed:    The following studies were reviewed today:  Echo 03/22/2020 1. Akinesis of the anteroseptal, apical and basal inferior walls with  overall severe LV dysfunction; grade 2 diastolic dysfunction; mild LVE;  trace AI.  2. Left ventricular ejection fraction, by estimation, is 25 to 30%. The  left ventricle has severely decreased function. The left ventricle  demonstrates regional wall motion abnormalities (see scoring  diagram/findings for description). The left  ventricular internal cavity size was mildly dilated. Left ventricular  diastolic parameters are consistent with Grade II diastolic dysfunction  (pseudonormalization). Elevated left atrial pressure.  3. Right ventricular systolic function is normal. The right ventricular  size is normal. There is mildly elevated pulmonary artery systolic  pressure.  4. The mitral valve is normal in structure. No evidence of mitral valve  regurgitation. No evidence of mitral stenosis.  5. The aortic valve is normal in structure. Aortic valve regurgitation is  trivial. Mild to moderate aortic valve sclerosis/calcification is present,  without any evidence of aortic stenosis.  6. The inferior vena cava is normal in size with greater than 50%  respiratory variability, suggesting right atrial pressure of 3 mmHg.    Cath 03/24/2020  Ost RCA to Prox RCA lesion is 99%  stenosed.  Prox RCA lesion is 80% stenosed.  Mid RCA to Dist RCA lesion is 20% stenosed.  Ost LAD to Prox LAD lesion is  80% stenosed.  Ost Cx to Prox Cx lesion is 50% stenosed.  1st Diag lesion is 70% stenosed.  Dist LAD lesion is 30% stenosed.  Ost LM to Ost LAD lesion is 90% stenosed.   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  Elevated filling pressures consistent with continued volume overload.   Recommendations: Will consult CT surgery for CABG. Will resume IV Lasix tonight   EKG:  EKG is ordered today.  The ekg ordered today demonstrates normal sinus rhythm with T wave inversion in the lateral leads.  Recent Labs: 03/21/2020: B Natriuretic Peptide 3,210.4 03/26/2020: TSH 1.157 03/28/2020: ALT 14; Magnesium 2.2 04/07/2020: BUN 21; Creatinine, Ser 1.10; Hemoglobin 9.4; Platelets 322; Potassium 3.7; Sodium 133  Recent Lipid Panel    Component Value Date/Time   CHOL 149 12/05/2011 0730   TRIG 123 12/05/2011 0730   HDL 56 12/05/2011 0730   CHOLHDL 2.7 12/05/2011 0730   VLDL 25 12/05/2011 0730   LDLCALC 68 12/05/2011 0730    Physical Exam:    VS:  BP 140/80   Pulse 81   Temp (!) 97 F (36.1 C)   Ht 5\' 10"  (1.778 m)   Wt 192 lb 9.6 oz (87.4 kg)   SpO2 95%   BMI 27.64 kg/m     Wt Readings from Last 3 Encounters:  04/15/20 192 lb 9.6 oz (87.4 kg)  04/09/20 196 lb 13.9 oz (89.3 kg)  08/26/19 196 lb 6.4 oz (89.1 kg)     GEN:  Well nourished, well developed in no acute distress HEENT: Normal NECK: No JVD; No carotid bruits LYMPHATICS: No lymphadenopathy CARDIAC: RRR, no murmurs, rubs, gallops RESPIRATORY:  Clear to auscultation without rales, wheezing or rhonchi  ABDOMEN: Soft, non-tender, non-distended MUSCULOSKELETAL:  No edema; No deformity  SKIN: Warm and dry NEUROLOGIC:  Alert and oriented x 3 PSYCHIATRIC:  Normal affect   ASSESSMENT:    1. Coronary artery  disease involving coronary bypass graft of native heart without angina pectoris   2. Orthostatic hypotension   3. Essential hypertension   4. Hyperlipidemia LDL goal <70   5. H/O: CVA (cerebrovascular accident)   6. Chronic osteomyelitis of mandible   7. Malignant neoplasm of prostate ([place])   8. Ischemic cardiomyopathy    PLAN:    In order of problems listed above:  1. CAD s/p CABG: Sternotomy scar is well-healed.  He is recovering well after the recent bypass surgery  2. Orthostatic hypotension: This has been going on for the past several months.  He is still on 5 mg twice daily on midodrine, I plan to bring the patient back in 3 weeks to try to wean him off of midodrine  3. Hypertension: He is having more orthostatic hypotension recently  4. Hyperlipidemia: Continue statin therapy.  5. History of CVA: No recent recurrence  6. Chronic osteomyelitis of the mandible: s/p repair  7. Prostate cancer: Followed by oncology service at [facility]  8. Ischemic cardiomyopathy: He is currently not on any heart failure therapy due to orthostatic hypotension.  I plan to wean him off of midodrine on the next visit before considering the addition of heart failure therapy.   Medication Adjustments/Labs and Tests Ordered: Current medicines are reviewed at length with the patient today.  Concerns regarding medicines are outlined above.  Orders Placed This Encounter  Procedures  . EKG 12-Lead   No orders of the defined types were  placed in this encounter.   Patient Instructions  Medication Instructions:  Your physician recommends that you continue on your current medications as directed. Please refer to the Current Medication list given to you today.  *If you need a refill on your cardiac medications before your next appointment, please call your pharmacy*  Lab Work: NONE ordered at this time of appointment   If you have labs (blood work) drawn today and your tests are  completely normal, you will receive your results only by: Marland Kitchen MyChart Message (if you have MyChart) OR . A paper copy in the mail If you have any lab test that is abnormal or we need to change your treatment, we will call you to review the results.  Testing/Procedures: NONE ordered at this time of appointment   Follow-Up: At [facility], you and your health needs are our priority.  As part of our continuing mission to provide you with exceptional heart care, we have created designated Provider Care Teams.  These Care Teams include your primary Cardiologist (physician) and Advanced Practice Providers (APPs -  Physician Assistants and Nurse Practitioners) who all work together to provide you with the care you need, when you need it.   Your next appointment:   3 week(s)  The format for your next appointment:   In Person  Provider:   [name], PA-C  Other Instructions  Monitor your blood pressure daily     Signed, [name], [place]  04/17/2020 11:39 PM    [place]

## 2020-04-15 NOTE — Patient Instructions (Signed)
Medication Instructions:  Your physician recommends that you continue on your current medications as directed. Please refer to the Current Medication list given to you today.  *If you need a refill on your cardiac medications before your next appointment, please call your pharmacy*  Lab Work: NONE ordered at this time of appointment   If you have labs (blood work) drawn today and your tests are completely normal, you will receive your results only by: Marland Kitchen MyChart Message (if you have MyChart) OR . A paper copy in the mail If you have any lab test that is abnormal or we need to change your treatment, we will call you to review the results.  Testing/Procedures: NONE ordered at this time of appointment   Follow-Up: At [facility], you and your health needs are our priority.  As part of our continuing mission to provide you with exceptional heart care, we have created designated Provider Care Teams.  These Care Teams include your primary Cardiologist (physician) and Advanced Practice Providers (APPs -  Physician Assistants and Nurse Practitioners) who all work together to provide you with the care you need, when you need it.   Your next appointment:   3 week(s)  The format for your next appointment:   In Person  Provider:   [name], PA-C  Other Instructions  Monitor your blood pressure daily

## 2020-04-16 DIAGNOSIS — T8189XA Other complications of procedures, not elsewhere classified, initial encounter: Secondary | ICD-10-CM | POA: Diagnosis not present

## 2020-04-17 ENCOUNTER — Encounter: Payer: Self-pay | Admitting: Physician Assistant

## 2020-04-23 ENCOUNTER — Telehealth: Payer: Self-pay

## 2020-04-23 DIAGNOSIS — I951 Orthostatic hypotension: Secondary | ICD-10-CM | POA: Diagnosis not present

## 2020-04-23 DIAGNOSIS — I130 Hypertensive heart and chronic kidney disease with heart failure and stage 1 through stage 4 chronic kidney disease, or unspecified chronic kidney disease: Secondary | ICD-10-CM | POA: Diagnosis not present

## 2020-04-23 DIAGNOSIS — Z431 Encounter for attention to gastrostomy: Secondary | ICD-10-CM | POA: Diagnosis not present

## 2020-04-23 DIAGNOSIS — J9811 Atelectasis: Secondary | ICD-10-CM | POA: Diagnosis not present

## 2020-04-23 DIAGNOSIS — I0981 Rheumatic heart failure: Secondary | ICD-10-CM | POA: Diagnosis not present

## 2020-04-23 DIAGNOSIS — I255 Ischemic cardiomyopathy: Secondary | ICD-10-CM | POA: Diagnosis not present

## 2020-04-23 DIAGNOSIS — H532 Diplopia: Secondary | ICD-10-CM | POA: Diagnosis not present

## 2020-04-23 DIAGNOSIS — M5136 Other intervertebral disc degeneration, lumbar region: Secondary | ICD-10-CM | POA: Diagnosis not present

## 2020-04-23 DIAGNOSIS — I69398 Other sequelae of cerebral infarction: Secondary | ICD-10-CM | POA: Diagnosis not present

## 2020-04-23 DIAGNOSIS — Z48812 Encounter for surgical aftercare following surgery on the circulatory system: Secondary | ICD-10-CM | POA: Diagnosis not present

## 2020-04-23 DIAGNOSIS — I69351 Hemiplegia and hemiparesis following cerebral infarction affecting right dominant side: Secondary | ICD-10-CM | POA: Diagnosis not present

## 2020-04-23 DIAGNOSIS — M47814 Spondylosis without myelopathy or radiculopathy, thoracic region: Secondary | ICD-10-CM | POA: Diagnosis not present

## 2020-04-23 DIAGNOSIS — I25118 Atherosclerotic heart disease of native coronary artery with other forms of angina pectoris: Secondary | ICD-10-CM | POA: Diagnosis not present

## 2020-04-23 DIAGNOSIS — T451X5D Adverse effect of antineoplastic and immunosuppressive drugs, subsequent encounter: Secondary | ICD-10-CM | POA: Diagnosis not present

## 2020-04-23 DIAGNOSIS — M8780 Other osteonecrosis, unspecified bone: Secondary | ICD-10-CM | POA: Diagnosis not present

## 2020-04-23 DIAGNOSIS — G40909 Epilepsy, unspecified, not intractable, without status epilepticus: Secondary | ICD-10-CM | POA: Diagnosis not present

## 2020-04-23 DIAGNOSIS — R1312 Dysphagia, oropharyngeal phase: Secondary | ICD-10-CM | POA: Diagnosis not present

## 2020-04-23 DIAGNOSIS — I5021 Acute systolic (congestive) heart failure: Secondary | ICD-10-CM | POA: Diagnosis not present

## 2020-04-23 DIAGNOSIS — I69354 Hemiplegia and hemiparesis following cerebral infarction affecting left non-dominant side: Secondary | ICD-10-CM | POA: Diagnosis not present

## 2020-04-23 DIAGNOSIS — I088 Other rheumatic multiple valve diseases: Secondary | ICD-10-CM | POA: Diagnosis not present

## 2020-04-23 DIAGNOSIS — N189 Chronic kidney disease, unspecified: Secondary | ICD-10-CM | POA: Diagnosis not present

## 2020-04-23 DIAGNOSIS — I700 Atherosclerosis of aorta: Secondary | ICD-10-CM | POA: Diagnosis not present

## 2020-04-23 DIAGNOSIS — E782 Mixed hyperlipidemia: Secondary | ICD-10-CM | POA: Diagnosis not present

## 2020-04-23 DIAGNOSIS — C7951 Secondary malignant neoplasm of bone: Secondary | ICD-10-CM | POA: Diagnosis not present

## 2020-04-23 DIAGNOSIS — C61 Malignant neoplasm of prostate: Secondary | ICD-10-CM | POA: Diagnosis not present

## 2020-04-23 NOTE — Telephone Encounter (Signed)
-----   Message from Ivin Poot, MD sent at 04/21/2020  9:58 AM EDT ----- Regarding: RE: PEG Tube I will call [facility] Estill Bamberg ----- Message ----- From: Donnella Sham, RN Sent: 04/20/2020   5:38 PM EDT To: Ivin Poot, MD, Marylen Ponto, LPN Subject: PEG Tube                                       Hey Dr. Prescott Gum,  I got a call from Estill Bamberg [phone] with [facility] who is frustrated with patient because he is non-compliant nutritionally and eating foods he should not.  She also wanted to know who was going to look after his tube feeds?  Does he need a GI consult?  She said she would be willing to call if you had a specialist in mind.  If I am not here, will you please respond to Manuela Schwartz?  Thanks,  Caryl Pina

## 2020-04-28 ENCOUNTER — Other Ambulatory Visit: Payer: Self-pay | Admitting: Cardiothoracic Surgery

## 2020-04-28 DIAGNOSIS — Z951 Presence of aortocoronary bypass graft: Secondary | ICD-10-CM

## 2020-04-29 ENCOUNTER — Other Ambulatory Visit: Payer: Self-pay

## 2020-04-29 ENCOUNTER — Encounter: Payer: Self-pay | Admitting: Cardiothoracic Surgery

## 2020-04-29 ENCOUNTER — Ambulatory Visit (INDEPENDENT_AMBULATORY_CARE_PROVIDER_SITE_OTHER): Payer: Self-pay | Admitting: Cardiothoracic Surgery

## 2020-04-29 ENCOUNTER — Ambulatory Visit
Admission: RE | Admit: 2020-04-29 | Discharge: 2020-04-29 | Disposition: A | Discharge status: Home / Self Care | Payer: PPO | Source: Ambulatory Visit | Attending: Cardiothoracic Surgery | Admitting: Cardiothoracic Surgery

## 2020-04-29 VITALS — BP 116/73 | HR 80 | Temp 97.5°F | Resp 20 | Ht 70.0 in | Wt 185.0 lb

## 2020-04-29 DIAGNOSIS — Z951 Presence of aortocoronary bypass graft: Secondary | ICD-10-CM | POA: Diagnosis not present

## 2020-04-29 DIAGNOSIS — Z09 Encounter for follow-up examination after completed treatment for conditions other than malignant neoplasm: Secondary | ICD-10-CM

## 2020-04-29 DIAGNOSIS — R531 Weakness: Secondary | ICD-10-CM | POA: Diagnosis not present

## 2020-04-29 DIAGNOSIS — R42 Dizziness and giddiness: Secondary | ICD-10-CM | POA: Diagnosis not present

## 2020-04-29 DIAGNOSIS — I2510 Atherosclerotic heart disease of native coronary artery without angina pectoris: Secondary | ICD-10-CM

## 2020-04-29 DIAGNOSIS — J984 Other disorders of lung: Secondary | ICD-10-CM | POA: Diagnosis not present

## 2020-04-29 MED ORDER — MIDODRINE HCL 5 MG PO TABS: 5.0000 mg | ORAL_TABLET | Freq: Two times a day (BID) | ORAL | 4 refills | Status: DC

## 2020-04-29 NOTE — Progress Notes (Signed)
PCP is Josetta Huddle, MD Referring Provider is Burnell Blanks*  Chief Complaint  Patient presents with  . Routine Post Op    f/u from surgery with CXR s/p CABG x3 on 03/27/20    HPI: Scheduled visit with chest x-ray 1 month after urgent CABG for ischemic cardiomyopathy and heart failure.  He is doing well.  Denies angina or symptoms of CHF.  Seen in the cardiology office and the plan is to wean off midodrine for soft blood pressures.  He is in sinus rhythm.  Surgical incisions are well-healed.  Chest x-ray today is clear.  He had a feeding tube gastrostomy  placed by IR which is working well.  He is scheduled to start outpatient cardiac rehab at [facility] which will work out well because  he can now drive and lift up to 10 pounds.  Past Medical History:  Diagnosis Date  . Arthritis    OA AND PAIN RT KNEE  . Cancer ([place])    h/o neck - ABOUT 6 YRS AGO - TX'D WITH RADIATION AND CHEMO   . Chronic kidney disease   . GERD without esophagitis 03/21/2020  . Head and neck cancer ~ 2009   S/P radiation & [name]  . Headache(784.0)   . Hyperlipidemia   . Hypertension   . Kidney carcinoma ([place])    h/o - NEPHRECTOMY   . 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 Prostate cancer ([place]) 05/20/14   Gleason 4+3=7, volume 25 gm  . Radiation 2015   hx of, prostate cancer  . Seizures ([place])    hx of x yrs, "the bad kind;bite tongue; STATES LAST [place] 2013; WAS SEEING [name] - HE RETIRED AND PT LAST SAW DR. [name]  . Stroke ([place]) [place] 2013   UNABLE TO SPEAK OR MOVE AND RT SIDE WEAKNESS AND LOSS OF SKIN SENSITIVITY TO HEAT AND COLD ON RT SIDE, DOUBLE VISION. BALANCE PROBLEMS---STATES STILL HAS DOUBLE VISION AND BALANCE PROBLEM AND RT SIDED LOSS OF SKIN SENSITIVITY    Past Surgical History:  Procedure Laterality Date  . CORONARY ARTERY BYPASS GRAFT N/A 03/27/2020   Procedure: CORONARY ARTERY BYPASS GRAFTING (CABG) x Three,  using left internal mammary artery and right leg greater saphenous vein harvested endoscopically;  Surgeon: Ivin Poot, MD;  Location: [place];  Service: Open Heart Surgery;  Laterality: N/A;  . hydrocelectomy  11/2000   left  . IR FLUORO GUIDE CV LINE RIGHT  08/31/2017  . IR GASTROSTOMY TUBE MOD SED  04/07/2020  . IR US GUIDE VASC ACCESS RIGHT  08/31/2017  . JOINT REPLACEMENT     LEFT TOTAL KNEE ARTHROPLASTY  . MOUTH SURGERY  2019  . NEPHRECTOMY  1990's   left  . PROSTATE BIOPSY  05/20/14   Gleason 4+3=7, vol 25 gm  . RIGHT/LEFT HEART CATH AND CORONARY ANGIOGRAPHY N/A 03/24/2020   Procedure: RIGHT/LEFT HEART CATH AND CORONARY ANGIOGRAPHY;  Surgeon: Burnell Blanks, MD;  Location: [place] CV LAB;  Service: Cardiovascular;  Laterality: N/A;  . TEE WITHOUT CARDIOVERSION  10/04/2011   Procedure: TRANSESOPHAGEAL ECHOCARDIOGRAM (TEE);  Surgeon: Candee Furbish, MD;  Location: [facility] ENDOSCOPY;  Service: Cardiovascular;  Laterality: N/A;  . TEE WITHOUT CARDIOVERSION N/A 03/27/2020   Procedure: TRANSESOPHAGEAL ECHOCARDIOGRAM (TEE);  Surgeon: Prescott Gum, Collier Salina, MD;  Location: [place];  Service: Open Heart Surgery;  Laterality: N/A;  . TOTAL KNEE ARTHROPLASTY  2011  left  . TOTAL KNEE ARTHROPLASTY Right 02/24/2014   Procedure: RIGHT TOTAL KNEE ARTHROPLASTY;  Surgeon: Gearlean Alf, MD;  [facility];  Service: Orthopedics;  Laterality: Right;    Family History  Problem Relation Age of Onset  . Diabetes Mother   . Heart disease Mother   . Heart disease Father     Social History Social History   Tobacco Use  . Smoking status: Never Smoker  . Smokeless tobacco: Never Used  Vaping Use  . Vaping Use: Never used  Substance Use Topics  . Alcohol use: Yes    Alcohol/week: 0.0 standard drinks    Comment: WAS 6 TO 8 BEERS DAILY   . Drug use: No    Current Outpatient Medications  Medication Sig Dispense Refill  . acetaminophen (TYLENOL) 160 MG/5ML solution Take 20.3 mLs (650 mg total)  by mouth every 6 (six) hours as needed for headache. 120 mL 0  . albuterol (VENTOLIN HFA) 108 (90 Base) MCG/ACT inhaler Inhale 2 puffs into the lungs in the morning, at noon, and at bedtime.    Marland Kitchen aspirin 81 MG chewable tablet Place 1 tablet (81 mg total) into feeding tube daily. 30 tablet 0  . diphenhydrAMINE (BENADRYL) 12.5 MG/5ML elixir Take 10 mLs (25 mg total) by mouth at bedtime as needed for sleep. 120 mL 0  . lamoTRIgine (LAMICTAL) 100 MG tablet TAKE 1 TABLET PER TUBE IN THE MORNING AND 2 TABLET PER TUBE IN THE EVENING. Must be seen for further refills. Call [phone] to schedule. 90 tablet 0  . Leuprolide Acetate, 3 Month, (LUPRON DEPOT, 71-MONTH, IM) Inject 1 each into the muscle every 3 (three) months.    . metroNIDAZOLE (FLAGYL) 500 MG tablet Take 500 mg by mouth 3 (three) times daily.    . midodrine (PROAMATINE) 5 MG tablet Place 1 tablet (5 mg total) into feeding tube 2 (two) times daily with a meal. 14 tablet 1  . Nutritional Supplements (FEEDING SUPPLEMENT, OSMOLITE 1.5 CAL,) LIQD Place 355 mLs into feeding tube 4 (four) times daily.  0  . pentoxifylline (TRENTAL) 400 MG CR tablet Take 400 mg by mouth 2 (two) times daily with a meal.    . rosuvastatin (CRESTOR) 20 MG tablet Place 1 tablet (20 mg total) into feeding tube daily. 90 tablet 3  . Amino Acids-Protein Hydrolys (FEEDING SUPPLEMENT, PRO-STAT SUGAR FREE 64,) LIQD Place 30 mLs into feeding tube 3 (three) times daily. (Patient not taking: Reported on 04/29/2020) 2700 mL 0   No current facility-administered medications for this visit.    Allergies  Allergen Reactions  . Morphine And Related Nausea And Vomiting    Review of Systems  Weight stable No fever Cough Choking spells No bleeding  BP 116/73 (BP Location: Right Arm, Patient Position: Sitting, Cuff Size: Normal)   Pulse 80   Temp (!) 97.5 F (36.4 C) (Temporal)   Resp 20   Ht 5\' 10"  (1.778 m)   Wt 185 lb (83.9 kg)   SpO2 97% Comment: RA  BMI 26.54 kg/m   Physical Exam      Exam    General- alert and comfortable sternal incision well-healed.    Neck- no JVD, no cervical adenopathy palpable, no carotid bruit   Lungs- clear without rales, wheezes   Cor- regular rate and rhythm, no murmur , gallop   Abdomen- soft, non-tender   Extremities - warm, non-tender, minimal edema   Neuro- oriented, appropriate, no focal weakness   Diagnostic Tests: Chest x-ray  clear Impression: Doing very well 1 month after multivessel CABG for ischemic cardiomyopathy.  His blood pressure should start to improve.  He understands he needs to walk 20 minutes daily and that he will be able to drive so that he can go to rehab at [facility]  Continue current medications including low-dose midodrine which will be weaned by cardiology.  Plan: Return in 8 weeks to review progress and discuss thing limits on activities.   [name], MD [facility] [phone]

## 2020-05-04 ENCOUNTER — Ambulatory Visit: Payer: PPO | Admitting: Physician Assistant

## 2020-05-04 ENCOUNTER — Encounter: Payer: Self-pay | Admitting: Physician Assistant

## 2020-05-04 ENCOUNTER — Other Ambulatory Visit: Payer: Self-pay

## 2020-05-04 VITALS — BP 131/83 | HR 88 | Ht 71.0 in | Wt 187.4 lb

## 2020-05-04 DIAGNOSIS — E785 Hyperlipidemia, unspecified: Secondary | ICD-10-CM

## 2020-05-04 DIAGNOSIS — M272 Inflammatory conditions of jaws: Secondary | ICD-10-CM | POA: Diagnosis not present

## 2020-05-04 DIAGNOSIS — C61 Malignant neoplasm of prostate: Secondary | ICD-10-CM | POA: Diagnosis not present

## 2020-05-04 DIAGNOSIS — I25810 Atherosclerosis of coronary artery bypass graft(s) without angina pectoris: Secondary | ICD-10-CM | POA: Diagnosis not present

## 2020-05-04 DIAGNOSIS — I951 Orthostatic hypotension: Secondary | ICD-10-CM | POA: Diagnosis not present

## 2020-05-04 DIAGNOSIS — Z8673 Personal history of transient ischemic attack (TIA), and cerebral infarction without residual deficits: Secondary | ICD-10-CM

## 2020-05-04 DIAGNOSIS — I255 Ischemic cardiomyopathy: Secondary | ICD-10-CM | POA: Diagnosis not present

## 2020-05-04 NOTE — Progress Notes (Signed)
Cardiology Office Note:    Date:  05/06/2020   ID:  Connor [name], DOB 08-13-1946, MRN [id number]  PCP:  Josetta Huddle, MD  [facility] Cardiologist:  Pixie Casino, MD  [place] Electrophysiologist:  None   Referring MD: Josetta Huddle, MD   Chief Complaint  Patient presents with  . Follow-up    seen for Dr. Debara Pickett    History of Present Illness:    Connor [name] is a 74 y.o. 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  His oncologist is with [facility] cancer center Dr. Lavera Guise.  He has been receiving Lupron injections for several years.  Patient was admitted in May 2021 with new onset of heart failure.  Echocardiogram showed new LV dysfunction with EF 25 to 30%, grade 2 DD, trace AI.  Subsequent cardiac catheterization revealed triple-vessel disease with 99% ostial RCA, 80% proximal RCA, 80% ostial to proximal LAD, 70% D1, 50% ostial to proximal left circumflex lesion, 90% ostial left main.  Patient was subsequently referred to cardiothoracic surgery.  He was seen by Dr. Lucianne Lei trigt and underwent CABG x3 with LIMA to LAD, SVG to OM and SVG to PDA on 03/27/2020.  Postprocedure, he recovered well.  When I saw the patient back on 04/15/2020, he was still on midodrine for orthostatic hypotension.  In order to add heart failure therapy, I recommended weaning him off of Midrin first.  He presents today for cardiology office visit.  His blood pressure still occasionally dipped down to the 90s.  He managed to take him off of the nighttime of midodrine, right now he is only taking 5 mg of midodrine in the  morning.  Hopefully if his systolic blood pressure stays above 100 mmHg, we can later wean his Midrin down to 2.5 mg daily.  Otherwise he denies any recent chest pain or shortness of breath.  He is able to continue to exercise without issue.  His surgical scar looks very well-healed.  Unfortunately at this time I cannot add any heart failure therapy due to intermittent hypotension.  He is to proceed with a limited echocardiogram in early September before follow-up with Dr. Debara Pickett afterward.  If ejection fraction still low, I would recommend to consider addition of digoxin +/- [place].   Past Medical History:  Diagnosis Date  . Arthritis    OA AND PAIN RT KNEE  . Cancer ([place])    h/o neck - ABOUT 6 YRS AGO - TX'D WITH RADIATION AND CHEMO   . Chronic kidney disease   . GERD without esophagitis 03/21/2020  . Head and neck cancer ~ 2009   S/P radiation & Connor [name]  . Headache(784.0)   . Hyperlipidemia   . Hypertension   . Kidney carcinoma ([place])    h/o - NEPHRECTOMY   . Osteonecrosis of jaw ([place])    Secondary to radiation therapy  . Prostate cancer (Connor [name]) 05/20/14   Gleason 4+3=7, volume 25 gm  . Radiation 2015   hx of, prostate cancer  . Seizures ([place])    hx of x yrs, "the bad kind;bite tongue; STATES LAST [place] 2013; WAS SEEING [name] - HE RETIRED AND PT LAST SAW  DR. [name]  . Stroke ([place]) [place] 2013   UNABLE TO SPEAK OR MOVE AND RT SIDE WEAKNESS AND LOSS OF SKIN SENSITIVITY TO HEAT AND COLD ON RT SIDE, DOUBLE VISION. BALANCE PROBLEMS---STATES STILL HAS DOUBLE VISION AND BALANCE PROBLEM AND RT SIDED LOSS OF SKIN SENSITIVITY    Past Surgical History:  Procedure Laterality Date  . CORONARY ARTERY BYPASS GRAFT N/A 03/27/2020   Procedure: CORONARY ARTERY BYPASS GRAFTING (CABG) x Three, using left internal mammary artery and right leg greater saphenous vein harvested endoscopically;  Surgeon: Ivin Poot, MD;  Location: [place];  Service: Open Heart  Surgery;  Laterality: N/A;  . hydrocelectomy  11/2000   left  . IR FLUORO GUIDE CV LINE RIGHT  08/31/2017  . IR GASTROSTOMY TUBE MOD SED  04/07/2020  . IR US GUIDE VASC ACCESS RIGHT  08/31/2017  . JOINT REPLACEMENT     LEFT TOTAL KNEE ARTHROPLASTY  . MOUTH SURGERY  2019  . NEPHRECTOMY  1990's   left  . PROSTATE BIOPSY  05/20/14   Gleason 4+3=7, vol 25 gm  . RIGHT/LEFT HEART CATH AND CORONARY ANGIOGRAPHY N/A 03/24/2020   Procedure: RIGHT/LEFT HEART CATH AND CORONARY ANGIOGRAPHY;  Surgeon: Burnell Blanks, MD;  Location: [place] [facility];  Service: Cardiovascular;  Laterality: N/A;  . TEE WITHOUT CARDIOVERSION  10/04/2011   Procedure: TRANSESOPHAGEAL ECHOCARDIOGRAM (TEE);  Surgeon: Candee Furbish, MD;  [facility] ENDOSCOPY;  Service: Cardiovascular;  Laterality: N/A;  . TEE WITHOUT CARDIOVERSION N/A 03/27/2020   Procedure: TRANSESOPHAGEAL ECHOCARDIOGRAM (TEE);  Surgeon: Prescott Gum, Collier Salina, MD;  Location: [place];  Service: Open Heart Surgery;  Laterality: N/A;  . TOTAL KNEE ARTHROPLASTY  2011   left  . TOTAL KNEE ARTHROPLASTY Right 02/24/2014   Procedure: RIGHT TOTAL KNEE ARTHROPLASTY;  Surgeon: Gearlean Alf, MD;  Location: [facility];  Service: Orthopedics;  Laterality: Right;    Current Medications: Current Meds  Medication Sig  . acetaminophen (TYLENOL) 500 MG tablet Take 500 mg by mouth every 6 (six) hours as needed.  Marland Kitchen albuterol (VENTOLIN HFA) 108 (90 Base) MCG/ACT inhaler Inhale 2 puffs into the lungs in the morning, at noon, and at bedtime.  Marland Kitchen aspirin 81 MG chewable tablet Place 1 tablet (81 mg total) into feeding tube daily.  . diphenhydrAMINE (BENADRYL) 12.5 MG/5ML elixir Take 10 mLs (25 mg total) by mouth at bedtime as needed for sleep.  Marland Kitchen lamoTRIgine (LAMICTAL) 100 MG tablet TAKE 1 TABLET PER TUBE IN THE MORNING AND 2 TABLET PER TUBE IN THE EVENING. Must be seen for further refills. Call [phone] to schedule.  . metroNIDAZOLE (FLAGYL) 500 MG tablet Take 500 mg by mouth 3  (three) times daily.  . midodrine (PROAMATINE) 5 MG tablet Place 1 tablet (5 mg total) into feeding tube 2 (two) times daily with a meal.  . Nutritional Supplements (FEEDING SUPPLEMENT, OSMOLITE 1.5 CAL,) LIQD Place 355 mLs into feeding tube 4 (four) times daily.  . rosuvastatin (CRESTOR) 20 MG tablet Place 1 tablet (20 mg total) into feeding tube daily.     Allergies:   Morphine and related   Social History   Socioeconomic History  . Marital status: Divorced    Spouse name: Not on file  . Number of children: 2  . Years of education: 12th  . Highest education level: Not on file  Occupational History  . Occupation: Information systems manager: OTHER    Comment: [employer]  Tobacco Use  . Smoking status: Never  Smoker  . Smokeless tobacco: Never Used  Vaping Use  . Vaping Use: Never used  Substance and Sexual Activity  . Alcohol use: Yes    Alcohol/week: 0.0 standard drinks    Comment: WAS 6 TO 8 BEERS DAILY   . Drug use: No  . Sexual activity: Not Currently  Other Topics Concern  . Not on file  Social History Narrative   Patient is divorced with 2 children.   Patient is right handed.   Patient has hs education.   Patient drinks 2 cups daily.   Social Determinants of Health   Financial Resource Strain:   . Difficulty of Paying Living Expenses:   Food Insecurity:   . Worried About Charity fundraiser in the Last Year:   . Arboriculturist in the Last Year:   Transportation Needs:   . Film/video editor (Medical):   Marland Kitchen Lack of Transportation (Non-Medical):   Physical Activity:   . Days of Exercise per Week:   . Minutes of Exercise per Session:   Stress:   . Feeling of Stress :   Social Connections:   . Frequency of Communication with Friends and Family:   . Frequency of Social Gatherings with Friends and Family:   . Attends Religious Services:   . Active Member of Clubs or Organizations:   . Attends Archivist Meetings:   Marland Kitchen Marital Status:       Family History: The patient's family history includes Diabetes in his mother; Heart disease in his father and mother.  ROS:   Please see the history of present illness.     All other systems reviewed and are negative.  EKGs/Labs/Other Studies Reviewed:    The following studies were reviewed today:  Echo 03/22/2020 1. Akinesis of the anteroseptal, apical and basal inferior walls with  overall severe LV dysfunction; grade 2 diastolic dysfunction; mild LVE;  trace AI.  2. Left ventricular ejection fraction, by estimation, is 25 to 30%. The  left ventricle has severely decreased function. The left ventricle  demonstrates regional wall motion abnormalities (see scoring  diagram/findings for description). The left  ventricular internal cavity size was mildly dilated. Left ventricular  diastolic parameters are consistent with Grade II diastolic dysfunction  (pseudonormalization). Elevated left atrial pressure.  3. Right ventricular systolic function is normal. The right ventricular  size is normal. There is mildly elevated pulmonary artery systolic  pressure.  4. The mitral valve is normal in structure. No evidence of mitral valve  regurgitation. No evidence of mitral stenosis.  5. The aortic valve is normal in structure. Aortic valve regurgitation is  trivial. Mild to moderate aortic valve sclerosis/calcification is present,  without any evidence of aortic stenosis.  6. The inferior vena cava is normal in size with greater than 50%  respiratory variability, suggesting right atrial pressure of 3 mmHg.    Cath 03/24/2020  Ost RCA to Prox RCA lesion is 99% stenosed.  Prox RCA lesion is 80% stenosed.  Mid RCA to Dist RCA lesion is 20% stenosed.  Ost LAD to Prox LAD lesion is 80% stenosed.  Ost Cx to Prox Cx lesion is 50% stenosed.  1st Diag lesion is 70% stenosed.  Dist LAD lesion is 30% stenosed.  Ost LM to Ost LAD lesion is 90% stenosed.  Severe triple vessel CAD  with severe distal left main stenosis, severe ostial LAD stenosis and severe ostial RCA/mid RCA stenosis.  Elevated filling pressures consistent with continued volume overload.  Recommendations: Will consult CT surgery for CABG. Will resume IV Lasix tonight  EKG:  EKG is not ordered today.   Recent Labs: 03/21/2020: B Natriuretic Peptide 3,210.4 03/26/2020: TSH 1.157 03/28/2020: ALT 14; Magnesium 2.2 04/07/2020: BUN 21; Creatinine, Ser 1.10; Hemoglobin 9.4; Platelets 322; Potassium 3.7; Sodium 133  Recent Lipid Panel    Component Value Date/Time   CHOL 149 12/05/2011 0730   TRIG 123 12/05/2011 0730   HDL 56 12/05/2011 0730   CHOLHDL 2.7 12/05/2011 0730   VLDL 25 12/05/2011 0730   LDLCALC 68 12/05/2011 0730    Physical Exam:    VS:  BP 131/83   Pulse 88   Ht 5\' 11"  (1.803 m)   Wt 187 lb 6.4 oz (85 kg)   BMI 26.14 kg/m     Wt Readings from Last 3 Encounters:  05/04/20 187 lb 6.4 oz (85 kg)  04/29/20 185 lb (83.9 kg)  04/15/20 192 lb 9.6 oz (87.4 kg)     GEN:  Well nourished, well developed in no acute distress HEENT: Normal NECK: No JVD; No carotid bruits LYMPHATICS: No lymphadenopathy CARDIAC: RRR, no murmurs, rubs, gallops RESPIRATORY:  Clear to auscultation without rales, wheezing or rhonchi  ABDOMEN: Soft, non-tender, non-distended MUSCULOSKELETAL:  No edema; No deformity  SKIN: Warm and dry NEUROLOGIC:  Alert and oriented x 3 PSYCHIATRIC:  Normal affect   ASSESSMENT:    1. Coronary artery disease involving coronary bypass graft of native heart without angina pectoris   2. Ischemic cardiomyopathy   3. Orthostatic hypotension   4. Hyperlipidemia LDL goal <70   5. H/O: CVA (cerebrovascular accident)   6. Chronic osteomyelitis of mandible   7. Malignant neoplasm of prostate ([place])    PLAN:    In order of problems listed above:  1. CAD s/p CABG: He is recovering well after the recent bypass surgery  2. Orthostatic hypotension: He is currently on 5 mg once a  day of midodrine, hopefully if his systolic blood pressure remain above 168mmHg, he can reduce the midodrine to 2.5 mg daily.  3. Hyperlipidemia: Continue statin therapy.  4. History of CVA: No recent recurrence  5. Chronic osteomyelitis of mandible: S/p repair  6. Prostate cancer: Followed by oncology service at [facility]  7. Ischemic cardiomyopathy: Unable to add any heart failure therapy due to orthostatic hypotension.  We will schedule limited echocardiogram for September.   Medication Adjustments/Labs and Tests Ordered: Current medicines are reviewed at length with the patient today.  Concerns regarding medicines are outlined above.  Orders Placed This Encounter  Procedures  . ECHOCARDIOGRAM LIMITED   No orders of the defined types were placed in this encounter.   Patient Instructions  Medication Instructions:   Your physician recommends that you continue on your current medications as directed. Please refer to the Current Medication list given to you today.   *If you need a refill on your cardiac medications before your next appointment, please call your pharmacy*  Lab Work: NONE ordered at this time of appointment   If you have labs (blood work) drawn today and your tests are completely normal, you will receive your results only by: Marland Kitchen MyChart Message (if you have MyChart) OR . A paper copy in the mail If you have any lab test that is abnormal or we need to change your treatment, we will call you to review the results.  Testing/Procedures: Your physician has requested that you have an echocardiogram. Echocardiography is a painless test that uses  sound waves to create images of your heart. It provides your doctor with information about the size and shape of your heart and how well your heart's chambers and valves are working. This procedure takes approximately one hour. There are no restrictions for this procedure. This test is performed at [address],  [address]  Please schedule for September 2021   Follow-Up: At [facility], you and your health needs are our priority.  As part of our continuing mission to provide you with exceptional heart care, we have created designated Provider Care Teams.  These Care Teams include your primary Cardiologist (physician) and Advanced Practice Providers (APPs -  Physician Assistants and Nurse Practitioners) who all work together to provide you with the care you need, when you need it.  Your next appointment:   Follow up after Echocardiogram   The format for your next appointment:   In Person  Provider:   K. Mali Hilty, MD  Other Instructions  CONTINUE to monitor your blood pressure (BP). If your systolic (Top) number of BP is persistently greater than 100 DECREASE Midodrine to 2.5 mg daily     Signed, [name], [place]  05/06/2020 10:40 PM    [facility]

## 2020-05-04 NOTE — Patient Instructions (Addendum)
Medication Instructions:   Your physician recommends that you continue on your current medications as directed. Please refer to the Current Medication list given to you today.   *If you need a refill on your cardiac medications before your next appointment, please call your pharmacy*  Lab Work: NONE ordered at this time of appointment   If you have labs (blood work) drawn today and your tests are completely normal, you will receive your results only by: Marland Kitchen MyChart Message (if you have MyChart) OR . A paper copy in the mail If you have any lab test that is abnormal or we need to change your treatment, we will call you to review the results.  Testing/Procedures: Your physician has requested that you have an echocardiogram. Echocardiography is a painless test that uses sound waves to create images of your heart. It provides your doctor with information about the size and shape of your heart and how well your heart's chambers and valves are working. This procedure takes approximately one hour. There are no restrictions for this procedure. This test is performed at [address]  Please schedule for September 2021   Follow-Up: At [facility], you and your health needs are our priority.  As part of our continuing mission to provide you with exceptional heart care, we have created designated Provider Care Teams.  These Care Teams include your primary Cardiologist (physician) and Advanced Practice Providers (APPs -  Physician Assistants and Nurse Practitioners) who all work together to provide you with the care you need, when you need it.  Your next appointment:   Follow up after Echocardiogram   The format for your next appointment:   In Person  Provider:   K. Mali Hilty, MD  Other Instructions  CONTINUE to monitor your blood pressure (BP). If your systolic (Top) number of BP is persistently greater than 100 DECREASE Midodrine to 2.5 mg daily

## 2020-05-06 ENCOUNTER — Encounter: Payer: Self-pay | Admitting: Physician Assistant

## 2020-05-11 ENCOUNTER — Telehealth: Payer: Self-pay | Admitting: *Deleted

## 2020-05-11 NOTE — Telephone Encounter (Signed)
Patient would like to cancel his appointment with Dr Baxter Flattery. He is scheduled follow up at [facility] Thursday 7/22 (they adjusted his antibiotic regimen, which he completed a few days ago). He reports no pain or issues in his jaw "for quite some time."  He will continue to follow with [facility], will call [facility] if there are any other questions. Landis Gandy, RN

## 2020-05-12 ENCOUNTER — Telehealth: Payer: Self-pay

## 2020-05-12 NOTE — Telephone Encounter (Signed)
-----   Message from Ivin Poot, MD sent at 05/08/2020  6:05 PM EDT ----- Regarding: RE: PEG Tube Contact: [phone] Continue with osmolite and careful oral intake- he has been deal;ing with this for years ----- Message ----- From: Marylen Ponto, LPN Sent: 4/58/0998   2:49 PM EDT To: Ivin Poot, MD Subject: PEG Francesco Runner                                       Erin speech therapist with [facility] called today to let us know that she is D/C'ing Mr Seymore from home-health services due to him not being homebound. He is out and about and driving. Mr Rosiak has a Peg-tube still in place. Dr Wallis Mart -GI/MD does not manage the tube or it's removal per home-health. He is scheduled to see you back on 06/24/20 with CXR. Do you have any recommendations for this PEG tube management?  The ST-Erin with [facility] did say that she felt like he needs to continue with out pt -speech therapy. She states that hei s non-compliant with the peg-tube.  I called Mr. Cloninger and he states that he bolus 4-5 bottles of osmolite daily, and also drinking and eating po soft foods daily. Do we need to refer him to another GI MD?  Please advise Linden Dolin

## 2020-05-13 ENCOUNTER — Ambulatory Visit: Payer: PPO | Admitting: Internal Medicine

## 2020-05-14 DIAGNOSIS — M272 Inflammatory conditions of jaws: Secondary | ICD-10-CM | POA: Diagnosis not present

## 2020-05-14 DIAGNOSIS — Z09 Encounter for follow-up examination after completed treatment for conditions other than malignant neoplasm: Secondary | ICD-10-CM | POA: Diagnosis not present

## 2020-05-14 DIAGNOSIS — Y842 Radiological procedure and radiotherapy as the cause of abnormal reaction of the patient, or of later complication, without mention of misadventure at the time of the procedure: Secondary | ICD-10-CM | POA: Diagnosis not present

## 2020-05-16 DIAGNOSIS — M272 Inflammatory conditions of jaws: Secondary | ICD-10-CM | POA: Diagnosis not present

## 2020-05-16 DIAGNOSIS — E440 Moderate protein-calorie malnutrition: Secondary | ICD-10-CM | POA: Diagnosis not present

## 2020-05-19 DIAGNOSIS — C61 Malignant neoplasm of prostate: Secondary | ICD-10-CM | POA: Diagnosis not present

## 2020-05-20 ENCOUNTER — Other Ambulatory Visit: Payer: Self-pay | Admitting: Adult Health

## 2020-05-20 ENCOUNTER — Ambulatory Visit (INDEPENDENT_AMBULATORY_CARE_PROVIDER_SITE_OTHER): Payer: PPO | Admitting: Adult Health

## 2020-05-20 ENCOUNTER — Encounter: Payer: Self-pay | Admitting: Adult Health

## 2020-05-20 ENCOUNTER — Other Ambulatory Visit: Payer: Self-pay

## 2020-05-20 VITALS — BP 138/90 | Temp 98.1°F | Wt 193.0 lb

## 2020-05-20 DIAGNOSIS — Z951 Presence of aortocoronary bypass graft: Secondary | ICD-10-CM

## 2020-05-20 DIAGNOSIS — I10 Essential (primary) hypertension: Secondary | ICD-10-CM | POA: Diagnosis not present

## 2020-05-20 DIAGNOSIS — Z7689 Persons encountering health services in other specified circumstances: Secondary | ICD-10-CM

## 2020-05-20 DIAGNOSIS — G40909 Epilepsy, unspecified, not intractable, without status epilepticus: Secondary | ICD-10-CM | POA: Diagnosis not present

## 2020-05-20 DIAGNOSIS — M272 Inflammatory conditions of jaws: Secondary | ICD-10-CM | POA: Diagnosis not present

## 2020-05-20 DIAGNOSIS — I5021 Acute systolic (congestive) heart failure: Secondary | ICD-10-CM

## 2020-05-20 DIAGNOSIS — E782 Mixed hyperlipidemia: Secondary | ICD-10-CM

## 2020-05-20 DIAGNOSIS — I25810 Atherosclerosis of coronary artery bypass graft(s) without angina pectoris: Secondary | ICD-10-CM | POA: Diagnosis not present

## 2020-05-20 DIAGNOSIS — I951 Orthostatic hypotension: Secondary | ICD-10-CM | POA: Diagnosis not present

## 2020-05-20 DIAGNOSIS — I639 Cerebral infarction, unspecified: Secondary | ICD-10-CM | POA: Diagnosis not present

## 2020-05-20 MED ORDER — CHLORHEXIDINE GLUCONATE 0.12% ORAL RINSE (MEDLINE KIT): 15.0000 mL | Freq: Two times a day (BID) | OROMUCOSAL | 0 refills | Status: DC

## 2020-05-20 NOTE — Patient Instructions (Addendum)
It was great seeing you today   Please follow up with your specialists as directed.   Please follow up with me in 3-4 months for a physical exam

## 2020-05-20 NOTE — Progress Notes (Signed)
Patient presents to clinic today to establish care. 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   Acute Concerns: Establish Care   Chronic Issues: Epilepsy - Started having seizures in the early 1990's. Is currently on Lamictal and has not had a seizure in multiple years. Is follow up Neurology   CAD/S/p CABG/CHF was admitted in May 2021 with new onset of heart failure.  Echocardiogram showed new LV dysfunction with an EF of 25 to 30%, grade 2 DD, trace AI.Cardiac catheterization revealed triple-vessel disease with 99% ostial RCA, 80% proximal RCA, 80% ostial to proximal LAD, 70% D1, 50% ostial to proximal left circumflex lesion, 90% ostial left main.  He was seen by Dr. Prescott Gum and underwent CABG x3 with LIMA to LAD, SVG to OM and SVG to PDA on 03/27/2020.  Hyperlipidemia - takes crestor 40 mg daily. 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  Has weaned himself of Midodrone, his last dose was 10 days ago. BP have been stable at home. No dizziness or lightheadedness and overall feels much better then when he was taking midodrine  BP Readings from Last 3 Encounters:  05/20/20 (!) 138/90  05/04/20 131/83  04/29/20 116/73   History of CVA - 6 year ago? Continue to have balance issues but no recent falls due to CVA. Reports falls due to hypotension   Prostate Cancer - Followed by Oncology service at [facility]. Is taking Lupron injections. Prior  to lupron he went through radiation but PSA's started to go back up and he was started on Lupron.   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  In April 2021 had debridement of the left mandible and ORIF of left mandible fracture with reconstructive plate.  Was seen by Dr. Graylon Good with infectious disease and was prescribed Levaquin and Flagyl recently finished that treatment.  Feels as though the jaw surgery is healing well but continues to have trouble chewing and swallowing.  Does have a PEG tube that he does home feedings. Has follow up with Dentist in mid August    Health Maintenance: Dental -- Routine  Vision -- Routine Immunizations -- UTD  Colonoscopy -- UTD - last was in 2015? Is seen by Dr. Cristina Gong     Past Medical History:  Diagnosis Date  . Arthritis    OA AND PAIN RT KNEE  . Cancer ([place])    h/o neck - ABOUT 6 YRS AGO - TX'D WITH RADIATION AND CHEMO   . Chronic kidney disease   . GERD without esophagitis 03/21/2020  . Head and neck cancer ~ 2009   S/P radiation & [name]  . Headache(784.0)   . Hyperlipidemia   . Hypertension   . Kidney carcinoma ([place])    h/o - NEPHRECTOMY   . 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 Prostate cancer ([place]) 05/20/14   Gleason 4+3=7, volume 25 gm  .  Radiation 2015   hx of, prostate cancer  . Seizures ([place])    hx of x yrs, "the bad kind;bite tongue; STATES LAST [place] 2013; WAS SEEING [name] - HE RETIRED AND PT LAST SAW DR. [name]  . Stroke ([place]) [place] 2013   UNABLE TO SPEAK OR MOVE AND RT SIDE WEAKNESS AND LOSS OF SKIN SENSITIVITY TO HEAT AND COLD ON RT SIDE, DOUBLE VISION. BALANCE PROBLEMS---STATES STILL HAS DOUBLE VISION AND BALANCE PROBLEM AND RT SIDED LOSS OF SKIN SENSITIVITY    Past Surgical History:  Procedure Laterality Date  . CORONARY ARTERY BYPASS GRAFT N/A 03/27/2020   Procedure: CORONARY ARTERY BYPASS GRAFTING (CABG)  x Three, using left internal mammary artery and right leg greater saphenous vein harvested endoscopically;  Surgeon: Ivin Poot, MD;  Location: [facility];  Service: Open Heart Surgery;  Laterality: N/A;  . hydrocelectomy  11/2000   left  . IR FLUORO GUIDE CV LINE RIGHT  08/31/2017  . IR GASTROSTOMY TUBE MOD SED  04/07/2020  . IR US GUIDE VASC ACCESS RIGHT  08/31/2017  . JOINT REPLACEMENT     LEFT TOTAL KNEE ARTHROPLASTY  . MOUTH SURGERY  2019  . NEPHRECTOMY  1990's   left  . PROSTATE BIOPSY  05/20/14   Gleason 4+3=7, vol 25 gm  . RIGHT/LEFT HEART CATH AND CORONARY ANGIOGRAPHY N/A 03/24/2020   Procedure: RIGHT/LEFT HEART CATH AND CORONARY ANGIOGRAPHY;  Surgeon: Burnell Blanks, MD;  Location: [facility] CV [facility];  Service: Cardiovascular;  Laterality: N/A;  . TEE WITHOUT CARDIOVERSION  10/04/2011   Procedure: TRANSESOPHAGEAL ECHOCARDIOGRAM (TEE);  Surgeon: Candee Furbish, MD;  Location: [facility] ENDOSCOPY;  Service: Cardiovascular;  Laterality: N/A;  . TEE WITHOUT CARDIOVERSION N/A 03/27/2020   Procedure: TRANSESOPHAGEAL ECHOCARDIOGRAM (TEE);  Surgeon: Prescott Gum, Collier Salina, MD;  Location: [facility];  Service: Open Heart Surgery;  Laterality: N/A;  . TOTAL KNEE ARTHROPLASTY  2011   left  . TOTAL KNEE ARTHROPLASTY Right 02/24/2014   Procedure: RIGHT TOTAL KNEE ARTHROPLASTY;  Surgeon: Gearlean Alf, MD;  Location: [facility];  Service: Orthopedics;  Laterality: Right;    Current Outpatient Medications on File Prior to Visit  Medication Sig Dispense Refill  . acetaminophen (TYLENOL) 500 MG tablet Take 500 mg by mouth every 6 (six) hours as needed.    Marland Kitchen albuterol (VENTOLIN HFA) 108 (90 Base) MCG/ACT inhaler Inhale 2 puffs into the lungs in the morning, at noon, and at bedtime.    . diphenhydrAMINE (BENADRYL) 12.5 MG/5ML elixir Take 10 mLs (25 mg total) by mouth at bedtime as needed for sleep. 120 mL 0  . lamoTRIgine (LAMICTAL) 100 MG tablet TAKE 1 TABLET PER TUBE IN THE MORNING AND 2 TABLET PER TUBE IN THE  EVENING. Must be seen for further refills. Call [phone] to schedule. 90 tablet 0  . metroNIDAZOLE (FLAGYL) 500 MG tablet Take 500 mg by mouth 3 (three) times daily.    . midodrine (PROAMATINE) 5 MG tablet Place 1 tablet (5 mg total) into feeding tube 2 (two) times daily with a meal. 28 tablet 4  . Nutritional Supplements (FEEDING SUPPLEMENT, OSMOLITE 1.5 CAL,) LIQD Place 355 mLs into feeding tube 4 (four) times daily.  0  . rosuvastatin (CRESTOR) 20 MG tablet Place 1 tablet (20 mg total) into feeding tube daily. 90 tablet 3   No current facility-administered medications on file prior to visit.    Allergies  Allergen Reactions  . Morphine And Related Nausea And Vomiting  Family History  Problem Relation Age of Onset  . Diabetes Mother   . Heart disease Mother   . Heart disease Father     Social History   Socioeconomic History  . Marital status: Divorced    Spouse name: Not on file  . Number of children: 2  . Years of education: 12th  . Highest education level: Not on file  Occupational History  . Occupation: Information systems manager: OTHER    Comment: [employer]  Tobacco Use  . Smoking status: Never Smoker  . Smokeless tobacco: Never Used  Vaping Use  . Vaping Use: Never used  Substance and Sexual Activity  . Alcohol use: Yes    Alcohol/week: 3.0 standard drinks    Types: 3 Cans of beer per week    Comment: 3 beers a day   . Drug use: No  . Sexual activity: Not Currently  Other Topics Concern  . Not on file  Social History Narrative   Patient is divorced with 2 children.   Patient is right handed.   Patient has hs education.   Patient drinks 2 cups daily.   Social Determinants of Health   Financial Resource Strain:   . Difficulty of Paying Living Expenses:   Food Insecurity:   . Worried About Charity fundraiser in the Last Year:   . Arboriculturist in the Last Year:   Transportation Needs:   . Film/video editor (Medical):   Marland Kitchen Lack of  Transportation (Non-Medical):   Physical Activity:   . Days of Exercise per Week:   . Minutes of Exercise per Session:   Stress:   . Feeling of Stress :   Social Connections:   . Frequency of Communication with Friends and Family:   . Frequency of Social Gatherings with Friends and Family:   . Attends Religious Services:   . Active Member of Clubs or Organizations:   . Attends Archivist Meetings:   Marland Kitchen Marital Status:   Intimate Partner Violence:   . Fear of Current or Ex-Partner:   . Emotionally Abused:   Marland Kitchen Physically Abused:   . Sexually Abused:     Review of Systems  Constitutional: Negative.   HENT: Negative.   Eyes: Negative.   Respiratory: Negative.   Cardiovascular: Negative.   Gastrointestinal: Negative.   Genitourinary: Negative.   Musculoskeletal: Positive for joint pain (chronic ).  Skin: Negative.   Neurological: Negative.   Endo/Heme/Allergies: Negative.   Psychiatric/Behavioral: Negative.   All other systems reviewed and are negative.   BP (!) 138/90   Temp 98.1 F (36.7 C)   Wt 193 lb (87.5 kg)   BMI 26.92 kg/m   Physical Exam Vitals and nursing note reviewed.  Constitutional:      Appearance: Normal appearance.  HENT:     Head:     Jaw: Pain on movement present.     Comments: Deformity noted to left jaw. Surgical scar well healed     Mouth/Throat:     Mouth: Mucous membranes are moist.     Dentition: Abnormal dentition.     Pharynx: Oropharynx is clear. No oropharyngeal exudate or posterior oropharyngeal erythema.  Cardiovascular:     Rate and Rhythm: Normal rate and regular rhythm.     Pulses: Normal pulses.     Heart sounds: Normal heart sounds.  Pulmonary:     Effort: Pulmonary effort is normal.     Breath sounds: Normal breath  sounds.  Abdominal:     General: Abdomen is flat. Bowel sounds are normal. There is no distension.     Palpations: Abdomen is soft.     Tenderness: There is no abdominal tenderness.     Comments: PEG  tube intact. No signs of infection.   Musculoskeletal:        General: Normal range of motion.  Skin:    General: Skin is warm and dry.     Comments: Surgical scar on right knee and chest  Neurological:     General: No focal deficit present.     Mental Status: He is alert and oriented to person, place, and time.  Psychiatric:        Mood and Affect: Mood normal.        Behavior: Behavior normal.        Thought Content: Thought content normal.        Judgment: Judgment normal.      Assessment/Plan: 1. Encounter to establish care - Follow up in 3-4 months for CPE or sooner if needed  2. S/P CABG x 3 - Follow up with Cardiology as directed - Continue with statin   3. Coronary artery disease involving coronary bypass graft of native heart without angina pectoris - Follow up with Cardiology as directed - Continue with statin   4. Acute systolic CHF (congestive heart failure), NYHA class 3 (HCC) -Currently off midodrine.  Follow-up with cardiology as directed  5. Orthostatic hypotension - Resolved   6. Chronic osteomyelitis of mandible -Follow-up with oral surgery, dentist, and infectious disease as directed  7. Mixed hyperlipidemia - Continue with statin   8. Stroke, acute, embolic ([place]) - Follow up with neurology as directed  9. Essential hypertension - Not currently on medications d/t hypotension  - Continue to monitor   10. Seizure disorder ([place]) - Continue with Lamictal and follow up with cardiology as directed  Dorothyann Peng, NP

## 2020-05-21 DIAGNOSIS — C7951 Secondary malignant neoplasm of bone: Secondary | ICD-10-CM | POA: Diagnosis not present

## 2020-05-21 DIAGNOSIS — C61 Malignant neoplasm of prostate: Secondary | ICD-10-CM | POA: Diagnosis not present

## 2020-05-25 ENCOUNTER — Telehealth (HOSPITAL_COMMUNITY): Payer: Self-pay

## 2020-05-25 NOTE — Telephone Encounter (Signed)
Received called from [facility] gastroenterology stating pt needing education on how to manage GTube placed by [place] in June. I spoke with pt who states he is not having any trouble with the feeding tube and does not need any education on it. He states he is trying to establish care with gastroenterology.

## 2020-05-29 DIAGNOSIS — D509 Iron deficiency anemia, unspecified: Secondary | ICD-10-CM | POA: Diagnosis not present

## 2020-05-29 DIAGNOSIS — C7951 Secondary malignant neoplasm of bone: Secondary | ICD-10-CM | POA: Diagnosis not present

## 2020-05-29 DIAGNOSIS — C61 Malignant neoplasm of prostate: Secondary | ICD-10-CM | POA: Diagnosis not present

## 2020-06-05 DIAGNOSIS — C775 Secondary and unspecified malignant neoplasm of intrapelvic lymph nodes: Secondary | ICD-10-CM | POA: Diagnosis not present

## 2020-06-05 DIAGNOSIS — C61 Malignant neoplasm of prostate: Secondary | ICD-10-CM | POA: Diagnosis not present

## 2020-06-05 DIAGNOSIS — C7951 Secondary malignant neoplasm of bone: Secondary | ICD-10-CM | POA: Diagnosis not present

## 2020-06-10 ENCOUNTER — Other Ambulatory Visit: Payer: Self-pay | Admitting: Physician Assistant

## 2020-06-23 ENCOUNTER — Other Ambulatory Visit: Payer: Self-pay | Admitting: Cardiothoracic Surgery

## 2020-06-23 DIAGNOSIS — Z951 Presence of aortocoronary bypass graft: Secondary | ICD-10-CM

## 2020-06-24 ENCOUNTER — Ambulatory Visit (INDEPENDENT_AMBULATORY_CARE_PROVIDER_SITE_OTHER): Payer: Self-pay | Admitting: Cardiothoracic Surgery

## 2020-06-24 ENCOUNTER — Other Ambulatory Visit: Payer: Self-pay

## 2020-06-24 ENCOUNTER — Encounter: Payer: Self-pay | Admitting: Cardiothoracic Surgery

## 2020-06-24 VITALS — BP 154/86 | HR 79 | Temp 97.9°F | Resp 16 | Ht 70.0 in | Wt 196.0 lb

## 2020-06-24 DIAGNOSIS — I255 Ischemic cardiomyopathy: Secondary | ICD-10-CM

## 2020-06-24 DIAGNOSIS — Z09 Encounter for follow-up examination after completed treatment for conditions other than malignant neoplasm: Secondary | ICD-10-CM

## 2020-06-24 DIAGNOSIS — Z951 Presence of aortocoronary bypass graft: Secondary | ICD-10-CM

## 2020-06-24 DIAGNOSIS — Z931 Gastrostomy status: Secondary | ICD-10-CM

## 2020-06-24 DIAGNOSIS — I2510 Atherosclerotic heart disease of native coronary artery without angina pectoris: Secondary | ICD-10-CM

## 2020-06-24 NOTE — Progress Notes (Signed)
PCP is Dorothyann Peng, NP Referring Provider is Burnell Blanks*  Chief Complaint  Patient presents with  . Routine Post Op    s/p CABG X 3...02/25/20...with a CXR and to review progress    HPI: Patient returns for follow-up 3 months after urgent multivessel CABG for ischemic cardiomyopathy.  The patient continues to do extremely well without symptoms of CHF or angina.  Blood pressure is significant improved and he is off midodrine.  His weight is now up to 195 and his swallowing and oral intake is significantly improved.  He is not using his PEG feeding tube.  He will stop tube feeds and flush the tube with water and we will see him back in a month to make sure his weight is maintained.  If the tube is no longer needed he will be referred back to the IR clinic for gastrostomy removal. Chest x-ray performed today shows clear lung fields and resolution of earlier small pleural effusions.  The patient is now ready to resume normal activities including golf yard work and no lifting restrictions.  Past Medical History:  Diagnosis Date  . Arthritis    OA AND PAIN RT KNEE  . Cancer ([place])    h/o neck - ABOUT 6 YRS AGO - TX'D WITH RADIATION AND CHEMO   . Chronic kidney disease   . GERD without esophagitis 03/21/2020  . Head and neck cancer ~ 2009   S/P radiation & [name]  . Headache(784.0)   . Hyperlipidemia   . Hypertension   . Kidney carcinoma ([place])    h/o - NEPHRECTOMY   . 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 Prostate cancer ([place]) 05/20/14   Gleason 4+3=7, volume 25 gm  . Radiation 2015   hx of, prostate cancer  . Seizures ([place])    hx of x yrs, "the bad kind;bite tongue; STATES LAST [place] 2013; WAS SEEING [name] - HE RETIRED AND PT LAST SAW DR. [name]  . Stroke ([place]) [place] 2013   UNABLE TO SPEAK OR MOVE AND RT SIDE WEAKNESS AND LOSS OF SKIN SENSITIVITY TO HEAT AND COLD ON RT SIDE, DOUBLE VISION. BALANCE  PROBLEMS---STATES STILL HAS DOUBLE VISION AND BALANCE PROBLEM AND RT SIDED LOSS OF SKIN SENSITIVITY    Past Surgical History:  Procedure Laterality Date  . CORONARY ARTERY BYPASS GRAFT N/A 03/27/2020   Procedure: CORONARY ARTERY BYPASS GRAFTING (CABG) x Three, using left internal mammary artery and right leg greater saphenous vein harvested endoscopically;  Surgeon: Ivin Poot, MD;  Location: [facility];  Service: Open Heart Surgery;  Laterality: N/A;  . hydrocelectomy  11/2000   left  . IR FLUORO GUIDE CV LINE RIGHT  08/31/2017  . IR GASTROSTOMY TUBE MOD SED  04/07/2020  . IR US GUIDE VASC ACCESS RIGHT  08/31/2017  . JOINT REPLACEMENT     LEFT TOTAL KNEE ARTHROPLASTY  . MOUTH SURGERY  2019  . NEPHRECTOMY  1990's   left  . NEPHRECTOMY    . PROSTATE BIOPSY  05/20/14   Gleason 4+3=7, vol 25 gm  . RIGHT/LEFT HEART CATH AND CORONARY ANGIOGRAPHY N/A 03/24/2020   Procedure: RIGHT/LEFT HEART CATH AND CORONARY ANGIOGRAPHY;  Surgeon: Burnell Blanks, MD;  Location: [facility];  Service: Cardiovascular;  Laterality: N/A;  . TEE WITHOUT CARDIOVERSION  10/04/2011   Procedure: TRANSESOPHAGEAL ECHOCARDIOGRAM (TEE);  Surgeon: Candee Furbish, MD;  Location: [facility];  Service: Cardiovascular;  Laterality: N/A;  . TEE WITHOUT CARDIOVERSION N/A 03/27/2020   Procedure: TRANSESOPHAGEAL ECHOCARDIOGRAM (TEE);  Surgeon: Prescott Gum, Collier Salina, MD;  Location: [facility];  Service: Open Heart Surgery;  Laterality: N/A;  . TOTAL KNEE ARTHROPLASTY  2011   left  . TOTAL KNEE ARTHROPLASTY Right 02/24/2014   Procedure: RIGHT TOTAL KNEE ARTHROPLASTY;  Surgeon: Gearlean Alf, MD;  [facility];  Service: Orthopedics;  Laterality: Right;    Family History  Problem Relation Age of Onset  . Diabetes Mother   . Heart disease Mother   . Heart disease Father     Social History Social History   Tobacco Use  . Smoking status: Never Smoker  . Smokeless tobacco: Never Used  Vaping Use  . Vaping Use: Never used   Substance Use Topics  . Alcohol use: Yes    Alcohol/week: 3.0 standard drinks    Types: 3 Cans of beer per week    Comment: 3 beers a day   . Drug use: No    Current Outpatient Medications  Medication Sig Dispense Refill  . aspirin EC 81 MG tablet Take 81 mg by mouth daily. Swallow whole.    . chlorhexidine gluconate, MEDLINE KIT, (PERIDEX) 0.12 % solution Use as directed 15 mLs in the mouth or throat 2 (two) times daily. 473 mL 0  . Cyanocobalamin (B-12 PO) Take by mouth.    . lamoTRIgine (LAMICTAL) 100 MG tablet TAKE 1 TABLET VIA TUBE EVERY MORNING AND 2 TABLETS EVERY EVENING 90 tablet 0  . Leuprolide Acetate (LUPRON IJ) Inject as directed. Every 90 Days    . Nutritional Supplements (FEEDING SUPPLEMENT, OSMOLITE 1.5 CAL,) LIQD Place 355 mLs into feeding tube 4 (four) times daily.  0  . rosuvastatin (CRESTOR) 20 MG tablet Place 1 tablet (20 mg total) into feeding tube daily. 90 tablet 3   No current facility-administered medications for this visit.    Allergies  Allergen Reactions  . Morphine And Related Nausea And Vomiting    Review of Systems  Weight is increasing because of increased oral intake Numbness in left hand significant improved  BP (!) 154/86 (BP Location: Left Arm, Patient Position: Sitting, Cuff Size: Normal)   Pulse 79   Temp 97.9 F (36.6 C)   Resp 16   Ht _0  (1.778 m)   Wt 196 lb (88.9 kg)   SpO2 96% Comment: RA  BMI 28.12 kg/m  Physical Exam      Exam    General- alert and comfortable.  Surgical incisions clean and dry.    Neck- no JVD, no cervical adenopathy palpable, no carotid bruit   Lungs- clear without rales, wheezes   Cor- regular rate and rhythm, no murmur , gallop   Abdomen- soft, non-tender   Extremities - warm, non-tender, minimal edema   Neuro- oriented, appropriate, no focal weakness   Diagnostic Tests: I reviewed the chest x-ray today which is clear.  Sternal wires intact.  Impression: Excellent recovery 3 months after  urgent multivessel CABG for ischemic cardiomyopathy low EF. Continue current meds per cardiology. If he can maintain adequate oral intake and maintain his weight without using his PEG he will be referred back to IR for PEG removal. Plan: Return in a month to assess nutritional status and discuss removal of PEG tube.   [name], MD [facility] [phone]

## 2020-07-07 ENCOUNTER — Other Ambulatory Visit: Payer: Self-pay

## 2020-07-07 ENCOUNTER — Encounter: Payer: Self-pay | Admitting: Vascular Surgery

## 2020-07-07 ENCOUNTER — Ambulatory Visit (HOSPITAL_COMMUNITY)
Admission: RE | Admit: 2020-07-07 | Discharge: 2020-07-07 | Disposition: A | Discharge status: Home / Self Care | Payer: PPO | Source: Ambulatory Visit | Attending: Vascular Surgery | Admitting: Vascular Surgery

## 2020-07-07 ENCOUNTER — Ambulatory Visit: Payer: PPO | Admitting: Vascular Surgery

## 2020-07-07 DIAGNOSIS — I255 Ischemic cardiomyopathy: Secondary | ICD-10-CM

## 2020-07-07 DIAGNOSIS — I779 Disorder of arteries and arterioles, unspecified: Secondary | ICD-10-CM | POA: Insufficient documentation

## 2020-07-07 DIAGNOSIS — I6523 Occlusion and stenosis of bilateral carotid arteries: Secondary | ICD-10-CM | POA: Diagnosis not present

## 2020-07-07 NOTE — Progress Notes (Signed)
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  Patient initially presented with shortness of breath and was found to have heart failure exacerbation.  He ultimately was evaluated with cardiac cath and found to have significant coronary disease and underwent CABG.  During his scheduled work-up for CABG he underwent duplex that showed reversed flow in the left ICA with suspected fistula to the IJ given pulsatile flow.  He denies any previous knowledge of this.  He denies any history of stroke in the past.  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    He has recovered well from CABG with no ongoing issues including CHF per Dr. Prescott Gum notes.  Past Medical History:  Diagnosis Date  . Arthritis    OA AND PAIN RT KNEE  . Cancer ([place])    h/o neck - ABOUT 6 YRS AGO - TX'D WITH RADIATION AND CHEMO   . Chronic kidney disease   . GERD without esophagitis 03/21/2020  . Head and neck cancer ~ 2009   S/P radiation & [name]  . Headache(784.0)   . Hyperlipidemia   . Hypertension   . Kidney carcinoma ([place])    h/o - NEPHRECTOMY   . 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 Prostate cancer ([place]) 05/20/14   Gleason 4+3=7, volume 25 gm  . Radiation 2015   hx of, prostate cancer  . Seizures ([place])    hx of x yrs, "the bad kind;bite tongue; STATES LAST [place] 2013; WAS SEEING [name] - HE RETIRED AND PT LAST SAW DR. [name]  . Stroke ([place]) [place] 2013   UNABLE TO SPEAK OR  MOVE AND RT SIDE WEAKNESS AND LOSS OF SKIN SENSITIVITY TO HEAT AND COLD ON RT SIDE, DOUBLE VISION. BALANCE PROBLEMS---STATES STILL HAS DOUBLE VISION AND BALANCE PROBLEM AND RT SIDED LOSS OF SKIN SENSITIVITY    Past Surgical History:  Procedure Laterality Date  . CORONARY ARTERY BYPASS GRAFT N/A 03/27/2020   Procedure: CORONARY ARTERY BYPASS GRAFTING (CABG) x Three, using left internal mammary artery and right leg greater saphenous vein harvested endoscopically;  Surgeon: Ivin Poot, MD;  Location: [place];  Service: Open Heart Surgery;  Laterality: N/A;  . hydrocelectomy  11/2000   left  . IR FLUORO GUIDE CV LINE RIGHT  08/31/2017  . IR GASTROSTOMY TUBE MOD SED  04/07/2020  . IR US GUIDE VASC ACCESS RIGHT  08/31/2017  . JOINT REPLACEMENT     LEFT TOTAL KNEE ARTHROPLASTY  . MOUTH SURGERY  2019  . NEPHRECTOMY  1990's   left  . NEPHRECTOMY    . PROSTATE BIOPSY  05/20/14   Gleason 4+3=7, vol 25 gm  . RIGHT/LEFT HEART CATH AND CORONARY ANGIOGRAPHY N/A 03/24/2020   Procedure: RIGHT/LEFT HEART CATH AND CORONARY ANGIOGRAPHY;  Surgeon: Burnell Blanks, MD;  Location: [place] CV LAB;  Service: Cardiovascular;  Laterality: N/A;  . TEE WITHOUT CARDIOVERSION  10/04/2011   Procedure: TRANSESOPHAGEAL ECHOCARDIOGRAM (TEE);  Surgeon: Candee Furbish, MD;  Location: [facility] ENDOSCOPY;  Service: Cardiovascular;  Laterality: N/A;  . TEE WITHOUT CARDIOVERSION N/A 03/27/2020   Procedure: TRANSESOPHAGEAL ECHOCARDIOGRAM (TEE);  Surgeon: Prescott Gum, Collier Salina, MD;  Location: [place];  Service: Open Heart Surgery;  Laterality: N/A;  . TOTAL KNEE ARTHROPLASTY  2011   left  . TOTAL KNEE ARTHROPLASTY Right 02/24/2014   Procedure: RIGHT TOTAL KNEE ARTHROPLASTY;  Surgeon: Gearlean Alf, MD;  [facility];  Service: Orthopedics;  Laterality: Right;    Family History  Problem Relation Age of Onset  . Diabetes Mother   . Heart disease Mother   . Heart disease Father     SOCIAL HISTORY: Social History   Tobacco Use    . Smoking status: Never Smoker  . Smokeless tobacco: Never Used  Substance Use Topics  . Alcohol use: Yes    Alcohol/week: 3.0 standard drinks    Types: 3 Cans of beer per week    Comment: 3 beers a day     Allergies  Allergen Reactions  . Morphine And Related Nausea And Vomiting    Current Outpatient Medications  Medication Sig Dispense Refill  . aspirin EC 81 MG tablet Take 81 mg by mouth daily. Swallow whole.    . chlorhexidine gluconate, MEDLINE KIT, (PERIDEX) 0.12 % solution Use as directed 15 mLs in the mouth or throat 2 (two) times daily. 473 mL 0  . Cyanocobalamin (B-12 PO) Take by mouth.    . lamoTRIgine (LAMICTAL) 100 MG tablet TAKE 1 TABLET VIA TUBE EVERY MORNING AND 2 TABLETS EVERY EVENING 90 tablet 0  . Leuprolide Acetate (LUPRON IJ) Inject as directed. Every 90 Days    . Nutritional Supplements (FEEDING SUPPLEMENT, OSMOLITE 1.5 CAL,) LIQD Place 355 mLs into feeding tube 4 (four) times daily.  0  . rosuvastatin (CRESTOR) 20 MG tablet Place 1 tablet (20 mg total) into feeding tube daily. 90 tablet 3   No current facility-administered medications for this visit.    REVIEW OF SYSTEMS:  $RemoveB'[X]'AMTdJhgV$  denotes positive finding, $RemoveBeforeDEI'[ ]'DunBAXrUIyvunwBn$  denotes negative finding Cardiac  Comments:  Chest pain or chest pressure:    Shortness of breath upon exertion:    Short of breath when lying flat:    Irregular heart rhythm:        Vascular    Pain in calf, thigh, or hip brought on by ambulation:    Pain in feet at night that wakes you up from your sleep:     Blood clot in your veins:    Leg swelling:         Pulmonary    Oxygen at home:    Productive cough:     Wheezing:         Neurologic    Sudden weakness in arms or legs:     Sudden numbness in arms or legs:     Sudden onset of difficulty speaking or slurred speech:    Temporary loss of vision in one eye:     Problems with dizziness:         Gastrointestinal    Blood in stool:     Vomited blood:         Genitourinary     Burning when urinating:     Blood in urine:        Psychiatric    Major depression:         Hematologic    Bleeding problems:    Problems with blood clotting too easily:  Skin    Rashes or ulcers:        Constitutional    Fever or chills:      PHYSICAL EXAM: Vitals:   07/07/20 1512 07/07/20 1516  BP: (!) 165/91 (!) 170/88  Pulse: 78 78  Resp: 18   Temp: 97.7 F (36.5 C)   TempSrc: Temporal   SpO2: 100%   Weight: 198 lb (89.8 kg)   Height: $Remove'5\' 11"'EATaFNF$  (1.803 m)     GENERAL: The patient is a well-nourished male, in no acute distress. The vital signs are documented above. CARDIAC: There is a regular rate and rhythm.  PULMONARY: There is good air exchange bilaterally without wheezing or rales. ABDOMEN: Soft and non-tender with normal pitched bowel sounds.  MUSCULOSKELETAL: There are no major deformities or cyanosis. NEUROLOGIC: No focal weakness or paresthesias are detected.  Cranial nerves II through XII grossly intact SKIN: There are no ulcers or rashes noted. PSYCHIATRIC: The patient has a normal affect.  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  Repeat duplex today unable to demonstrate persistence of the fistula and no significant arterial loss flow in the left IJ as would be expected.  In addition I previously discussed with him I think this is an incidental finding and would not favor treating it unless he had some ongoing issue like high-output heart failure etc at which time would consider carotid arteriogram and covered stent.  In reviewing the notes from Dr. Jacklynn Ganong he has recovered from his heart surgery  without issue and having no significant volume or ongoing CHF issues.  Anticipate he is going to do well.  Discussed he can follow-up with [place] in 3 years with carotid duplex for surveillance of very minimal carotid disease at this point.   Marty Heck, MD [facility] Office: [phone]

## 2020-07-09 ENCOUNTER — Other Ambulatory Visit: Payer: Self-pay

## 2020-07-09 ENCOUNTER — Ambulatory Visit (HOSPITAL_COMMUNITY): Payer: PPO | Attending: Cardiology

## 2020-07-09 DIAGNOSIS — I255 Ischemic cardiomyopathy: Secondary | ICD-10-CM

## 2020-07-09 LAB — ECHOCARDIOGRAM LIMITED
Area-P 1/2: 6.6 cm2
P 1/2 time: 457 msec
S' Lateral: 3.6 cm

## 2020-07-10 ENCOUNTER — Other Ambulatory Visit: Payer: Self-pay | Admitting: Cardiothoracic Surgery

## 2020-07-14 ENCOUNTER — Other Ambulatory Visit: Payer: Self-pay | Admitting: Cardiothoracic Surgery

## 2020-07-15 ENCOUNTER — Other Ambulatory Visit (HOSPITAL_COMMUNITY): Payer: Self-pay | Admitting: Cardiothoracic Surgery

## 2020-07-15 ENCOUNTER — Telehealth (HOSPITAL_COMMUNITY): Payer: Self-pay

## 2020-07-15 DIAGNOSIS — Z431 Encounter for attention to gastrostomy: Secondary | ICD-10-CM

## 2020-07-15 NOTE — Telephone Encounter (Signed)
Called to schedule peg removal, no answer, left vm. AW  °

## 2020-07-20 ENCOUNTER — Encounter: Payer: Self-pay | Admitting: Internal Medicine

## 2020-07-20 ENCOUNTER — Ambulatory Visit (INDEPENDENT_AMBULATORY_CARE_PROVIDER_SITE_OTHER): Payer: PPO | Admitting: Internal Medicine

## 2020-07-20 ENCOUNTER — Other Ambulatory Visit: Payer: Self-pay

## 2020-07-20 ENCOUNTER — Ambulatory Visit (HOSPITAL_COMMUNITY)
Admission: RE | Admit: 2020-07-20 | Discharge: 2020-07-20 | Disposition: A | Discharge status: Home / Self Care | Payer: PPO | Source: Ambulatory Visit | Attending: Cardiothoracic Surgery | Admitting: Cardiothoracic Surgery

## 2020-07-20 VITALS — BP 160/92 | HR 83 | Ht 71.0 in | Wt 201.0 lb

## 2020-07-20 DIAGNOSIS — I255 Ischemic cardiomyopathy: Secondary | ICD-10-CM | POA: Diagnosis not present

## 2020-07-20 DIAGNOSIS — Z431 Encounter for attention to gastrostomy: Secondary | ICD-10-CM | POA: Diagnosis not present

## 2020-07-20 DIAGNOSIS — E785 Hyperlipidemia, unspecified: Secondary | ICD-10-CM | POA: Diagnosis not present

## 2020-07-20 DIAGNOSIS — R1310 Dysphagia, unspecified: Secondary | ICD-10-CM | POA: Insufficient documentation

## 2020-07-20 DIAGNOSIS — Z951 Presence of aortocoronary bypass graft: Secondary | ICD-10-CM | POA: Diagnosis not present

## 2020-07-20 DIAGNOSIS — I10 Essential (primary) hypertension: Secondary | ICD-10-CM

## 2020-07-20 DIAGNOSIS — I25810 Atherosclerosis of coronary artery bypass graft(s) without angina pectoris: Secondary | ICD-10-CM | POA: Diagnosis not present

## 2020-07-20 HISTORY — PX: IR GASTROSTOMY TUBE REMOVAL: IMG5492

## 2020-07-20 MED ORDER — CARVEDILOL 3.125 MG PO TABS: 3.1250 mg | ORAL_TABLET | Freq: Two times a day (BID) | ORAL | 3 refills | Status: DC

## 2020-07-20 MED ORDER — LIDOCAINE VISCOUS HCL 2 % MT SOLN
OROMUCOSAL | Status: AC
  Filled 2020-07-20: qty 15

## 2020-07-20 MED ORDER — LIDOCAINE VISCOUS HCL 2 % MT SOLN
OROMUCOSAL | Status: DC | PRN
  Administered 2020-07-20: 15 mL via OROMUCOSAL

## 2020-07-20 NOTE — Progress Notes (Signed)
OFFICE NOTE  Chief Complaint:  Hospital follow-up  Primary Care Physician: Connor Peng, NP  HPI:  Connor [name] is a 74 y.o. male who is being seen today for the evaluation of coronary artery disease at the request of Connor Huddle, MD.  Mr. Want returns today for hospital follow-up.  He is actually been seen already by Connor Deforest, PA-C for follow-up of CABG which she underwent in June 2021.  His past medical history significant for squamous cell carcinoma with tonsillar resection, chemotherapy and radiation, multiple strokes, hypertension, dyslipidemia and history of seizures.  He had orthostatic hypotension and heart failure with new LV dysfunction and EF 25 to 30%.  He underwent cardiac catheterization which showed multivessel coronary disease and had CABG x3 with LIMA to LAD, SVG to OM and SVG to PDA on 03/27/2020.  Although he is recovered well he had persistent orthostasis and heart failure symptoms.  This required midodrine for a period of time and was not on goal-directed medical therapy.  Subsequently his LV function has now normalized with an EF of percent by echo on 07/09/2020.  Overall he feels well.  He still has some issues with dizziness.  He is not been able to go back to playing golf.  Blood pressures although have rebounded as well to his preprocedure hypertension.  He is 160/92 today and is seeing blood pressures as high as 170.  PMHx:  Past Medical History:  Diagnosis Date  . Arthritis    OA AND PAIN RT KNEE  . Cancer ([place])    h/o neck - ABOUT 6 YRS AGO - TX'D WITH RADIATION AND CHEMO   . Chronic kidney disease   . GERD without esophagitis 03/21/2020  . Head and neck cancer ~ 2009   S/P radiation & [name]  . Headache(784.0)   . Hyperlipidemia   . Hypertension   . Kidney carcinoma ([place])    h/o - NEPHRECTOMY   . 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 Prostate cancer ([place]) 05/20/14   Gleason 4+3=7, volume 25 gm  .  Radiation 2015   hx of, prostate cancer  . Seizures ([place])    hx of x yrs, "the bad kind;bite tongue; STATES LAST [place] 2013; WAS SEEING [name] - HE RETIRED AND PT LAST SAW DR. [name]  . Stroke ([place]) [place] 2013   UNABLE TO SPEAK OR MOVE AND RT SIDE WEAKNESS AND LOSS OF SKIN SENSITIVITY TO HEAT AND COLD ON RT SIDE, DOUBLE VISION. BALANCE PROBLEMS---STATES STILL HAS DOUBLE VISION AND BALANCE PROBLEM AND RT SIDED LOSS OF SKIN SENSITIVITY    Past Surgical History:  Procedure Laterality Date  . CORONARY ARTERY BYPASS GRAFT N/A 03/27/2020   Procedure: CORONARY ARTERY BYPASS GRAFTING (CABG) x Three, using left internal mammary artery and right leg greater saphenous vein harvested endoscopically;  Surgeon: Ivin Poot, MD;  Location: [place];  Service: Open Heart Surgery;  Laterality: N/A;  . hydrocelectomy  11/2000   left  . IR FLUORO GUIDE CV LINE RIGHT  08/31/2017  . IR GASTROSTOMY TUBE MOD SED  04/07/2020  . IR US GUIDE VASC ACCESS RIGHT  08/31/2017  . JOINT REPLACEMENT     LEFT TOTAL KNEE ARTHROPLASTY  . MOUTH SURGERY  2019  . NEPHRECTOMY  1990's   left  . NEPHRECTOMY    . PROSTATE BIOPSY  05/20/14   Gleason 4+3=7, vol 25 gm  . RIGHT/LEFT HEART  CATH AND CORONARY ANGIOGRAPHY N/A 03/24/2020   Procedure: RIGHT/LEFT HEART CATH AND CORONARY ANGIOGRAPHY;  Surgeon: Burnell Blanks, MD;  Location: [place] CV LAB;  Service: Cardiovascular;  Laterality: N/A;  . TEE WITHOUT CARDIOVERSION  10/04/2011   Procedure: TRANSESOPHAGEAL ECHOCARDIOGRAM (TEE);  Surgeon: Candee Furbish, MD;  Location: [facility] ENDOSCOPY;  Service: Cardiovascular;  Laterality: N/A;  . TEE WITHOUT CARDIOVERSION N/A 03/27/2020   Procedure: TRANSESOPHAGEAL ECHOCARDIOGRAM (TEE);  Surgeon: Prescott Gum, Collier Salina, MD;  Location: [place];  Service: Open Heart Surgery;  Laterality: N/A;  . TOTAL KNEE ARTHROPLASTY  2011   left  . TOTAL KNEE ARTHROPLASTY Right 02/24/2014   Procedure: RIGHT TOTAL KNEE ARTHROPLASTY;   Surgeon: Gearlean Alf, MD;  [facility];  Service: Orthopedics;  Laterality: Right;    FAMHx:  Family History  Problem Relation Age of Onset  . Diabetes Mother   . Heart disease Mother   . Heart disease Father     SOCHx:   reports that he has never smoked. He has never used smokeless tobacco. He reports current alcohol use of about 3.0 standard drinks of alcohol per week. He reports that he does not use drugs.  ALLERGIES:  Allergies  Allergen Reactions  . Morphine And Related Nausea And Vomiting    ROS: Pertinent items noted in HPI and remainder of comprehensive ROS otherwise negative.  HOME MEDS: Current Outpatient Medications on File Prior to Visit  Medication Sig Dispense Refill  . aspirin EC 81 MG tablet Take 81 mg by mouth daily. Swallow whole.    . chlorhexidine gluconate, MEDLINE KIT, (PERIDEX) 0.12 % solution Use as directed 15 mLs in the mouth or throat 2 (two) times daily. 473 mL 0  . Cyanocobalamin (B-12 PO) Take by mouth.    . lamoTRIgine (LAMICTAL) 100 MG tablet TAKE 1 TABLET VIA TUBE EVERY MORNING AND 2 TABLETS EVERY EVENING 90 tablet 0  . Leuprolide Acetate (LUPRON IJ) Inject as directed. Every 90 Days    . Nutritional Supplements (FEEDING SUPPLEMENT, OSMOLITE 1.5 CAL,) LIQD Place 355 mLs into feeding tube 4 (four) times daily.  0  . rosuvastatin (CRESTOR) 20 MG tablet Place 1 tablet (20 mg total) into feeding tube daily. 90 tablet 3   No current facility-administered medications on file prior to visit.    LABS/IMAGING: No results found for this or any previous visit (from the past 48 hour(s)). No results found.  LIPID PANEL:    Component Value Date/Time   CHOL 149 12/05/2011 0730   TRIG 123 12/05/2011 0730   HDL 56 12/05/2011 0730   CHOLHDL 2.7 12/05/2011 0730   VLDL 25 12/05/2011 0730   LDLCALC 68 12/05/2011 0730    WEIGHTS: Wt Readings from Last 3 Encounters:  07/20/20 201 lb (91.2 kg)  07/07/20 198 lb (89.8 kg)  06/24/20 196 lb (88.9  kg)    VITALS: BP (!) 160/92   Pulse 83   Ht $R'5\' 11"'Tq$  (1.803 m)   Wt 201 lb (91.2 kg)   BMI 28.03 kg/m   EXAM: General appearance: alert and no distress Neck: no carotid bruit, no JVD and thyroid not enlarged, symmetric, no tenderness/mass/nodules Lungs: clear to auscultation bilaterally Heart: regular rate and rhythm Abdomen: soft, non-tender; bowel sounds normal; no masses,  no organomegaly Extremities: extremities normal, atraumatic, no cyanosis or edema Pulses: 2+ and symmetric Skin: Skin color, texture, turgor normal. No rashes or lesions Neurologic: Grossly normal Psych: Pleasant  EKG: Deferred  ASSESSMENT: 1. Coronary artery disease status post CABG  x3 (LIMA to LAD, SVG to OM, SVG to PDA) -03/2020 2. Ischemic cardiomyopathy EF 25 to 30%, improved to 55 to 60% (07/09/2020)-NYHA class I-II symptoms 3. Uncontrolled hypertension 4. History of cancer 5. History of stroke 6. Dyslipidemia  PLAN: 1.   Mr. Haapala has had improvement in his LVEF after revascularization.  He has subsequently come off of midodrine which she was requiring for orthostatic hypotension.  He is now hypertensive and will need to be on goal-directed medical therapy.  Plan start carvedilol 3.125 mg twice daily.  We may uptitrate this as necessary.  He has a follow-up with Dr. Darcey Nora next week.  We will plan follow-up with me in 6 months or sooner as necessary.  Pixie Casino, MD, [facility], [place] Director of the Advanced Lipid Disorders &  Cardiovascular Risk Reduction Clinic Diplomate of the American Board of Clinical Lipidology Attending Cardiologist  Direct Dial: [phone]  Fax: [phone]  Website:  [email or web address] Funches 07/20/2020, 1:50 PM

## 2020-07-20 NOTE — Patient Instructions (Signed)
Medication Instructions:  START carvedilol 3.125mg  twice daily  *If you need a refill on your cardiac medications before your next appointment, please call your pharmacy*   Follow-Up: At [facility], you and your health needs are our priority.  As part of our continuing mission to provide you with exceptional heart care, we have created designated Provider Care Teams.  These Care Teams include your primary Cardiologist (physician) and Advanced Practice Providers (APPs -  Physician Assistants and Nurse Practitioners) who all work together to provide you with the care you need, when you need it.  We recommend signing up for the patient portal called "MyChart".  Sign up information is provided on this After Visit Summary.  MyChart is used to connect with patients for Virtual Visits (Telemedicine).  Patients are able to view lab/test results, encounter notes, upcoming appointments, etc.  Non-urgent messages can be sent to your provider as well.   To learn more about what you can do with MyChart, go to [email or web address].    Your next appointment:   6 month(s)  The format for your next appointment:   In Person  Provider:   You may see Pixie Casino, MD or one of the following Advanced Practice Providers on your designated Care Team:    [name], PA-C  Fabian Sharp, PA-C or   Roby Lofts, [place]    Other Instructions

## 2020-07-20 NOTE — Progress Notes (Signed)
Patient s/p gastrostomy tube placement 04/07/2020 by Dr. Vernard Gambles for dysphagia after CABG.  He has been able to advance his p.o. diet and is eating and drinking well.  He no longer uses the tube.  Request is made for removal. Gastrostomy tube removed in its entirety without complication.  Patient tolerated well.   EBL 0 mL. Dressing applied. Care instructions given.  Brynda Greathouse, MS RD PA-C

## 2020-07-29 ENCOUNTER — Ambulatory Visit: Payer: PPO | Admitting: Cardiothoracic Surgery

## 2020-07-29 ENCOUNTER — Other Ambulatory Visit: Payer: Self-pay

## 2020-07-29 ENCOUNTER — Encounter: Payer: Self-pay | Admitting: Cardiothoracic Surgery

## 2020-07-29 VITALS — BP 179/95 | HR 71 | Resp 20 | Ht 71.0 in | Wt 196.0 lb

## 2020-07-29 DIAGNOSIS — Z4889 Encounter for other specified surgical aftercare: Secondary | ICD-10-CM | POA: Diagnosis not present

## 2020-07-29 DIAGNOSIS — Z951 Presence of aortocoronary bypass graft: Secondary | ICD-10-CM | POA: Diagnosis not present

## 2020-07-29 NOTE — Progress Notes (Signed)
PCP is Dorothyann Peng, NP Referring Provider is Burnell Blanks*  Chief Complaint  Patient presents with  . Routine Post Op    s/p CABG  . Coronary Artery Disease    HPI: Final postop check 4 months after multivessel CABG for ischemic cardiomyopathy. Patient continues to do well without recurrent angina or symptoms of CHF. His feeding tube that was placed postop for dysphagia has been removed by IR as his swallowing function has returned to baseline and he is actually gaining weight.  He continues to try to follow a heart healthy diet and daily 40 minutes of ambulating or aerobic activity.  Past Medical History:  Diagnosis Date  . Arthritis    OA AND PAIN RT KNEE  . Cancer ([place])    h/o neck - ABOUT 6 YRS AGO - TX'D WITH RADIATION AND CHEMO   . Chronic kidney disease   . GERD without esophagitis 03/21/2020  . Head and neck cancer ~ 2009   S/P radiation & [name]  . Headache(784.0)   . Hyperlipidemia   . Hypertension   . Kidney carcinoma ([place])    h/o - NEPHRECTOMY   . 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 Prostate cancer ([place]) 05/20/14   Gleason 4+3=7, volume 25 gm  . Radiation 2015   hx of, prostate cancer  . Seizures ([place])    hx of x yrs, "the bad kind;bite tongue; STATES LAST [place] 2013; WAS SEEING [name] - HE RETIRED AND PT LAST SAW DR. [name]  . Stroke ([place]) [place] 2013   UNABLE TO SPEAK OR MOVE AND RT SIDE WEAKNESS AND LOSS OF SKIN SENSITIVITY TO HEAT AND COLD ON RT SIDE, DOUBLE VISION. BALANCE PROBLEMS---STATES STILL HAS DOUBLE VISION AND BALANCE PROBLEM AND RT SIDED LOSS OF SKIN SENSITIVITY    Past Surgical History:  Procedure Laterality Date  . CORONARY ARTERY BYPASS GRAFT N/A 03/27/2020   Procedure: CORONARY ARTERY BYPASS GRAFTING (CABG) x Three, using left internal mammary artery and right leg greater saphenous vein harvested endoscopically;  Surgeon: Ivin Poot, MD;  Location: [place];   Service: Open Heart Surgery;  Laterality: N/A;  . hydrocelectomy  11/2000   left  . IR FLUORO GUIDE CV LINE RIGHT  08/31/2017  . IR GASTROSTOMY TUBE MOD SED  04/07/2020  . IR GASTROSTOMY TUBE REMOVAL  07/20/2020  . IR US GUIDE VASC ACCESS RIGHT  08/31/2017  . JOINT REPLACEMENT     LEFT TOTAL KNEE ARTHROPLASTY  . MOUTH SURGERY  2019  . NEPHRECTOMY  1990's   left  . NEPHRECTOMY    . PROSTATE BIOPSY  05/20/14   Gleason 4+3=7, vol 25 gm  . RIGHT/LEFT HEART CATH AND CORONARY ANGIOGRAPHY N/A 03/24/2020   Procedure: RIGHT/LEFT HEART CATH AND CORONARY ANGIOGRAPHY;  Surgeon: Burnell Blanks, MD;  Location: [place] CV LAB;  Service: Cardiovascular;  Laterality: N/A;  . TEE WITHOUT CARDIOVERSION  10/04/2011   Procedure: TRANSESOPHAGEAL ECHOCARDIOGRAM (TEE);  Surgeon: Candee Furbish, MD;  Location: [facility] ENDOSCOPY;  Service: Cardiovascular;  Laterality: N/A;  . TEE WITHOUT CARDIOVERSION N/A 03/27/2020   Procedure: TRANSESOPHAGEAL ECHOCARDIOGRAM (TEE);  Surgeon: Prescott Gum, Collier Salina, MD;  Location: [place];  Service: Open Heart Surgery;  Laterality: N/A;  . TOTAL KNEE ARTHROPLASTY  2011   left  . TOTAL KNEE ARTHROPLASTY Right 02/24/2014   Procedure: RIGHT TOTAL KNEE ARTHROPLASTY;  Surgeon: Gearlean Alf, MD;  [facility];  Service: Orthopedics;  Laterality: Right;    Family History  Problem Relation Age of Onset  . Diabetes Mother   . Heart disease Mother   . Heart disease Father     Social History Social History   Tobacco Use  . Smoking status: Never Smoker  . Smokeless tobacco: Never Used  Vaping Use  . Vaping Use: Never used  Substance Use Topics  . Alcohol use: Yes    Alcohol/week: 3.0 standard drinks    Types: 3 Cans of beer per week    Comment: 3 beers a day   . Drug use: No    Current Outpatient Medications  Medication Sig Dispense Refill  . aspirin EC 81 MG tablet Take 81 mg by mouth daily. Swallow whole.    . carvedilol (COREG) 3.125 MG tablet Take 1 tablet (3.125 mg  total) by mouth 2 (two) times daily. 180 tablet 3  . chlorhexidine gluconate, MEDLINE KIT, (PERIDEX) 0.12 % solution Use as directed 15 mLs in the mouth or throat 2 (two) times daily. 473 mL 0  . Cyanocobalamin (B-12 PO) Take by mouth.    . lamoTRIgine (LAMICTAL) 100 MG tablet TAKE 1 TABLET VIA TUBE EVERY MORNING AND 2 TABLETS EVERY EVENING 90 tablet 0  . Leuprolide Acetate (LUPRON IJ) Inject as directed. Every 90 Days    . rosuvastatin (CRESTOR) 20 MG tablet Place 1 tablet (20 mg total) into feeding tube daily. 90 tablet 3   No current facility-administered medications for this visit.    Allergies  Allergen Reactions  . Morphine And Related Nausea And Vomiting    Review of Systems  Weight increasing No fever No symptoms of COVID-19 No significant ankle edema Surgical incision is healed With his weight gain he has developed hypertension and has been started on Coreg by his cardiologist  BP (!) 179/95 (BP Location: Right Arm, Patient Position: Sitting, Cuff Size: Large)   Pulse 71   Resp 20   Ht _0  (1.803 m)   Wt 196 lb (88.9 kg)   SpO2 97% Comment: RA  BMI 27.34 kg/m  Physical Exam      Exam    General- alert and comfortable    Neck- no JVD, no cervical adenopathy palpable, no carotid bruit   Lungs- clear without rales, wheezes   Cor- regular rate and rhythm, no murmur , gallop   Abdomen- soft, non-tender   Extremities - warm, non-tender, minimal edema   Neuro- oriented, appropriate, no focal weakness   Diagnostic Tests: None  Impression: Patient has fully recovered after multivessel CABG Sternal precautions are lifted The important principles of a heart healthy lifestyle including heart healthy activity and diet reviewed with patient.  Plan: Return as needed   [name], MD [facility] [phone]

## 2020-07-31 ENCOUNTER — Other Ambulatory Visit: Payer: Self-pay | Admitting: Adult Health

## 2020-07-31 NOTE — Telephone Encounter (Signed)
Please advise on refill.

## 2020-08-10 ENCOUNTER — Encounter: Payer: Self-pay | Admitting: Pharmacist

## 2020-08-12 ENCOUNTER — Other Ambulatory Visit: Payer: Self-pay | Admitting: Diagnostic Neuroimaging

## 2020-08-18 ENCOUNTER — Other Ambulatory Visit: Payer: Self-pay | Admitting: Hematology and Oncology

## 2020-08-18 DIAGNOSIS — C61 Malignant neoplasm of prostate: Secondary | ICD-10-CM | POA: Diagnosis not present

## 2020-08-18 LAB — CBC AND DIFFERENTIAL
HCT: 40 — AB (ref 41–53)
Hemoglobin: 13.5 (ref 13.5–17.5)
Neutrophils Absolute: 3.08
Platelets: 203 (ref 150–399)
WBC: 5.5

## 2020-08-18 LAB — BASIC METABOLIC PANEL
BUN: 17 (ref 4–21)
CO2: 28 — AB (ref 13–22)
Chloride: 95 — AB (ref 99–108)
Creatinine: 0.9 (ref 0.6–1.3)
Glucose: 135
Potassium: 4.2 (ref 3.4–5.3)
Sodium: 132 — AB (ref 137–147)

## 2020-08-18 LAB — COMPREHENSIVE METABOLIC PANEL
Albumin: 4.3 (ref 3.5–5.0)
Calcium: 9.7 (ref 8.7–10.7)

## 2020-08-18 LAB — HEPATIC FUNCTION PANEL
ALT: 20 (ref 10–40)
AST: 30 (ref 14–40)
Alkaline Phosphatase: 107 (ref 25–125)
Bilirubin, Total: 0.6

## 2020-08-18 LAB — CBC: RBC: 4.54 (ref 3.87–5.11)

## 2020-08-20 DIAGNOSIS — M272 Inflammatory conditions of jaws: Secondary | ICD-10-CM | POA: Diagnosis not present

## 2020-08-20 DIAGNOSIS — Y842 Radiological procedure and radiotherapy as the cause of abnormal reaction of the patient, or of later complication, without mention of misadventure at the time of the procedure: Secondary | ICD-10-CM | POA: Diagnosis not present

## 2020-08-21 ENCOUNTER — Inpatient Hospital Stay: Payer: PPO

## 2020-08-21 DIAGNOSIS — C61 Malignant neoplasm of prostate: Secondary | ICD-10-CM | POA: Diagnosis not present

## 2020-08-24 ENCOUNTER — Encounter: Payer: Self-pay | Admitting: Diagnostic Neuroimaging

## 2020-08-24 ENCOUNTER — Inpatient Hospital Stay: Payer: PPO

## 2020-08-24 ENCOUNTER — Other Ambulatory Visit: Payer: Self-pay

## 2020-08-24 ENCOUNTER — Ambulatory Visit: Payer: PPO | Admitting: Diagnostic Neuroimaging

## 2020-08-24 VITALS — BP 171/76 | HR 69 | Ht 71.0 in | Wt 202.0 lb

## 2020-08-24 DIAGNOSIS — I63113 Cerebral infarction due to embolism of bilateral vertebral arteries: Secondary | ICD-10-CM | POA: Diagnosis not present

## 2020-08-24 DIAGNOSIS — G40909 Epilepsy, unspecified, not intractable, without status epilepticus: Secondary | ICD-10-CM

## 2020-08-24 DIAGNOSIS — G252 Other specified forms of tremor: Secondary | ICD-10-CM | POA: Diagnosis not present

## 2020-08-24 MED ORDER — LAMOTRIGINE 100 MG PO TABS
ORAL_TABLET | ORAL | 4 refills | Status: DC
Start: 2020-08-24 — End: 2021-10-26

## 2020-08-24 NOTE — Progress Notes (Signed)
[facility]  PATIENT: Connor [name] DOB: [birth year]  REFERRING CLINICIAN:  HISTORY FROM: patient  REASON FOR VISIT: follow up (former Dr. Erling [name] pt)   HISTORICAL  CHIEF COMPLAINT:  Chief Complaint  Patient presents with  . Seizures    rm 7, one year FU  "no seizures"    HISTORY OF PRESENT ILLNESS:   UPDATE (08/24/20, VRP): Since last visit, doing well. Symptoms are stable. No alleviating or aggravating factors. Tolerating meds. No seizures. 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   UPDATE (03/05/18, VRP): Since last visit, doing well. Tolerating meds. No alleviating or aggravating factors. No seizures.  UPDATE 02/01/17: Since last visit, PSA has gone up and may need more treatment. No seizures. Has noticed fine tremor in bilateral hands x 6-8 months. Paternal grandfather and uncle had parkinsons.   UPDATE 11/17/15: Since last visit, doing well. No seizures. Some mild balance issues across open parking lots, but no major falls or problems. Completed prostate radiation.   UPDATE 10/13/14: Since last visit, now on prostate CA therapy (radiation and hormone). No seizures. No new stroke like events. Overall stable neurologically.   UPDATE 10/11/13: Since last visit, stable. No seizures since Dec 2012. Still with some balance difficulty. Tolerating LTG.  PRIOR HPI (12/17/11, Dr. Erling [name]): 74 year old right-handed white divorced male with a history of episodes in which his mind would suddenly blankout beginning in 1984.  It would be an unusual sensation and during the episodes he could not respond. He had an episode while on a plane trip to [place] 10/1992 with a weird sensation and then a generalized major motor seizure.  The plane landed in [place]  and he was evaluated at the [facility]. CAT scan without dye, EKG, CBC, SMA 6, SMA 12, magnesium level were normal and  he was placed on Dilantin 300 mg per day. A second generalized  major motor seizure occurred 04/1993 and  I first saw him 05/31/1993. He was drinking 42 beers per week. Because the seizures were simple partial and complex partial with secondary generalization. It was felt his seizures might not  be related to alcohol. 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 He was placed on Dilantin medication and subsequently switched to Lamictal in 2006. He  tried to taper lamotrigine several times and  had generalized major motor seizures. This occurred in 2009. He had a CAT scan for evaluation and a bone density scan 12/06/2004. The foreign body was removed from the right orbit . He previously had an MRI of his lumbar spine 12/13/2006 showing severe canal stenosis at L4-5 anterior listhesis of 4-5 mm and severe facet hypertrophy and a large synovial cyst emanating from the left facet joint. There were also degenerative changes on the right at L5-S1 and facet hypertrophy at L5-S1. There was  disc bulging at L3-4.  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 He underwent radiation therapy and chemotherapy at [facility]. CT of the neck with contrast 07/12/11 showed no evidence of recurrent  disease. 7/12 he had a nocturnal generalized seizure. He awoke at night having bitten his tongue.  He had been trying to taper lamotrigine. 12/13/10 while [place] the [place] and drinking beer  regularly during the  the day he had a seizure. 08/29/11 he had  another seizure.  In November, he  had persistent left-sided headache occurring on a daily basis. Wednesday 09/28/11, he was eating breakfast with friends in a restaurant and noted the onset of the left sided headache with numbness in the left forehead extending down the left side of his face and then involving his entire body with  a  tingling sensation or numbness,worse on the left side of his body than the right. He became weak in both arms and both legs and slumped in his chair.  911 was called and he was seen at [facility] emergency room. CT scan of the brain without contrast was unremarkable and MRI study of the brain without and with contrast which was reviewed by Dr. Patton Salles was negative for acute stroke. At home he noted falling to his left, double vision, and oscillopsia. He did  have hiccups. 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  CT angiogram showed no evidence of posterior circulation stenosis or occlusion and TEE showed normal EF and no emboli source. In the hospital he had a seizure and Lamictal was increas to 100 mg AM and 200 mg PM.He was improving  but 2/9 after having one beer he noticed dizziness and blurred vision  with vertigo and decreased balance. He was seen in the [facility] ER 12/04/11 MRI showing no evidence of acute stroke. He has slowly improved. He denies palpitations. He has spinning, double vision, hiccups,numbness left face and right body, and balance problems. He denies auras of seizures.  REVIEW OF SYSTEMS: Full 14 system review of systems performed and notable only for double vision eye pain trouble swallowing gait diff memory loss.   ALLERGIES: Allergies  Allergen Reactions  . Morphine And Related Nausea And Vomiting    HOME MEDICATIONS: Outpatient Medications Prior to Visit  Medication Sig Dispense Refill  . AMLODIPINE BESYLATE-VALSARTAN PO Take 1 tablet by mouth daily. 5mg  orally    . aspirin EC 81 MG tablet Take 81 mg by mouth daily. Swallow whole.    . Bicalutamide (CASODEX PO) Take by mouth as directed.    . calcium-vitamin D (OSCAL WITH D) 500-200 MG-UNIT tablet Take 1 tablet by mouth 2 (two) times daily. 1500mg  / 200unit tablet    . carvedilol (COREG) 3.125 MG tablet  Take 1 tablet (3.125 mg total) by mouth 2 (two) times daily. 180 tablet 3  . chlorhexidine (PERIDEX) 0.12 % solution RINSE 15 ML BY MOUTH OR THROAT TWICE DAILY 473 mL 0  . Cyanocobalamin (B-12 PO) Take by mouth.    . lamoTRIgine (LAMICTAL) 100 MG tablet TAKE 1 TABLET BY MOUTH EVERY MORNING AND 2 TABLETS EVERY EVENING 90 tablet 0  . Leuprolide Acetate (LUPRON IJ) Inject as directed. Every 90 Days    . rosuvastatin (CRESTOR) 20 MG tablet Place 1 tablet (20 mg total) into feeding tube daily. 90 tablet 3   No facility-administered medications prior to visit.    PAST MEDICAL HISTORY: Past Medical History:  Diagnosis Date  . Arthritis    OA AND PAIN RT KNEE  . Cancer ([place])    h/o neck - ABOUT 6 YRS AGO - TX'D WITH RADIATION AND CHEMO   . Chronic kidney disease   . GERD without esophagitis 03/21/2020  . Head and neck cancer ~ 2009   S/P radiation & chem, [place]  [facility]  . Headache(784.0)   . Hyperlipidemia   . Hypertension   . Kidney carcinoma ([place])    h/o - NEPHRECTOMY   . Malignant neoplasm of prostate ([place])   . 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 Prostate cancer ([place]) 05/20/14   Gleason 4+3=7, volume 25 gm  . Radiation 2015   hx of, prostate cancer  . Seizures ([place])    hx of x yrs, "the bad kind;bite tongue; STATES LAST [place] 2013; WAS SEEING DR. Erling [name] - HE RETIRED AND PT LAST SAW DR. [name]  . Stroke ([place]) [place] 2013   UNABLE TO SPEAK OR MOVE AND RT SIDE WEAKNESS AND LOSS OF SKIN SENSITIVITY TO HEAT AND COLD ON RT SIDE, DOUBLE VISION. BALANCE PROBLEMS---STATES STILL HAS DOUBLE VISION AND BALANCE PROBLEM AND RT SIDED LOSS OF SKIN SENSITIVITY    PAST SURGICAL HISTORY: Past Surgical History:  Procedure Laterality Date  . CORONARY ARTERY BYPASS GRAFT N/A 03/27/2020   Procedure: CORONARY ARTERY BYPASS GRAFTING (CABG) x Three, using left internal mammary artery and right leg greater saphenous vein harvested endoscopically;  Surgeon:  Ivin Poot, MD;  Location: [place];  Service: Open Heart Surgery;  Laterality: N/A;  . hydrocelectomy  11/2000   left  . IR FLUORO GUIDE CV LINE RIGHT  08/31/2017  . IR GASTROSTOMY TUBE MOD SED  04/07/2020  . IR GASTROSTOMY TUBE REMOVAL  07/20/2020  . IR US GUIDE VASC ACCESS RIGHT  08/31/2017  . JOINT REPLACEMENT     LEFT TOTAL KNEE ARTHROPLASTY  . MOUTH SURGERY  2019  . NEPHRECTOMY  1990's   left  . NEPHRECTOMY    . PROSTATE BIOPSY  05/20/14   Gleason 4+3=7, vol 25 gm  . RIGHT/LEFT HEART CATH AND CORONARY ANGIOGRAPHY N/A 03/24/2020   Procedure: RIGHT/LEFT HEART CATH AND CORONARY ANGIOGRAPHY;  Surgeon: Burnell Blanks, MD;  Location: [place] CV LAB;  Service: Cardiovascular;  Laterality: N/A;  . TEE WITHOUT CARDIOVERSION  10/04/2011   Procedure: TRANSESOPHAGEAL ECHOCARDIOGRAM (TEE);  Surgeon: Candee Furbish, MD;  Location: [facility] ENDOSCOPY;  Service: Cardiovascular;  Laterality: N/A;  . TEE WITHOUT CARDIOVERSION N/A 03/27/2020   Procedure: TRANSESOPHAGEAL ECHOCARDIOGRAM (TEE);  Surgeon: Prescott Gum, Collier Salina, MD;  Location: [place];  Service: Open Heart Surgery;  Laterality: N/A;  . TOTAL KNEE ARTHROPLASTY  2011   left  . TOTAL KNEE ARTHROPLASTY Right 02/24/2014   Procedure: RIGHT TOTAL KNEE ARTHROPLASTY;  Surgeon: Gearlean Alf, MD;  Location: [facility];  Service: Orthopedics;  Laterality: Right;    FAMILY HISTORY: Family History  Problem Relation Age of Onset  . Diabetes Mother   . Heart disease Mother   . Heart disease Father     SOCIAL HISTORY:  Social History   Socioeconomic History  . Marital status: Divorced    Spouse name: Not on file  . Number of children: 2  . Years of education: 12th  . Highest education level: Not on file  Occupational History  . Occupation: Information systems manager: OTHER    Comment: [employer]  Tobacco Use  . Smoking status: Never Smoker  . Smokeless tobacco: Never Used  Vaping Use  . Vaping Use: Never used  Substance and Sexual Activity    . Alcohol use: Yes    Alcohol/week: 3.0 standard drinks    Types: 3 Cans of beer per week    Comment: 3 beers a day   .  Drug use: No  . Sexual activity: Not Currently  Other Topics Concern  . Not on file  Social History Narrative   Patient is divorced with 2 children.   Patient is right handed.   Patient has hs education.   Patient drinks 2 cups daily.   Social Determinants of Health   Financial Resource Strain:   . Difficulty of Paying Living Expenses: Not on file  Food Insecurity:   . Worried About Charity fundraiser in the Last Year: Not on file  . Ran Out of Food in the Last Year: Not on file  Transportation Needs:   . Lack of Transportation (Medical): Not on file  . Lack of Transportation (Non-Medical): Not on file  Physical Activity:   . Days of Exercise per Week: Not on file  . Minutes of Exercise per Session: Not on file  Stress:   . Feeling of Stress : Not on file  Social Connections:   . Frequency of Communication with Friends and Family: Not on file  . Frequency of Social Gatherings with Friends and Family: Not on file  . Attends Religious Services: Not on file  . Active Member of Clubs or Organizations: Not on file  . Attends Archivist Meetings: Not on file  . Marital Status: Not on file  Intimate Partner Violence:   . Fear of Current or Ex-Partner: Not on file  . Emotionally Abused: Not on file  . Physically Abused: Not on file  . Sexually Abused: Not on file     PHYSICAL EXAM  GENERAL EXAM/CONSTITUTIONAL: Vitals:  Vitals:   08/24/20 1355  BP: (!) 171/76  Pulse: 69  Weight: 202 lb (91.6 kg)  Height: 5\' 11"  (1.803 m)     Body mass index is 28.17 kg/m. Wt Readings from Last 3 Encounters:  08/24/20 202 lb (91.6 kg)  08/21/20 193 lb 14.4 oz (88 kg)  07/29/20 196 lb (88.9 kg)     Patient is in no distress; well developed, nourished and groomed; neck is supple  CARDIOVASCULAR:  Examination of carotid arteries is normal; no  carotid bruits  Regular rate and rhythm, no murmurs  Examination of peripheral vascular system by observation and palpation is normal  EYES:  Ophthalmoscopic exam of optic discs and posterior segments is normal; no papilledema or hemorrhages  No exam data present  MUSCULOSKELETAL:  Gait, strength, tone, movements noted in Neurologic exam below  NEUROLOGIC: MENTAL STATUS:  No flowsheet data found.  awake, alert, oriented to person, place and time  recent and remote memory intact  normal attention and concentration  language fluent, comprehension intact, naming intact  fund of knowledge appropriate  CRANIAL NERVE:   2nd - no papilledema on fundoscopic exam  2nd, 3rd, 4th, 6th - pupils equal and reactive to light, visual fields full to confrontation, extraocular muscles intact, no nystagmus; LEFT PTOSIS  5th - facial sensation symmetric  7th - facial strength --> SYMM UPPER FACE; DECR MOVEMENT IN LEFT LOWER FACE WITH SWELLING (POST-SURGICAL)  8th - hearing intact  9th - palate elevates symmetrically, uvula midline  11th - shoulder shrug symmetric  12th - tongue --> DEVIATES TO LEFT  MILD HOARSE VOICE  MOTOR:   normal bulk and tone, full strength in the BUE, BLE  SENSORY:   normal and symmetric to light touch, temperature, vibration  COORDINATION:   finger-nose-finger, fine finger movements normal  REFLEXES:   deep tendon reflexes TRACE and symmetric  GAIT/STATION:   narrow based gait  DIAGNOSTIC DATA (LABS, IMAGING, TESTING) - I reviewed patient records, labs, notes, testing and imaging myself where available.  Lab Results  Component Value Date   WBC 5.5 08/18/2020   HGB 13.5 08/18/2020   HCT 40 (A) 08/18/2020   MCV 86.4 04/07/2020   PLT 203 08/18/2020      Component Value Date/Time   NA 132 (A) 08/18/2020 0000   K 4.2 08/18/2020 0000   CL 95 (A) 08/18/2020 0000   CO2 28 (A) 08/18/2020 0000   GLUCOSE 126 (H) 04/07/2020 1300    BUN 17 08/18/2020 0000   CREATININE 0.9 08/18/2020 0000   CREATININE 1.10 04/07/2020 1300   CREATININE 1.18 06/24/2019 1155   CALCIUM 9.7 08/18/2020 0000   PROT 5.4 (L) 03/28/2020 0456   ALBUMIN 4.3 08/18/2020 0000   AST 30 08/18/2020 0000   ALT 20 08/18/2020 0000   ALKPHOS 107 08/18/2020 0000   BILITOT 1.2 03/28/2020 0456   GFRNONAA >60 04/07/2020 1300   GFRNONAA 65 12/25/2017 1415   GFRAA >60 04/07/2020 1300   GFRAA 76 12/25/2017 1415   Lab Results  Component Value Date   CHOL 149 12/05/2011   HDL 56 12/05/2011   LDLCALC 68 12/05/2011   TRIG 123 12/05/2011   CHOLHDL 2.7 12/05/2011   Lab Results  Component Value Date   HGBA1C 4.9 03/26/2020   Lab Results  Component Value Date   NIOEVOJJ00 938 03/21/2020   Lab Results  Component Value Date   TSH 1.157 03/26/2020    12/05/11 MRI brain 1. No acute intracranial abnormality.  2. Subtle left medullary and right inferior cerebellar encephalomalacia corresponding to the December infarcts.   12/07/11 MRA head  - Mildly degraded by motion artifact. Negative intracranial MRA.  10/04/11 TEE  - No cardiac source of emboli was indentified.    ASSESSMENT AND PLAN  74 y.o. year old male here with history of seizure since 44. 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 Overall stable. Last seizure in 2012.   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 lamoTRIgine (LAMICTAL) 100 MG tablet    Sig: Take 1 tablet (100 mg total) by mouth daily AND 2 tablets (200 mg total) at  bedtime.    Dispense:  270 tablet    Refill:  4   Return in about 1 year (around 08/24/2021) for with NP (Amy Lomax), virtual visit (15 min).    Penni Bombard, MD 18/11/9935, 1:69 PM Certified in Neurology, Neurophysiology and Neuroimaging  [facility] [address] [place], [place] [address] [phone]

## 2020-08-25 ENCOUNTER — Ambulatory Visit: Payer: Self-pay | Admitting: Diagnostic Neuroimaging

## 2020-09-29 ENCOUNTER — Ambulatory Visit (INDEPENDENT_AMBULATORY_CARE_PROVIDER_SITE_OTHER): Payer: PPO | Admitting: Adult Health

## 2020-09-29 ENCOUNTER — Encounter: Payer: Self-pay | Admitting: Adult Health

## 2020-09-29 ENCOUNTER — Other Ambulatory Visit: Payer: Self-pay

## 2020-09-29 VITALS — BP 164/82 | HR 68 | Temp 98.9°F | Ht 69.5 in | Wt 198.6 lb

## 2020-09-29 DIAGNOSIS — Z951 Presence of aortocoronary bypass graft: Secondary | ICD-10-CM

## 2020-09-29 DIAGNOSIS — E782 Mixed hyperlipidemia: Secondary | ICD-10-CM

## 2020-09-29 DIAGNOSIS — I10 Essential (primary) hypertension: Secondary | ICD-10-CM

## 2020-09-29 DIAGNOSIS — Z0000 Encounter for general adult medical examination without abnormal findings: Secondary | ICD-10-CM | POA: Diagnosis not present

## 2020-09-29 DIAGNOSIS — G40909 Epilepsy, unspecified, not intractable, without status epilepticus: Secondary | ICD-10-CM | POA: Diagnosis not present

## 2020-09-29 DIAGNOSIS — I25810 Atherosclerosis of coronary artery bypass graft(s) without angina pectoris: Secondary | ICD-10-CM

## 2020-09-29 DIAGNOSIS — I639 Cerebral infarction, unspecified: Secondary | ICD-10-CM

## 2020-09-29 MED ORDER — CARVEDILOL 6.25 MG PO TABS: 6.2500 mg | ORAL_TABLET | Freq: Two times a day (BID) | ORAL | 0 refills | Status: DC

## 2020-09-29 NOTE — Progress Notes (Signed)
Subjective:    Patient ID: Connor [name], male    DOB: 1946/05/04, 74 y.o.   MRN: [id number]  HPI Patient presents for yearly preventative medicine examination. 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  Epilepsy -currently prescribed Lamictal and has not had a seizure in multiple years.  Is followed by neurology on a routine basis.  CAD- s/p CABG/CHF -was admitted in May 2021 with new onset of heart failure.  Echocardiogram showed new LV dysfunction with an EF of 25 to 30%, grade 2 DD, and trace AI.  Cardiac catheterization revealed triple-vessel disease with 99% ostial RCA, 80% proximal RCA, 80% ostial to proximal LAD, 70% D1, 50% ostial to proximal left circumflex lesions, 90% ostial left main.  He underwent CABG x3 with LIMA to LAD, SVG to OM and SVG to PDA on 03/27/2020.  Currently denies shortness of breath or chest pain  Hyperlipidemia-takes Crestor 40 mg daily.  Denies issues with myalgia   History of CVA -6 years ago?Marland Kitchen  Continues to have balance issues but no recent falls.  Prostate cancer-followed by oncology service at [facility] cancer center.  Is currently doing Lupron injections.  Prior to Lupron he went to radiation but PSA started to go back up and was started on Lupron.  He reports that his oncologist is going to give him about a 17-month vacation from Lupron due to chronic fatigue from this medication  Hypertension -in September 2021 at follow-up with his cardiologist his blood pressures were elevated at 160/92 and he was seen blood pressures at home as high as 170.  He was started on carvedilol 3.125 mg  twice daily with directions to titrate up if necessary.  He reports that at home he has been getting elevated blood pressure readings still in the 160s over 80s.  Does not have a follow-up appointment with cardiology for 6 months  BP Readings from Last 3 Encounters:  09/29/20 (!) 164/82  08/24/20 (!) 171/76  07/29/20 (!) 179/95    All immunizations and health maintenance protocols were reviewed with the patient and needed orders were placed.  Appropriate screening laboratory values were ordered for the patient including screening of hyperlipidemia, renal function and hepatic function. If indicated by BPH, a PSA was ordered.  Medication reconciliation,  past medical history, social history, problem list and allergies were reviewed in detail with the patient  Goals were established with regard to weight loss, exercise, and  diet in compliance with medications   Review of Systems  Constitutional: Negative.   HENT: Positive for dental problem.   Eyes: Negative.   Respiratory: Negative.   Cardiovascular: Negative.   Gastrointestinal: Negative.   Endocrine: Negative.   Genitourinary: Negative.   Musculoskeletal: Negative.   Skin: Negative.   Allergic/Immunologic: Negative.   Neurological: Negative.   Hematological: Negative.   Psychiatric/Behavioral: Negative.   All other systems reviewed and are negative.  Past Medical History:  Diagnosis Date  . Arthritis    OA AND PAIN RT KNEE  . Cancer ([place])    h/o neck - ABOUT 6 YRS AGO - TX'D WITH RADIATION AND CHEMO   . Chronic kidney disease   . GERD without esophagitis 03/21/2020  .  Head and neck cancer ~ 2009   S/P radiation & [name]  . Headache(784.0)   . Hyperlipidemia   . Hypertension   . Kidney carcinoma ([place])    h/o - NEPHRECTOMY   . Malignant neoplasm of prostate ([place])   . 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 Prostate cancer ([place]) 05/20/14   Gleason 4+3=7, volume 25 gm  . Radiation  2015   hx of, prostate cancer  . Seizures ([place])    hx of x yrs, "the bad kind;bite tongue; STATES LAST [place] 2013; WAS SEEING [name] - HE RETIRED AND PT LAST SAW DR. [name]  . Stroke ([place]) [place] 2013   UNABLE TO SPEAK OR MOVE AND RT SIDE WEAKNESS AND LOSS OF SKIN SENSITIVITY TO HEAT AND COLD ON RT SIDE, DOUBLE VISION. BALANCE PROBLEMS---STATES STILL HAS DOUBLE VISION AND BALANCE PROBLEM AND RT SIDED LOSS OF SKIN SENSITIVITY    Social History   Socioeconomic History  . Marital status: Divorced    Spouse name: Not on file  . Number of children: 2  . Years of education: 12th  . Highest education level: Not on file  Occupational History  . Occupation: Information systems manager: OTHER    Comment: [employer]  Tobacco Use  . Smoking status: Never Smoker  . Smokeless tobacco: Never Used  Vaping Use  . Vaping Use: Never used  Substance and Sexual Activity  . Alcohol use: Yes    Alcohol/week: 3.0 standard drinks    Types: 3 Cans of beer per week    Comment: 3 beers a day   . Drug use: No  . Sexual activity: Not Currently  Other Topics Concern  . Not on file  Social History Narrative   Patient is divorced with 2 children.   Patient is right handed.   Patient has hs education.   Patient drinks 2 cups daily.   Social Determinants of Health   Financial Resource Strain:   . Difficulty of Paying Living Expenses: Not on file  Food Insecurity:   . Worried About Charity fundraiser in the Last Year: Not on file  . Ran Out of Food in the Last Year: Not on file  Transportation Needs:   . Lack of Transportation (Medical): Not on file  . Lack of Transportation (Non-Medical): Not on file  Physical Activity:   . Days of Exercise per Week: Not on file  . Minutes of Exercise per Session: Not on file  Stress:   . Feeling of Stress : Not on file  Social Connections:   . Frequency of Communication with Friends and Family: Not on file  . Frequency of  Social Gatherings with Friends and Family: Not on file  . Attends Religious Services: Not on file  . Active Member of Clubs or Organizations: Not on file  . Attends Archivist Meetings: Not on file  . Marital Status: Not on file  Intimate Partner Violence:   . Fear of Current or Ex-Partner: Not on file  . Emotionally Abused: Not on file  . Physically Abused: Not on file  . Sexually Abused: Not on file    Past Surgical History:  Procedure Laterality Date  . CORONARY ARTERY BYPASS GRAFT N/A 03/27/2020   Procedure: CORONARY ARTERY BYPASS GRAFTING (CABG) x Three, using left internal mammary artery and right leg greater saphenous vein harvested endoscopically;  Surgeon: Prescott Gum,  Collier Salina, MD;  Location: [facility];  Service: Open Heart Surgery;  Laterality: N/A;  . hydrocelectomy  11/2000   left  . IR FLUORO GUIDE CV LINE RIGHT  08/31/2017  . IR GASTROSTOMY TUBE MOD SED  04/07/2020  . IR GASTROSTOMY TUBE REMOVAL  07/20/2020  . IR US GUIDE VASC ACCESS RIGHT  08/31/2017  . JOINT REPLACEMENT     LEFT TOTAL KNEE ARTHROPLASTY  . MOUTH SURGERY  2019  . NEPHRECTOMY  1990's   left  . NEPHRECTOMY    . PROSTATE BIOPSY  05/20/14   Gleason 4+3=7, vol 25 gm  . RIGHT/LEFT HEART CATH AND CORONARY ANGIOGRAPHY N/A 03/24/2020   Procedure: RIGHT/LEFT HEART CATH AND CORONARY ANGIOGRAPHY;  Surgeon: Burnell Blanks, MD;  Location: [facility];  Service: Cardiovascular;  Laterality: N/A;  . TEE WITHOUT CARDIOVERSION  10/04/2011   Procedure: TRANSESOPHAGEAL ECHOCARDIOGRAM (TEE);  Surgeon: Candee Furbish, MD;  Location: [facility];  Service: Cardiovascular;  Laterality: N/A;  . TEE WITHOUT CARDIOVERSION N/A 03/27/2020   Procedure: TRANSESOPHAGEAL ECHOCARDIOGRAM (TEE);  Surgeon: Prescott Gum, Collier Salina, MD;  Location: [facility];  Service: Open Heart Surgery;  Laterality: N/A;  . TOTAL KNEE ARTHROPLASTY  2011   left  . TOTAL KNEE ARTHROPLASTY Right 02/24/2014   Procedure: RIGHT TOTAL KNEE ARTHROPLASTY;  Surgeon:  Gearlean Alf, MD;  Location: [facility];  Service: Orthopedics;  Laterality: Right;    Family History  Problem Relation Age of Onset  . Diabetes Mother   . Heart disease Mother   . Heart disease Father     Allergies  Allergen Reactions  . Morphine And Related Nausea And Vomiting    Current Outpatient Medications on File Prior to Visit  Medication Sig Dispense Refill  . AMLODIPINE BESYLATE-VALSARTAN PO Take 1 tablet by mouth daily. $RemoveBefo'5mg'kkmNxLKNpuS$  orally    . aspirin EC 81 MG tablet Take 81 mg by mouth daily. Swallow whole.    . Bicalutamide (CASODEX PO) Take by mouth as directed.    . calcium-vitamin D (OSCAL WITH D) 500-200 MG-UNIT tablet Take 1 tablet by mouth 2 (two) times daily. $RemoveBefo'1500mg'qOaeVzIPlwu$  / 200unit tablet    . chlorhexidine (PERIDEX) 0.12 % solution RINSE 15 ML BY MOUTH OR THROAT TWICE DAILY 473 mL 0  . Cyanocobalamin (B-12 PO) Take by mouth.    . lamoTRIgine (LAMICTAL) 100 MG tablet Take 1 tablet (100 mg total) by mouth daily AND 2 tablets (200 mg total) at bedtime. 270 tablet 4  . Leuprolide Acetate (LUPRON IJ) Inject as directed. Every 90 Days    . rosuvastatin (CRESTOR) 20 MG tablet Place 1 tablet (20 mg total) into feeding tube daily. 90 tablet 3   No current facility-administered medications on file prior to visit.    BP (!) 164/82 (BP Location: Left Arm, Patient Position: Sitting, Cuff Size: Large)   Pulse 68   Temp 98.9 F (37.2 C) (Oral)   Ht 5' 9.5" (1.765 m)   Wt 198 lb 9.6 oz (90.1 kg)   BMI 28.91 kg/m       Objective:   Physical Exam Vitals and nursing note reviewed.  Constitutional:      General: He is not in acute distress.    Appearance: Normal appearance. He is well-developed and normal weight.  HENT:     Head: Normocephalic and atraumatic.     Right Ear: Tympanic membrane, ear canal and external ear normal. There is no impacted cerumen.     Left Ear: Tympanic membrane, ear canal and  external ear normal. There is no impacted cerumen.     Nose: Nose normal. No  congestion or rhinorrhea.     Mouth/Throat:     Mouth: Mucous membranes are moist.     Dentition: Abnormal dentition.     Pharynx: Oropharynx is clear. No oropharyngeal exudate or posterior oropharyngeal erythema.  Eyes:     General:        Right eye: No discharge.        Left eye: No discharge.     Extraocular Movements: Extraocular movements intact.     Conjunctiva/sclera: Conjunctivae normal.     Pupils: Pupils are equal, round, and reactive to light.  Neck:     Vascular: No carotid bruit.     Trachea: No tracheal deviation.  Cardiovascular:     Rate and Rhythm: Normal rate and regular rhythm.     Pulses: Normal pulses.     Heart sounds: Normal heart sounds. No murmur heard.  No friction rub. No gallop.   Pulmonary:     Effort: Pulmonary effort is normal. No respiratory distress.     Breath sounds: Normal breath sounds. No stridor. No wheezing, rhonchi or rales.  Chest:     Chest wall: No tenderness.  Abdominal:     General: Bowel sounds are normal. There is no distension.     Palpations: Abdomen is soft. There is no mass.     Tenderness: There is no abdominal tenderness. There is no right CVA tenderness, left CVA tenderness, guarding or rebound.     Hernia: No hernia is present.  Musculoskeletal:        General: No swelling, tenderness, deformity or signs of injury. Normal range of motion.     Right lower leg: No edema.     Left lower leg: No edema.  Lymphadenopathy:     Cervical: No cervical adenopathy.  Skin:    General: Skin is warm and dry.     Capillary Refill: Capillary refill takes less than 2 seconds.     Coloration: Skin is not jaundiced or pale.     Findings: No bruising, erythema, lesion or rash.  Neurological:     General: No focal deficit present.     Mental Status: He is alert and oriented to person, place, and time.     Cranial Nerves: No cranial nerve deficit.     Sensory: No sensory deficit.     Motor: No weakness.     Coordination: Coordination  normal.     Gait: Gait normal.     Deep Tendon Reflexes: Reflexes normal.  Psychiatric:        Mood and Affect: Mood normal.        Behavior: Behavior normal.        Thought Content: Thought content normal.        Judgment: Judgment normal.       Assessment & Plan:  1. Routine general medical examination at a health care facility - Encouraged heart healthy diet and to stay active  - Follow up in one year or sooner if needed - CBC with Differential/Platelet; Future - CMP with eGFR([facility]); Future - Lipid panel; Future - TSH; Future - TSH - Lipid panel - CMP with eGFR([facility]) - CBC with Differential/Platelet  2. Mixed hyperlipidemia - Continue Crestor 40 mg  - CBC with Differential/Platelet; Future - CMP with eGFR([facility]); Future - Lipid panel; Future - TSH; Future - TSH - Lipid panel - CMP with eGFR([facility]) - CBC with Differential/Platelet  3.  S/P CABG x 3 - Follow up with Cardiology as directed - carvedilol (COREG) 6.25 MG tablet; Take 1 tablet (6.25 mg total) by mouth 2 (two) times daily.  Dispense: 60 tablet; Refill: 0 - CBC with Differential/Platelet; Future - CMP with eGFR([facility]); Future - Lipid panel; Future - TSH; Future - TSH - Lipid panel - CMP with eGFR(Quest) - CBC with Differential/Platelet  4. Essential hypertension - Will Increase Coreg to 6.25 mg BID. He will follow up with me in 2 weeks via mychart with BP readings. May need to titrate further.  - CBC with Differential/Platelet; Future - CMP with eGFR([facility]); Future - Lipid panel; Future - TSH; Future - TSH - Lipid panel - CMP with eGFR([facility]) - CBC with Differential/Platelet  5. Coronary artery disease involving coronary bypass graft of native heart without angina pectoris - Continue plan of care by Cardiology  - CBC with Differential/Platelet; Future - CMP with eGFR([facility]); Future - Lipid panel; Future - TSH; Future  6. Seizure disorder ([place]) - Follow up w/Neurology as directed -  Lamotrigine level; Future - Lamotrigine level  7. Cerebral infarction, unspecified mechanism ([place]) - CBC with Differential/Platelet; Future - CMP with eGFR([facility]); Future - Lipid panel; Future - TSH; Future  Dorothyann Peng, NP

## 2020-09-29 NOTE — Patient Instructions (Signed)
I will follow up with you tomorrow about your lab work   I am going to increase your Coreg to 6.25 mg ( you can take two tabs of what you have at home twice a day)   Send your BP readings in 2 weeks via [employer]

## 2020-09-30 LAB — CBC WITH DIFFERENTIAL/PLATELET
Absolute Monocytes: 675 cells/uL (ref 200–950)
Basophils Absolute: 50 cells/uL (ref 0–200)
Basophils Relative: 1 %
Eosinophils Absolute: 340 cells/uL (ref 15–500)
Eosinophils Relative: 6.8 %
HCT: 39.6 % (ref 38.5–50.0)
Hemoglobin: 13.8 g/dL (ref 13.2–17.1)
Lymphs Abs: 1330 cells/uL (ref 850–3900)
MCH: 31.4 pg (ref 27.0–33.0)
MCHC: 34.8 g/dL (ref 32.0–36.0)
MCV: 90.2 fL (ref 80.0–100.0)
MPV: 10.1 fL (ref 7.5–12.5)
Monocytes Relative: 13.5 %
Neutro Abs: 2605 cells/uL (ref 1500–7800)
Neutrophils Relative %: 52.1 %
Platelets: 262 10*3/uL (ref 140–400)
RBC: 4.39 10*6/uL (ref 4.20–5.80)
RDW: 12.3 % (ref 11.0–15.0)
Total Lymphocyte: 26.6 %
WBC: 5 10*3/uL (ref 3.8–10.8)

## 2020-09-30 LAB — COMPLETE METABOLIC PANEL WITH GFR
AG Ratio: 1.5 (calc) (ref 1.0–2.5)
ALT: 19 U/L (ref 9–46)
AST: 24 U/L (ref 10–35)
Albumin: 4.2 g/dL (ref 3.6–5.1)
Alkaline phosphatase (APISO): 87 U/L (ref 35–144)
BUN: 18 mg/dL (ref 7–25)
CO2: 28 mmol/L (ref 20–32)
Calcium: 9.4 mg/dL (ref 8.6–10.3)
Chloride: 95 mmol/L — ABNORMAL LOW (ref 98–110)
Creat: 1.07 mg/dL (ref 0.70–1.18)
GFR, Est African American: 80 mL/min/{1.73_m2} (ref >=60)
GFR, Est Non African American: 69 mL/min/{1.73_m2} (ref >=60)
Globulin: 2.8 g/dL (calc) (ref 1.9–3.7)
Glucose, Bld: 101 mg/dL — ABNORMAL HIGH (ref 65–99)
Potassium: 4.5 mmol/L (ref 3.5–5.3)
Sodium: 132 mmol/L — ABNORMAL LOW (ref 135–146)
Total Bilirubin: 0.5 mg/dL (ref 0.2–1.2)
Total Protein: 7 g/dL (ref 6.1–8.1)

## 2020-09-30 LAB — LIPID PANEL
Cholesterol: 153 mg/dL (ref <200)
HDL: 71 mg/dL (ref >=40)
LDL Cholesterol (Calc): 67 mg/dL (calc)
Non-HDL Cholesterol (Calc): 82 mg/dL (calc) (ref <130)
Total CHOL/HDL Ratio: 2.2 (calc) (ref <5.0)
Triglycerides: 74 mg/dL (ref <150)

## 2020-09-30 LAB — TSH: TSH: 0.89 mIU/L (ref 0.40–4.50)

## 2020-10-02 LAB — LAMOTRIGINE LEVEL: Lamotrigine Lvl: 6 ug/mL (ref 4.0–18.0)

## 2020-10-04 ENCOUNTER — Other Ambulatory Visit: Payer: Self-pay | Admitting: Adult Health

## 2020-11-19 ENCOUNTER — Inpatient Hospital Stay: Payer: PPO | Attending: Oncology

## 2020-11-19 ENCOUNTER — Other Ambulatory Visit: Payer: Self-pay

## 2020-11-19 ENCOUNTER — Other Ambulatory Visit: Payer: Self-pay | Admitting: Oncology

## 2020-11-19 DIAGNOSIS — C61 Malignant neoplasm of prostate: Secondary | ICD-10-CM

## 2020-11-19 NOTE — Progress Notes (Signed)
[facility]  [address] [place],  [place]  [address] [phone]  Clinic Day:  11/20/2020  Referring physician: Dorothyann Peng, NP   HISTORY OF PRESENT ILLNESS:  The patient is a 75 y.o. 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  The patient has been off and on complete androgen blockade, consisting of Lupron/Casodex.  As his PSA became undetectable again at his last visit, the patient elected to stop his treatments.   He comes in to reassess the status of his disease.  Since his last visit, the patient has been doing okay.  He also denies having any bone pain or other symptoms which concern him for overt signs of disease progression.    PHYSICAL EXAM:  Blood pressure (!) 200/95, pulse 70, temperature 97.8 F (36.6 C), resp. rate 16, height 5' 9.5" (1.765 m), weight 199 lb 6.4 oz (90.4 kg), SpO2 98 %. Wt Readings from Last 3 Encounters:  11/20/20 199 lb 6.4 oz (90.4 kg)  09/29/20 198 lb 9.6 oz (90.1 kg)  08/24/20 202 lb (91.6 kg)   Body mass index is 29.02 kg/m. Performance status (ECOG): 0 Physical Exam Constitutional:      Appearance: Normal appearance. He is not ill-appearing.  HENT:     Mouth/Throat:     Mouth: Mucous membranes are moist.     Pharynx: Oropharynx is clear. No oropharyngeal exudate or posterior oropharyngeal erythema.  Cardiovascular:     Rate and Rhythm: Normal rate and regular rhythm.     Heart sounds: No murmur heard. No friction rub. No gallop.   Pulmonary:     Effort: Pulmonary effort is normal. No respiratory distress.     Breath sounds: Normal breath sounds. No wheezing, rhonchi or rales.  Chest:  Breasts:     Right: No axillary adenopathy or supraclavicular adenopathy.     Left: No axillary adenopathy or supraclavicular adenopathy.    Abdominal:     General: Bowel sounds are normal. There is no distension.     Palpations: Abdomen is soft. There is no mass.      Tenderness: There is no abdominal tenderness.  Musculoskeletal:        General: No swelling.     Right lower leg: No edema.     Left lower leg: No edema.  Lymphadenopathy:     Cervical: No cervical adenopathy.     Upper Body:     Right upper body: No supraclavicular or axillary adenopathy.     Left upper body: No supraclavicular or axillary adenopathy.     Lower Body: No right inguinal adenopathy. No left inguinal adenopathy.  Skin:    General: Skin is warm.     Coloration: Skin is not jaundiced.     Findings: No lesion or rash.  Neurological:     General: No focal deficit present.     Mental Status: He is alert and oriented to person, place, and time. Mental status is at baseline.     Cranial Nerves: Cranial nerves are intact.  Psychiatric:        Mood and Affect: Mood normal.        Behavior: Behavior normal.        Thought Content: Thought content normal.     LABS:    Ref. Range 11/19/2020 12:24  Prostate Specific Ag, Serum Latest Ref Range: 0.0 - 4.0 ng/mL 0.3      ASSESSMENT & PLAN:  Assessment/Plan:  A 74  y.o. 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  Once again, his PSA has gone back to being detectable at 0.3.  Nevertheless, the patient remains asymptomatic.  As his quality of life remains excellent off complete androgen blockade, he wishes to stay off of it, for which I will oblige.  I will see him back in another 3 months for repeat clinical assessment. The patient understands all the plans discussed today and is in agreement with them.    Kenae Lindquist Macarthur Critchley, MD

## 2020-11-20 ENCOUNTER — Inpatient Hospital Stay (INDEPENDENT_AMBULATORY_CARE_PROVIDER_SITE_OTHER): Payer: PPO | Admitting: Oncology

## 2020-11-20 ENCOUNTER — Telehealth: Payer: Self-pay | Admitting: Oncology

## 2020-11-20 VITALS — BP 200/95 | HR 70 | Temp 97.8°F | Resp 16 | Ht 69.5 in | Wt 199.4 lb

## 2020-11-20 DIAGNOSIS — C61 Malignant neoplasm of prostate: Secondary | ICD-10-CM | POA: Diagnosis not present

## 2020-11-20 LAB — PROSTATE-SPECIFIC AG, SERUM (LABCORP): Prostate Specific Ag, Serum: 0.3 ng/mL (ref 0.0–4.0)

## 2020-11-20 NOTE — Telephone Encounter (Signed)
Per 1/28 los next appt sched and given to patient 

## 2020-11-26 ENCOUNTER — Other Ambulatory Visit: Payer: Self-pay | Admitting: Adult Health

## 2020-11-26 DIAGNOSIS — Z951 Presence of aortocoronary bypass graft: Secondary | ICD-10-CM

## 2020-11-27 NOTE — Telephone Encounter (Signed)
I have scheduled the pt for his BP follow up.  He requested a virtual visit.  He has a BP cuff at home.  He stated he does not need a refill of medication at this time.

## 2020-12-01 ENCOUNTER — Encounter: Payer: Self-pay | Admitting: Adult Health

## 2020-12-01 ENCOUNTER — Other Ambulatory Visit: Payer: Self-pay | Admitting: Adult Health

## 2020-12-01 ENCOUNTER — Telehealth (INDEPENDENT_AMBULATORY_CARE_PROVIDER_SITE_OTHER): Payer: PPO | Admitting: Adult Health

## 2020-12-01 ENCOUNTER — Other Ambulatory Visit: Payer: Self-pay

## 2020-12-01 VITALS — BP 146/86 | Wt 199.0 lb

## 2020-12-01 DIAGNOSIS — I10 Essential (primary) hypertension: Secondary | ICD-10-CM | POA: Diagnosis not present

## 2020-12-01 DIAGNOSIS — Z951 Presence of aortocoronary bypass graft: Secondary | ICD-10-CM

## 2020-12-01 NOTE — Progress Notes (Signed)
Virtual Visit via Telephone Note  I connected with Connor [name] on 12/01/20 at  3:00 PM EST by telephone and verified that I am speaking with the correct person using two identifiers.   I discussed the limitations, risks, security and privacy concerns of performing an evaluation and management service by telephone and the availability of in person appointments. I also discussed with the patient that there may be a patient responsible charge related to this service. The patient expressed understanding and agreed to proceed.  Location patient: home Location provider: work or home office Participants present for the call: patient, provider Patient did not have a visit in the prior 7 days to address this/these issue(s).   History of Present Illness: 75 year old male who is being evaluated today for follow-up regarding hypertension.  He was last seen on September 29, 2020 for his CPE.  At this time cardiology had placed him on Coreg 3.125 twice daily with the instructions to titrate up if necessary for elevated blood pressure readings in the 680H to 212Y systolic.  During his visit in December we increased his Coreg to 6.25 mg as his blood pressures were still in the 160s.  Today on follow-up he reports that at home most of his blood pressures have been in the 482N systolic but he has seen upwards of 150s with lows that are very infrequent in the 115's.  His pulse has been stable in the high 60s.   Observations/Objective: Patient sounds cheerful and well on the phone. I do not appreciate any SOB. Speech and thought processing are grossly intact. Patient reported vitals:  Assessment and Plan: 1. Essential hypertension - Will increase Coreg to 12.5 mg BID and have him follow up in 2 weeks.  If blood pressure is dropping too low and he becomes symptomatic he was advised to follow-up sooner.   Follow Up Instructions:  I did not refer this patient for an OV in the next 24 hours for this/these  issue(s).  I discussed the assessment and treatment plan with the patient. The patient was provided an opportunity to ask questions and all were answered. The patient agreed with the plan and demonstrated an understanding of the instructions.   The patient was advised to call back or seek an in-person evaluation if the symptoms worsen or if the condition fails to improve as anticipated.  I provided 11 minutes of non-face-to-face time during this encounter.   Dorothyann Peng, NP

## 2020-12-02 ENCOUNTER — Other Ambulatory Visit: Payer: Self-pay | Admitting: Adult Health

## 2020-12-03 NOTE — Telephone Encounter (Signed)
SENT TO THE PHARMACY BY E-SCRIBE. 

## 2021-01-11 ENCOUNTER — Other Ambulatory Visit: Payer: Self-pay | Admitting: Adult Health

## 2021-01-11 DIAGNOSIS — Z951 Presence of aortocoronary bypass graft: Secondary | ICD-10-CM

## 2021-01-13 ENCOUNTER — Other Ambulatory Visit: Payer: Self-pay | Admitting: Adult Health

## 2021-01-13 DIAGNOSIS — Z951 Presence of aortocoronary bypass graft: Secondary | ICD-10-CM

## 2021-01-15 ENCOUNTER — Ambulatory Visit: Payer: PPO | Admitting: Internal Medicine

## 2021-01-15 ENCOUNTER — Other Ambulatory Visit: Payer: Self-pay

## 2021-01-15 ENCOUNTER — Encounter: Payer: Self-pay | Admitting: Internal Medicine

## 2021-01-15 VITALS — BP 138/78 | HR 67 | Ht 69.5 in | Wt 201.0 lb

## 2021-01-15 DIAGNOSIS — I255 Ischemic cardiomyopathy: Secondary | ICD-10-CM

## 2021-01-15 DIAGNOSIS — I10 Essential (primary) hypertension: Secondary | ICD-10-CM

## 2021-01-15 DIAGNOSIS — I25810 Atherosclerosis of coronary artery bypass graft(s) without angina pectoris: Secondary | ICD-10-CM | POA: Diagnosis not present

## 2021-01-15 DIAGNOSIS — Z951 Presence of aortocoronary bypass graft: Secondary | ICD-10-CM

## 2021-01-15 MED ORDER — VALSARTAN 80 MG PO TABS: 80.0000 mg | ORAL_TABLET | Freq: Every day | ORAL | 3 refills | Status: DC

## 2021-01-15 NOTE — Patient Instructions (Signed)
Medication Instructions:  Your physician has recommended you make the following change in your medication:  -- STOP amlodipine/valsartan -- START valsartan 80mg  daily  *If you need a refill on your cardiac medications before your next appointment, please call your pharmacy*  Follow-Up: At [facility], you and your health needs are our priority.  As part of our continuing mission to provide you with exceptional heart care, we have created designated Provider Care Teams.  These Care Teams include your primary Cardiologist (physician) and Advanced Practice Providers (APPs -  Physician Assistants and Nurse Practitioners) who all work together to provide you with the care you need, when you need it.  We recommend signing up for the patient portal called "MyChart".  Sign up information is provided on this After Visit Summary.  MyChart is used to connect with patients for Virtual Visits (Telemedicine).  Patients are able to view lab/test results, encounter notes, upcoming appointments, etc.  Non-urgent messages can be sent to your provider as well.   To learn more about what you can do with MyChart, go to [email or web address].    Your next appointment:   12 month(s)  The format for your next appointment:   In Person  Provider:   You may see Pixie Casino, MD or one of the following Advanced Practice Providers on your designated Care Team:    [name], PA-C  Fabian Sharp, PA-C or   Roby Lofts, [place]    Other Instructions

## 2021-01-15 NOTE — Progress Notes (Signed)
OFFICE NOTE  Chief Complaint:  Hospital follow-up  Primary Care Physician: Dorothyann Peng, NP  HPI:  Connor [name] is a 75 y.o. male who is being seen today for the evaluation of coronary artery disease at the request of Dorothyann Peng, NP.  [name] returns today for hospital follow-up.  He is actually been seen already by [name], PA-C for follow-up of CABG which she underwent in June 2021.  His past medical history significant for squamous cell carcinoma with tonsillar resection, chemotherapy and radiation, multiple strokes, hypertension, dyslipidemia and history of seizures.  He had orthostatic hypotension and heart failure with new LV dysfunction and EF 25 to 30%.  He underwent cardiac catheterization which showed multivessel coronary disease and had CABG x3 with LIMA to LAD, SVG to OM and SVG to PDA on 03/27/2020.  Although he is recovered well he had persistent orthostasis and heart failure symptoms.  This required midodrine for a period of time and was not on goal-directed medical therapy.  Subsequently his LV function has now normalized with an EF of percent by echo on 07/09/2020.  Overall he feels well.  He still has some issues with dizziness.  He is not been able to go back to playing golf.  Blood pressures although have rebounded as well to his preprocedure hypertension.  He is 160/92 today and is seeing blood pressures as high as 170.  01/15/2021  Mr. Clack is doing well.  He denies any chest pain or shortness of breath.  He is now weighs out from his bypass which was June 2021.  Blood pressure has recovered.  For a while he was on midodrine, but now has required increasing doses of carvedilol.  He has some evening hypertension as well.  He was on a combination amlodipine/valsartan and but has been trying to cut that in half because it seems to lower his blood pressure too much.  I actually think we should scrap it completely as there is no cardiovascular benefit directly from  amlodipine.  PMHx:  Past Medical History:  Diagnosis Date  . Arthritis    OA AND PAIN RT KNEE  . Cancer ([place])    h/o neck - ABOUT 6 YRS AGO - TX'D WITH RADIATION AND CHEMO   . Chronic kidney disease   . GERD without esophagitis 03/21/2020  . Head and neck cancer ~ 2009   S/P radiation & [name]  . Headache(784.0)   . Hyperlipidemia   . Hypertension   . Kidney carcinoma ([place])    h/o - NEPHRECTOMY   . Malignant neoplasm of prostate ([place])   . 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 Prostate cancer ([place]) 05/20/14   Gleason 4+3=7, volume 25 gm  . Radiation 2015   hx of, prostate cancer  . Seizures ([place])    hx of x yrs, "the bad kind;bite tongue; STATES LAST [place] 2013; WAS SEEING [name] - HE RETIRED AND PT LAST SAW DR. [name]  . Stroke ([place]) [place] 2013   UNABLE TO SPEAK OR MOVE AND RT SIDE WEAKNESS AND LOSS OF SKIN SENSITIVITY TO HEAT AND COLD ON RT SIDE, DOUBLE VISION. BALANCE PROBLEMS---STATES STILL HAS DOUBLE VISION AND BALANCE PROBLEM AND RT SIDED LOSS OF SKIN SENSITIVITY    Past Surgical History:  Procedure Laterality Date  . CORONARY ARTERY BYPASS GRAFT N/A 03/27/2020   Procedure: CORONARY ARTERY BYPASS GRAFTING (CABG) x Three, using left internal  mammary artery and right leg greater saphenous vein harvested endoscopically;  Surgeon: Ivin Poot, MD;  Location: [facility];  Service: Open Heart Surgery;  Laterality: N/A;  . hydrocelectomy  11/2000   left  . IR FLUORO GUIDE CV LINE RIGHT  08/31/2017  . IR GASTROSTOMY TUBE MOD SED  04/07/2020  . IR GASTROSTOMY TUBE REMOVAL  07/20/2020  . IR US GUIDE VASC ACCESS RIGHT  08/31/2017  . JOINT REPLACEMENT     LEFT TOTAL KNEE ARTHROPLASTY  . MOUTH SURGERY  2019  . NEPHRECTOMY  1990's   left  . NEPHRECTOMY    . PROSTATE BIOPSY  05/20/14   Gleason 4+3=7, vol 25 gm  . RIGHT/LEFT HEART CATH AND CORONARY ANGIOGRAPHY N/A 03/24/2020   Procedure: RIGHT/LEFT HEART CATH AND  CORONARY ANGIOGRAPHY;  Surgeon: Burnell Blanks, MD;  Location: [facility];  Service: Cardiovascular;  Laterality: N/A;  . TEE WITHOUT CARDIOVERSION  10/04/2011   Procedure: TRANSESOPHAGEAL ECHOCARDIOGRAM (TEE);  Surgeon: Candee Furbish, MD;  Location: [facility] ENDOSCOPY;  Service: Cardiovascular;  Laterality: N/A;  . TEE WITHOUT CARDIOVERSION N/A 03/27/2020   Procedure: TRANSESOPHAGEAL ECHOCARDIOGRAM (TEE);  Surgeon: Prescott Gum, Collier Salina, MD;  Location: [facility];  Service: Open Heart Surgery;  Laterality: N/A;  . TOTAL KNEE ARTHROPLASTY  2011   left  . TOTAL KNEE ARTHROPLASTY Right 02/24/2014   Procedure: RIGHT TOTAL KNEE ARTHROPLASTY;  Surgeon: Gearlean Alf, MD;  [facility];  Service: Orthopedics;  Laterality: Right;    FAMHx:  Family History  Problem Relation Age of Onset  . Diabetes Mother   . Heart disease Mother   . Heart disease Father     SOCHx:   reports that he has never smoked. He has never used smokeless tobacco. He reports current alcohol use of about 3.0 standard drinks of alcohol per week. He reports that he does not use drugs.  ALLERGIES:  Allergies  Allergen Reactions  . Morphine And Related Nausea And Vomiting    ROS: Pertinent items noted in HPI and remainder of comprehensive ROS otherwise negative.  HOME MEDS: Current Outpatient Medications on File Prior to Visit  Medication Sig Dispense Refill  . AMLODIPINE BESYLATE-VALSARTAN PO Take 1 tablet by mouth daily. 5mg  orally    . aspirin EC 81 MG tablet Take 81 mg by mouth daily. Swallow whole.    . Bicalutamide (CASODEX PO) Take by mouth as directed.    . calcium-vitamin D (OSCAL WITH D) 500-200 MG-UNIT tablet Take 1 tablet by mouth 2 (two) times daily. 1500mg  / 200unit tablet    . carvedilol (COREG) 12.5 MG tablet TAKE 1 TABLET(12.5 MG) BY MOUTH TWICE DAILY WITH A MEAL 60 tablet 0  . chlorhexidine (PERIDEX) 0.12 % solution RINSE AND GARGLE 15 ML BY MOUTH OR THROAT TWICE DAILY 473 mL 11  . Cyanocobalamin  (B-12 PO) Take by mouth.    . lamoTRIgine (LAMICTAL) 100 MG tablet Take 1 tablet (100 mg total) by mouth daily AND 2 tablets (200 mg total) at bedtime. 270 tablet 4  . rosuvastatin (CRESTOR) 20 MG tablet Place 1 tablet (20 mg total) into feeding tube daily. 90 tablet 3   No current facility-administered medications on file prior to visit.    LABS/IMAGING: No results found for this or any previous visit (from the past 48 hour(s)). No results found.  LIPID PANEL:    Component Value Date/Time   CHOL 153 09/29/2020 1326   TRIG 74 09/29/2020 1326   HDL 71 09/29/2020 1326  CHOLHDL 2.2 09/29/2020 1326   VLDL 25 12/05/2011 0730   LDLCALC 67 09/29/2020 1326    WEIGHTS: Wt Readings from Last 3 Encounters:  01/15/21 201 lb (91.2 kg)  12/01/20 199 lb (90.3 kg)  11/20/20 199 lb 6.4 oz (90.4 kg)    VITALS: BP 138/78 (BP Location: Left Arm, Patient Position: Sitting, Cuff Size: Normal)   Pulse 67   Ht 5' 9.5" (1.765 m)   Wt 201 lb (91.2 kg)   BMI 29.26 kg/m   EXAM: General appearance: alert and no distress Neck: no carotid bruit, no JVD and thyroid not enlarged, symmetric, no tenderness/mass/nodules Lungs: clear to auscultation bilaterally Heart: regular rate and rhythm Abdomen: soft, non-tender; bowel sounds normal; no masses,  no organomegaly Extremities: extremities normal, atraumatic, no cyanosis or edema Pulses: 2+ and symmetric Skin: Skin color, texture, turgor normal. No rashes or lesions Neurologic: Grossly normal Psych: Pleasant  EKG: Sinus rhythm first-degree AV block 67, nonspecific ST changes-personally reviewed  ASSESSMENT: 1. Coronary artery disease status post CABG x3 (LIMA to LAD, SVG to OM, SVG to PDA) -03/2020 2. Ischemic cardiomyopathy EF 25 to 30%, improved to 55 to 60% (07/09/2020)-NYHA class I-II symptoms 3. Uncontrolled hypertension 4. History of cancer 5. History of stroke 6. Dyslipidemia  PLAN: 1.   Mr. Babich continues to do well now more than 6  months out from his surgery.  His blood pressure has recovered.  He is having some hypertension in the evenings.  I asked him to stop his combination amlodipine/valsartan we will start monotherapy valsartan 80 mg in the morning.  He should continue his carvedilol.  Cholesterol is at goal.  He is not having any angina.  LVEF had recovered.  No further medicine changes. Follow-up annually or sooner as necessary.  Pixie Casino, MD, [facility], [place] Director of the Advanced Lipid Disorders &  Cardiovascular Risk Reduction Clinic Diplomate of the American Board of Clinical Lipidology Attending Cardiologist  Direct Dial: [phone]  Fax: [phone]  Website:  [email or web address] 01/15/2021, 1:45 PM

## 2021-01-27 NOTE — Progress Notes (Unsigned)
Subjective:   Connor [name] is a 75 y.o. male who presents for an Initial Medicare Annual Wellness Visit.  Review of Systems    n/a       Objective:    There were no vitals filed for this visit. There is no height or weight on file to calculate BMI.  Advanced Directives 11/20/2020 03/22/2020 03/21/2020 11/17/2014 11/03/2014 08/26/2014 06/24/2014  Does Patient Have a Medical Advance Directive? Yes Yes Yes No No No No  Type of Paramedic of [place];Living will [place];Living will [place];Living will - - - -  Does patient want to make changes to medical advance directive? - No - Patient declined - - - - -  Would patient like information on creating a medical advance directive? - - - No - patient declined information No - patient declined information No - patient declined information No - patient declined information  Pre-existing out of facility DNR order (yellow form or pink MOST form) - - - - - - -    Current Medications (verified) Outpatient Encounter Medications as of 01/28/2021  Medication Sig  . aspirin EC 81 MG tablet Take 81 mg by mouth daily. Swallow whole.  . Bicalutamide (CASODEX PO) Take by mouth as directed.  . calcium-vitamin D (OSCAL WITH D) 500-200 MG-UNIT tablet Take 1 tablet by mouth 2 (two) times daily. 1500mg  / 200unit tablet  . carvedilol (COREG) 12.5 MG tablet TAKE 1 TABLET(12.5 MG) BY MOUTH TWICE DAILY WITH A MEAL  . chlorhexidine (PERIDEX) 0.12 % solution RINSE AND GARGLE 15 ML BY MOUTH OR THROAT TWICE DAILY  . Cyanocobalamin (B-12 PO) Take by mouth.  . lamoTRIgine (LAMICTAL) 100 MG tablet Take 1 tablet (100 mg total) by mouth daily AND 2 tablets (200 mg total) at bedtime.  . rosuvastatin (CRESTOR) 20 MG tablet Place 1 tablet (20 mg total) into feeding tube daily.  . valsartan (DIOVAN) 80 MG tablet Take 1 tablet (80 mg total) by mouth daily.   No facility-administered encounter medications on  file as of 01/28/2021.    Allergies (verified) Morphine and related   History: Past Medical History:  Diagnosis Date  . Arthritis    OA AND PAIN RT KNEE  . Cancer ([place])    h/o neck - ABOUT 6 YRS AGO - TX'D WITH RADIATION AND CHEMO   . Chronic kidney disease   . GERD without esophagitis 03/21/2020  . Head and neck cancer ~ 2009   S/P radiation & [name]  . Headache(784.0)   . Hyperlipidemia   . Hypertension   . Kidney carcinoma ([place])    h/o - NEPHRECTOMY   . Malignant neoplasm of prostate ([place])   . 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 Prostate cancer ([place]) 05/20/14   Gleason 4+3=7, volume 25 gm  . Radiation 2015   hx of, prostate cancer  . Seizures ([place])    hx of x yrs, "the bad kind;bite tongue; STATES LAST [place] 2013; WAS SEEING [name] - HE RETIRED AND PT LAST SAW DR. [name]  . Stroke ([place]) [place] 2013   UNABLE TO SPEAK OR MOVE AND RT SIDE WEAKNESS AND LOSS OF SKIN SENSITIVITY TO HEAT AND COLD ON RT SIDE, DOUBLE VISION. BALANCE PROBLEMS---STATES STILL HAS DOUBLE VISION AND BALANCE PROBLEM AND RT SIDED LOSS OF SKIN SENSITIVITY   Past Surgical History:  Procedure Laterality Date  . CORONARY  ARTERY BYPASS GRAFT N/A 03/27/2020   Procedure: CORONARY ARTERY BYPASS GRAFTING (CABG) x Three, using left internal mammary artery and right leg greater saphenous vein harvested endoscopically;  Surgeon: Ivin Poot, MD;  Location: [facility];  Service: Open Heart Surgery;  Laterality: N/A;  . hydrocelectomy  11/2000   left  . IR FLUORO GUIDE CV LINE RIGHT  08/31/2017  . IR GASTROSTOMY TUBE MOD SED  04/07/2020  . IR GASTROSTOMY TUBE REMOVAL  07/20/2020  . IR US GUIDE VASC ACCESS RIGHT  08/31/2017  . JOINT REPLACEMENT     LEFT TOTAL KNEE ARTHROPLASTY  . MOUTH SURGERY  2019  . NEPHRECTOMY  1990's   left  . NEPHRECTOMY    . PROSTATE BIOPSY  05/20/14   Gleason 4+3=7, vol 25 gm  . RIGHT/LEFT HEART CATH AND CORONARY ANGIOGRAPHY  N/A 03/24/2020   Procedure: RIGHT/LEFT HEART CATH AND CORONARY ANGIOGRAPHY;  Surgeon: Burnell Blanks, MD;  Location: [facility] CV LAB;  Service: Cardiovascular;  Laterality: N/A;  . TEE WITHOUT CARDIOVERSION  10/04/2011   Procedure: TRANSESOPHAGEAL ECHOCARDIOGRAM (TEE);  Surgeon: Candee Furbish, MD;  Location: [facility] ENDOSCOPY;  Service: Cardiovascular;  Laterality: N/A;  . TEE WITHOUT CARDIOVERSION N/A 03/27/2020   Procedure: TRANSESOPHAGEAL ECHOCARDIOGRAM (TEE);  Surgeon: Prescott Gum, Collier Salina, MD;  Location: [facility];  Service: Open Heart Surgery;  Laterality: N/A;  . TOTAL KNEE ARTHROPLASTY  2011   left  . TOTAL KNEE ARTHROPLASTY Right 02/24/2014   Procedure: RIGHT TOTAL KNEE ARTHROPLASTY;  Surgeon: Gearlean Alf, MD;  [facility];  Service: Orthopedics;  Laterality: Right;   Family History  Problem Relation Age of Onset  . Diabetes Mother   . Heart disease Mother   . Heart disease Father    Social History   Socioeconomic History  . Marital status: Divorced    Spouse name: Not on file  . Number of children: 2  . Years of education: 12th  . Highest education level: Not on file  Occupational History  . Occupation: Information systems manager: OTHER    Comment: [employer]  Tobacco Use  . Smoking status: Never Smoker  . Smokeless tobacco: Never Used  Vaping Use  . Vaping Use: Never used  Substance and Sexual Activity  . Alcohol use: Yes    Alcohol/week: 3.0 standard drinks    Types: 3 Cans of beer per week    Comment: 3 beers a day   . Drug use: No  . Sexual activity: Not Currently  Other Topics Concern  . Not on file  Social History Narrative   Patient is divorced with 2 children.   Patient is right handed.   Patient has hs education.   Patient drinks 2 cups daily.   Social Determinants of Health   Financial Resource Strain: Not on file  Food Insecurity: Not on file  Transportation Needs: Not on file  Physical Activity: Not on file  Stress: Not on file  Social  Connections: Not on file    Tobacco Counseling Counseling given: Not Answered   Clinical Intake:                 Diabetic?no         Activities of Daily Living In your present state of health, do you have any difficulty performing the following activities: 03/22/2020  Hearing? N  Comment Patient has hearing aids  Vision? N  Difficulty concentrating or making decisions? N  Walking or climbing stairs? Y  Dressing  or bathing? N  Doing errands, shopping? N  Some recent data might be hidden    Patient Care Team: Dorothyann Peng, NP as PCP - General (Family Medicine) Debara Pickett Nadean Corwin, MD as PCP - Cardiology (Cardiology) Tamela Oddi, DDS as Consulting Physician (Oral Surgery) Carlyle Basques, MD as Consulting Physician (Infectious Diseases) Sherwood Gambler, DDS (Dentistry) [name] Earlean Polka, MD as Consulting Physician (Neurology) Leonell Lobo, MD as Consulting Physician (Gastroenterology)  Indicate any recent Medical Services you may have received from other than [facility] providers in the past year (date may be approximate).     Assessment:   This is a routine wellness examination for [place].  Hearing/Vision screen No exam data present  Dietary issues and exercise activities discussed:    Goals   None    Depression Screen PHQ 2/9 Scores 09/29/2020 04/13/2020 08/26/2019 07/22/2019 02/05/2019 08/07/2018 05/03/2018  PHQ - 2 Score 0 0 0 0 0 0 0    Fall Risk Fall Risk  09/29/2020 04/13/2020 08/26/2019 07/22/2019 05/03/2018  Falls in the past year? 0 0 0 0 Yes  Number falls in past yr: 0 0 - 0 -  Injury with Fall? - 0 - 0 -  Follow up - - Falls evaluation completed - -    FALL RISK PREVENTION PERTAINING TO THE HOME:  Any stairs in or around the home? {YES/NO:21197} If so, are there any without handrails? {YES/NO:21197} Home free of loose throw rugs in walkways, pet beds, electrical cords, etc? Yes  Adequate lighting in your home to reduce risk of falls?  Yes   ASSISTIVE DEVICES UTILIZED TO PREVENT FALLS:  Life alert? {YES/NO:21197} Use of a cane, walker or w/c? {YES/NO:21197} Grab bars in the bathroom? {YES/NO:21197} Shower chair or bench in shower? {YES/NO:21197} Elevated toilet seat or a handicapped toilet? {YES/NO:21197}  TIMED UP AND GO:  Was the test performed? {YES/NO:21197}.  Length of time to ambulate 10 feet: *** sec.   {Appearance of HGDJ:2426834}  Cognitive Function: Normal cognitive status assessed by direct observation by this Nurse Health Advisor. No abnormalities found.         Immunizations Immunization History  Administered Date(s) Administered  . Fluad Quad(high Dose 65+) 08/26/2019  . Influenza,inj,Quad PF,6+ Mos 08/29/2017  . Influenza-Unspecified 06/22/2020  . Moderna Sars-Covid-2 Vaccination 11/28/2019, 12/26/2019, 09/25/2020    TDAP status: Due, Education has been provided regarding the importance of this vaccine. Advised may receive this vaccine at local pharmacy or Health Dept. Aware to provide a copy of the vaccination record if obtained from local pharmacy or Health Dept. Verbalized acceptance and understanding.  Flu Vaccine status: Up to date  Pneumococcal vaccine status: Completed during today's visit.  Covid-19 vaccine status: Completed vaccines  Qualifies for Shingles Vaccine? Yes   Zostavax completed No   Shingrix Completed?: No.    Education has been provided regarding the importance of this vaccine. Patient has been advised to call insurance company to determine out of pocket expense if they have not yet received this vaccine. Advised may also receive vaccine at local pharmacy or Health Dept. Verbalized acceptance and understanding.  Screening Tests Health Maintenance  Topic Date Due  . Hepatitis C Screening  Never done  . COLONOSCOPY (Pts 45-78yrs Insurance coverage will need to be confirmed)  Never done  . TETANUS/TDAP  05/17/2015  . COVID-19 Vaccine (4 - Booster for Moderna  series) 03/26/2021  . INFLUENZA VACCINE  05/24/2021  . PNA vac Low Risk Adult  Completed  . HPV VACCINES  Aged Out  Health Maintenance  Health Maintenance Due  Topic Date Due  . Hepatitis C Screening  Never done  . COLONOSCOPY (Pts 45-18yrs Insurance coverage will need to be confirmed)  Never done  . TETANUS/TDAP  05/17/2015    Colorectal cancer screening: Referral to GI placed 01/28/2021. Pt aware the office will call re: appt.  Lung Cancer Screening: (Low Dose CT Chest recommended if Age 61-80 years, 30 pack-year currently smoking OR have quit w/in 15years.) does not qualify.   Lung Cancer Screening Referral: n/a  Additional Screening:  Hepatitis C Screening: does qualify  Vision Screening: Recommended annual ophthalmology exams for early detection of glaucoma and other disorders of the eye. Is the patient up to date with their annual eye exam?  {YES/NO:21197} Who is the provider or what is the name of the office in which the patient attends annual eye exams? *** If pt is not established with a provider, would they like to be referred to a provider to establish care? {YES/NO:21197}.   Dental Screening: Recommended annual dental exams for proper oral hygiene  Community Resource Referral / Chronic Care Management: CRR required this visit?  No   CCM required this visit?  No      Plan:     I have personally reviewed and noted the following in the patient's chart:   . Medical and social history . Use of alcohol, tobacco or illicit drugs  . Current medications and supplements . Functional ability and status . Nutritional status . Physical activity . Advanced directives . List of other physicians . Hospitalizations, surgeries, and ER visits in previous 12 months . Vitals . Screenings to include cognitive, depression, and falls . Referrals and appointments  In addition, I have reviewed and discussed with patient certain preventive protocols, quality metrics, and  best practice recommendations. A written personalized care plan for preventive services as well as general preventive health recommendations were provided to patient.     Randel Pigg, LPN   12/25/3543   Nurse Notes: none

## 2021-01-28 ENCOUNTER — Ambulatory Visit: Payer: PPO

## 2021-01-28 DIAGNOSIS — Z1211 Encounter for screening for malignant neoplasm of colon: Secondary | ICD-10-CM

## 2021-01-28 DIAGNOSIS — Z0000 Encounter for general adult medical examination without abnormal findings: Secondary | ICD-10-CM

## 2021-02-11 DIAGNOSIS — M272 Inflammatory conditions of jaws: Secondary | ICD-10-CM | POA: Diagnosis not present

## 2021-02-11 DIAGNOSIS — Y842 Radiological procedure and radiotherapy as the cause of abnormal reaction of the patient, or of later complication, without mention of misadventure at the time of the procedure: Secondary | ICD-10-CM | POA: Diagnosis not present

## 2021-02-17 ENCOUNTER — Other Ambulatory Visit: Payer: Self-pay | Admitting: Hematology and Oncology

## 2021-02-17 ENCOUNTER — Other Ambulatory Visit: Payer: Self-pay

## 2021-02-17 ENCOUNTER — Inpatient Hospital Stay: Payer: PPO | Attending: Oncology

## 2021-02-17 DIAGNOSIS — C61 Malignant neoplasm of prostate: Secondary | ICD-10-CM | POA: Insufficient documentation

## 2021-02-17 DIAGNOSIS — D649 Anemia, unspecified: Secondary | ICD-10-CM | POA: Diagnosis not present

## 2021-02-17 LAB — BASIC METABOLIC PANEL
BUN: 20 (ref 4–21)
CO2: 27 — AB (ref 13–22)
Chloride: 93 — AB (ref 99–108)
Creatinine: 0.9 (ref 0.6–1.3)
Glucose: 104
Potassium: 4.9 (ref 3.4–5.3)
Sodium: 125 — AB (ref 137–147)

## 2021-02-17 LAB — HEPATIC FUNCTION PANEL
ALT: 20 (ref 10–40)
AST: 31 (ref 14–40)
Alkaline Phosphatase: 102 (ref 25–125)
Bilirubin, Total: 0.7

## 2021-02-17 LAB — CBC AND DIFFERENTIAL
HCT: 39 — AB (ref 41–53)
Hemoglobin: 13.1 — AB (ref 13.5–17.5)
Neutrophils Absolute: 5.47
Platelets: 213 (ref 150–399)
WBC: 7.7

## 2021-02-17 LAB — COMPREHENSIVE METABOLIC PANEL
Albumin: 4.3 (ref 3.5–5.0)
Calcium: 8.6 — AB (ref 8.7–10.7)

## 2021-02-17 LAB — CBC: RBC: 4.25 (ref 3.87–5.11)

## 2021-02-17 NOTE — Progress Notes (Deleted)
[facility]  [address] [place],  [place]  [address] [phone]  Clinic Day:  11/20/2020  Referring physician: Dorothyann Peng, NP   HISTORY OF PRESENT ILLNESS:  The patient is a 75 y.o. 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  The patient has been off complete androgen blockade, which consisted of Lupron/Casodex, as it has negatively affected his daily quality of life in the past.  Despite his PSA becoming detectable again, he has elected to remain of complete androgen blockade therapy.  He comes in to reassess the status of his disease.  Since his last visit, the patient has been doing okay.  He also denies having any bone pain or other symptoms which concern him for overt signs of disease progression.    PHYSICAL EXAM:  There were no vitals taken for this visit. Wt Readings from Last 3 Encounters:  01/15/21 201 lb (91.2 kg)  12/01/20 199 lb (90.3 kg)  11/20/20 199 lb 6.4 oz (90.4 kg)   There is no height or weight on file to calculate BMI. Performance status (ECOG): 0 Physical Exam Constitutional:      Appearance: Normal appearance. He is not ill-appearing.  HENT:     Mouth/Throat:     Mouth: Mucous membranes are moist.     Pharynx: Oropharynx is clear. No oropharyngeal exudate or posterior oropharyngeal erythema.  Cardiovascular:     Rate and Rhythm: Normal rate and regular rhythm.     Heart sounds: No murmur heard. No friction rub. No gallop.   Pulmonary:     Effort: Pulmonary effort is normal. No respiratory distress.     Breath sounds: Normal breath sounds. No wheezing, rhonchi or rales.  Chest:  Breasts:     Right: No axillary adenopathy or supraclavicular adenopathy.     Left: No axillary adenopathy or supraclavicular adenopathy.    Abdominal:     General: Bowel sounds are normal. There is no distension.     Palpations: Abdomen is soft. There is no mass.     Tenderness: There is no  abdominal tenderness.  Musculoskeletal:        General: No swelling.     Right lower leg: No edema.     Left lower leg: No edema.  Lymphadenopathy:     Cervical: No cervical adenopathy.     Upper Body:     Right upper body: No supraclavicular or axillary adenopathy.     Left upper body: No supraclavicular or axillary adenopathy.     Lower Body: No right inguinal adenopathy. No left inguinal adenopathy.  Skin:    General: Skin is warm.     Coloration: Skin is not jaundiced.     Findings: No lesion or rash.  Neurological:     General: No focal deficit present.     Mental Status: He is alert and oriented to person, place, and time. Mental status is at baseline.     Cranial Nerves: Cranial nerves are intact.  Psychiatric:        Mood and Affect: Mood normal.        Behavior: Behavior normal.        Thought Content: Thought content normal.     LABS:    Ref. Range 11/19/2020 12:24  Prostate Specific Ag, Serum Latest Ref Range: 0.0 - 4.0 ng/mL 0.3      ASSESSMENT & PLAN:  Assessment/Plan:  A 75 y.o. male with metastatic prostate cancer, which includes  spread of disease to his bones.  Once again, his PSA has gone back to being detectable at 0.3.  Nevertheless, the patient remains asymptomatic.  As his quality of life remains excellent off complete androgen blockade, he wishes to stay off of it, for which I will oblige.  I will see him back in another 3 months for repeat clinical assessment. The patient understands all the plans discussed today and is in agreement with them.    Layanna Charo Macarthur Critchley, MD

## 2021-02-18 ENCOUNTER — Telehealth: Payer: Self-pay | Admitting: Oncology

## 2021-02-18 ENCOUNTER — Inpatient Hospital Stay: Payer: PPO | Admitting: Oncology

## 2021-02-18 LAB — PROSTATE-SPECIFIC AG, SERUM (LABCORP): Prostate Specific Ag, Serum: 8.8 ng/mL — ABNORMAL HIGH (ref 0.0–4.0)

## 2021-02-18 NOTE — Telephone Encounter (Signed)
Patient rescheduled to Tuesday, May 3 at 1:45 pm (patient was late for his Appt today)

## 2021-02-21 ENCOUNTER — Other Ambulatory Visit: Payer: Self-pay | Admitting: Adult Health

## 2021-02-21 DIAGNOSIS — Z951 Presence of aortocoronary bypass graft: Secondary | ICD-10-CM

## 2021-02-22 DIAGNOSIS — R0981 Nasal congestion: Secondary | ICD-10-CM | POA: Diagnosis not present

## 2021-02-22 DIAGNOSIS — J0100 Acute maxillary sinusitis, unspecified: Secondary | ICD-10-CM | POA: Diagnosis not present

## 2021-02-22 NOTE — Progress Notes (Addendum)
[facility]  [address] [place],  [place]  [address] [phone]  Clinic Day:  02/23/2021  Referring physician: Dorothyann Peng, NP   HISTORY OF PRESENT ILLNESS:  The patient is a 75 y.o. 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  The patient has been off and on complete androgen blockade, consisting of Lupron/Casodex.  As his PSA was undetectable in late 2021, the patient elected to stop his treatments.   He comes in to reassess the status of his disease.  Since his last visit, the patient has been doing well.  He denies having any bone pain or other symptoms which concern him for overt signs of disease progression.    PHYSICAL EXAM:  There were no vitals taken for this visit. Wt Readings from Last 3 Encounters:  01/15/21 201 lb (91.2 kg)  12/01/20 199 lb (90.3 kg)  11/20/20 199 lb 6.4 oz (90.4 kg)   There is no height or weight on file to calculate BMI. Performance status (ECOG): 0 Physical Exam Constitutional:      Appearance: Normal appearance. He is not ill-appearing.  HENT:     Mouth/Throat:     Mouth: Mucous membranes are moist.     Pharynx: Oropharynx is clear. No oropharyngeal exudate or posterior oropharyngeal erythema.  Cardiovascular:     Rate and Rhythm: Normal rate and regular rhythm.     Heart sounds: No murmur heard. No friction rub. No gallop.   Pulmonary:     Effort: Pulmonary effort is normal. No respiratory distress.     Breath sounds: Normal breath sounds. No wheezing, rhonchi or rales.  Chest:  Breasts:     Right: No axillary adenopathy or supraclavicular adenopathy.     Left: No axillary adenopathy or supraclavicular adenopathy.    Abdominal:     General: Bowel sounds are normal. There is no distension.     Palpations: Abdomen is soft. There is no mass.     Tenderness: There is no abdominal tenderness.  Musculoskeletal:        General: No swelling.     Right lower leg:  No edema.     Left lower leg: No edema.  Lymphadenopathy:     Cervical: No cervical adenopathy.     Upper Body:     Right upper body: No supraclavicular or axillary adenopathy.     Left upper body: No supraclavicular or axillary adenopathy.     Lower Body: No right inguinal adenopathy. No left inguinal adenopathy.  Skin:    General: Skin is warm.     Coloration: Skin is not jaundiced.     Findings: No lesion or rash.  Neurological:     General: No focal deficit present.     Mental Status: He is alert and oriented to person, place, and time. Mental status is at baseline.     Cranial Nerves: Cranial nerves are intact.  Psychiatric:        Mood and Affect: Mood normal.        Behavior: Behavior normal.        Thought Content: Thought content normal.     LABS:        ASSESSMENT & PLAN:  Assessment/Plan:  A 75 y.o. 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  In clinic today, I showed this patient his most recent PSA level, which has risen precipitously in just 3+ months.  He understands I  am concerned this trajectory suggests his disease could be returning rapidly to where re-initiation of therapy is of utmost importance.  He voiced understanding of this.  Before I restart complete androgen blockade therapy, I will have him undergo CT scans of his chest/abdomen/pelvis and a bone scan next week to ascertain his current disease baseline.  has gone back to being detectable at 0.3.  Nevertheless, the patient remains asymptomatic.  I will see him back next week to go over these scan results and their implications.  The patient understands all the plans discussed today and is in agreement with them.    Kemond Amorin Macarthur Critchley, MD

## 2021-02-23 ENCOUNTER — Telehealth: Payer: Self-pay | Admitting: Oncology

## 2021-02-23 ENCOUNTER — Inpatient Hospital Stay: Payer: PPO | Attending: Oncology | Admitting: Oncology

## 2021-02-23 ENCOUNTER — Other Ambulatory Visit: Payer: Self-pay | Admitting: Oncology

## 2021-02-23 ENCOUNTER — Other Ambulatory Visit: Payer: Self-pay

## 2021-02-23 VITALS — BP 173/80 | HR 59 | Temp 98.8°F | Resp 16 | Ht 69.5 in | Wt 199.4 lb

## 2021-02-23 DIAGNOSIS — C61 Malignant neoplasm of prostate: Secondary | ICD-10-CM

## 2021-02-23 DIAGNOSIS — Z5111 Encounter for antineoplastic chemotherapy: Secondary | ICD-10-CM | POA: Insufficient documentation

## 2021-02-23 DIAGNOSIS — C7951 Secondary malignant neoplasm of bone: Secondary | ICD-10-CM | POA: Insufficient documentation

## 2021-02-23 NOTE — Telephone Encounter (Signed)
Per 5/3 LOS, patient scheduled for 5/9 CT Scans, NM bone Scan - 5/10 Follow Up.  Gave patient Appt Summary/Orders/Instructions

## 2021-03-01 DIAGNOSIS — Z85528 Personal history of other malignant neoplasm of kidney: Secondary | ICD-10-CM | POA: Diagnosis not present

## 2021-03-01 DIAGNOSIS — G35 Multiple sclerosis: Secondary | ICD-10-CM | POA: Diagnosis not present

## 2021-03-01 DIAGNOSIS — K4090 Unilateral inguinal hernia, without obstruction or gangrene, not specified as recurrent: Secondary | ICD-10-CM | POA: Diagnosis not present

## 2021-03-01 DIAGNOSIS — E278 Other specified disorders of adrenal gland: Secondary | ICD-10-CM | POA: Diagnosis not present

## 2021-03-01 DIAGNOSIS — Z8546 Personal history of malignant neoplasm of prostate: Secondary | ICD-10-CM | POA: Diagnosis not present

## 2021-03-01 DIAGNOSIS — M47816 Spondylosis without myelopathy or radiculopathy, lumbar region: Secondary | ICD-10-CM | POA: Diagnosis not present

## 2021-03-01 DIAGNOSIS — C642 Malignant neoplasm of left kidney, except renal pelvis: Secondary | ICD-10-CM | POA: Diagnosis not present

## 2021-03-01 DIAGNOSIS — C61 Malignant neoplasm of prostate: Secondary | ICD-10-CM | POA: Diagnosis not present

## 2021-03-01 DIAGNOSIS — C7951 Secondary malignant neoplasm of bone: Secondary | ICD-10-CM | POA: Diagnosis not present

## 2021-03-01 DIAGNOSIS — I700 Atherosclerosis of aorta: Secondary | ICD-10-CM | POA: Diagnosis not present

## 2021-03-01 NOTE — Progress Notes (Signed)
[facility]  [address] [place],  [place]  [address] [phone]  Clinic Day:  03/02/2021  Referring physician: Dorothyann Peng, NP  This document serves as a record of services personally performed by Marice Potter, MD. It was created on their behalf by Curry,Lauren E, a trained medical scribe. The creation of this record is based on the scribe's personal observations and the provider's statements to them.  HISTORY OF PRESENT ILLNESS:  The patient is a 75 y.o. 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  The patient has been off and on complete androgen blockade, consisting of Lupron/Casodex.  He comes in today to go over scans to ascertain his new disease baseline before reinitiating complete androgen blockade therapy.  Scans were done due to him having a precipitous rise in his PSA level.  Despite this rise, the patient continues to deny having any systemic symptoms which concern him for overt signs of disease progression.    PHYSICAL EXAM:  Blood pressure (!) 232/108, pulse 76, temperature 98.2 F (36.8 C), resp. rate 16, height 5' 9.5" (1.765 m), weight 196 lb 1.6 oz (89 kg), SpO2 96 %. Wt Readings from Last 3 Encounters:  03/02/21 196 lb 1.6 oz (89 kg)  02/23/21 199 lb 6.4 oz (90.4 kg)  01/15/21 201 lb (91.2 kg)   Body mass index is 28.54 kg/m. Performance status (ECOG): 0 Physical Exam Constitutional:      Appearance: Normal appearance. He is not ill-appearing.  HENT:     Mouth/Throat:     Mouth: Mucous membranes are moist.     Pharynx: Oropharynx is clear. No oropharyngeal exudate or posterior oropharyngeal erythema.  Cardiovascular:     Rate and Rhythm: Normal rate and regular rhythm.     Heart sounds: No murmur heard. No friction rub. No gallop.   Pulmonary:     Effort: Pulmonary effort is normal. No respiratory distress.     Breath sounds: Normal breath sounds. No wheezing, rhonchi or  rales.  Chest:  Breasts:     Right: No axillary adenopathy or supraclavicular adenopathy.     Left: No axillary adenopathy or supraclavicular adenopathy.    Abdominal:     General: Bowel sounds are normal. There is no distension.     Palpations: Abdomen is soft. There is no mass.     Tenderness: There is no abdominal tenderness.  Musculoskeletal:        General: No swelling.     Right lower leg: No edema.     Left lower leg: No edema.  Lymphadenopathy:     Cervical: No cervical adenopathy.     Upper Body:     Right upper body: No supraclavicular or axillary adenopathy.     Left upper body: No supraclavicular or axillary adenopathy.     Lower Body: No right inguinal adenopathy. No left inguinal adenopathy.  Skin:    General: Skin is warm.     Coloration: Skin is not jaundiced.     Findings: No lesion or rash.  Neurological:     General: No focal deficit present.     Mental Status: He is alert and oriented to person, place, and time. Mental status is at baseline.     Cranial Nerves: Cranial nerves are intact.  Psychiatric:        Mood and Affect: Mood normal.        Behavior: Behavior normal.  Thought Content: Thought content normal.     LABS:          STUDIES: EXAM: 03/01/2021 CT CHEST, ABDOMEN, AND PELVIS WITH CONTRAST  FINDINGS: CT CHEST FINDINGS  Cardiovascular: Aortic atherosclerosis. No thoracic aortic aneurysm. No central pulmonary embolus. Normal size heart. No significant pericardial effusion/thickening. 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  Mediastinum/Nodes: No discrete thyroid nodularity. No pathologically enlarged mediastinal, hilar or axillary lymph nodes. Trachea esophagus are grossly unremarkable.  Lungs/Pleura: No suspicious pulmonary nodules or masses. No pleural effusion. No pneumothorax.  Musculoskeletal: Decreased sclerosis associated with the T3 and T4 vertebral body  lesions. Unchanged tiny sclerotic focus in the right lateral fifth rib image 51/4. Postsurgical changes of median sternotomy. No new aggressive lytic or blastic lesions of bone.  CT ABDOMEN PELVIS FINDINGS  Hepatobiliary: No suspicious hepatic lesion. Gallbladder is grossly unremarkable. No biliary ductal dilation.  Pancreas: Unremarkable. No pancreatic ductal dilatation or surrounding inflammatory changes.  Spleen: Normal in size without focal abnormality.  Adrenals/Urinary Tract: Right adrenal glands unremarkable. Left adrenal gland not visualized possibly surgically absent.  Prior left nephrectomy. No suspicious enhancing soft tissue nodularity in the nephrectomy bed to suggest local recurrence.  Multifocal areas of cortical scarring in the right kidney. No hydronephrosis. No solid enhancing renal mass. No suspicious filling defect visualized within the opacified portions of the collecting system or proximal ureter on delayed imaging.  Urinary bladder is grossly unremarkable for degree of distension.  Stomach/Bowel: Radiopaque enteric contrast visualized to the level of the sigmoid colon. Stomach is grossly unremarkable. Normal positioning of the duodenum/ligament of Treitz. No suspicious small bowel wall thickening or dilation. Terminal ileum and appendix are within normal limits. Scattered colonic diverticulosis without findings of acute diverticulitis.  Vascular/Lymphatic: Aortic atherosclerosis without aneurysmal dilation.  No gastrohepatic or hepatoduodenal ligament lymphadenopathy. No retroperitoneal or mesenteric lymphadenopathy. No pelvic sidewall lymphadenopathy. No groin lymphadenopathy.  Reproductive: Fiducial markers in the prostate.  Other:  No pneumoperitoneum.  No abdominopelvic ascites.  Musculoskeletal: Small fat containing left inguinal hernia. Multilevel degenerative changes spine most notably at L2-L3 and L3-L5 with disc space narrowing and  endplate sclerosis. Multilevel facet hypertrophy. No acute osseous abnormality. No aggressive lytic or blastic lesion of bone.  IMPRESSION: 1. Decreased conspicuity and sclerosis associated with the T3-T4 lesions vertebral body metastases. Unchanged tiny sclerotic focus in the right lateral fifth rib. Recommend attention on follow-up imaging including same day nuclear medicine bone scan. No new aggressive lytic or blastic lesions of bone visualized. 2. No lymphadenopathy within the chest, abdomen, or pelvis. 3. Prior left nephrectomy. No evidence of local recurrence. 4. Aortic atherosclerosis.  Aortic Atherosclerosis (ICD10-I70.0). ----------------------------------------------------------------------------------------------  EXAM: 03/01/2021 NUCLEAR MEDICINE WHOLE BODY BONE SCAN  FINDINGS: Anterior and posterior whole body planar images are obtained. There is physiologic excretion of radiotracer in the right kidney and bladder. The left kidney is surgically absent.  Persistent increased radiotracer activity at approximately the T4 level, decreased in intensity since prior study. This corresponds to improving sclerosis identified on CT scan performed concurrently 03/01/2021.  Stable degenerative change within the mid lumbar spine at the L2-3 level. 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  No other abnormal radiotracer activity to suggest additional metastases. Specifically, no abnormality within the right lateral fifth rib to correspond to the subtle sclerosis on CT scan performed today.  IMPRESSION: 1. Persistent focal radiotracer activity at T4 consistent with known metastatic disease. Decreased intensity of activity on today's exam, corresponding to improved  sclerosis on corresponding CT. 2. No additional metastatic foci. 3. Stable degenerative changes of the lumbar spine. 4. Prior left nephrectomy.   ASSESSMENT & PLAN:  Assessment/Plan:  A 75  y.o. 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  In clinic today, I went over his bone scan and CT scans with him for which there appears to be no obvious evidence of disease progression.  Although bone lesion were seen, his bone scan looks better now than what it did last year.  Nevertheless, with the precipitous rise in his PSA in just a few months, re-initiation of complete androgen blockade is necessary.  He will be placed on Lupron/apalutamide. Lupron will be given every 3 months; apalutamide will be given at 240 mg daily.  His apalutamide pills will be mailed to him via the [facility].  I will see him back in 6 weeks for repeat clinical assessment.  The patient understands all the plans discussed today and is in agreement with them.     I, Rita Ohara, am acting as scribe for Marice Potter, MD    I have reviewed this report as typed by the medical scribe, and it is complete and accurate.  Dequincy Macarthur Critchley, MD

## 2021-03-02 ENCOUNTER — Other Ambulatory Visit: Payer: Self-pay

## 2021-03-02 ENCOUNTER — Ambulatory Visit: Payer: PPO | Admitting: Oncology

## 2021-03-02 ENCOUNTER — Inpatient Hospital Stay (INDEPENDENT_AMBULATORY_CARE_PROVIDER_SITE_OTHER): Payer: PPO | Admitting: Oncology

## 2021-03-02 ENCOUNTER — Other Ambulatory Visit: Payer: Self-pay | Admitting: Oncology

## 2021-03-02 VITALS — BP 232/108 | HR 76 | Temp 98.2°F | Resp 16 | Ht 69.5 in | Wt 196.1 lb

## 2021-03-02 DIAGNOSIS — C61 Malignant neoplasm of prostate: Secondary | ICD-10-CM

## 2021-03-03 ENCOUNTER — Telehealth: Payer: Self-pay | Admitting: Oncology

## 2021-03-03 NOTE — Telephone Encounter (Signed)
Per 5/10 LOS, patient scheduled for June Appt's.  Called patient with Appt's

## 2021-03-08 ENCOUNTER — Telehealth: Payer: Self-pay | Admitting: Oncology

## 2021-03-08 ENCOUNTER — Encounter: Payer: Self-pay | Admitting: Oncology

## 2021-03-08 NOTE — Telephone Encounter (Signed)
Patient scheduled for Lupron Injection 5/17 at 3:00 pm

## 2021-03-09 ENCOUNTER — Inpatient Hospital Stay: Payer: PPO

## 2021-03-09 ENCOUNTER — Other Ambulatory Visit: Payer: Self-pay

## 2021-03-09 VITALS — BP 157/75 | HR 63 | Temp 98.1°F | Resp 18 | Ht 69.5 in | Wt 197.0 lb

## 2021-03-09 DIAGNOSIS — C61 Malignant neoplasm of prostate: Secondary | ICD-10-CM | POA: Diagnosis not present

## 2021-03-09 DIAGNOSIS — Z5111 Encounter for antineoplastic chemotherapy: Secondary | ICD-10-CM | POA: Diagnosis not present

## 2021-03-09 DIAGNOSIS — C7951 Secondary malignant neoplasm of bone: Secondary | ICD-10-CM | POA: Diagnosis not present

## 2021-03-09 MED ORDER — LEUPROLIDE ACETATE (3 MONTH) 22.5 MG IM KIT
PACK | INTRAMUSCULAR | Status: AC
  Filled 2021-03-09: qty 22.5

## 2021-03-09 MED ORDER — LEUPROLIDE ACETATE (3 MONTH) 22.5 MG IM KIT
22.5000 mg | PACK | Freq: Once | INTRAMUSCULAR | Status: AC
  Administered 2021-03-09: 22.5 mg via INTRAMUSCULAR

## 2021-03-09 NOTE — Patient Instructions (Signed)
Leuprolide injection What is this medicine? LEUPROLIDE (loo PROE lide) is a man-made hormone. It is used to treat the symptoms of prostate cancer. This medicine may also be used to treat children with early onset of puberty. It may be used for other hormonal conditions. This medicine may be used for other purposes; ask your health care provider or pharmacist if you have questions. COMMON BRAND NAME(S): Lupron What should I tell my health care provider before I take this medicine? They need to know if you have any of these conditions:  diabetes  heart disease or previous heart attack  high blood pressure  high cholesterol  pain or difficulty passing urine  spinal cord metastasis  stroke  tobacco smoker  an unusual or allergic reaction to leuprolide, benzyl alcohol, other medicines, foods, dyes, or preservatives  pregnant or trying to get pregnant  breast-feeding How should I use this medicine? This medicine is for injection under the skin or into a muscle. You will be taught how to prepare and give this medicine. Use exactly as directed. Take your medicine at regular intervals. Do not take your medicine more often than directed. It is important that you put your used needles and syringes in a special sharps container. Do not put them in a trash can. If you do not have a sharps container, call your pharmacist or healthcare provider to get one. A special MedGuide will be given to you by the pharmacist with each prescription and refill. Be sure to read this information carefully each time. Talk to your pediatrician regarding the use of this medicine in children. While this medicine may be prescribed for children as young as 8 years for selected conditions, precautions do apply. Overdosage: If you think you have taken too much of this medicine contact a poison control center or emergency room at once. NOTE: This medicine is only for you. Do not share this medicine with others. What if  I miss a dose? If you miss a dose, take it as soon as you can. If it is almost time for your next dose, take only that dose. Do not take double or extra doses. What may interact with this medicine? Do not take this medicine with any of the following medications:  chasteberry  cisapride  dronedarone  pimozide  thioridazine This medicine may also interact with the following medications:  herbal or dietary supplements, like black cohosh or DHEA  male hormones, like estrogens or progestins and birth control pills, patches, rings, or injections  male hormones, like testosterone  other medicines that prolong the QT interval (abnormal heart rhythm) This list may not describe all possible interactions. Give your health care provider a list of all the medicines, herbs, non-prescription drugs, or dietary supplements you use. Also tell them if you smoke, drink alcohol, or use illegal drugs. Some items may interact with your medicine. What should I watch for while using this medicine? Visit your doctor or health care professional for regular checks on your progress. During the first week, your symptoms may get worse, but then will improve as you continue your treatment. You may get hot flashes, increased bone pain, increased difficulty passing urine, or an aggravation of nerve symptoms. Discuss these effects with your doctor or health care professional, some of them may improve with continued use of this medicine. Male patients may experience a menstrual cycle or spotting during the first 2 months of therapy with this medicine. If this continues, contact your doctor or health care professional.   This medicine may increase blood sugar. Ask your healthcare provider if changes in diet or medicines are needed if you have diabetes. What side effects may I notice from receiving this medicine? Side effects that you should report to your doctor or health care professional as soon as possible:  allergic  reactions like skin rash, itching or hives, swelling of the face, lips, or tongue  breathing problems  chest pain  depression or memory disorders  pain in your legs or groin  pain at site where injected  severe headache  signs and symptoms of high blood sugar such as being more thirsty or hungry or having to urinate more than normal. You may also feel very tired or have blurry vision  swelling of the feet and legs  visual changes  vomiting Side effects that usually do not require medical attention (report to your doctor or health care professional if they continue or are bothersome):  breast swelling or tenderness  decrease in sex drive or performance  diarrhea  hot flashes  loss of appetite  muscle, joint, or bone pains  nausea  redness or irritation at site where injected  skin problems or acne This list may not describe all possible side effects. Call your doctor for medical advice about side effects. You may report side effects to FDA at 1-800-FDA-1088. Where should I keep my medicine? Keep out of the reach of children. Store below 25 degrees C (77 degrees F). Do not freeze. Protect from light. Do not use if it is not clear or if there are particles present. Throw away any unused medicine after the expiration date. NOTE: This sheet is a summary. It may not cover all possible information. If you have questions about this medicine, talk to your doctor, pharmacist, or health care provider.  2021 [employer]vier/Gold Standard (2019-09-11 10:57:41)  

## 2021-03-10 ENCOUNTER — Encounter: Payer: Self-pay | Admitting: Hematology and Oncology

## 2021-03-10 ENCOUNTER — Inpatient Hospital Stay (INDEPENDENT_AMBULATORY_CARE_PROVIDER_SITE_OTHER): Payer: PPO | Admitting: Hematology and Oncology

## 2021-03-10 VITALS — BP 187/81 | HR 73 | Temp 98.5°F | Resp 18 | Ht 69.5 in | Wt 196.6 lb

## 2021-03-10 DIAGNOSIS — C61 Malignant neoplasm of prostate: Secondary | ICD-10-CM

## 2021-03-10 NOTE — Progress Notes (Signed)
Patient presented today for chemotherapy education on apalutamide and leuprolide. He stated prior to teaching that he is not sure he wants to move forward with treatment as he has a history of seizures and his research has led him to see that this can be increased with apalutamide. He will take the information provided home today, but requests that I discuss with Dr. Bobby Rumpf that he continue on leuprolide injections alone.

## 2021-03-11 ENCOUNTER — Encounter: Payer: Self-pay | Admitting: Oncology

## 2021-03-12 ENCOUNTER — Ambulatory Visit (INDEPENDENT_AMBULATORY_CARE_PROVIDER_SITE_OTHER): Payer: PPO

## 2021-03-12 ENCOUNTER — Other Ambulatory Visit: Payer: Self-pay

## 2021-03-12 ENCOUNTER — Other Ambulatory Visit (HOSPITAL_BASED_OUTPATIENT_CLINIC_OR_DEPARTMENT_OTHER): Payer: Self-pay

## 2021-03-12 VITALS — BP 138/68 | HR 80 | Temp 98.4°F | Ht 71.0 in | Wt 195.0 lb

## 2021-03-12 DIAGNOSIS — Z0000 Encounter for general adult medical examination without abnormal findings: Secondary | ICD-10-CM

## 2021-03-12 DIAGNOSIS — Z1211 Encounter for screening for malignant neoplasm of colon: Secondary | ICD-10-CM | POA: Diagnosis not present

## 2021-03-12 NOTE — Progress Notes (Signed)
Subjective:   Connor [name] is a 75 y.o. male who presents for an Initial Medicare Annual Wellness Visit.  Review of Systems    n/a       Objective:    There were no vitals filed for this visit. There is no height or weight on file to calculate BMI.  Advanced Directives 03/02/2021 02/23/2021 11/20/2020 03/22/2020 03/21/2020 11/17/2014 11/03/2014  Does Patient Have a Medical Advance Directive? Yes Yes Yes Yes Yes No No  Type of Paramedic of [place];Living will [place];Living will [place];Living will [place];Living will [place];Living will - -  Does patient want to make changes to medical advance directive? - - - No - Patient declined - - -  Would patient like information on creating a medical advance directive? - - - - - No - patient declined information No - patient declined information  Pre-existing out of facility DNR order (yellow form or pink MOST form) - - - - - - -    Current Medications (verified) Outpatient Encounter Medications as of 03/12/2021  Medication Sig  . aspirin EC 81 MG tablet Take 81 mg by mouth daily. Swallow whole.  . Bicalutamide (CASODEX PO) Take by mouth as directed.  . calcium-vitamin D (OSCAL WITH D) 500-200 MG-UNIT tablet Take 1 tablet by mouth 2 (two) times daily. 1500mg  / 200unit tablet  . carvedilol (COREG) 12.5 MG tablet TAKE 1 TABLET(12.5 MG) BY MOUTH TWICE DAILY WITH A MEAL  . chlorhexidine (PERIDEX) 0.12 % solution RINSE AND GARGLE 15 ML BY MOUTH OR THROAT TWICE DAILY  . Cyanocobalamin (B-12 PO) Take by mouth.  . lamoTRIgine (LAMICTAL) 100 MG tablet Take 1 tablet (100 mg total) by mouth daily AND 2 tablets (200 mg total) at bedtime.  . rosuvastatin (CRESTOR) 20 MG tablet Place 1 tablet (20 mg total) into feeding tube daily.  . valsartan (DIOVAN) 80 MG tablet Take 1 tablet (80 mg total) by mouth daily.   No facility-administered encounter  medications on file as of 03/12/2021.    Allergies (verified) Morphine and related   History: Past Medical History:  Diagnosis Date  . Arthritis    OA AND PAIN RT KNEE  . Cancer ([place])    h/o neck - ABOUT 6 YRS AGO - TX'D WITH RADIATION AND CHEMO   . Chronic kidney disease   . GERD without esophagitis 03/21/2020  . Head and neck cancer ~ 2009   S/P radiation & [name]  . Headache(784.0)   . Hyperlipidemia   . Hypertension   . Kidney carcinoma ([place])    h/o - NEPHRECTOMY   . Malignant neoplasm of prostate ([place])   . 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 Prostate cancer ([place]) 05/20/14   Gleason 4+3=7, volume 25 gm  . Radiation 2015   hx of, prostate cancer  . Seizures ([place])    hx of x yrs, "the bad kind;bite tongue; STATES LAST [place] 2013; WAS SEEING [name] - HE RETIRED AND PT LAST SAW DR. [name]  . Stroke ([place]) [place] 2013   UNABLE TO SPEAK OR MOVE AND RT SIDE WEAKNESS AND LOSS OF SKIN SENSITIVITY TO HEAT AND COLD ON RT SIDE, DOUBLE VISION. BALANCE PROBLEMS---STATES STILL HAS DOUBLE VISION AND BALANCE PROBLEM AND RT SIDED LOSS OF SKIN SENSITIVITY   Past Surgical History:  Procedure Laterality Date  . CORONARY  ARTERY BYPASS GRAFT N/A 03/27/2020   Procedure: CORONARY ARTERY BYPASS GRAFTING (CABG) x Three, using left internal mammary artery and right leg greater saphenous vein harvested endoscopically;  Surgeon: Ivin Poot, MD;  Location: [facility];  Service: Open Heart Surgery;  Laterality: N/A;  . hydrocelectomy  11/2000   left  . IR FLUORO GUIDE CV LINE RIGHT  08/31/2017  . IR GASTROSTOMY TUBE MOD SED  04/07/2020  . IR GASTROSTOMY TUBE REMOVAL  07/20/2020  . IR US GUIDE VASC ACCESS RIGHT  08/31/2017  . JOINT REPLACEMENT     LEFT TOTAL KNEE ARTHROPLASTY  . MOUTH SURGERY  2019  . NEPHRECTOMY  1990's   left  . NEPHRECTOMY    . PROSTATE BIOPSY  05/20/14   Gleason 4+3=7, vol 25 gm  . RIGHT/LEFT HEART CATH AND  CORONARY ANGIOGRAPHY N/A 03/24/2020   Procedure: RIGHT/LEFT HEART CATH AND CORONARY ANGIOGRAPHY;  Surgeon: Burnell Blanks, MD;  Location: [facility];  Service: Cardiovascular;  Laterality: N/A;  . TEE WITHOUT CARDIOVERSION  10/04/2011   Procedure: TRANSESOPHAGEAL ECHOCARDIOGRAM (TEE);  Surgeon: Candee Furbish, MD;  Location: [facility] ENDOSCOPY;  Service: Cardiovascular;  Laterality: N/A;  . TEE WITHOUT CARDIOVERSION N/A 03/27/2020   Procedure: TRANSESOPHAGEAL ECHOCARDIOGRAM (TEE);  Surgeon: Prescott Gum, Collier Salina, MD;  Location: [facility];  Service: Open Heart Surgery;  Laterality: N/A;  . TOTAL KNEE ARTHROPLASTY  2011   left  . TOTAL KNEE ARTHROPLASTY Right 02/24/2014   Procedure: RIGHT TOTAL KNEE ARTHROPLASTY;  Surgeon: Gearlean Alf, MD;  Location: [facility];  Service: Orthopedics;  Laterality: Right;   Family History  Problem Relation Age of Onset  . Diabetes Mother   . Heart disease Mother   . Heart disease Father    Social History   Socioeconomic History  . Marital status: Divorced    Spouse name: Not on file  . Number of children: 2  . Years of education: 12th  . Highest education level: Not on file  Occupational History  . Occupation: Information systems manager: OTHER    Comment: [employer]  Tobacco Use  . Smoking status: Never Smoker  . Smokeless tobacco: Never Used  Vaping Use  . Vaping Use: Never used  Substance and Sexual Activity  . Alcohol use: Yes    Alcohol/week: 3.0 standard drinks    Types: 3 Cans of beer per week    Comment: 3 beers a day   . Drug use: No  . Sexual activity: Not Currently  Other Topics Concern  . Not on file  Social History Narrative   Patient is divorced with 2 children.   Patient is right handed.   Patient has hs education.   Patient drinks 2 cups daily.   Social Determinants of Health   Financial Resource Strain: Not on file  Food Insecurity: Not on file  Transportation Needs: Not on file  Physical Activity: Not on file  Stress:  Not on file  Social Connections: Not on file    Tobacco Counseling Counseling given: Not Answered   Clinical Intake:                 Diabetic?no         Activities of Daily Living In your present state of health, do you have any difficulty performing the following activities: 03/22/2020  Hearing? N  Comment Patient has hearing aids  Vision? N  Difficulty concentrating or making decisions? N  Walking or climbing stairs? Y  Dressing  or bathing? N  Doing errands, shopping? N  Some recent data might be hidden    Patient Care Team: Dorothyann Peng, NP as PCP - General (Family Medicine) Debara Pickett Nadean Corwin, MD as PCP - Cardiology (Cardiology) Marice Potter, MD as Consulting Physician (Oncology) Tamela Oddi, DDS as Consulting Physician (Oral Surgery) Carlyle Basques, MD as Consulting Physician (Infectious Diseases) Sherwood Gambler, DDS (Dentistry) [name] Earlean Polka, MD as Consulting Physician (Neurology) Henderson Lobo, MD as Consulting Physician (Gastroenterology)  Indicate any recent Medical Services you may have received from other than [facility] providers in the past year (date may be approximate).     Assessment:   This is a routine wellness examination for [place].  Hearing/Vision screen No exam data present  Dietary issues and exercise activities discussed:    Goals Addressed   None    Depression Screen PHQ 2/9 Scores 09/29/2020 04/13/2020 08/26/2019 07/22/2019 02/05/2019 08/07/2018 05/03/2018  PHQ - 2 Score 0 0 0 0 0 0 0    Fall Risk Fall Risk  09/29/2020 04/13/2020 08/26/2019 07/22/2019 05/03/2018  Falls in the past year? 0 0 0 0 Yes  Number falls in past yr: 0 0 - 0 -  Injury with Fall? - 0 - 0 -  Follow up - - Falls evaluation completed - -    FALL RISK PREVENTION PERTAINING TO THE HOME:  Any stairs in or around the home? No  If so, are there any without handrails? No  Home free of loose throw rugs in walkways, pet beds, electrical cords,  etc? Yes  Adequate lighting in your home to reduce risk of falls? Yes   ASSISTIVE DEVICES UTILIZED TO PREVENT FALLS:  Life alert? No  Use of a cane, walker or w/c? Yes  Grab bars in the bathroom? Yes  Shower chair or bench in shower? Yes  Elevated toilet seat or a handicapped toilet? Yes     Cognitive Function:     Normal cognitive status assessed by direct observation by this Nurse Health Advisor. No abnormalities found.      Immunizations Immunization History  Administered Date(s) Administered  . Fluad Quad(high Dose 65+) 08/26/2019  . Influenza,inj,Quad PF,6+ Mos 08/29/2017  . Influenza-Unspecified 06/22/2020  . Moderna Sars-Covid-2 Vaccination 11/28/2019, 12/26/2019, 09/25/2020    TDAP status: Up to date  Flu Vaccine status: Up to date  Pneumococcal vaccine status: Up to date  Covid-19 vaccine status: Completed vaccines  Qualifies for Shingles Vaccine? Yes   Zostavax completed No   Shingrix Completed?: No.    Education has been provided regarding the importance of this vaccine. Patient has been advised to call insurance company to determine out of pocket expense if they have not yet received this vaccine. Advised may also receive vaccine at local pharmacy or Health Dept. Verbalized acceptance and understanding.  Screening Tests Health Maintenance  Topic Date Due  . Hepatitis C Screening  Never done  . COLONOSCOPY (Pts 45-41yrs Insurance coverage will need to be confirmed)  Never done  . TETANUS/TDAP  05/17/2015  . COVID-19 Vaccine (4 - Booster for Moderna series) 12/24/2020  . INFLUENZA VACCINE  05/24/2021  . PNA vac Low Risk Adult  Completed  . HPV VACCINES  Aged Out    Health Maintenance  Health Maintenance Due  Topic Date Due  . Hepatitis C Screening  Never done  . COLONOSCOPY (Pts 45-1yrs Insurance coverage will need to be confirmed)  Never done  . TETANUS/TDAP  05/17/2015  . COVID-19 Vaccine (4 - Booster for [employer]  series) 12/24/2020     Colorectal cancer screening: Referral to GI placed 03/12/2021. Pt aware the office will call re: appt.  Lung Cancer Screening: (Low Dose CT Chest recommended if Age 53-80 years, 30 pack-year currently smoking OR have quit w/in 15years.) does not qualify.   Lung Cancer Screening Referral: n/a  Additional Screening:  Hepatitis C Screening: does not qualify  Vision Screening: Recommended annual ophthalmology exams for early detection of glaucoma and other disorders of the eye. Is the patient up to date with their annual eye exam?  Yes  Who is the provider or what is the name of the office in which the patient attends annual eye exams? [facility] If pt is not established with a provider, would they like to be referred to a provider to establish care? No .   Dental Screening: Recommended annual dental exams for proper oral hygiene  Community Resource Referral / Chronic Care Management: CRR required this visit?  No   CCM required this visit?  No      Plan:     I have personally reviewed and noted the following in the patient's chart:   . Medical and social history . Use of alcohol, tobacco or illicit drugs  . Current medications and supplements including opioid prescriptions. Patient is not currently taking opioid prescriptions. . Functional ability and status . Nutritional status . Physical activity . Advanced directives . List of other physicians . Hospitalizations, surgeries, and ER visits in previous 12 months . Vitals . Screenings to include cognitive, depression, and falls . Referrals and appointments  In addition, I have reviewed and discussed with patient certain preventive protocols, quality metrics, and best practice recommendations. A written personalized care plan for preventive services as well as general preventive health recommendations were provided to patient.     March Rummage, LPN   [id number]   Nurse Notes: none

## 2021-03-12 NOTE — Patient Instructions (Signed)
Connor [name] , Thank you for taking time to come for your Medicare Wellness Visit. I appreciate your ongoing commitment to your health goals. Please review the following plan we discussed and let me know if I can assist you in the future.   Screening recommendations/referrals: Colonoscopy: referral 03/12/2021 Recommended yearly ophthalmology/optometry visit for glaucoma screening and checkup Recommended yearly dental visit for hygiene and checkup  Vaccinations: Influenza vaccine: current due fall 2022 Pneumococcal vaccine: completed series  Tdap vaccine: obtain upon injury  Shingles vaccine: will obtain  local pharmacy   Advanced directives: will provide copies   Conditions/risks identified: none   Next appointment: none   Preventive Care 6 Years and Older, Male Preventive care refers to lifestyle choices and visits with your health care provider that can promote health and wellness. What does preventive care include?  A yearly physical exam. This is also called an annual well check.  Dental exams once or twice a year.  Routine eye exams. Ask your health care provider how often you should have your eyes checked.  Personal lifestyle choices, including:  Daily care of your teeth and gums.  Regular physical activity.  Eating a healthy diet.  Avoiding tobacco and drug use.  Limiting alcohol use.  Practicing safe sex.  Taking low doses of aspirin every day.  Taking vitamin and mineral supplements as recommended by your health care provider. What happens during an annual well check? The services and screenings done by your health care provider during your annual well check will depend on your age, overall health, lifestyle risk factors, and family history of disease. Counseling  Your health care provider may ask you questions about your:  Alcohol use.  Tobacco use.  Drug use.  Emotional well-being.  Home and relationship well-being.  Sexual activity.  Eating  habits.  History of falls.  Memory and ability to understand (cognition).  Work and work Statistician. Screening  You may have the following tests or measurements:  Height, weight, and BMI.  Blood pressure.  Lipid and cholesterol levels. These may be checked every 5 years, or more frequently if you are over 75 years old.  Skin check.  Lung cancer screening. You may have this screening every year starting at age 43 if you have a 30-pack-year history of smoking and currently smoke or have quit within the past 15 years.  Fecal occult blood test (FOBT) of the stool. You may have this test every year starting at age 87.  Flexible sigmoidoscopy or colonoscopy. You may have a sigmoidoscopy every 5 years or a colonoscopy every 10 years starting at age 59.  Prostate cancer screening. Recommendations will vary depending on your family history and other risks.  Hepatitis C blood test.  Hepatitis B blood test.  Sexually transmitted disease (STD) testing.  Diabetes screening. This is done by checking your blood sugar (glucose) after you have not eaten for a while (fasting). You may have this done every 1-3 years.  Abdominal aortic aneurysm (AAA) screening. You may need this if you are a current or former smoker.  Osteoporosis. You may be screened starting at age 22 if you are at high risk. Talk with your health care provider about your test results, treatment options, and if necessary, the need for more tests. Vaccines  Your health care provider may recommend certain vaccines, such as:  Influenza vaccine. This is recommended every year.  Tetanus, diphtheria, and acellular pertussis (Tdap, Td) vaccine. You may need a Td booster every 10 years.  Zoster vaccine. You may need this after age 61.  Pneumococcal 13-valent conjugate (PCV13) vaccine. One dose is recommended after age 41.  Pneumococcal polysaccharide (PPSV23) vaccine. One dose is recommended after age 64. Talk to your health  care provider about which screenings and vaccines you need and how often you need them. This information is not intended to replace advice given to you by your health care provider. Make sure you discuss any questions you have with your health care provider. Document Released: 11/06/2015 Document Revised: 06/29/2016 Document Reviewed: 08/11/2015 Elsevier Interactive Patient Education  2017 [place] Prevention in the Home Falls can cause injuries. They can happen to people of all ages. There are many things you can do to make your home safe and to help prevent falls. What can I do on the outside of my home?  Regularly fix the edges of walkways and driveways and fix any cracks.  Remove anything that might make you trip as you walk through a door, such as a raised step or threshold.  Trim any bushes or trees on the path to your home.  Use bright outdoor lighting.  Clear any walking paths of anything that might make someone trip, such as rocks or tools.  Regularly check to see if handrails are loose or broken. Make sure that both sides of any steps have handrails.  Any raised decks and porches should have guardrails on the edges.  Have any leaves, snow, or ice cleared regularly.  Use sand or salt on walking paths during winter.  Clean up any spills in your garage right away. This includes oil or grease spills. What can I do in the bathroom?  Use night lights.  Install grab bars by the toilet and in the tub and shower. Do not use towel bars as grab bars.  Use non-skid mats or decals in the tub or shower.  If you need to sit down in the shower, use a plastic, non-slip stool.  Keep the floor dry. Clean up any water that spills on the floor as soon as it happens.  Remove soap buildup in the tub or shower regularly.  Attach bath mats securely with double-sided non-slip rug tape.  Do not have throw rugs and other things on the floor that can make you trip. What can I do  in the bedroom?  Use night lights.  Make sure that you have a light by your bed that is easy to reach.  Do not use any sheets or blankets that are too big for your bed. They should not hang down onto the floor.  Have a firm chair that has side arms. You can use this for support while you get dressed.  Do not have throw rugs and other things on the floor that can make you trip. What can I do in the kitchen?  Clean up any spills right away.  Avoid walking on wet floors.  Keep items that you use a lot in easy-to-reach places.  If you need to reach something above you, use a strong step stool that has a grab bar.  Keep electrical cords out of the way.  Do not use floor polish or wax that makes floors slippery. If you must use wax, use non-skid floor wax.  Do not have throw rugs and other things on the floor that can make you trip. What can I do with my stairs?  Do not leave any items on the stairs.  Make sure that there are  handrails on both sides of the stairs and use them. Fix handrails that are broken or loose. Make sure that handrails are as long as the stairways.  Check any carpeting to make sure that it is firmly attached to the stairs. Fix any carpet that is loose or worn.  Avoid having throw rugs at the top or bottom of the stairs. If you do have throw rugs, attach them to the floor with carpet tape.  Make sure that you have a light switch at the top of the stairs and the bottom of the stairs. If you do not have them, ask someone to add them for you. What else can I do to help prevent falls?  Wear shoes that:  Do not have high heels.  Have rubber bottoms.  Are comfortable and fit you well.  Are closed at the toe. Do not wear sandals.  If you use a stepladder:  Make sure that it is fully opened. Do not climb a closed stepladder.  Make sure that both sides of the stepladder are locked into place.  Ask someone to hold it for you, if possible.  Clearly mark  and make sure that you can see:  Any grab bars or handrails.  First and last steps.  Where the edge of each step is.  Use tools that help you move around (mobility aids) if they are needed. These include:  Canes.  Walkers.  Scooters.  Crutches.  Turn on the lights when you go into a dark area. Replace any light bulbs as soon as they burn out.  Set up your furniture so you have a clear path. Avoid moving your furniture around.  If any of your floors are uneven, fix them.  If there are any pets around you, be aware of where they are.  Review your medicines with your doctor. Some medicines can make you feel dizzy. This can increase your chance of falling. Ask your doctor what other things that you can do to help prevent falls. This information is not intended to replace advice given to you by your health care provider. Make sure you discuss any questions you have with your health care provider. Document Released: 08/06/2009 Document Revised: 03/17/2016 Document Reviewed: 11/14/2014 Elsevier Interactive Patient Education  2017 [employer].

## 2021-03-12 NOTE — Progress Notes (Signed)
Spoke with [employer] patient has a co-pay of $3,015 for his Erleada. I called patient and let him know that there are no open co-pay funds but we could apply for free medication. He does not wish to apply for free medication at this time, he is unsure he wants to take medication. I told him to call me if anything changes. Let Melissa, NP, know and that he is asking her to call him to discuss further.

## 2021-03-14 IMAGING — DX DG CHEST 1V PORT
1 series · 1 of 1 positions shown · non-contrast
Comparison: 03/29/2020

CLINICAL DATA: CABG

EXAM:
PORTABLE CHEST 1 VIEW

[chest]
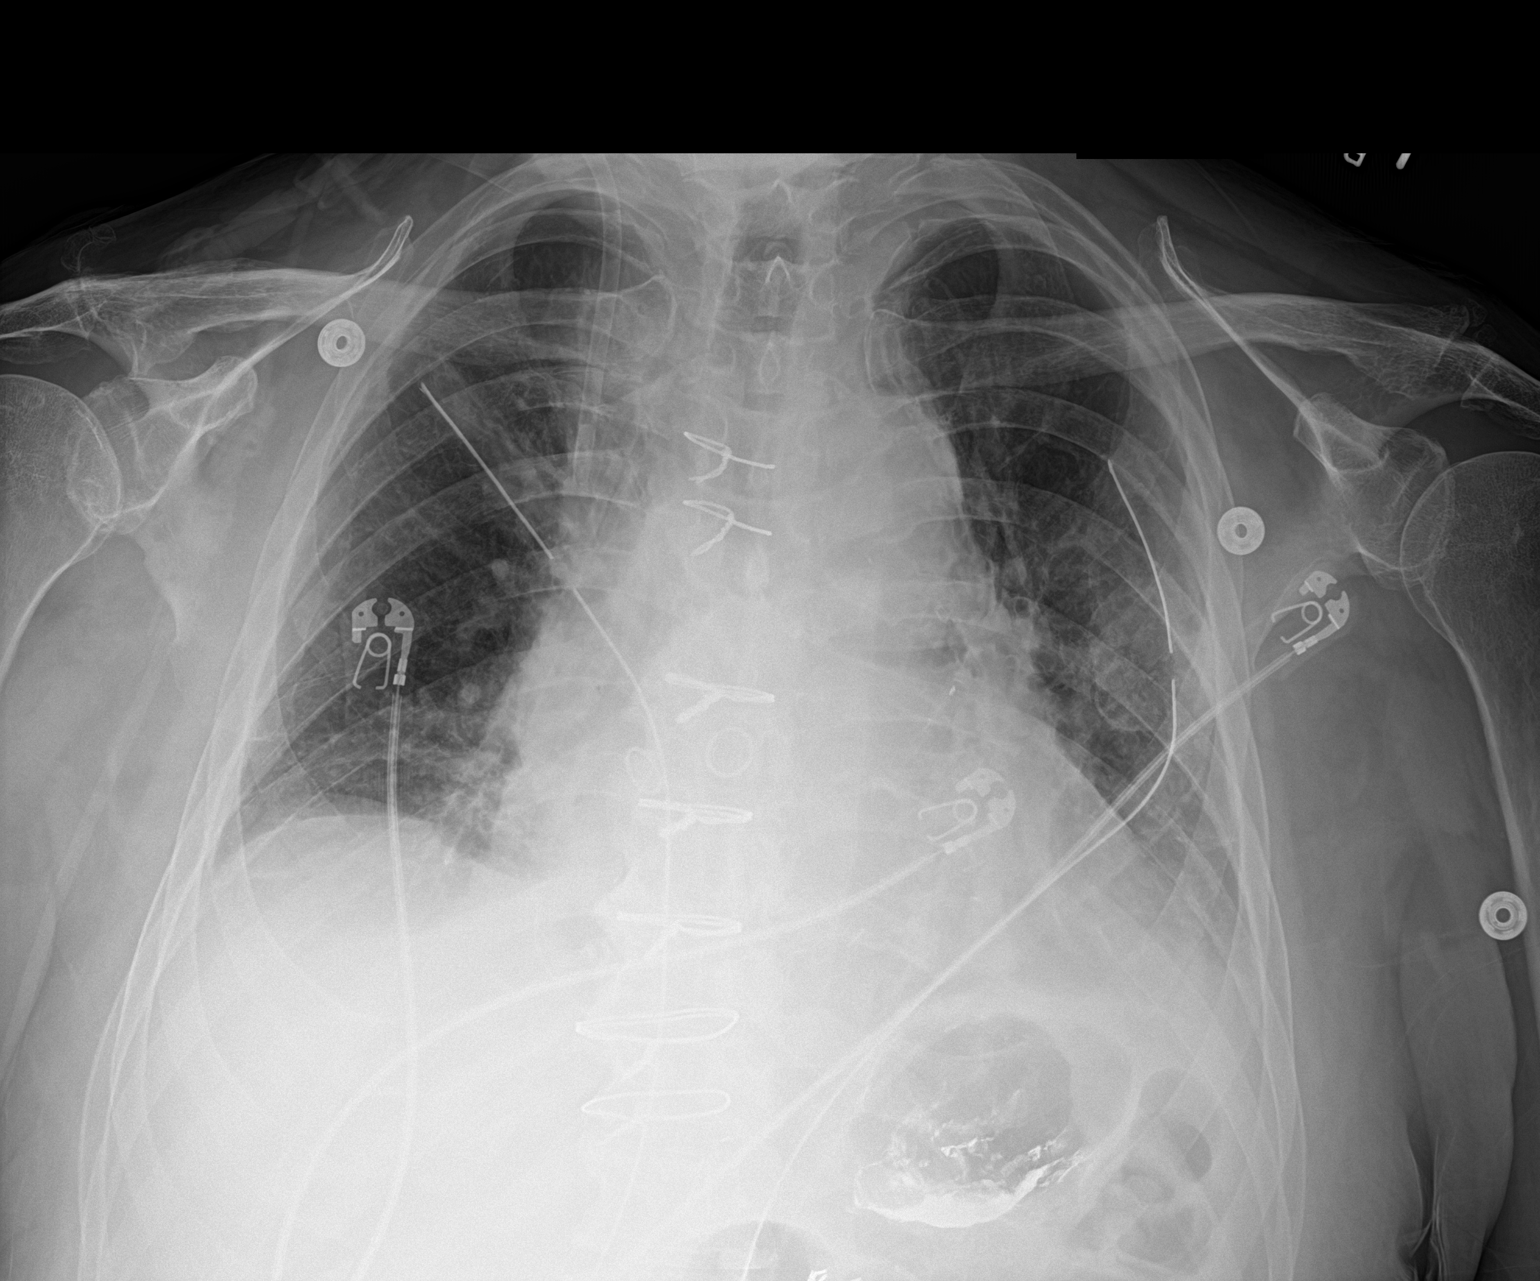

[1 of 1 positions shown; findings below may reference images not displayed]

FINDINGS: Bilateral chest tubes remain in place. Small left apical
pneumothorax. No pneumothorax on the right. Cardiomegaly. Prior
CABG. Bibasilar atelectasis. Suspect small left effusion.
IMPRESSION: Bilateral chest tubes.  Small left apical pneumothorax.

Left base atelectasis with probable small effusion.

## 2021-03-15 IMAGING — DX DG CHEST 1V PORT
1 series · 1 of 1 positions shown · non-contrast
Comparison: Chest radiograph from one day prior.

CLINICAL DATA: Post CABG, dyspnea

EXAM:
PORTABLE CHEST 1 VIEW

[chest]
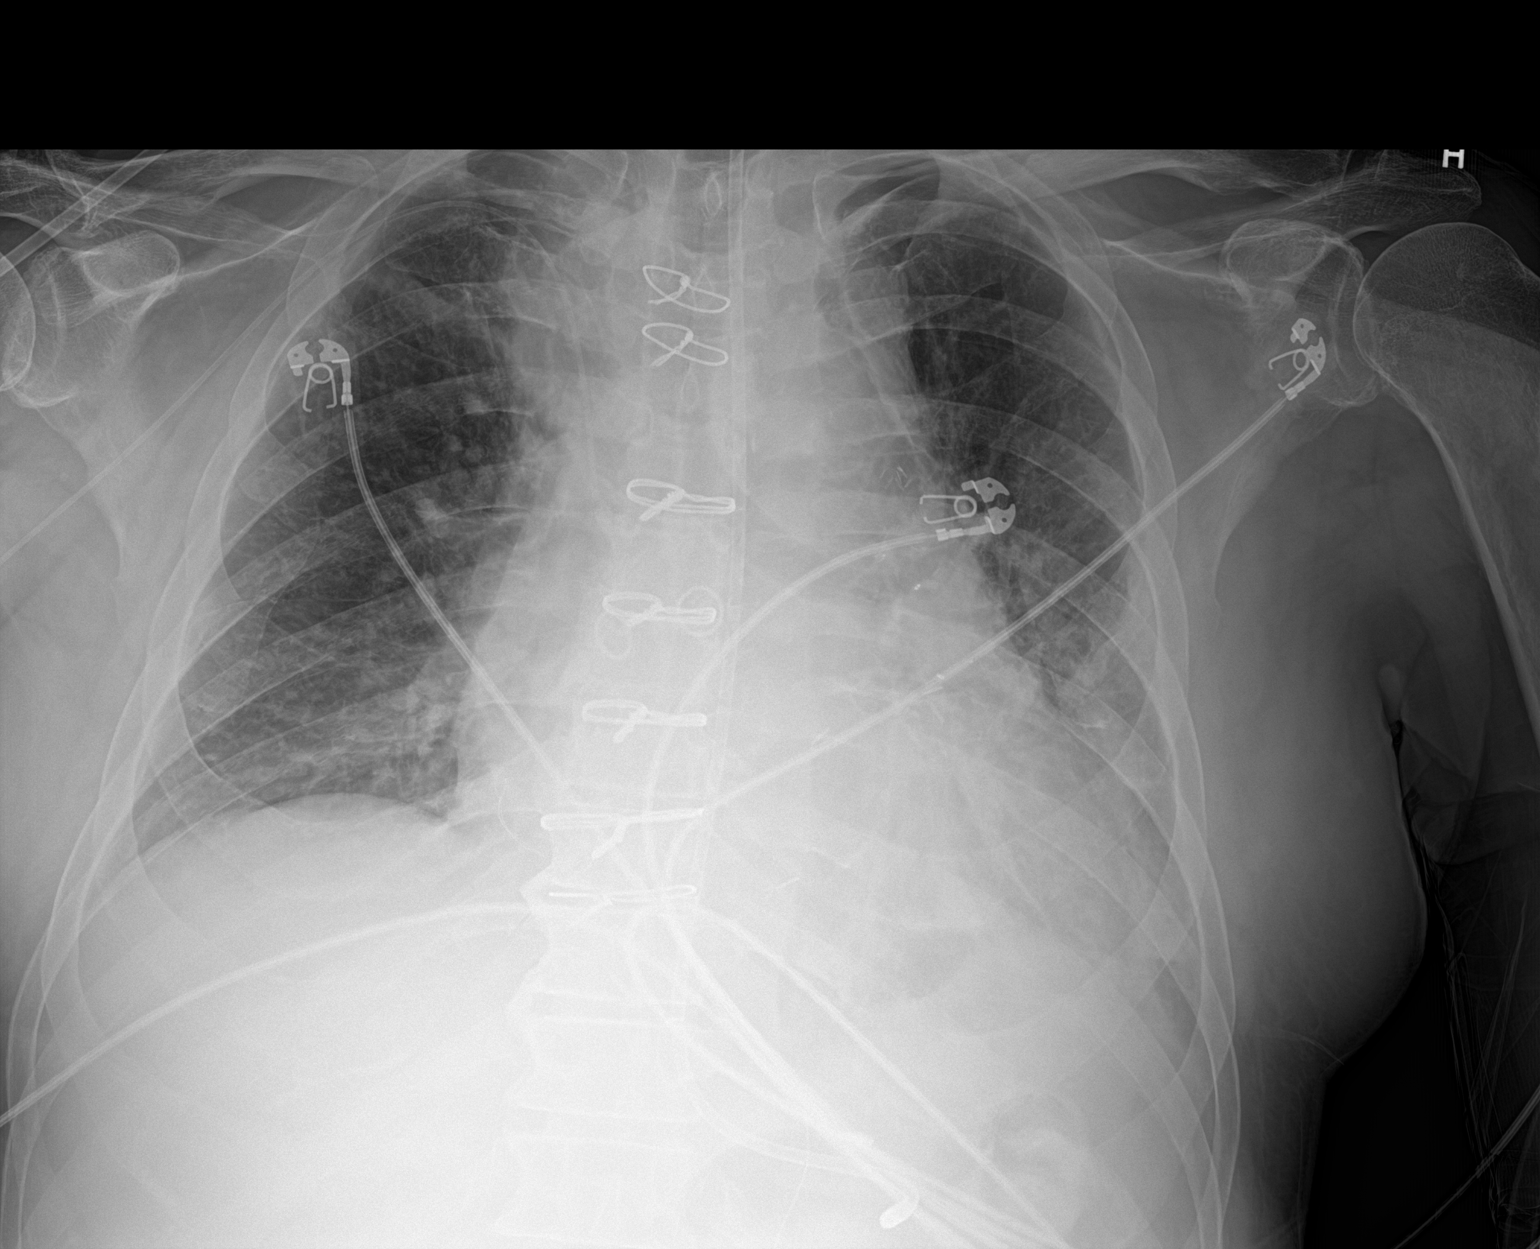

[1 of 1 positions shown; findings below may reference images not displayed]

FINDINGS: Intact sternotomy wires. Right PICC terminates in middle third of
the SVC. Enteric tube terminates in the proximal stomach. Interval
removal of left chest tube. Stable cardiomediastinal silhouette with
mild cardiomegaly. Small right apical pneumothorax, less than 5%,
not definitely changed accounting for differences in projection.
Small left apical pneumothorax, less than 5%, not appreciably
changed. No pleural effusion. No overt pulmonary edema. Similar mild
bibasilar atelectasis.
IMPRESSION: 1. Stable small bilateral apical pneumothoraces, both less than 5%.
2. Stable mild cardiomegaly without overt pulmonary edema.
3. Similar mild bibasilar atelectasis.

## 2021-03-16 IMAGING — DX DG CHEST 1V PORT
1 series · 1 of 1 positions shown · non-contrast
Comparison: March 31, 2020

CLINICAL DATA: Recent pneumothorax. Status post coronary artery
bypass grafting

EXAM:
PORTABLE CHEST 1 VIEW

[chest]
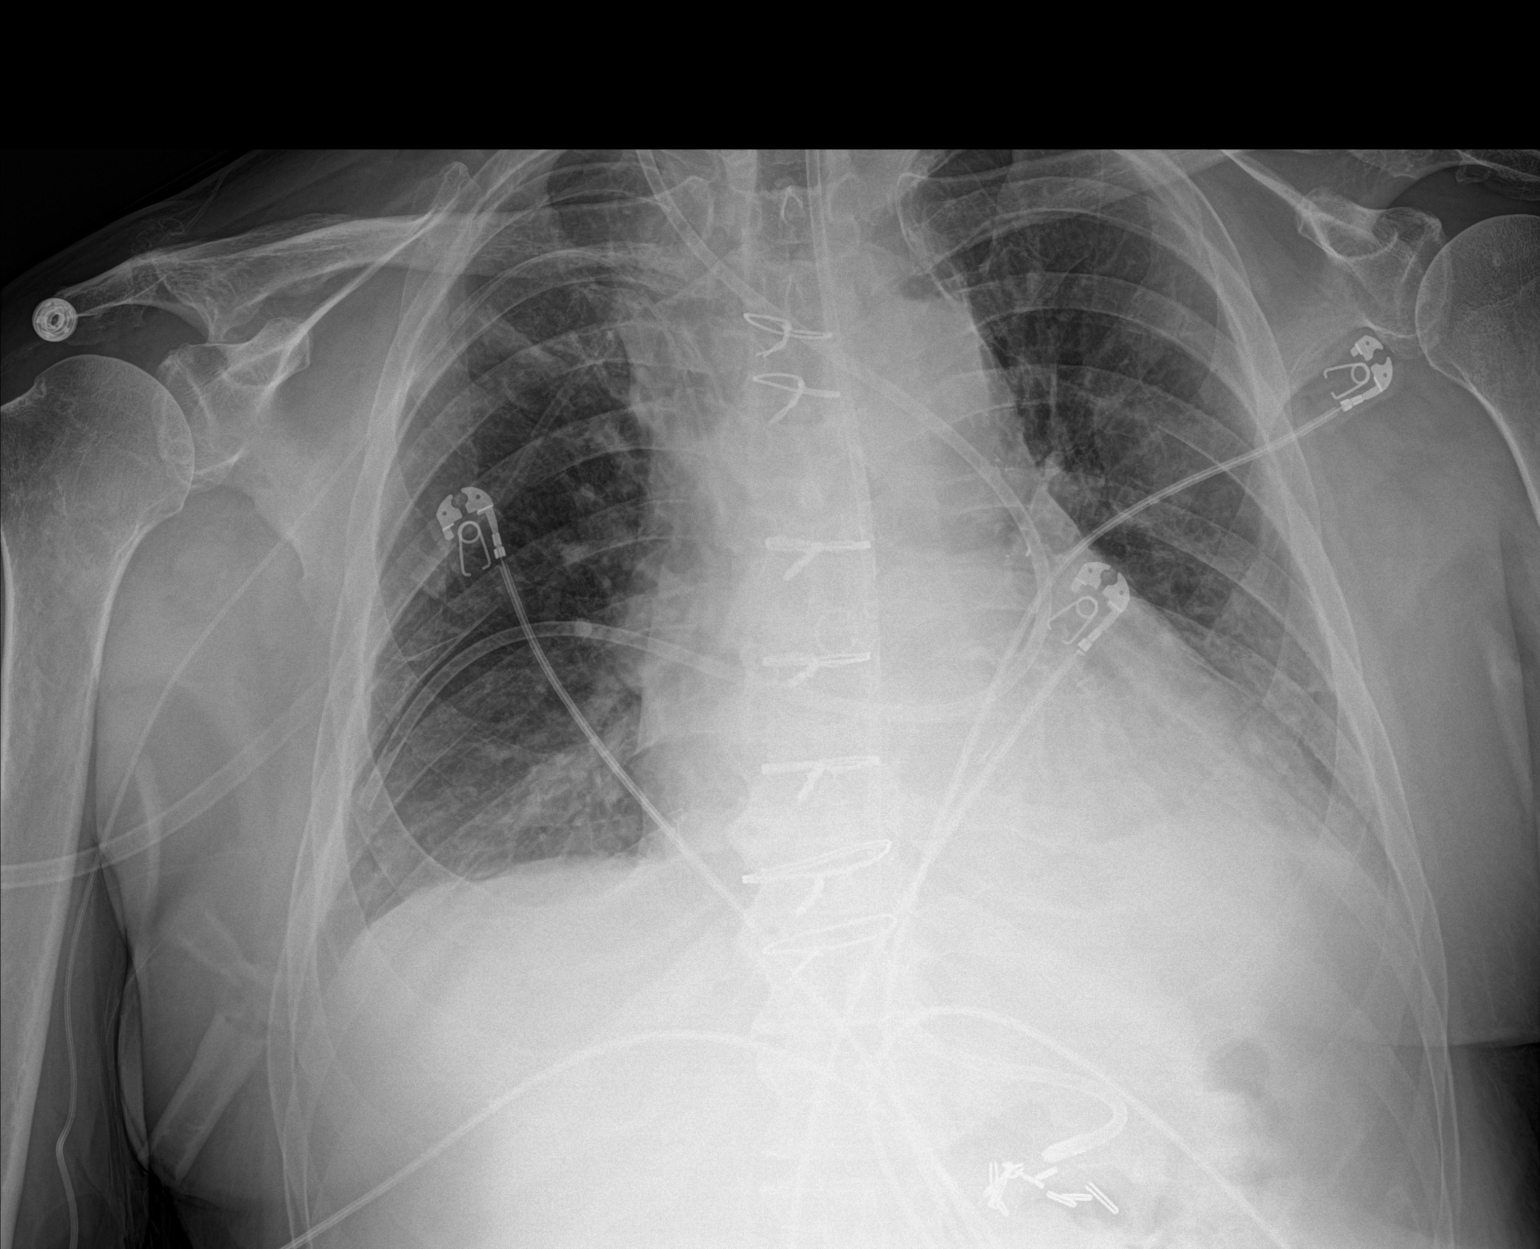

[1 of 1 positions shown; findings below may reference images not displayed]

FINDINGS: Currently, no pneumothorax is evident on either side. There is
atelectatic change in the left base. Lungs otherwise are clear.
Heart is upper normal in size with pulmonary vascularity normal.
Feeding tube tip is below the diaphragm. Central catheter tip is in
the superior vena cava. Patient is status post coronary artery
bypass grafting. No adenopathy. No bone lesions.
IMPRESSION: No pneumothorax evident currently. Mild left base atelectasis. Lungs
otherwise clear. Stable cardiac silhouette. Tube and catheter
positions as described.

## 2021-03-22 IMAGING — XA IR PERC PLACEMENT GASTROSTOMY
2 series · 2 of 2 positions shown · non-contrast
Comparison: none

CLINICAL DATA: Recent CABG, dysphagia, needs enteral feeding
support

EXAM:
PERC PLACEMENT GASTROSTOMY
FLUOROSCOPY TIME:  72 seconds; 25 mGy
TECHNIQUE: The procedure, risks, benefits, and alternatives were explained to
the patient. Questions regarding the procedure were encouraged and
answered. The patient understands and consents to the procedure.
Patient was receiving adequate prophylactic antibiotic coverage as
an inpatient.

[Series 1: fl (-) angio · 1 of 1 slices shown (1 of 2)]
[im 1/1]
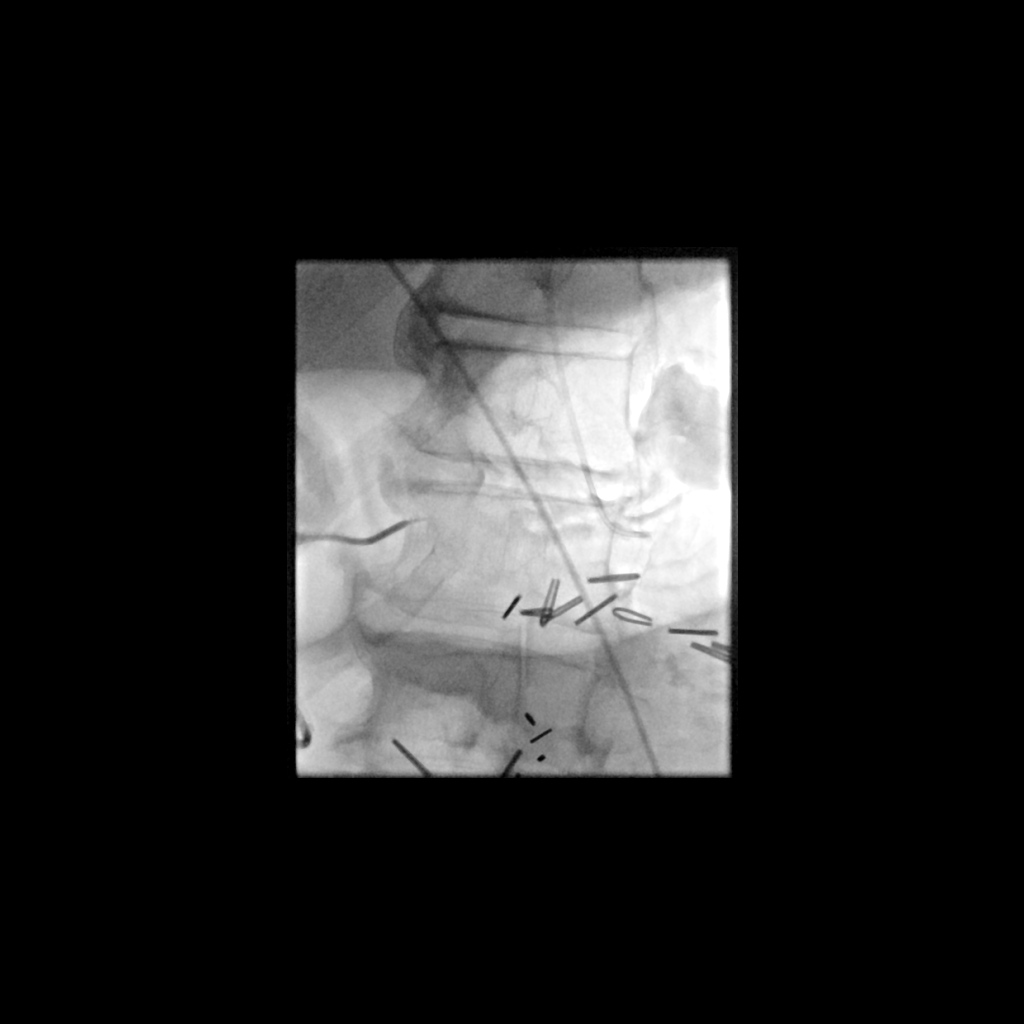

[Series 2: fl (-) angio · 1 of 1 slices shown (2 of 2)]
[im 1/1]
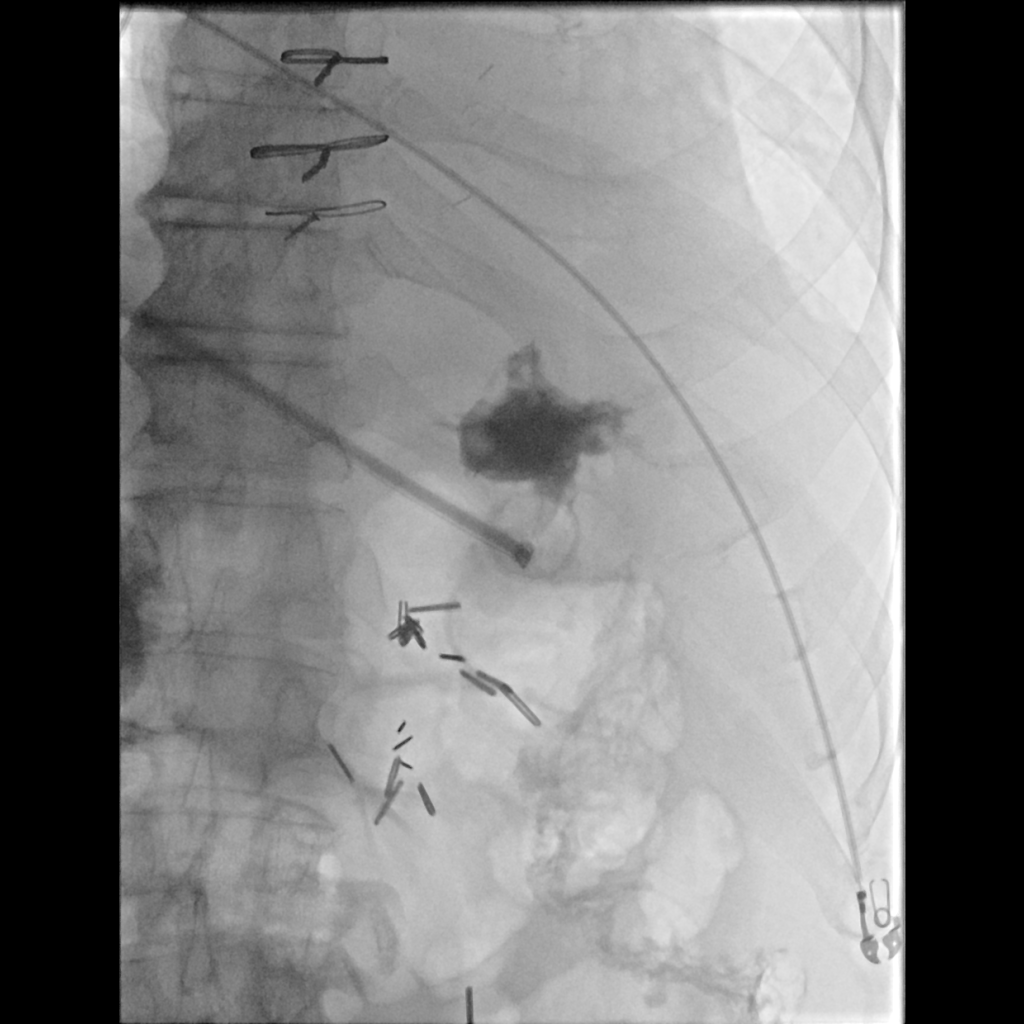

[2 of 2 positions shown; findings below may reference images not displayed]

Progression of previously administered oral barium into the colon
was confirmed fluoroscopically. A 5 French angiographic catheter was
placed as orogastric tube. The upper abdomen was prepped with
Betadine, draped in usual sterile fashion, and infiltrated locally
with 1% lidocaine.

Intravenous Fentanyl 02mcg and Versed 1.5mg were administered as
conscious sedation during continuous monitoring of the patient's
level of consciousness and physiological / cardiorespiratory status
by the radiology RN, with a total moderate sedation time of 10
minutes. 0.5 mg glucagon given IV to facilitate gastric distention.

Stomach was insufflated using air through the orogastric tube. An 18
French sheath needle was advanced percutaneously into the gastric
lumen under fluoroscopy. Gas could be aspirated and a small contrast
injection confirmed intraluminal spread. The sheath was exchanged
over a guidewire for a 9 French vascular sheath, through which the
snare device was advanced and used to snare a guidewire passed
through the orogastric tube. This was withdrawn, and the snare
attached to the 20 French pull-through gastrostomy tube, which was
advanced antegrade, positioned with the internal bumper securing the
anterior gastric wall to the anterior abdominal wall. Small contrast
injection confirms appropriate positioning. The external bumper was
applied and the catheter was flushed.

COMPLICATIONS:
COMPLICATIONS
none
IMPRESSION: 1. Technically successful 20 French pull-through gastrostomy
placement under fluoroscopy.

## 2021-04-09 NOTE — Progress Notes (Signed)
[facility]  [address] [place],  [place]  [address] [phone]  Clinic Day:  04/13/2021  Referring physician: Dorothyann Peng, NP  This document serves as a record of services personally performed by Marice Potter, MD. It was created on their behalf by Curry,Lauren E, a trained medical scribe. The creation of this record is based on the scribe's personal observations and the provider's statements to them.  HISTORY OF PRESENT ILLNESS:  The patient is a 75 y.o. 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  The patient recently decided to get back on Lupron as there was a fairly precipitous rise in his PSA level.  Of note, he was also going to start apalutamide for complete androgen blockade, but ultimately decided against taking this medication as it poses a slightly increased risk of causing seizures.  This patient has a remote history of a seizure disorder; however, he has not had one in decades.  He comes in today to reassess his PSA.  Since his last visit, the patient as bas been doing okay.  He continues to deny having any systemic symptoms which concern him for overt signs of disease progression.    PHYSICAL EXAM:  Blood pressure (!) 142/77, pulse 70, temperature 98.9 F (37.2 C), resp. rate 16, height 5' 9.5" (1.765 m), weight 194 lb 12.8 oz (88.4 kg), SpO2 96 %. Wt Readings from Last 3 Encounters:  04/13/21 194 lb 12.8 oz (88.4 kg)  03/12/21 195 lb (88.5 kg)  03/10/21 196 lb 9.6 oz (89.2 kg)   Body mass index is 28.35 kg/m. Performance status (ECOG): 0 Physical Exam Constitutional:      Appearance: Normal appearance. He is not ill-appearing.  HENT:     Mouth/Throat:     Mouth: Mucous membranes are moist.     Pharynx: Oropharynx is clear. No oropharyngeal exudate or posterior oropharyngeal erythema.  Cardiovascular:     Rate and Rhythm: Normal rate and regular rhythm.     Heart sounds: No murmur  heard.   No friction rub. No gallop.  Pulmonary:     Effort: Pulmonary effort is normal. No respiratory distress.     Breath sounds: Normal breath sounds. No wheezing, rhonchi or rales.  Chest:  Breasts:    Right: No axillary adenopathy or supraclavicular adenopathy.     Left: No axillary adenopathy or supraclavicular adenopathy.  Abdominal:     General: Bowel sounds are normal. There is no distension.     Palpations: Abdomen is soft. There is no mass.     Tenderness: There is no abdominal tenderness.  Musculoskeletal:        General: No swelling.     Right lower leg: No edema.     Left lower leg: No edema.  Lymphadenopathy:     Cervical: No cervical adenopathy.     Upper Body:     Right upper body: No supraclavicular or axillary adenopathy.     Left upper body: No supraclavicular or axillary adenopathy.     Lower Body: No right inguinal adenopathy. No left inguinal adenopathy.  Skin:    General: Skin is warm.     Coloration: Skin is not jaundiced.     Findings: No lesion or rash.  Neurological:     General: No focal deficit present.     Mental Status: He is alert and oriented to person, place, and time. Mental status is at baseline.     Cranial  Nerves: Cranial nerves are intact.  Psychiatric:        Mood and Affect: Mood normal.        Behavior: Behavior normal.        Thought Content: Thought content normal.    LABS:          ASSESSMENT & PLAN:  Assessment/Plan:  A 75 y.o. 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  I am pleased as his PSA level continues to fall.  As mentioned previously, he is solely taking androgen deprivation with Lupron for his disease management.  Clinically, he is doing well.  I will see him back in August 2022 to reassess his PSA level before proceeding with his next Lupron shot, which is being given every 3 months.  The patient understands all the plans discussed today and is in agreement with them.      I, Rita Ohara, am acting as scribe for Marice Potter, MD    I have reviewed this report as typed by the medical scribe, and it is complete and accurate.  Dequincy Macarthur Critchley, MD

## 2021-04-12 ENCOUNTER — Other Ambulatory Visit: Payer: Self-pay

## 2021-04-12 ENCOUNTER — Inpatient Hospital Stay: Payer: PPO | Attending: Oncology

## 2021-04-12 ENCOUNTER — Encounter: Payer: Self-pay | Admitting: Hematology and Oncology

## 2021-04-12 DIAGNOSIS — C61 Malignant neoplasm of prostate: Secondary | ICD-10-CM | POA: Diagnosis not present

## 2021-04-12 DIAGNOSIS — D649 Anemia, unspecified: Secondary | ICD-10-CM | POA: Diagnosis not present

## 2021-04-12 LAB — HEPATIC FUNCTION PANEL
ALT: 29 (ref 10–40)
AST: 34 (ref 14–40)
Alkaline Phosphatase: 93 (ref 25–125)
Bilirubin, Total: 0.7

## 2021-04-12 LAB — BASIC METABOLIC PANEL
BUN: 21 (ref 4–21)
CO2: 28 — AB (ref 13–22)
Chloride: 92 — AB (ref 99–108)
Creatinine: 1 (ref 0.6–1.3)
Glucose: 105
Potassium: 4.7 (ref 3.4–5.3)
Sodium: 126 — AB (ref 137–147)

## 2021-04-12 LAB — CBC: RBC: 4.41 (ref 3.87–5.11)

## 2021-04-12 LAB — COMPREHENSIVE METABOLIC PANEL
Albumin: 4.4 (ref 3.5–5.0)
Calcium: 9 (ref 8.7–10.7)

## 2021-04-12 LAB — CBC AND DIFFERENTIAL
HCT: 41 (ref 41–53)
Hemoglobin: 13.8 (ref 13.5–17.5)
Neutrophils Absolute: 3.72
Platelets: 201 (ref 150–399)
WBC: 5.9

## 2021-04-13 ENCOUNTER — Other Ambulatory Visit: Payer: Self-pay | Admitting: Oncology

## 2021-04-13 ENCOUNTER — Telehealth: Payer: Self-pay | Admitting: Oncology

## 2021-04-13 ENCOUNTER — Inpatient Hospital Stay (INDEPENDENT_AMBULATORY_CARE_PROVIDER_SITE_OTHER): Payer: PPO | Admitting: Oncology

## 2021-04-13 VITALS — BP 142/77 | HR 70 | Temp 98.9°F | Resp 16 | Ht 69.5 in | Wt 194.8 lb

## 2021-04-13 DIAGNOSIS — C61 Malignant neoplasm of prostate: Secondary | ICD-10-CM

## 2021-04-13 LAB — PROSTATE-SPECIFIC AG, SERUM (LABCORP): Prostate Specific Ag, Serum: 2.5 ng/mL (ref 0.0–4.0)

## 2021-04-13 NOTE — Telephone Encounter (Signed)
Per 6/21 los next appt scheduled and given to patient 

## 2021-05-31 ENCOUNTER — Other Ambulatory Visit: Payer: Self-pay | Admitting: Physician Assistant

## 2021-06-03 ENCOUNTER — Encounter: Payer: Self-pay | Admitting: Oncology

## 2021-06-08 ENCOUNTER — Inpatient Hospital Stay: Payer: PPO | Attending: Oncology

## 2021-06-08 ENCOUNTER — Other Ambulatory Visit: Payer: Self-pay

## 2021-06-08 DIAGNOSIS — C61 Malignant neoplasm of prostate: Secondary | ICD-10-CM | POA: Insufficient documentation

## 2021-06-08 DIAGNOSIS — Z5111 Encounter for antineoplastic chemotherapy: Secondary | ICD-10-CM | POA: Insufficient documentation

## 2021-06-09 ENCOUNTER — Inpatient Hospital Stay: Payer: PPO

## 2021-06-09 VITALS — BP 164/79 | HR 68 | Temp 98.3°F | Resp 18 | Ht 69.5 in | Wt 194.0 lb

## 2021-06-09 DIAGNOSIS — Z5111 Encounter for antineoplastic chemotherapy: Secondary | ICD-10-CM | POA: Diagnosis not present

## 2021-06-09 DIAGNOSIS — C61 Malignant neoplasm of prostate: Secondary | ICD-10-CM

## 2021-06-09 LAB — PROSTATE-SPECIFIC AG, SERUM (LABCORP): Prostate Specific Ag, Serum: 3.7 ng/mL (ref 0.0–4.0)

## 2021-06-09 MED ORDER — LEUPROLIDE ACETATE (3 MONTH) 22.5 MG IM KIT
22.5000 mg | PACK | Freq: Once | INTRAMUSCULAR | Status: AC
  Administered 2021-06-09: 22.5 mg via INTRAMUSCULAR
  Filled 2021-06-09: qty 22.5

## 2021-06-09 NOTE — Patient Instructions (Signed)
Leuprolide depot injection What is this medication? LEUPROLIDE (loo PROE lide) is a man-made protein that acts like a natural hormone in the body. It decreases testosterone in men and decreases estrogen in women. In men, this medicine is used to treat advanced prostate cancer. In women, some forms of this medicine may be used to treat endometriosis, uterinefibroids, or other male hormone-related problems. This medicine may be used for other purposes; ask your health care provider orpharmacist if you have questions. COMMON BRAND NAME(S): Eligard, Fensolv, Lupron Depot, Lupron Depot-Ped, Viadur What should I tell my care team before I take this medication? They need to know if you have any of these conditions: diabetes heart disease or previous heart attack high blood pressure high cholesterol mental illness osteoporosis pain or difficulty passing urine seizures spinal cord metastasis stroke suicidal thoughts, plans, or attempt; a previous suicide attempt by you or a family member tobacco smoker unusual vaginal bleeding (women) an unusual or allergic reaction to leuprolide, benzyl alcohol, other medicines, foods, dyes, or preservatives pregnant or trying to get pregnant breast-feeding How should I use this medication? This medicine is for injection into a muscle or for injection under the skin. It is given by a health care professional in a hospital or clinic setting. The specific product will determine how it will be given to you. Make sure youunderstand which product you receive and how often you will receive it. Talk to your pediatrician regarding the use of this medicine in children.Special care may be needed. Overdosage: If you think you have taken too much of this medicine contact apoison control center or emergency room at once. NOTE: This medicine is only for you. Do not share this medicine with others. What if I miss a dose? It is important not to miss a dose. Call your doctor  or health careprofessional if you are unable to keep an appointment. Depot injections: Depot injections are given either once-monthly, every 12 weeks, every 16 weeks, or every 24 weeks depending on the product you are prescribed. The product you are prescribed will be based on if you are male orfemale, and your condition. Make sure you understand your product and dosing. What may interact with this medication? Do not take this medicine with any of the following medications: chasteberry cisapride dronedarone pimozide thioridazine This medicine may also interact with the following medications: herbal or dietary supplements, like black cohosh or DHEA male hormones, like estrogens or progestins and birth control pills, patches, rings, or injections male hormones, like testosterone other medicines that prolong the QT interval (abnormal heart rhythm) This list may not describe all possible interactions. Give your health care provider a list of all the medicines, herbs, non-prescription drugs, or dietary supplements you use. Also tell them if you smoke, drink alcohol, or use illegaldrugs. Some items may interact with your medicine. What should I watch for while using this medication? Visit your doctor or health care professional for regular checks on your progress. During the first weeks of treatment, your symptoms may get worse, but then will improve as you continue your treatment. You may get hot flashes, increased bone pain, increased difficulty passing urine, or an aggravation of nerve symptoms. Discuss these effects with your doctor or health careprofessional, some of them may improve with continued use of this medicine. Male patients may experience a menstrual cycle or spotting during the first months of therapy with this medicine. If this continues, contact your doctor orhealth care professional. This medicine may increase blood sugar. Ask  your healthcare provider if changesin diet or medicines  are needed if you have diabetes. What side effects may I notice from receiving this medication? Side effects that you should report to your doctor or health care professionalas soon as possible: allergic reactions like skin rash, itching or hives, swelling of the face, lips, or tongue breathing problems chest pain depression or memory disorders pain in your legs or groin pain at site where injected or implanted seizures severe headache signs and symptoms of high blood sugar such as being more thirsty or hungry or having to urinate more than normal. You may also feel very tired or have blurry vision swelling of the feet and legs suicidal thoughts or other mood changes visual changes vomiting Side effects that usually do not require medical attention (report to yourdoctor or health care professional if they continue or are bothersome): breast swelling or tenderness decrease in sex drive or performance diarrhea hot flashes loss of appetite muscle, joint, or bone pains nausea redness or irritation at site where injected or implanted skin problems or acne This list may not describe all possible side effects. Call your doctor for medical advice about side effects. You may report side effects to FDA at1-800-FDA-1088. Where should I keep my medication? This drug is given in a hospital or clinic and will not be stored at home. NOTE: This sheet is a summary. It may not cover all possible information. If you have questions about this medicine, talk to your doctor, pharmacist, orhealth care provider.  2022 Elsevier/Gold Standard (2019-09-11 10:35:13)

## 2021-06-10 NOTE — Progress Notes (Signed)
[facility]  [address] [place],  [place]  [address] [phone]  Clinic Day:  06/11/2021  Referring physician: Dorothyann Peng, NP  This document serves as a record of services personally performed by Marice Potter, MD. It was created on their behalf by Curry,Lauren E, a trained medical scribe. The creation of this record is based on the scribe's personal observations and the provider's statements to them.  HISTORY OF PRESENT ILLNESS:  The patient is a 75 y.o. 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  The patient is on Lupron.  Of note, he was also going to start apalutamide for complete androgen blockade, but ultimately decided against taking this medication because it poses a slightly increased risk of seizures.  This patient has a remote history of a seizure disorder; however, he has not had one in decades.  He comes in today to reassess his PSA.  Since his last visit, the patient as bas been doing okay.  He continues to deny having any systemic symptoms which concern him for overt signs of disease progression.    PHYSICAL EXAM:  Blood pressure (!) 230/98, pulse 67, temperature 98.1 F (36.7 C), resp. rate 18, height 5' 9.5" (1.765 m), weight 198 lb 12.8 oz (90.2 kg), SpO2 98 %. Wt Readings from Last 3 Encounters:  06/11/21 198 lb 12.8 oz (90.2 kg)  06/09/21 194 lb (88 kg)  04/13/21 194 lb 12.8 oz (88.4 kg)   Body mass index is 28.94 kg/m. Performance status (ECOG): 1 Physical Exam Constitutional:      Appearance: Normal appearance. He is not ill-appearing.  HENT:     Mouth/Throat:     Mouth: Mucous membranes are moist.     Pharynx: Oropharynx is clear. No oropharyngeal exudate or posterior oropharyngeal erythema.  Cardiovascular:     Rate and Rhythm: Normal rate and regular rhythm.     Heart sounds: No murmur heard.   No friction rub. No gallop.  Pulmonary:     Effort: Pulmonary effort is  normal. No respiratory distress.     Breath sounds: Normal breath sounds. No wheezing, rhonchi or rales.  Abdominal:     General: Bowel sounds are normal. There is no distension.     Palpations: Abdomen is soft. There is no mass.     Tenderness: There is no abdominal tenderness.  Musculoskeletal:        General: No swelling.     Right lower leg: No edema.     Left lower leg: No edema.  Lymphadenopathy:     Cervical: No cervical adenopathy.     Upper Body:     Right upper body: No supraclavicular or axillary adenopathy.     Left upper body: No supraclavicular or axillary adenopathy.     Lower Body: No right inguinal adenopathy. No left inguinal adenopathy.  Skin:    General: Skin is warm.     Coloration: Skin is not jaundiced.     Findings: No lesion or rash.  Neurological:     General: No focal deficit present.     Mental Status: He is alert and oriented to person, place, and time. Mental status is at baseline.     Cranial Nerves: Cranial nerves are intact.  Psychiatric:        Mood and Affect: Mood normal.        Behavior: Behavior normal.        Thought Content: Thought content normal.  LABS:    Component Ref Range & Units 2 d ago 1 mo ago 3 mo ago 6 mo ago  Prostate Specific Ag, Serum 0.0 - 4.0 ng/mL 3.7  2.5 CM  8.8 High  CM  0.3 CM         ASSESSMENT & PLAN:  Assessment/Plan:  A 75 y.o. 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  His PSA level has risen since his last visit, which suggests his disease may be losing sensitivity to sole androgen deprivation therapy.  This would no be particularly surprising as he has been on and off Lupron for the past handful of years.  If he has 3 consecutive rises in his PSA level or an abrupt rise with his next check, he understands that I would add an antiandrogen to his Lupron shots to re-establish complete androgen blockade.  Clinically, he is doing well.  He received his Lupron shot this  week.  I will see him back in 1 month to reassess his PSA level.   The patient understands all the plans discussed today and is in agreement with them.    I, Rita Ohara, am acting as scribe for Marice Potter, MD    I have reviewed this report as typed by the medical scribe, and it is complete and accurate.  Jahkeem Kurka Macarthur Critchley, MD

## 2021-06-11 ENCOUNTER — Other Ambulatory Visit: Payer: Self-pay

## 2021-06-11 ENCOUNTER — Other Ambulatory Visit: Payer: Self-pay | Admitting: Oncology

## 2021-06-11 ENCOUNTER — Inpatient Hospital Stay (INDEPENDENT_AMBULATORY_CARE_PROVIDER_SITE_OTHER): Payer: PPO | Admitting: Oncology

## 2021-06-11 ENCOUNTER — Telehealth: Payer: Self-pay | Admitting: Oncology

## 2021-06-11 VITALS — BP 230/98 | HR 67 | Temp 98.1°F | Resp 18 | Ht 69.5 in | Wt 198.8 lb

## 2021-06-11 DIAGNOSIS — C61 Malignant neoplasm of prostate: Secondary | ICD-10-CM

## 2021-06-11 NOTE — Telephone Encounter (Signed)
Per 8/19 LOS, patient scheduled for Sept Appt's.  Gave patient Appt Summary

## 2021-06-12 ENCOUNTER — Encounter: Payer: Self-pay | Admitting: Oncology

## 2021-06-23 ENCOUNTER — Telehealth: Payer: Self-pay | Admitting: Internal Medicine

## 2021-06-23 MED ORDER — ROSUVASTATIN CALCIUM 20 MG PO TABS: 20.0000 mg | ORAL_TABLET | Freq: Every day | ORAL | 3 refills | Status: DC

## 2021-06-23 NOTE — Telephone Encounter (Signed)
Pt c/o BP issue: STAT if pt c/o blurred vision, one-sided weakness or slurred speech  1. What are your last 5 BP readings? 200/105  2. Are you having any other symptoms (ex. Dizziness, headache, blurred vision, passed out)? No   3. What is your BP issue? Spikes in BP

## 2021-06-23 NOTE — Telephone Encounter (Signed)
Called patient left message on personal voice mail to call back. 

## 2021-06-23 NOTE — Telephone Encounter (Signed)
Spoke to patient he will start taking medications as prescribed.Advised to check B/P morning and night.Appointment scheduled with Coletta Memos NP 9/27 at 2:15 pm.He will bring a list of all medications and B/P readings to appointment.Marland Kitchen

## 2021-06-23 NOTE — Telephone Encounter (Signed)
Called pt in regards to elevated BP.  He reports that his BP was 200/105 yesterday with associated dull headache. He reports BP today is 175/88.  When asked questioned about BP meds he is taking carvedilol as ordered but has not taken Valsartan in the last week or so.  He had not taken med d/t reported low BP in the afternoon.  He does not have any afternoon BP readings to report.  I strongly encouraged pt to take Valsartan 80 mg PO QD as ordered.  He reports he did take it today around 10:30 am.  I advised pt to take BP in the morning 1.5-2 hours after taking BP meds and again in the evening to obtain BP trends.  I strongly advised pt to call 911 if SBP is up to 200.  He expressed that he understands.  Will have NL triage consult DOD

## 2021-06-23 NOTE — Telephone Encounter (Signed)
Malachy Mood - I agree, if he has not been taking the medicine as directed, he needs to take it every day as directed and get BP readings at least twice daily (morning and evening) so that we can understand how to adjust his medicine as needed  Thanks,  Dr. Lemmie Evens

## 2021-06-23 NOTE — Telephone Encounter (Signed)
Called patient no answer.Left message on personal voice mail to call back. 

## 2021-07-05 NOTE — Progress Notes (Signed)
[facility]  [address] [place],  [place]  [address] [phone]  Clinic Day:  07/12/2021  Referring physician: Dorothyann Peng, NP  This document serves as a record of services personally performed by Marice Potter, MD. It was created on their behalf by Curry,Lauren E, a trained medical scribe. The creation of this record is based on the scribe's personal observations and the provider's statements to them.  HISTORY OF PRESENT ILLNESS:  The patient is a 75 y.o. 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  The patient is on Lupron.  Of note, he was also going to start apalutamide for complete androgen blockade, but ultimately decided against taking this medication because it poses a slightly increased risk of seizures.  This patient has a remote history of a seizure disorder; however, he has not had one in decades.  He comes in today to reassess his PSA.  Since his last visit, the patient as bas been doing okay.  He continues to deny having any systemic symptoms which concern him for overt signs of disease progression.    PHYSICAL EXAM:  Blood pressure (!) 158/80, pulse 83, temperature 97.9 F (36.6 C), resp. rate 18, height 5' 9.5" (1.765 m), weight 203 lb 14.4 oz (92.5 kg), SpO2 98 %. Wt Readings from Last 3 Encounters:  07/12/21 203 lb 14.4 oz (92.5 kg)  06/11/21 198 lb 12.8 oz (90.2 kg)  06/09/21 194 lb (88 kg)   Body mass index is 29.68 kg/m. Performance status (ECOG): 1 Physical Exam Constitutional:      Appearance: Normal appearance. He is not ill-appearing.  HENT:     Mouth/Throat:     Mouth: Mucous membranes are moist.     Pharynx: Oropharynx is clear. No oropharyngeal exudate or posterior oropharyngeal erythema.  Cardiovascular:     Rate and Rhythm: Normal rate and regular rhythm.     Heart sounds: No murmur heard.   No friction rub. No gallop.  Pulmonary:     Effort: Pulmonary effort is  normal. No respiratory distress.     Breath sounds: Normal breath sounds. No wheezing, rhonchi or rales.  Abdominal:     General: Bowel sounds are normal. There is no distension.     Palpations: Abdomen is soft. There is no mass.     Tenderness: There is no abdominal tenderness.  Musculoskeletal:        General: No swelling.     Right lower leg: No edema.     Left lower leg: No edema.  Lymphadenopathy:     Cervical: No cervical adenopathy.     Upper Body:     Right upper body: No supraclavicular or axillary adenopathy.     Left upper body: No supraclavicular or axillary adenopathy.     Lower Body: No right inguinal adenopathy. No left inguinal adenopathy.  Skin:    General: Skin is warm.     Coloration: Skin is not jaundiced.     Findings: No lesion or rash.  Neurological:     General: No focal deficit present.     Mental Status: He is alert and oriented to person, place, and time. Mental status is at baseline.     Cranial Nerves: Cranial nerves are intact.  Psychiatric:        Mood and Affect: Mood normal.        Behavior: Behavior normal.        Thought Content: Thought content normal.  LABS:    Ref. Range 03/01/2021 00:00 04/12/2021 00:00 04/12/2021 13:09 06/08/2021 14:15 07/09/2021 00:00 07/09/2021 14:29  Prostate Specific Ag, Serum Latest Ref Range: 0.0 - 4.0 ng/mL   2.5 3.7    Prostatic Specific Antigen Latest Ref Range: 0.00 - 4.00 ng/mL      7.96 (H)       ASSESSMENT & PLAN:  Assessment/Plan:  A 75 y.o. 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  His PSA level has more than doubled over the past month.  This suggests androgen deprivation therapy is not enough to keep his prostate cancer under control.  I will once again talk to the patient about adding an antiandrogen to get his metastatic prostate cancer under control.  I will see him back in 2 months to reassess his disease.  The patient understands all the plans discussed today  and is in agreement with them.    I, Rita Ohara, am acting as scribe for Marice Potter, MD    I have reviewed this report as typed by the medical scribe, and it is complete and accurate.  Dequincy Macarthur Critchley, MD

## 2021-07-09 ENCOUNTER — Inpatient Hospital Stay: Payer: PPO | Attending: Oncology

## 2021-07-09 ENCOUNTER — Other Ambulatory Visit: Payer: Self-pay | Admitting: Hematology and Oncology

## 2021-07-09 DIAGNOSIS — C61 Malignant neoplasm of prostate: Secondary | ICD-10-CM | POA: Insufficient documentation

## 2021-07-09 LAB — CBC AND DIFFERENTIAL
HCT: 36 — AB (ref 41–53)
Hemoglobin: 12.7 — AB (ref 13.5–17.5)
Neutrophils Absolute: 3.84
Platelets: 205 (ref 150–399)
WBC: 6.5

## 2021-07-09 LAB — BASIC METABOLIC PANEL
BUN: 21 (ref 4–21)
CO2: 24 — AB (ref 13–22)
Chloride: 89 — AB (ref 99–108)
Creatinine: 1 (ref 0.6–1.3)
Glucose: 135
Potassium: 4.3 (ref 3.4–5.3)
Sodium: 124 — AB (ref 137–147)

## 2021-07-09 LAB — COMPREHENSIVE METABOLIC PANEL
Albumin: 4.1 (ref 3.5–5.0)
Calcium: 9 (ref 8.7–10.7)

## 2021-07-09 LAB — CBC
MCV: 94 (ref 80–94)
RBC: 3.85 — AB (ref 3.87–5.11)

## 2021-07-09 LAB — HEPATIC FUNCTION PANEL
ALT: 17 (ref 10–40)
AST: 34 (ref 14–40)
Alkaline Phosphatase: 85 (ref 25–125)
Bilirubin, Total: 0.7

## 2021-07-12 ENCOUNTER — Telehealth: Payer: Self-pay | Admitting: Oncology

## 2021-07-12 ENCOUNTER — Other Ambulatory Visit: Payer: Self-pay | Admitting: Oncology

## 2021-07-12 ENCOUNTER — Inpatient Hospital Stay (INDEPENDENT_AMBULATORY_CARE_PROVIDER_SITE_OTHER): Payer: PPO | Admitting: Oncology

## 2021-07-12 VITALS — BP 158/80 | HR 83 | Temp 97.9°F | Resp 18 | Ht 69.5 in | Wt 203.9 lb

## 2021-07-12 DIAGNOSIS — C61 Malignant neoplasm of prostate: Secondary | ICD-10-CM

## 2021-07-12 LAB — PSA: Prostatic Specific Antigen: 7.96 ng/mL — ABNORMAL HIGH (ref 0.00–4.00)

## 2021-07-12 NOTE — Telephone Encounter (Signed)
Per 9/19 LOS, patient scheduled Nov Appt's.  Gave patient Appt Summary

## 2021-07-19 NOTE — Progress Notes (Signed)
Cardiology Clinic Note   Patient Name: Connor [name] [facility] Date of Encounter: 07/20/2021  Primary Care Provider:  Dorothyann Peng, NP Primary Cardiologist:  Connor Casino, MD  Patient Profile    Connor [name] 75 year old male presents the clinic today for follow-up evaluation of his essential hypertension.  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 Erling [name] - HE RETIRED AND PT LAST SAW DR. PENUMALLI   Stroke ([place]) DEC 2013   UNABLE TO SPEAK OR MOVE AND RT SIDE WEAKNESS AND LOSS OF SKIN SENSITIVITY TO HEAT AND COLD ON RT SIDE, DOUBLE VISION. BALANCE PROBLEMS---STATES STILL HAS DOUBLE VISION AND BALANCE PROBLEM AND RT SIDED LOSS OF SKIN SENSITIVITY   Past Surgical History:  Procedure Laterality Date   CORONARY ARTERY BYPASS GRAFT N/A 03/27/2020   Procedure: CORONARY ARTERY BYPASS GRAFTING (CABG) x Three, using left internal mammary artery and right leg greater saphenous vein harvested endoscopically;  Surgeon: Connor Poot, MD;  Location: [place];  Service: Open Heart Surgery;  Laterality: N/A;   hydrocelectomy  11/2000   left   IR FLUORO GUIDE CV LINE RIGHT  08/31/2017   IR GASTROSTOMY TUBE MOD SED  04/07/2020   IR GASTROSTOMY TUBE REMOVAL  07/20/2020   IR US GUIDE VASC ACCESS  RIGHT  08/31/2017   JOINT REPLACEMENT     LEFT TOTAL KNEE ARTHROPLASTY   MOUTH SURGERY  2019   NEPHRECTOMY  1990's   left   NEPHRECTOMY     PROSTATE BIOPSY  05/20/14   Gleason 4+3=7, vol 25 gm   RIGHT/LEFT HEART CATH AND CORONARY ANGIOGRAPHY N/A 03/24/2020   Procedure: RIGHT/LEFT HEART CATH AND CORONARY ANGIOGRAPHY;  Surgeon: Connor Blanks, MD;  Location: [place] [facility];  Service: Cardiovascular;  Laterality: N/A;   TEE WITHOUT CARDIOVERSION  10/04/2011   Procedure: TRANSESOPHAGEAL ECHOCARDIOGRAM (TEE);  Surgeon: Connor Furbish, MD;  Location: [facility] END[facility]COPY;  Service: Cardiovascular;  Laterality: N/A;   TEE WITHOUT CARDIOVERSION N/A 03/27/2020   Procedure: TRANSESOPHAGEAL ECHOCARDIOGRAM (TEE);  Surgeon: Connor [name], Connor Salina, MD;  Location: [facility];  Service: Open Heart Surgery;  Laterality: N/A;   TOTAL KNEE ARTHROPLASTY  2011   left   TOTAL KNEE ARTHROPLASTY Right 02/24/2014   Procedure: RIGHT TOTAL KNEE ARTHROPLASTY;  Surgeon: Gearlean Alf, MD;  Location: [facility];  Service: Orthopedics;  Laterality: Right;    Allergies  Allergies  Allergen Reactions   Morphine And Related Nausea And Vomiting    History of Present Illness    Connor [name] 75 year old male has a PMH of CVA, HTN,  orthostatic hypotension, acute CHF, ischemic cardiomyopathy, CAD status post CABG x3 (LIMA-LAD SVG-OM, SVG-PDA, 03/27/2020), bilateral carotid bruit, GERD, dizziness, and HLD.  He was seen by Dr. Debara [name] on 01/15/2021.  During that time he was doing well.  He denied shortness of breath and chest discomfort.  His blood pressure was well controlled.  He previously required midodrine therapy but did at that time require up titration of his carvedilol.  He was noticing higher blood pressures in the evening.  He continued to take his amlodipine/valsartan combination.  However, he was experiencing low blood pressures and trying to take only half of his amlodipine/valsartan.  Dr. Debara [name] discontinued amlodipine and  continued valsartan 80 mg daily.  He contacted the nurse triage line on 06/23/2021 with complaints of spikes in his blood pressure.  His blood pressure was reading 200/105.  He reported compliance with his carvedilol but had not been taking his valsartan.  He presents the clinic today for follow-up evaluation states he self continued carvedilol because he did not like how the medication made his blood pressure fluctuate.  He had some amlodipine/valsartan leftover from prior to his heart surgery.  He was taking this medication along with his evening valsartan.  His blood pressure log shows that his blood pressure is much better controlled at this time.  He has also been eating a good amount of soup and drinking sports drinks.  I will change his valsartan to 160 mg twice daily and continue his amlodipine.  We will give him a salty 6 diet sheet and have him maintain a blood pressure log.  I will order a BMP in 1 week and have him follow-up in 1 month.  Today he denies chest pain, shortness of breath, lower extremity edema, fatigue, palpitations, melena, hematuria, hemoptysis, diaphoresis, weakness, presyncope, syncope, orthopnea, and PND.   Home Medications    Prior to Admission medications   Medication Sig Start Date End Date Taking? Authorizing Provider  aspirin EC 81 MG tablet Take 81 mg by mouth daily. Swallow whole.    [provider]  Bicalutamide (CASODEX PO) Take by mouth as directed. Patient not taking: Reported on 03/12/2021    [provider]  calcium-vitamin D (OSCAL WITH D) 500-200 MG-UNIT tablet Take 1 tablet by mouth 2 (two) times daily. 1500mg  / 200unit tablet    [provider]  carvedilol (COREG) 12.5 MG tablet TAKE 1 TABLET(12.5 MG) BY MOUTH TWICE DAILY WITH A MEAL 03/01/21   Nafziger, Tommi Rumps, NP  chlorhexidine (PERIDEX) 0.12 % solution RINSE AND GARGLE 15 ML BY MOUTH OR THROAT TWICE DAILY 12/03/20   Nafziger, Tommi Rumps, NP  Cyanocobalamin (B-12 PO) Take by mouth.     [provider]  lamoTRIgine (LAMICTAL) 100 MG tablet Take 1 tablet (100 mg total) by mouth daily AND 2 tablets (200 mg total) at bedtime. 08/24/20   Connor [name], Earlean Polka, MD  rosuvastatin (CRESTOR) 20 MG tablet Take 1 tablet (20 mg total) by mouth daily. 06/23/21 09/21/21  Connor Casino, MD  valsartan (DIOVAN) 80 MG tablet Take 1 tablet (80 mg total) by mouth daily. 01/15/21   Connor Casino, MD    Family History    Family History  Problem Relation Age of Onset   Diabetes Mother    Heart disease Mother    Heart disease Father    He indicated that his mother is deceased. He indicated that his father is deceased. He indicated that his brother is alive.  Social History    Social  History   Socioeconomic History   Marital status: Divorced    Spouse name: Not on file   Number of children: 2   Years of education: 12th   Highest education level: Not on file  Occupational History   Occupation: Information systems manager: OTHER    Comment: [employer]  Tobacco Use   Smoking status: Never   Smokeless tobacco: Never  Vaping Use   Vaping Use: Never used  Substance and Sexual Activity   Alcohol use: Yes    Alcohol/week: 3.0 standard drinks    Types: 3 Cans of beer per week    Comment: 3 beers a day    Drug use: No   Sexual activity: Not Currently  Other Topics Concern   Not on file  Social History Narrative   Patient is divorced with 2 children.   Patient is right handed.   Patient has hs education.   Patient drinks 2 cups daily.   Social Determinants of Health   Financial Resource Strain: Low Risk    Difficulty of Paying Living Expenses: Not hard at all  Food Insecurity: No Food Insecurity   Worried About Charity fundraiser in the Last Year: Never true   [place] in the Last Year: Never true  Transportation Needs: No Transportation Needs   Lack of Transportation (Medical): No   Lack of Transportation (Non-Medical): No  Physical Activity: Inactive    Days of Exercise per Week: 0 days   Minutes of Exercise per Session: 0 min  Stress: No Stress Concern Present   Feeling of Stress : Not at all  Social Connections: Moderately Isolated   Frequency of Communication with Friends and Family: More than three times a week   Frequency of Social Gatherings with Friends and Family: More than three times a week   Attends Religious Services: Never   Marine scientist or Organizations: Yes   Attends Archivist Meetings: Never   Marital Status: Divorced  Human resources officer Violence: Not At Risk   Fear of Current or Ex-Partner: No   Emotionally Abused: No   Physically Abused: No   Sexually Abused: No     Review of Systems    General:  No chills, fever, night sweats or weight changes.  Cardiovascular:  No chest pain, dyspnea on exertion, edema, orthopnea, palpitations, paroxysmal nocturnal dyspnea. Dermatological: No rash, lesions/masses Respiratory: No cough, dyspnea Urologic: No hematuria, dysuria Abdominal:   No nausea, vomiting, diarrhea, bright red blood per rectum, melena, or hematemesis Neurologic:  No visual changes, wkns, changes in mental status. All other systems reviewed and are otherwise negative except as noted above.  Physical Exam    VS:  BP (!) 142/64 (BP Location: Left Arm, Patient Position: Sitting, Cuff Size: Normal)   Pulse 78   Ht 5\' 11"  (1.803 m)   Wt 201 lb 6.4 oz (91.4 kg)   SpO2 97%   BMI 28.09 kg/m  , BMI Body mass index is 28.09 kg/m. GEN: Well nourished, well developed, in no acute distress. HEENT: normal. Neck: Supple, no JVD, carotid bruits, or masses. Cardiac: RRR, no murmurs, rubs, or gallops. No clubbing, cyanosis, edema.  Radials/DP/PT 2+ and equal bilaterally.  Respiratory:  Respirations regular and unlabored, clear to auscultation bilaterally. GI: Soft, nontender, nondistended, BS + x 4. MS: no deformity or atrophy. Skin: warm and dry, no rash. Neuro:  Strength and sensation are  intact. Psych: Normal affect.  Accessory Clinical Findings  Recent Labs: 09/29/2020: TSH 0.89 07/09/2021: ALT 17; BUN 21; Creatinine 1.0; Hemoglobin 12.7; Platelets 205; Potassium 4.3; Sodium 124   Recent Lipid Panel    Component Value Date/Time   CHOL 153 09/29/2020 1326   TRIG 74 09/29/2020 1326   HDL 71 09/29/2020 1326   CHOLHDL 2.2 09/29/2020 1326   VLDL 25 12/05/2011 0730   LDLCALC 67 09/29/2020 1326    ECG personally reviewed by me today-none today. Echocardiogram 07/09/2020  IMPRESSIONS     1. Left ventricular ejection fraction, by estimation, is 55 to 60%. The  left ventricle has normal function. The left ventricle has no regional  wall motion abnormalities. There is mild left ventricular hypertrophy.  Left ventricular diastolic parameters  are consistent with Grade I diastolic dysfunction (impaired relaxation).   2. Right ventricular systolic function is normal. The right ventricular  size is normal. There is normal pulmonary artery systolic pressure. The  estimated right ventricular systolic pressure is 34.1 mmHg.   3. The mitral valve is degenerative. Trivial mitral valve regurgitation.  No evidence of mitral stenosis. Moderate mitral annular calcification.   4. The aortic valve is tricuspid. Aortic valve regurgitation is trivial.  Mild aortic valve sclerosis is present, with no evidence of aortic valve  stenosis.   5. Aortic dilatation noted. There is mild dilatation of the ascending  aorta, measuring 38 mm. Assessment & Plan   1.  Essential hypertension-BP today 142/64.  Better control at home with the addition of valsartan back to his medication regimen. Start valsartan 160 mg BID Continue amlodipine 5 mg daily Heart healthy low-sodium diet-salty 6 given Increase physical activity as tolerated Order BMP in 1 week. Maintain blood pressure log  Coronary artery disease-denies chest pain or recent episodes of arm neck back or chest discomfort.  Status post  CABG x3 6/21. Continue aspirin,  valsartan, rosuvastatin Heart healthy low-sodium diet-salty 6 given Increase physical activity as tolerated  Ischemic cardiomyopathy-no increased DOE or activity intolerance.  Echocardiogram 07/09/2020 showed EF 55-60%, G1 DD, and trivial mitral valve regurgitation Continue carvedilol, valsartan  Hyperlipidemia-09/29/2020: Cholesterol 153; HDL 71; LDL Cholesterol (Calc) 67; Triglycerides 74 Continue aspirin, rosuvastatin Heart healthy low-sodium high-fiber diet Increase physical activity as tolerated  Disposition: Follow-up with Dr. Debara [name] in 3-4 months.  Jossie Ng. Kenzie Flakes NP-C    07/20/2021, 2:45 PM [facility] [place] [address] Office [phone] Fax [phone]  Notice: This dictation was prepared with Dragon dictation along with smaller phrase technology. Any transcriptional errors that result from this process are unintentional and may not be corrected upon review.  I spent 14 minutes examining this patient, reviewing medications, and using patient centered shared decision making involving her cardiac care.  Prior to her visit I spent greater than 20 minutes reviewing her past medical history,  medications, and prior cardiac tests.

## 2021-07-20 ENCOUNTER — Ambulatory Visit: Payer: PPO | Admitting: General Practice

## 2021-07-20 ENCOUNTER — Encounter: Payer: Self-pay | Admitting: General Practice

## 2021-07-20 ENCOUNTER — Other Ambulatory Visit: Payer: Self-pay

## 2021-07-20 VITALS — BP 142/64 | HR 78 | Ht 71.0 in | Wt 201.4 lb

## 2021-07-20 DIAGNOSIS — I10 Essential (primary) hypertension: Secondary | ICD-10-CM | POA: Diagnosis not present

## 2021-07-20 DIAGNOSIS — Z79899 Other long term (current) drug therapy: Secondary | ICD-10-CM

## 2021-07-20 DIAGNOSIS — E785 Hyperlipidemia, unspecified: Secondary | ICD-10-CM

## 2021-07-20 DIAGNOSIS — I255 Ischemic cardiomyopathy: Secondary | ICD-10-CM | POA: Diagnosis not present

## 2021-07-20 DIAGNOSIS — I25810 Atherosclerosis of coronary artery bypass graft(s) without angina pectoris: Secondary | ICD-10-CM | POA: Diagnosis not present

## 2021-07-20 MED ORDER — AMLODIPINE BESYLATE 5 MG PO TABS: 5.0000 mg | ORAL_TABLET | Freq: Every day | ORAL | 3 refills | Status: DC

## 2021-07-20 MED ORDER — VALSARTAN 160 MG PO TABS: 160.0000 mg | ORAL_TABLET | Freq: Two times a day (BID) | ORAL | 3 refills | Status: DC

## 2021-07-20 NOTE — Patient Instructions (Signed)
Medication Instructions:  INCREASE VALSARTAN 160 TWICE DAILY  INCREASE AMLODIPINE 5MG  DAILY  *If you need a refill on your cardiac medications before your next appointment, please call your pharmacy*  Lab Work: BMET IN 1 WEEK If you have labs (blood work) drawn today and your tests are completely normal, you will receive your results only by:  [place] (if you have MyChart) OR A paper copy in the mail.  If you have any lab test that is abnormal or we need to change your treatment, we will call you to review the results. You may go to any [facility] that is convenient for you however, we do have a lab in our office that is able to assist you. You DO NOT need an appointment for our lab. The lab is open 8:00am and closes at 4:00pm. Lunch 12:45 - 1:45pm.  Special Instructions TAKE AND LOG YOUR BLOOD PRESSURE 1 HOUR AFTER TAKING YOUR MEDICATION, BRING LOG WITH YOU TO FOLLOW UP APPOINTMENT FOR REVIEW  PLEASE READ AND FOLLOW SALTY 6-ATTACHED-1,800 mg daily  Follow-Up: Your next appointment:  1 month(s) In Person with K. Mali Hilty, MD   At [facility], you and your health needs are our priority.  As part of our continuing mission to provide you with exceptional heart care, we have created designated Provider Care Teams.  These Care Teams include your primary Cardiologist (physician) and Advanced Practice Providers (APPs -  Physician Assistants and Nurse Practitioners) who all work together to provide you with the care you need, when you need it.

## 2021-08-12 DIAGNOSIS — Y842 Radiological procedure and radiotherapy as the cause of abnormal reaction of the patient, or of later complication, without mention of misadventure at the time of the procedure: Secondary | ICD-10-CM | POA: Diagnosis not present

## 2021-08-12 DIAGNOSIS — M272 Inflammatory conditions of jaws: Secondary | ICD-10-CM | POA: Diagnosis not present

## 2021-08-31 ENCOUNTER — Telehealth: Payer: PPO | Admitting: Family Medicine

## 2021-09-01 ENCOUNTER — Other Ambulatory Visit: Payer: Self-pay | Admitting: Adult Health

## 2021-09-01 DIAGNOSIS — Z951 Presence of aortocoronary bypass graft: Secondary | ICD-10-CM

## 2021-09-02 NOTE — Progress Notes (Incomplete)
[facility]  [address] [place],  [place]  [address] [phone]  Clinic Day:  09/15/2021  Referring physician: Dorothyann Peng, NP  This document serves as a record of services personally performed by Marice Potter, MD. It was created on their behalf by Curry,Lauren E, a trained medical scribe. The creation of this record is based on the scribe's personal observations and the provider's statements to them.  HISTORY OF PRESENT ILLNESS:  The patient is a 75 y.o. 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  The patient is on Lupron.  Of note, he was also going to start apalutamide for complete androgen blockade, but ultimately decided against taking this medication because it poses a slightly increased risk of seizures.  This patient has a remote history of a seizure disorder; however, he has not had one in decades.  He comes in today to reassess his PSA.  Since his last visit, the patient as bas been doing okay.  He continues to deny having any systemic symptoms which concern him for overt signs of disease progression.    PHYSICAL EXAM:  There were no vitals taken for this visit. Wt Readings from Last 3 Encounters:  07/20/21 201 lb 6.4 oz (91.4 kg)  07/12/21 203 lb 14.4 oz (92.5 kg)  06/11/21 198 lb 12.8 oz (90.2 kg)   There is no height or weight on file to calculate BMI. Performance status (ECOG): 1 Physical Exam Constitutional:      Appearance: Normal appearance. He is not ill-appearing.  HENT:     Mouth/Throat:     Mouth: Mucous membranes are moist.     Pharynx: Oropharynx is clear. No oropharyngeal exudate or posterior oropharyngeal erythema.  Cardiovascular:     Rate and Rhythm: Normal rate and regular rhythm.     Heart sounds: No murmur heard.   No friction rub. No gallop.  Pulmonary:     Effort: Pulmonary effort is normal. No respiratory distress.     Breath sounds: Normal breath sounds. No  wheezing, rhonchi or rales.  Abdominal:     General: Bowel sounds are normal. There is no distension.     Palpations: Abdomen is soft. There is no mass.     Tenderness: There is no abdominal tenderness.  Musculoskeletal:        General: No swelling.     Right lower leg: No edema.     Left lower leg: No edema.  Lymphadenopathy:     Cervical: No cervical adenopathy.     Upper Body:     Right upper body: No supraclavicular or axillary adenopathy.     Left upper body: No supraclavicular or axillary adenopathy.     Lower Body: No right inguinal adenopathy. No left inguinal adenopathy.  Skin:    General: Skin is warm.     Coloration: Skin is not jaundiced.     Findings: No lesion or rash.  Neurological:     General: No focal deficit present.     Mental Status: He is alert and oriented to person, place, and time. Mental status is at baseline.  Psychiatric:        Mood and Affect: Mood normal.        Behavior: Behavior normal.        Thought Content: Thought content normal.    LABS:    Ref. Range 03/01/2021 00:00 04/12/2021 00:00 04/12/2021 13:09 06/08/2021 14:15 07/09/2021 00:00 07/09/2021 14:29  Prostate Specific  Ag, Serum Latest Ref Range: 0.0 - 4.0 ng/mL   2.5 3.7    Prostatic Specific Antigen Latest Ref Range: 0.00 - 4.00 ng/mL      7.96 (H)       ASSESSMENT & PLAN:  Assessment/Plan:  A 75 y.o. 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  His PSA level has more than doubled over the past month.  This suggests androgen deprivation therapy is not enough to keep his prostate cancer under control.  I will once again talk to the patient about adding an antiandrogen to get his metastatic prostate cancer under control.  I will see him back in 2 months to reassess his disease.  The patient understands all the plans discussed today and is in agreement with them.    I, Rita Ohara, am acting as scribe for Marice Potter, MD    I have reviewed this  report as typed by the medical scribe, and it is complete and accurate.  Dequincy Macarthur Critchley, MD

## 2021-09-08 ENCOUNTER — Encounter: Payer: Self-pay | Admitting: Oncology

## 2021-09-08 ENCOUNTER — Inpatient Hospital Stay: Payer: PPO | Attending: Oncology

## 2021-09-08 ENCOUNTER — Other Ambulatory Visit: Payer: Self-pay

## 2021-09-08 DIAGNOSIS — Z5111 Encounter for antineoplastic chemotherapy: Secondary | ICD-10-CM | POA: Insufficient documentation

## 2021-09-08 DIAGNOSIS — C7951 Secondary malignant neoplasm of bone: Secondary | ICD-10-CM | POA: Insufficient documentation

## 2021-09-08 DIAGNOSIS — C61 Malignant neoplasm of prostate: Secondary | ICD-10-CM | POA: Diagnosis not present

## 2021-09-08 DIAGNOSIS — D649 Anemia, unspecified: Secondary | ICD-10-CM | POA: Diagnosis not present

## 2021-09-08 LAB — BASIC METABOLIC PANEL
BUN: 26 — AB (ref 4–21)
CO2: 25 — AB (ref 13–22)
Chloride: 93 — AB (ref 99–108)
Creatinine: 1.2 (ref 0.6–1.3)
Glucose: 115
Potassium: 4.5 (ref 3.4–5.3)
Sodium: 126 — AB (ref 137–147)

## 2021-09-08 LAB — CBC: RBC: 3.94 (ref 3.87–5.11)

## 2021-09-08 LAB — COMPREHENSIVE METABOLIC PANEL
Albumin: 4.2 (ref 3.5–5.0)
Calcium: 8.6 — AB (ref 8.7–10.7)

## 2021-09-08 LAB — CBC AND DIFFERENTIAL
HCT: 36 — AB (ref 41–53)
Hemoglobin: 12.4 — AB (ref 13.5–17.5)
Neutrophils Absolute: 3.76
Platelets: 230 (ref 150–399)
WBC: 6.6

## 2021-09-08 LAB — HEPATIC FUNCTION PANEL
ALT: 21 (ref 10–40)
AST: 33 (ref 14–40)
Alkaline Phosphatase: 103 (ref 25–125)
Bilirubin, Total: 0.5

## 2021-09-08 LAB — PSA: Prostatic Specific Antigen: 16.65 ng/mL — ABNORMAL HIGH (ref 0.00–4.00)

## 2021-09-09 ENCOUNTER — Ambulatory Visit: Payer: PPO | Admitting: Oncology

## 2021-09-09 ENCOUNTER — Ambulatory Visit: Payer: PPO

## 2021-09-09 ENCOUNTER — Inpatient Hospital Stay: Payer: PPO

## 2021-09-10 ENCOUNTER — Telehealth: Payer: Self-pay | Admitting: Oncology

## 2021-09-10 NOTE — Telephone Encounter (Signed)
09/10/21 spoke with patient and rescheduled missed appt

## 2021-09-13 ENCOUNTER — Other Ambulatory Visit: Payer: Self-pay | Admitting: Pharmacist

## 2021-09-13 NOTE — Progress Notes (Signed)
[facility]  [address] [place],  [place]  [address] [phone]  Clinic Day:  09/14/2021  Referring physician: Dorothyann Peng, NP  This document serves as a record of services personally performed by Marice Potter, MD. It was created on their behalf by Curry,Lauren E, a trained medical scribe. The creation of this record is based on the scribe's personal observations and the provider's statements to them.  HISTORY OF PRESENT ILLNESS:  The patient is a 75 y.o. 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  The patient is on Lupron.  Of note, he was also going to start apalutamide for complete androgen blockade, but ultimately decided against taking this medication because it posed a slightly increased risk of seizures.  This patient has a remote history of a seizure disorder; however, he has not had one in decades.  He comes in today to reassess his PSA.  Since his last visit, the patient as bas been doing okay.  He continues to deny having any systemic symptoms which concern him for overt signs of disease progression.    PHYSICAL EXAM:  Blood pressure (!) 144/71, pulse 83, temperature 98 F (36.7 C), resp. rate 16, height 5\' 11"  (1.803 m), weight 201 lb 12.8 oz (91.5 kg), SpO2 96 %. Wt Readings from Last 3 Encounters:  09/14/21 201 lb 12.8 oz (91.5 kg)  07/20/21 201 lb 6.4 oz (91.4 kg)  07/12/21 203 lb 14.4 oz (92.5 kg)   Body mass index is 28.15 kg/m. Performance status (ECOG): 1 Physical Exam Constitutional:      Appearance: Normal appearance. He is not ill-appearing.  HENT:     Mouth/Throat:     Mouth: Mucous membranes are moist.     Pharynx: Oropharynx is clear. No oropharyngeal exudate or posterior oropharyngeal erythema.  Cardiovascular:     Rate and Rhythm: Normal rate and regular rhythm.     Heart sounds: No murmur heard.   No friction rub. No gallop.  Pulmonary:     Effort: Pulmonary effort  is normal. No respiratory distress.     Breath sounds: Normal breath sounds. No wheezing, rhonchi or rales.  Abdominal:     General: Bowel sounds are normal. There is no distension.     Palpations: Abdomen is soft. There is no mass.     Tenderness: There is no abdominal tenderness.  Musculoskeletal:        General: No swelling.     Right lower leg: No edema.     Left lower leg: No edema.  Lymphadenopathy:     Cervical: No cervical adenopathy.     Upper Body:     Right upper body: No supraclavicular or axillary adenopathy.     Left upper body: No supraclavicular or axillary adenopathy.     Lower Body: No right inguinal adenopathy. No left inguinal adenopathy.  Skin:    General: Skin is warm.     Coloration: Skin is not jaundiced.     Findings: No lesion or rash.  Neurological:     General: No focal deficit present.     Mental Status: He is alert and oriented to person, place, and time. Mental status is at baseline.  Psychiatric:        Mood and Affect: Mood normal.        Behavior: Behavior normal.        Thought Content: Thought content normal.    LABS:    Latest  Reference Range & Units 07/09/21 14:29 09/08/21 14:31  Prostatic Specific Antigen 0.00 - 4.00 ng/mL 7.96 (H) 16.65 (H)    Ref. Range 04/12/2021 13:09 06/08/2021 14:15 07/09/2021 14:29   Prostate Specific Ag, Serum Latest Ref Range: 0.0 - 4.0 ng/mL 2.5 3.7    Prostatic Specific Antigen Latest Ref Range: 0.00 - 4.00 ng/mL   7.96 (H)        ASSESSMENT & PLAN:  Assessment/Plan:  A 75 y.o. 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  His PSA level has once again more than doubled over the past 2 month.  He understands his androgen deprivation therapy is not enough to keep his prostate cancer under control.  After looking at his labs, he is also resolved.  Thank you 1 complete androgen blockade therapy.  In the past, he was taking Lupron/Casodex.  I will now place him on  Lupron/enzalutamide.  Enzalutamide will be given at 160 mg daily.  I will see him back in 6 weeks to reassess his PSA level to see how well the reinitiation of his complete androgen blockade therapy has led to improvement in his metastatic prostate cancer.  The patient understands all the plans discussed today and is in agreement with them.    I, Rita Ohara, am acting as scribe for Marice Potter, MD    I have reviewed this report as typed by the medical scribe, and it is complete and accurate.  Jakai Risse Macarthur Critchley, MD

## 2021-09-14 ENCOUNTER — Inpatient Hospital Stay (INDEPENDENT_AMBULATORY_CARE_PROVIDER_SITE_OTHER): Payer: PPO | Admitting: Oncology

## 2021-09-14 ENCOUNTER — Inpatient Hospital Stay: Payer: PPO

## 2021-09-14 ENCOUNTER — Other Ambulatory Visit: Payer: Self-pay

## 2021-09-14 ENCOUNTER — Other Ambulatory Visit: Payer: Self-pay | Admitting: Oncology

## 2021-09-14 VITALS — BP 144/71 | HR 83 | Temp 98.0°F | Resp 16 | Ht 71.0 in | Wt 201.8 lb

## 2021-09-14 VITALS — BP 132/71 | HR 77 | Temp 98.8°F | Resp 18 | Ht 71.0 in | Wt 200.2 lb

## 2021-09-14 DIAGNOSIS — C61 Malignant neoplasm of prostate: Secondary | ICD-10-CM

## 2021-09-14 DIAGNOSIS — Z5111 Encounter for antineoplastic chemotherapy: Secondary | ICD-10-CM | POA: Diagnosis not present

## 2021-09-14 MED ORDER — LEUPROLIDE ACETATE (3 MONTH) 22.5 MG IM KIT
22.5000 mg | PACK | Freq: Once | INTRAMUSCULAR | Status: AC
  Administered 2021-09-14: 22.5 mg via INTRAMUSCULAR
  Filled 2021-09-14: qty 22.5

## 2021-09-14 NOTE — Patient Instructions (Signed)
Leuprolide injection What is this medication? LEUPROLIDE (loo PROE lide) is a man-made hormone. It is used to treat the symptoms of prostate cancer. This medicine may also be used to treat children with early onset of puberty. It may be used for other hormonal conditions. This medicine may be used for other purposes; ask your health care provider or pharmacist if you have questions. COMMON BRAND NAME(S): Lupron What should I tell my care team before I take this medication? They need to know if you have any of these conditions: diabetes heart disease or previous heart attack high blood pressure high cholesterol pain or difficulty passing urine spinal cord metastasis stroke tobacco smoker an unusual or allergic reaction to leuprolide, benzyl alcohol, other medicines, foods, dyes, or preservatives pregnant or trying to get pregnant breast-feeding How should I use this medication? This medicine is for injection under the skin or into a muscle. You will be taught how to prepare and give this medicine. Use exactly as directed. Take your medicine at regular intervals. Do not take your medicine more often than directed. It is important that you put your used needles and syringes in a special sharps container. Do not put them in a trash can. If you do not have a sharps container, call your pharmacist or healthcare provider to get one. A special MedGuide will be given to you by the pharmacist with each prescription and refill. Be sure to read this information carefully each time. Talk to your pediatrician regarding the use of this medicine in children. While this medicine may be prescribed for children as young as 8 years for selected conditions, precautions do apply. Overdosage: If you think you have taken too much of this medicine contact a poison control center or emergency room at once. NOTE: This medicine is only for you. Do not share this medicine with others. What if I miss a dose? If you miss  a dose, take it as soon as you can. If it is almost time for your next dose, take only that dose. Do not take double or extra doses. What may interact with this medication? Do not take this medicine with any of the following medications: chasteberry cisapride dronedarone pimozide thioridazine This medicine may also interact with the following medications: herbal or dietary supplements, like black cohosh or DHEA male hormones, like estrogens or progestins and birth control pills, patches, rings, or injections male hormones, like testosterone other medicines that prolong the QT interval (abnormal heart rhythm) This list may not describe all possible interactions. Give your health care provider a list of all the medicines, herbs, non-prescription drugs, or dietary supplements you use. Also tell them if you smoke, drink alcohol, or use illegal drugs. Some items may interact with your medicine. What should I watch for while using this medication? Visit your doctor or health care professional for regular checks on your progress. During the first week, your symptoms may get worse, but then will improve as you continue your treatment. You may get hot flashes, increased bone pain, increased difficulty passing urine, or an aggravation of nerve symptoms. Discuss these effects with your doctor or health care professional, some of them may improve with continued use of this medicine. Male patients may experience a menstrual cycle or spotting during the first 2 months of therapy with this medicine. If this continues, contact your doctor or health care professional. This medicine may increase blood sugar. Ask your healthcare provider if changes in diet or medicines are needed if you have  diabetes. What side effects may I notice from receiving this medication? Side effects that you should report to your doctor or health care professional as soon as possible: allergic reactions like skin rash, itching or  hives, swelling of the face, lips, or tongue breathing problems chest pain depression or memory disorders pain in your legs or groin pain at site where injected severe headache signs and symptoms of high blood sugar such as being more thirsty or hungry or having to urinate more than normal. You may also feel very tired or have blurry vision swelling of the feet and legs visual changes vomiting Side effects that usually do not require medical attention (report to your doctor or health care professional if they continue or are bothersome): breast swelling or tenderness decrease in sex drive or performance diarrhea hot flashes loss of appetite muscle, joint, or bone pains nausea redness or irritation at site where injected skin problems or acne This list may not describe all possible side effects. Call your doctor for medical advice about side effects. You may report side effects to FDA at 1-800-FDA-1088. Where should I keep my medication? Keep out of the reach of children. Store below 25 degrees C (77 degrees F). Do not freeze. Protect from light. Do not use if it is not clear or if there are particles present. Throw away any unused medicine after the expiration date. NOTE: This sheet is a summary. It may not cover all possible information. If you have questions about this medicine, talk to your doctor, pharmacist, or health care provider.  2022 Elsevier/Gold Standard (2021-06-29 00:00:00)

## 2021-09-14 NOTE — Progress Notes (Signed)
1459:PT STABLE AT TIME OF DISCHARGE ?

## 2021-09-15 ENCOUNTER — Other Ambulatory Visit: Payer: Self-pay | Admitting: Oncology

## 2021-09-15 ENCOUNTER — Encounter: Payer: Self-pay | Admitting: Oncology

## 2021-09-15 ENCOUNTER — Telehealth: Payer: Self-pay

## 2021-09-15 ENCOUNTER — Telehealth: Payer: Self-pay | Admitting: Pharmacy Technician

## 2021-09-15 ENCOUNTER — Other Ambulatory Visit (HOSPITAL_COMMUNITY): Payer: Self-pay

## 2021-09-15 DIAGNOSIS — C61 Malignant neoplasm of prostate: Secondary | ICD-10-CM

## 2021-09-15 MED ORDER — ENZALUTAMIDE 40 MG PO TABS
160.0000 mg | ORAL_TABLET | Freq: Every day | ORAL | 5 refills | Status: DC
  Filled 2021-09-15: qty 120, 30d supply, fill #0

## 2021-09-15 MED ORDER — ENZALUTAMIDE 40 MG CAPS #124 ACORN MEDIVATION MDV3100-11
160.0000 mg | ORAL_CAPSULE | Freq: Every day | ORAL | 5 refills | Status: DC
  Filled 2021-09-15: qty 120, 30d supply, fill #0

## 2021-09-15 NOTE — Telephone Encounter (Signed)
Submitted a Prior Authorization request to [employer] for  Xtandi 40mg   via CoverMyMeds. Will update once we receive a response.   Key: [id number]

## 2021-09-15 NOTE — Telephone Encounter (Signed)
Oral Oncology Pharmacist Encounter  Received new prescription for enzalutamide Gillermina Phy) for the treatment of metastatic prostate cancer in conjunction with Lupron, planned duration until disease progression or unacceptable toxicity.  Labs from 09/08/21 assessed, no interventions needed.  Current medication list in Epic reviewed, DDIs with xtandi identified: - amlodipine (Cat C): monitor therapy for reduced efficacy of amlodipine due to xtandi being a strong 3A4 inducer.   Evaluated chart and no patient barriers to medication adherence noted.   Patient agreement for treatment documented in MD note on 09/14/2021.  Prescription has been e-scribed to the [facility] for benefits analysis and approval.  Oral Oncology Clinic will continue to follow for insurance authorization, copayment issues, initial counseling and start date.  Drema Halon, PharmD Hematology/Oncology Clinical Pharmacist [facility] [phone] 09/15/2021 8:33 AM

## 2021-09-15 NOTE — Telephone Encounter (Signed)
Submitted Patient Assistance Application to  [employer]   for  Connor [name]  along with provider portion. Will update patient when we receive a response.  Fax# [phone] Phone# [phone]

## 2021-09-15 NOTE — Telephone Encounter (Signed)
Received notification from [facility] regarding a prior authorization for  Xtandi 40mg  . Authorization has been APPROVED from 09/15/21 to 09/15/22.   Per test claim, copay for 30 days supply is $2,906.63   Authorization # (Key: [id number]) - [id number]  Will initiate patient assistance.

## 2021-09-15 NOTE — Telephone Encounter (Signed)
Received New start paperwork for  [facility] . Will update as we work through the benefits process.

## 2021-09-15 NOTE — Telephone Encounter (Signed)
No copay grants open at this time.  Called patient and discussed manufacturer patient assistance. He said he can stop by the office later today to sign. Emailed forms to office contacts- Denyse Amass and [place].

## 2021-09-22 NOTE — Telephone Encounter (Signed)
Received Asset form Xt[employer] for patient to complete and sign. Emailed to [place] at clinic. Called patient to advise. Emailed patient form also.

## 2021-09-22 NOTE — Telephone Encounter (Signed)
Received patient income form back and faxed into program. Will continue to follow status.  Phone# [phone]

## 2021-09-22 NOTE — Telephone Encounter (Signed)
Called [employer] Support to check status of patient's application. Per rep, they are scheduled to reach out to patient today to complete an "Asset Screening"   Will continue to follow up.  Phone# [phone]

## 2021-09-23 ENCOUNTER — Other Ambulatory Visit (HOSPITAL_COMMUNITY): Payer: Self-pay

## 2021-09-24 NOTE — Telephone Encounter (Signed)
Called [employer] support to follow up on patient's application. Rep advised they have received all needed documentation and are currently processing patient's approval. We should receive an approval letter soon and they will contact patient to set up shipment.

## 2021-09-24 NOTE — Telephone Encounter (Signed)
Received a fax from   [employer] support  regarding an approval for  [place]  patient assistance from 09/24/21 to 10/23/22. Medication will come from [place].  Phone number: [phone]  Called patient and advised of approval and provided pharmacy phone number and name. Will continue to follow to ensure 1st shipment.

## 2021-09-28 ENCOUNTER — Telehealth: Payer: Self-pay

## 2021-09-28 NOTE — Telephone Encounter (Addendum)
09/29/21 - Pt returned my call this morning. He just spoke with the pharmacy and scheduled his Gillermina Phy to be delivered 10/01/21. Pt plans to start the Xtandi that night.  09/28/21 - I attempted call to pt to see if he had received the Xtandi yet. I also wanted to get start date if done.

## 2021-09-29 ENCOUNTER — Encounter: Payer: Self-pay | Admitting: Oncology

## 2021-09-29 NOTE — Telephone Encounter (Signed)
Received notification that patient will receive 1st shipment on 12/9

## 2021-10-04 ENCOUNTER — Telehealth: Payer: Self-pay | Admitting: Diagnostic Neuroimaging

## 2021-10-04 NOTE — Telephone Encounter (Signed)
Oral Chemotherapy Pharmacist Encounter  I spoke with patient for overview of: Xtandi for the treatment of metastatic, castration-resistant prostate cancer in conjunction with lupron, planned duration until disease progression or unacceptable toxicity.   Counseled patient on administration, dosing, side effects, monitoring, drug-food interactions, safe handling, storage, and disposal.  Patient will take Xtandi 40mg  capsules, 4 capsules (160mg ) by mouth once daily without regard to food.  Xtandi start date: patient wants to wait until he speaks with his neurologist since he has the history of seizures  Patient is calling his neurologist today to schedule an appointment and then will start the medication.   Adverse effects include but are not limited to: peripheral edema, GI upset, hypertension, hot flashes, fatigue, falls/fractures, and arthralgias.   Patient instructed about small risk of seizures with Xtandi treatment.  Reviewed with patient importance of keeping a medication schedule and plan for any missed doses. No barriers to medication adherence identified.  Medication reconciliation performed and medication/allergy list updated.  Insurance authorization for Gillermina Phy has been obtained. Patient receives medication through patient assistance program.   All questions answered.  Connor [name] voiced understanding and appreciation.   Medication education handout placed in mail for patient. Patient knows to call the office with questions or concerns. Oral Chemotherapy Clinic phone number provided to patient.   Drema Halon, PharmD Hematology/Oncology Clinical Pharmacist [facility] [phone]

## 2021-10-04 NOTE — Telephone Encounter (Signed)
Suggestions?

## 2021-10-04 NOTE — Telephone Encounter (Signed)
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 Pt is wanting to discuss if he should take this medication or increase his :lamoTRIgine (LAMICTAL) 100 MG tablet please call.

## 2021-10-04 NOTE — Telephone Encounter (Signed)
Contacted pt, informed him of Dr [name] recommendations. He was appreciative and hoping new medication doesn't cause seizures. Advised to call us with any complications.

## 2021-10-05 ENCOUNTER — Telehealth: Payer: Self-pay

## 2021-10-05 NOTE — Telephone Encounter (Addendum)
10/07/21 -Clema Skousen,RN: I spoke with pt. He states he started the [place] on Monday. He takes in the afternoon, 30 minutes after eating. The Xtandi are tablets, and the pt has a hard time swallowing them (due to past medical hx). He is inquiring if he can crush or dissolve the tablets? He states he has a small pocket in the back of his throat and tabs get stuck there, he ends up coughing them up @ times. They are already somewhat dissolving then, so he doesn't understand why he couldn't crush or dissolve them. I told him to try taking the tablets in applesauce, yogurt or pudding. No missed doses. Afebrile. "I haven't really noticed anything different".  Pt did mention that he crushes his medication. I asked if he ever had a swallowing eval. He replied No. I told pt that I would notify the pharmacist about the swallowing of tabs.  Manuela Schwartz P,pharmacist: Per PI:  Swallow capsules or tablets whole. Do not chew, dissolve or open the capsules. Do not cut, crush, or chew the tablets. We may need to look at another option for him if he is unable to swallow whole. He's on several other meds...does he have trouble with those too? I will contact the manufacturer to see if they have additional studies on dissolving the tabs. Sometimes they may have additional info.  10/08/21 -Blaine Hari,RN: I spoke to pt, to ask if he crushes all his medications. He does crush all meds. I told him we may have to change to a different therapy per pharmacist. the manufacturer response is as listed below. He states, "I will continue taking them as best as I can. If I cant, I will call you".    Cutting, Crushing, or Chewing of Enzalutamide Tablets The recommended dose of XTANDI  (enzalutamide) tablets is 160 mg (two 80 mg tablets or  four 40 mg tablets) administered orally once daily.1 Enzalutamide tablets can be taken with or  without food. Swallow tablets whole. Do not cut, crush, or chew the tablets.  We have not studied how cutting, crushing, or  chewing the tablets might alter the  pharmacokinetics of enzalutamide. Therefore, we cannot recommend that you advise your patients to take the tablets via these alternative routes of administration.   [address] [employer]  [address], [place] [address]  [email or web address]   [phone]

## 2021-10-08 ENCOUNTER — Encounter: Payer: Self-pay | Admitting: Oncology

## 2021-10-11 ENCOUNTER — Telehealth: Payer: Self-pay

## 2021-10-11 NOTE — Telephone Encounter (Signed)
Pt doing well. He has been able to swallow the caps a little better. He denies N/V, rash, SOB, & diarrhea. No missed doses. I reminded him to call us if he develops a temp of 100.4 or higher, day or night. He verbalized understanding and thanked me for checking on him.

## 2021-10-20 NOTE — Progress Notes (Signed)
[facility]  [address] [place],  [place]  [address] [phone]  Clinic Day:  10/26/2021  Referring physician: Dorothyann Peng, NP  This document serves as a record of services personally performed by Marice Potter, MD. It was created on their behalf by Curry,Lauren E, a trained medical scribe. The creation of this record is based on the scribe's personal observations and the provider's statements to them.  HISTORY OF PRESENT ILLNESS:  The patient is a 75 y.o. 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  The patient is on Lupron/enzalutimide for complete androgen blockade.  His enzalutamide was recently started due to his PSA progressing while just on androgen deprivation therapy.  Over these past 6 weeks, the patient is claims to have tolerate his enzalutamide oral therapy very well.  He continues to deny having any bone pain or other systemic symptoms which concern him for overt signs of disease progression.    PHYSICAL EXAM:  Blood pressure (!) 172/79, pulse 64, temperature 98.2 F (36.8 C), resp. rate 18, height 5\' 11"  (1.803 m), weight 203 lb 11.2 oz (92.4 kg), SpO2 95 %. Wt Readings from Last 3 Encounters:  10/26/21 203 lb 11.2 oz (92.4 kg)  09/14/21 200 lb 4 oz (90.8 kg)  09/14/21 201 lb 12.8 oz (91.5 kg)   Body mass index is 28.41 kg/m. Performance status (ECOG): 1 Physical Exam Constitutional:      Appearance: Normal appearance. He is not ill-appearing.  HENT:     Mouth/Throat:     Mouth: Mucous membranes are moist.     Pharynx: Oropharynx is clear. No oropharyngeal exudate or posterior oropharyngeal erythema.  Cardiovascular:     Rate and Rhythm: Normal rate and regular rhythm.     Heart sounds: No murmur heard.   No friction rub. No gallop.  Pulmonary:     Effort: Pulmonary effort is normal. No respiratory distress.     Breath sounds: Normal breath sounds. No wheezing, rhonchi or rales.   Abdominal:     General: Bowel sounds are normal. There is no distension.     Palpations: Abdomen is soft. There is no mass.     Tenderness: There is no abdominal tenderness.  Musculoskeletal:        General: No swelling.     Right lower leg: No edema.     Left lower leg: No edema.  Lymphadenopathy:     Cervical: No cervical adenopathy.     Upper Body:     Right upper body: No supraclavicular or axillary adenopathy.     Left upper body: No supraclavicular or axillary adenopathy.     Lower Body: No right inguinal adenopathy. No left inguinal adenopathy.  Skin:    General: Skin is warm.     Coloration: Skin is not jaundiced.     Findings: No lesion or rash.  Neurological:     General: No focal deficit present.     Mental Status: He is alert and oriented to person, place, and time. Mental status is at baseline.  Psychiatric:        Mood and Affect: Mood normal.        Behavior: Behavior normal.        Thought Content: Thought content normal.    LABS:    Latest Reference Range & Units 09/08/21 14:31 10/22/21 13:47  Prostatic Specific Antigen 0.00 - 4.00 ng/mL 16.65 (H) 8.02 (H)  (H): Data is abnormally high  ASSESSMENT & PLAN:  Assessment/Plan:  A 76 y.o. 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  I am very pleased that this gentleman's PSA level has fallen by over 50% since his enzalutamide was started just 6 weeks ago.  This reflects the efficacy of his complete androgen blockade therapy.  Understandably, the patient was pleased with his results.  He will continue taking his Lupron/enzalutamide regimen until there becomes either evidence of disease progression or poor toleration of treatment.  Clinically, the patient is doing well.  I will see him back in late February 2023 for repeat clinical assessment.  The patient understands all the plans discussed today and is in agreement with them.    I, Rita Ohara, am acting as scribe for  Marice Potter, MD    I have reviewed this report as typed by the medical scribe, and it is complete and accurate.  Parveen Freehling Macarthur Critchley, MD

## 2021-10-22 ENCOUNTER — Other Ambulatory Visit: Payer: Self-pay | Admitting: Hematology and Oncology

## 2021-10-22 ENCOUNTER — Inpatient Hospital Stay: Payer: PPO | Attending: Oncology

## 2021-10-22 DIAGNOSIS — C61 Malignant neoplasm of prostate: Secondary | ICD-10-CM | POA: Insufficient documentation

## 2021-10-22 LAB — PSA: Prostatic Specific Antigen: 8.02 ng/mL — ABNORMAL HIGH (ref 0.00–4.00)

## 2021-10-22 LAB — BASIC METABOLIC PANEL
BUN: 24 — AB (ref 4–21)
CO2: 26 — AB (ref 13–22)
Chloride: 95 — AB (ref 99–108)
Creatinine: 0.9 (ref 0.6–1.3)
Glucose: 101
Potassium: 4.7 (ref 3.4–5.3)
Sodium: 128 — AB (ref 137–147)

## 2021-10-22 LAB — HEPATIC FUNCTION PANEL
ALT: 19 (ref 10–40)
AST: 31 (ref 14–40)
Alkaline Phosphatase: 99 (ref 25–125)
Bilirubin, Total: 0.6

## 2021-10-22 LAB — CBC AND DIFFERENTIAL
HCT: 35 — AB (ref 41–53)
Hemoglobin: 12 — AB (ref 13.5–17.5)
Neutrophils Absolute: 3.03
Platelets: 242 (ref 150–399)
WBC: 5.5

## 2021-10-22 LAB — COMPREHENSIVE METABOLIC PANEL
Albumin: 4.2 (ref 3.5–5.0)
Calcium: 9.2 (ref 8.7–10.7)

## 2021-10-22 LAB — CBC
MCV: 89 (ref 80–94)
RBC: 3.93 (ref 3.87–5.11)

## 2021-10-26 ENCOUNTER — Other Ambulatory Visit: Payer: Self-pay | Admitting: Oncology

## 2021-10-26 ENCOUNTER — Other Ambulatory Visit: Payer: Self-pay | Admitting: Diagnostic Neuroimaging

## 2021-10-26 ENCOUNTER — Inpatient Hospital Stay: Payer: PPO | Attending: Oncology | Admitting: Oncology

## 2021-10-26 VITALS — BP 172/79 | HR 64 | Temp 98.2°F | Resp 18 | Ht 71.0 in | Wt 203.7 lb

## 2021-10-26 DIAGNOSIS — C61 Malignant neoplasm of prostate: Secondary | ICD-10-CM | POA: Diagnosis not present

## 2021-10-28 ENCOUNTER — Telehealth: Payer: Self-pay

## 2021-10-28 NOTE — Telephone Encounter (Signed)
I spoke with pt regarding Xtandi. Pt states, "So far, I cant tell a tremendous amount of difference from the other hormones I took". Pt denies N/V, diarrhea, & muscle/joint pain. He does report intermittent dull headache, "not enough to take medicine for"per pt. Pt denies missed doses. He takes the tabs @ 10-1030p nightly without food. He continues to place the 4 tabs in warm water to make them easier for him to swallow. Then he drinks more fluids behind them. "I know y'all told me not to dissolve them but that is the only way I can get them down. I told Dr Bobby Rumpf and he told me he didn't see a problem with it".  I told pt that myself and pharmacist were only going by the manufacturer instructions. I reminded pt to call us if he was to develop a temp of 100.4 or higher. Pt verbalized understanding. Pt saw Dr Bobby Rumpf yesterday in clinic and his PSA has reduced by 1/2 since starting the [facility]. I confirmed next appt date.

## 2021-10-29 ENCOUNTER — Other Ambulatory Visit: Payer: Self-pay | Admitting: General Practice

## 2021-11-01 ENCOUNTER — Other Ambulatory Visit (HOSPITAL_COMMUNITY): Payer: Self-pay

## 2021-11-09 ENCOUNTER — Telehealth: Payer: Self-pay

## 2021-11-09 NOTE — Telephone Encounter (Signed)
I spoke with pt to see how he was doing. He states, "I'm getting by. I still cant really tell any difference, other than I maybe a little more tired".  Pt denies missed doses. He is taking the Xtandi 40mg  4 tabs po every night between 10p-1030p without food. He is still dissolving the Xtandi in small amt of water to get them down. "The good doctor knows about it. It's the only way I can get them down". I reminded the pt to call us with any questions. He verbalized understanding.

## 2021-12-08 NOTE — Progress Notes (Signed)
[name]  [address] [place],  [place]  [address] [phone]  Clinic Day:  12/15/2021  Referring physician: Dorothyann Peng, NP  This document serves as a record of services personally performed by Marice Potter, MD. It was created on their behalf by Curry,Lauren E, a trained medical scribe. The creation of this record is based on the scribe's personal observations and the provider's statements to them.  HISTORY OF PRESENT ILLNESS:  The patient is a 76 y.o. 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  The patient is on Lupron/enzalutimide for complete androgen blockade.  His enzalutamide was recently started due to his PSA progressing while just on androgen deprivation therapy.  The patient claims to still be tolerating his oral enzalutamide therapy very well.  He continues to deny having any bone pain or other systemic symptoms which concern him for overt signs of disease progression.    PHYSICAL EXAM:  Blood pressure (!) 159/84, pulse 74, temperature 98.2 F (36.8 C), resp. rate 18, height 5\' 11"  (1.803 m), weight 199 lb 12.8 oz (90.6 kg), SpO2 96 %. Wt Readings from Last 3 Encounters:  12/15/21 199 lb 12.8 oz (90.6 kg)  10/26/21 203 lb 11.2 oz (92.4 kg)  09/14/21 200 lb 4 oz (90.8 kg)   Body mass index is 27.87 kg/m. Performance status (ECOG): 1 Physical Exam Constitutional:      Appearance: Normal appearance. He is not ill-appearing.  HENT:     Mouth/Throat:     Mouth: Mucous membranes are moist.     Pharynx: Oropharynx is clear. No oropharyngeal exudate or posterior oropharyngeal erythema.  Cardiovascular:     Rate and Rhythm: Normal rate and regular rhythm.     Heart sounds: No murmur heard.   No friction rub. No gallop.  Pulmonary:     Effort: Pulmonary effort is normal. No respiratory distress.     Breath sounds: Normal breath sounds. No wheezing, rhonchi or rales.  Abdominal:      General: Bowel sounds are normal. There is no distension.     Palpations: Abdomen is soft. There is no mass.     Tenderness: There is no abdominal tenderness.  Musculoskeletal:        General: No swelling.     Right lower leg: No edema.     Left lower leg: No edema.  Lymphadenopathy:     Cervical: No cervical adenopathy.     Upper Body:     Right upper body: No supraclavicular or axillary adenopathy.     Left upper body: No supraclavicular or axillary adenopathy.     Lower Body: No right inguinal adenopathy. No left inguinal adenopathy.  Skin:    General: Skin is warm.     Coloration: Skin is not jaundiced.     Findings: No lesion or rash.  Neurological:     General: No focal deficit present.     Mental Status: He is alert and oriented to person, place, and time. Mental status is at baseline.  Psychiatric:        Mood and Affect: Mood normal.        Behavior: Behavior normal.        Thought Content: Thought content normal.    LABS:    Latest Reference Range & Units 09/08/21 14:31 10/22/21 13:47 12/14/21 13:58  Prostatic Specific Antigen 0.00 - 4.00 ng/mL 16.65 (H) 8.02 (H) 7.88 (H)    ASSESSMENT & PLAN:  Assessment/Plan:  A 76 y.o. 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  I am pleased with the continued drop in his PSA level, which has fallen by over 50% since his enzalutamide was started 3 months ago.  He will continue taking his Lupron/enzalutamide regimen until there becomes either evidence of disease progression or poor tolerance of treatment.  Clinically, the patient is doing well.  I will see him back in 3 months for repeat clinical assessment.  The patient understands all the plans discussed today and is in agreement with them.    I, Rita Ohara, am acting as scribe for Marice Potter, MD    I have reviewed this report as typed by the medical scribe, and it is complete and accurate.  Aviella Disbrow Macarthur Critchley, MD

## 2021-12-09 ENCOUNTER — Telehealth: Payer: Self-pay

## 2021-12-09 NOTE — Telephone Encounter (Addendum)
I spoke with pt regarding his tolerance to taking the Xtandi. Pt states he is doing alright. The only thing he has noticed is he is more tired. He continues to take all 4 tabs @ 10p- 1030p without food each night. No missed doses. He denies N/V, diarrhea, mouth sores, headache, rashes/itching and fevers. I confirmed next appt date and time.

## 2021-12-14 ENCOUNTER — Other Ambulatory Visit: Payer: Self-pay | Admitting: Hematology and Oncology

## 2021-12-14 ENCOUNTER — Other Ambulatory Visit: Payer: Self-pay

## 2021-12-14 ENCOUNTER — Inpatient Hospital Stay: Payer: PPO | Attending: Oncology

## 2021-12-14 DIAGNOSIS — Z5111 Encounter for antineoplastic chemotherapy: Secondary | ICD-10-CM | POA: Diagnosis not present

## 2021-12-14 DIAGNOSIS — C7951 Secondary malignant neoplasm of bone: Secondary | ICD-10-CM | POA: Insufficient documentation

## 2021-12-14 DIAGNOSIS — C61 Malignant neoplasm of prostate: Secondary | ICD-10-CM | POA: Diagnosis present

## 2021-12-14 LAB — BASIC METABOLIC PANEL
BUN: 19 (ref 4–21)
CO2: 28 — AB (ref 13–22)
Chloride: 96 — AB (ref 99–108)
Creatinine: 1 (ref 0.6–1.3)
Glucose: 105
Potassium: 4.4 (ref 3.4–5.3)
Sodium: 131 — AB (ref 137–147)

## 2021-12-14 LAB — CBC AND DIFFERENTIAL
HCT: 37 — AB (ref 41–53)
Hemoglobin: 12.9 — AB (ref 13.5–17.5)
Neutrophils Absolute: 2.92
Platelets: 226 (ref 150–399)
WBC: 5.4

## 2021-12-14 LAB — HEPATIC FUNCTION PANEL
ALT: 17 (ref 10–40)
AST: 32 (ref 14–40)
Alkaline Phosphatase: 81 (ref 25–125)
Bilirubin, Total: 0.5

## 2021-12-14 LAB — COMPREHENSIVE METABOLIC PANEL
Albumin: 4.1 (ref 3.5–5.0)
Calcium: 9 (ref 8.7–10.7)

## 2021-12-14 LAB — CBC
MCV: 89 (ref 80–94)
RBC: 4.14 (ref 3.87–5.11)

## 2021-12-14 LAB — PSA: Prostatic Specific Antigen: 7.88 ng/mL — ABNORMAL HIGH (ref 0.00–4.00)

## 2021-12-15 ENCOUNTER — Inpatient Hospital Stay: Payer: PPO

## 2021-12-15 ENCOUNTER — Ambulatory Visit: Payer: PPO

## 2021-12-15 ENCOUNTER — Inpatient Hospital Stay: Payer: PPO | Admitting: Oncology

## 2021-12-15 VITALS — BP 161/74 | HR 77 | Temp 98.1°F | Resp 18 | Ht 71.0 in | Wt 199.0 lb

## 2021-12-15 VITALS — BP 159/84 | HR 74 | Temp 98.2°F | Resp 18 | Ht 71.0 in | Wt 199.8 lb

## 2021-12-15 DIAGNOSIS — Z5111 Encounter for antineoplastic chemotherapy: Secondary | ICD-10-CM | POA: Diagnosis not present

## 2021-12-15 DIAGNOSIS — C61 Malignant neoplasm of prostate: Secondary | ICD-10-CM

## 2021-12-15 LAB — TESTOSTERONE: Testosterone: 7 ng/dL — ABNORMAL LOW (ref 264–916)

## 2021-12-15 MED ORDER — LEUPROLIDE ACETATE (3 MONTH) 22.5 MG IM KIT
22.5000 mg | PACK | Freq: Once | INTRAMUSCULAR | Status: AC
  Administered 2021-12-15: 22.5 mg via INTRAMUSCULAR
  Filled 2021-12-15: qty 22.5

## 2021-12-15 NOTE — Patient Instructions (Signed)
Leuprolide depot injection What is this medication? LEUPROLIDE (loo PROE lide) is a man-made protein that acts like a natural hormone in the body. It decreases testosterone in men and decreases estrogen in women. In men, this medicine is used to treat advanced prostate cancer. In women, some forms of this medicine may be used to treat endometriosis, uterine fibroids, or other male hormone-related problems. This medicine may be used for other purposes; ask your health care provider or pharmacist if you have questions. COMMON BRAND NAME(S): Eligard, Fensolv, Lupron Depot, Lupron Depot-Ped, Viadur What should I tell my care team before I take this medication? They need to know if you have any of these conditions: diabetes heart disease or previous heart attack high blood pressure high cholesterol mental illness osteoporosis pain or difficulty passing urine seizures spinal cord metastasis stroke suicidal thoughts, plans, or attempt; a previous suicide attempt by you or a family member tobacco smoker unusual vaginal bleeding (women) an unusual or allergic reaction to leuprolide, benzyl alcohol, other medicines, foods, dyes, or preservatives pregnant or trying to get pregnant breast-feeding How should I use this medication? This medicine is for injection into a muscle or for injection under the skin. It is given by a health care professional in a hospital or clinic setting. The specific product will determine how it will be given to you. Make sure you understand which product you receive and how often you will receive it. Talk to your pediatrician regarding the use of this medicine in children. Special care may be needed. Overdosage: If you think you have taken too much of this medicine contact a poison control center or emergency room at once. NOTE: This medicine is only for you. Do not share this medicine with others. What if I miss a dose? It is important not to miss a dose. Call your  doctor or health care professional if you are unable to keep an appointment. Depot injections: Depot injections are given either once-monthly, every 12 weeks, every 16 weeks, or every 24 weeks depending on the product you are prescribed. The product you are prescribed will be based on if you are male or male, and your condition. Make sure you understand your product and dosing. What may interact with this medication? Do not take this medicine with any of the following medications: chasteberry cisapride dronedarone pimozide thioridazine This medicine may also interact with the following medications: herbal or dietary supplements, like black cohosh or DHEA male hormones, like estrogens or progestins and birth control pills, patches, rings, or injections male hormones, like testosterone other medicines that prolong the QT interval (abnormal heart rhythm) This list may not describe all possible interactions. Give your health care provider a list of all the medicines, herbs, non-prescription drugs, or dietary supplements you use. Also tell them if you smoke, drink alcohol, or use illegal drugs. Some items may interact with your medicine. What should I watch for while using this medication? Visit your doctor or health care professional for regular checks on your progress. During the first weeks of treatment, your symptoms may get worse, but then will improve as you continue your treatment. You may get hot flashes, increased bone pain, increased difficulty passing urine, or an aggravation of nerve symptoms. Discuss these effects with your doctor or health care professional, some of them may improve with continued use of this medicine. Male patients may experience a menstrual cycle or spotting during the first months of therapy with this medicine. If this continues, contact your doctor or  health care professional. This medicine may increase blood sugar. Ask your healthcare provider if changes in diet  or medicines are needed if you have diabetes. What side effects may I notice from receiving this medication? Side effects that you should report to your doctor or health care professional as soon as possible: allergic reactions like skin rash, itching or hives, swelling of the face, lips, or tongue breathing problems chest pain depression or memory disorders pain in your legs or groin pain at site where injected or implanted seizures severe headache signs and symptoms of high blood sugar such as being more thirsty or hungry or having to urinate more than normal. You may also feel very tired or have blurry vision swelling of the feet and legs suicidal thoughts or other mood changes visual changes vomiting Side effects that usually do not require medical attention (report to your doctor or health care professional if they continue or are bothersome): breast swelling or tenderness decrease in sex drive or performance diarrhea hot flashes loss of appetite muscle, joint, or bone pains nausea redness or irritation at site where injected or implanted skin problems or acne This list may not describe all possible side effects. Call your doctor for medical advice about side effects. You may report side effects to FDA at 1-800-FDA-1088. Where should I keep my medication? This drug is given in a hospital or clinic and will not be stored at home. NOTE: This sheet is a summary. It may not cover all possible information. If you have questions about this medicine, talk to your doctor, pharmacist, or health care provider.  2022 [employer]vier/Gold Standard (2021-06-29 00:00:00)

## 2022-01-04 ENCOUNTER — Other Ambulatory Visit: Payer: Self-pay | Admitting: Adult Health

## 2022-01-25 ENCOUNTER — Other Ambulatory Visit: Payer: Self-pay | Admitting: Diagnostic Neuroimaging

## 2022-01-27 ENCOUNTER — Telehealth: Payer: Self-pay | Admitting: Diagnostic Neuroimaging

## 2022-01-27 ENCOUNTER — Other Ambulatory Visit: Payer: Self-pay | Admitting: *Deleted

## 2022-01-27 MED ORDER — LAMOTRIGINE 100 MG PO TABS: ORAL_TABLET | ORAL | 1 refills | Status: DC

## 2022-01-27 NOTE — Telephone Encounter (Signed)
Pt is requesting a refill for lamoTRIgine (LAMICTAL) 100 MG tablet. ? ?Pharmacy: [facility] [phone]  ? ?

## 2022-01-27 NOTE — Telephone Encounter (Signed)
Refilled until FU.  ?

## 2022-02-13 ENCOUNTER — Other Ambulatory Visit: Payer: Self-pay | Admitting: General Practice

## 2022-03-10 ENCOUNTER — Other Ambulatory Visit: Payer: Self-pay | Admitting: Hematology and Oncology

## 2022-03-10 ENCOUNTER — Telehealth: Payer: Self-pay | Admitting: Adult Health

## 2022-03-10 DIAGNOSIS — C61 Malignant neoplasm of prostate: Secondary | ICD-10-CM

## 2022-03-10 NOTE — Progress Notes (Unsigned)
Connor [name]  [address] [place],  [place]  [address] [phone]  Clinic Day:  03/11/2022  Referring physician: Dorothyann Peng, NP   HISTORY OF PRESENT ILLNESS:  The patient is a 76 y.o. 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  The patient is on Lupron/enzalutimide for complete androgen blockade.  Enzalutamide was recently started in November 2022 due to his PSA progressing while just on androgen deprivation therapy.  The patient claims to still be tolerating his oral enzalutamide therapy well.  His only complaint is fatigue.  He continues to deny having any bone pain or other systemic symptoms which concern him for overt signs of disease progression.   He has not had his blood pressure medication yet today.   PHYSICAL EXAM:  Blood pressure (!) 193/91, pulse 72, temperature 97.6 F (36.4 C), resp. rate 16, height '5\' 11"'$  (1.803 m), weight 195 lb 11.2 oz (88.8 kg), SpO2 98 %. Wt Readings from Last 3 Encounters:  03/11/22 195 lb 11.2 oz (88.8 kg)  12/15/21 199 lb (90.3 kg)  12/15/21 199 lb 12.8 oz (90.6 kg)   Body mass index is 27.29 kg/m. Performance status (ECOG): 1 - Symptomatic but completely ambulatory Physical Exam  LABS:      Latest Ref Rng & Units 03/11/2022   12:00 AM 12/14/2021   12:00 AM 10/22/2021   12:00 AM  CBC  WBC  5.1      5.4      5.5       Hemoglobin 13.5 - 17.5 13.1      12.9      12.0       Hematocrit 41 - 53 39      37      35       Platelets 150 - 400 K/uL 225      226      242          This result is from an external source.      Latest Ref Rng & Units 12/14/2021   12:00 AM 10/22/2021   12:00 AM 09/08/2021   12:00 AM  CMP  BUN 4 - '21 19      24      26    '$ Creatinine 0.6 - 1.3 1.0      0.9      1.2    Sodium 137 - 147 131      128      126    Potassium 3.4 - 5.3 4.4      4.7      4.5    Chloride 99 - 108 96      95      93    CO2 13 - '22 28      26      25     '$ Calcium 8.7 - 10.7 9.0      9.2      8.6    Alkaline Phos 25 - 125 81      99      103    AST 14 - 40 32      31      33    ALT 10 - 40 '17      19      21       '$ This result is from an external source.     No results found for: CEA1 / No results  found for: CEA1 Lab Results  Component Value Date   PSA1 3.7 06/08/2021   No results found for: KGU542 No results found for: CAN125  No results found for: TOTALPROTELP, ALBUMINELP, A1GS, A2GS, BETS, BETA2SER, GAMS, MSPIKE, SPEI Lab Results  Component Value Date   TIBC 444 03/21/2020   IRONPCTSAT 4 (L) 03/21/2020   No results found for: LDH     Component Value Date/Time   PSA1 3.7 06/08/2021 1415    Review Flowsheet        Latest Ref Rng & Units 02/17/2021 04/12/2021 06/08/2021  Oncology Labs  Prostate Specific Ag, Serum 0.0 - 4.0 ng/mL 8.8   2.5   3.7             STUDIES:  No results found.    ASSESSMENT & PLAN:   Assessment/Plan:  A 76 y.o. 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  His his PSA is pending from today.  As long as this remains stable or improved, he will continue taking his Lupron/enzalutamide regimen until there becomes either evidence of disease progression or poor tolerance of treatment.  Clinically, the patient is doing well.  I will see him back in 3 months for repeat clinical assessment. The patient understands all the plans discussed today and is in agreement with them.      Marvia Pickles, PA-C

## 2022-03-10 NOTE — Telephone Encounter (Signed)
Left message for patient to call back and schedule Medicare Annual Wellness Visit (AWV) either virtually or in office. Left  my Connor [name] number [phone]   Last AWV 03/12/21  ; please schedule at anytime with [facility] Nurse Health Advisor 1 or 2

## 2022-03-11 ENCOUNTER — Inpatient Hospital Stay: Payer: PPO

## 2022-03-11 ENCOUNTER — Inpatient Hospital Stay: Payer: PPO | Attending: Oncology | Admitting: Hematology and Oncology

## 2022-03-11 ENCOUNTER — Other Ambulatory Visit: Payer: Self-pay | Admitting: Hematology and Oncology

## 2022-03-11 VITALS — BP 193/91 | HR 72 | Temp 97.6°F | Resp 16 | Ht 71.0 in | Wt 195.7 lb

## 2022-03-11 DIAGNOSIS — Z5111 Encounter for antineoplastic chemotherapy: Secondary | ICD-10-CM | POA: Diagnosis present

## 2022-03-11 DIAGNOSIS — C7951 Secondary malignant neoplasm of bone: Secondary | ICD-10-CM | POA: Diagnosis not present

## 2022-03-11 DIAGNOSIS — C61 Malignant neoplasm of prostate: Secondary | ICD-10-CM

## 2022-03-11 LAB — BASIC METABOLIC PANEL
BUN: 21 (ref 4–21)
CO2: 29 — AB (ref 13–22)
Chloride: 94 — AB (ref 99–108)
Creatinine: 1 (ref 0.6–1.3)
Glucose: 96
Potassium: 4.2 mEq/L (ref 3.5–5.1)
Sodium: 134 — AB (ref 137–147)

## 2022-03-11 LAB — CBC AND DIFFERENTIAL
HCT: 39 — AB (ref 41–53)
Hemoglobin: 13.1 — AB (ref 13.5–17.5)
Neutrophils Absolute: 2.75
Platelets: 225 10*3/uL (ref 150–400)
WBC: 5.1

## 2022-03-11 LAB — HEPATIC FUNCTION PANEL
ALT: 15 U/L (ref 10–40)
AST: 28 (ref 14–40)
Alkaline Phosphatase: 81 (ref 25–125)
Bilirubin, Total: 0.6

## 2022-03-11 LAB — COMPREHENSIVE METABOLIC PANEL
Albumin: 4.5 (ref 3.5–5.0)
Calcium: 9.5 (ref 8.7–10.7)

## 2022-03-11 LAB — CBC
MCV: 92 (ref 80–94)
RBC: 4.3 (ref 3.87–5.11)

## 2022-03-12 ENCOUNTER — Encounter: Payer: Self-pay | Admitting: Oncology

## 2022-03-12 ENCOUNTER — Encounter: Payer: Self-pay | Admitting: Hematology and Oncology

## 2022-03-12 LAB — PROSTATE-SPECIFIC AG, SERUM (LABCORP): Prostate Specific Ag, Serum: 9.5 ng/mL — ABNORMAL HIGH (ref 0.0–4.0)

## 2022-03-14 ENCOUNTER — Inpatient Hospital Stay: Payer: PPO

## 2022-03-14 VITALS — BP 157/73 | HR 69 | Temp 98.0°F | Resp 18 | Ht 71.0 in | Wt 197.5 lb

## 2022-03-14 DIAGNOSIS — C61 Malignant neoplasm of prostate: Secondary | ICD-10-CM

## 2022-03-14 DIAGNOSIS — Z5111 Encounter for antineoplastic chemotherapy: Secondary | ICD-10-CM | POA: Diagnosis not present

## 2022-03-14 MED ORDER — LEUPROLIDE ACETATE (3 MONTH) 22.5 MG IM KIT
22.5000 mg | PACK | Freq: Once | INTRAMUSCULAR | Status: AC
  Administered 2022-03-14: 22.5 mg via INTRAMUSCULAR
  Filled 2022-03-14: qty 22.5

## 2022-03-14 NOTE — Patient Instructions (Signed)
Leuprolide depot injection What is this medication? LEUPROLIDE (loo PROE lide) is a man-made protein that acts like a natural hormone in the body. It decreases testosterone in men and decreases estrogen in women. In men, this medicine is used to treat advanced prostate cancer. In women, some forms of this medicine may be used to treat endometriosis, uterine fibroids, or other male hormone-related problems. This medicine may be used for other purposes; ask your health care provider or pharmacist if you have questions. COMMON BRAND NAME(S): Eligard, Fensolv, Lupron Depot, Lupron Depot-Ped, Viadur What should I tell my care team before I take this medication? They need to know if you have any of these conditions: diabetes heart disease or previous heart attack high blood pressure high cholesterol mental illness osteoporosis pain or difficulty passing urine seizures spinal cord metastasis stroke suicidal thoughts, plans, or attempt; a previous suicide attempt by you or a family member tobacco smoker unusual vaginal bleeding (women) an unusual or allergic reaction to leuprolide, benzyl alcohol, other medicines, foods, dyes, or preservatives pregnant or trying to get pregnant breast-feeding How should I use this medication? This medicine is for injection into a muscle or for injection under the skin. It is given by a health care professional in a hospital or clinic setting. The specific product will determine how it will be given to you. Make sure you understand which product you receive and how often you will receive it. Talk to your pediatrician regarding the use of this medicine in children. Special care may be needed. Overdosage: If you think you have taken too much of this medicine contact a poison control center or emergency room at once. NOTE: This medicine is only for you. Do not share this medicine with others. What if I miss a dose? It is important not to miss a dose. Call your  doctor or health care professional if you are unable to keep an appointment. Depot injections: Depot injections are given either once-monthly, every 12 weeks, every 16 weeks, or every 24 weeks depending on the product you are prescribed. The product you are prescribed will be based on if you are male or male, and your condition. Make sure you understand your product and dosing. What may interact with this medication? Do not take this medicine with any of the following medications: chasteberry cisapride dronedarone pimozide thioridazine This medicine may also interact with the following medications: herbal or dietary supplements, like black cohosh or DHEA male hormones, like estrogens or progestins and birth control pills, patches, rings, or injections male hormones, like testosterone other medicines that prolong the QT interval (abnormal heart rhythm) This list may not describe all possible interactions. Give your health care provider a list of all the medicines, herbs, non-prescription drugs, or dietary supplements you use. Also tell them if you smoke, drink alcohol, or use illegal drugs. Some items may interact with your medicine. What should I watch for while using this medication? Visit your doctor or health care professional for regular checks on your progress. During the first weeks of treatment, your symptoms may get worse, but then will improve as you continue your treatment. You may get hot flashes, increased bone pain, increased difficulty passing urine, or an aggravation of nerve symptoms. Discuss these effects with your doctor or health care professional, some of them may improve with continued use of this medicine. Male patients may experience a menstrual cycle or spotting during the first months of therapy with this medicine. If this continues, contact your doctor or  health care professional. This medicine may increase blood sugar. Ask your healthcare provider if changes in diet  or medicines are needed if you have diabetes. What side effects may I notice from receiving this medication? Side effects that you should report to your doctor or health care professional as soon as possible: allergic reactions like skin rash, itching or hives, swelling of the face, lips, or tongue breathing problems chest pain depression or memory disorders pain in your legs or groin pain at site where injected or implanted seizures severe headache signs and symptoms of high blood sugar such as being more thirsty or hungry or having to urinate more than normal. You may also feel very tired or have blurry vision swelling of the feet and legs suicidal thoughts or other mood changes visual changes vomiting Side effects that usually do not require medical attention (report to your doctor or health care professional if they continue or are bothersome): breast swelling or tenderness decrease in sex drive or performance diarrhea hot flashes loss of appetite muscle, joint, or bone pains nausea redness or irritation at site where injected or implanted skin problems or acne This list may not describe all possible side effects. Call your doctor for medical advice about side effects. You may report side effects to FDA at 1-800-FDA-1088. Where should I keep my medication? This drug is given in a hospital or clinic and will not be stored at home. NOTE: This sheet is a summary. It may not cover all possible information. If you have questions about this medicine, talk to your doctor, pharmacist, or health care provider.  2023 Elsevier/Gold Standard (2021-09-10 00:00:00)  

## 2022-04-10 ENCOUNTER — Other Ambulatory Visit: Payer: Self-pay | Admitting: Internal Medicine

## 2022-04-11 ENCOUNTER — Other Ambulatory Visit: Payer: Self-pay | Admitting: Oncology

## 2022-04-11 DIAGNOSIS — C61 Malignant neoplasm of prostate: Secondary | ICD-10-CM

## 2022-04-14 ENCOUNTER — Other Ambulatory Visit: Payer: Self-pay | Admitting: Hematology and Oncology

## 2022-04-14 ENCOUNTER — Telehealth: Payer: Self-pay

## 2022-04-14 ENCOUNTER — Other Ambulatory Visit (HOSPITAL_COMMUNITY): Payer: Self-pay

## 2022-04-14 ENCOUNTER — Ambulatory Visit (INDEPENDENT_AMBULATORY_CARE_PROVIDER_SITE_OTHER): Payer: PPO

## 2022-04-14 VITALS — Ht 71.0 in | Wt 195.0 lb

## 2022-04-14 DIAGNOSIS — C61 Malignant neoplasm of prostate: Secondary | ICD-10-CM

## 2022-04-14 DIAGNOSIS — Z0000 Encounter for general adult medical examination without abnormal findings: Secondary | ICD-10-CM

## 2022-04-14 MED ORDER — ENZALUTAMIDE 40 MG PO TABS: 160.0000 mg | ORAL_TABLET | Freq: Every day | ORAL | 5 refills | Status: DC

## 2022-04-14 MED ORDER — ENZALUTAMIDE 40 MG PO TABS
160.0000 mg | ORAL_TABLET | Freq: Every day | ORAL | 5 refills | Status: DC
  Filled 2022-04-14: qty 120, 30d supply, fill #0

## 2022-04-14 NOTE — Patient Instructions (Addendum)
Connor [name] , Thank you for taking time to come for your Medicare Wellness Visit. I appreciate your ongoing commitment to your health goals. Please review the following plan we discussed and let me know if I can assist you in the future.   These are the goals we discussed:  Goals       Patient stated (pt-stated)      Stay alive.        This is a list of the screening recommended for you and due dates:  Health Maintenance  Topic Date Due   COVID-19 Vaccine (4 - Booster for Moderna series) 04/30/2022*   Zoster (Shingles) Vaccine (1 of 2) 07/15/2022*   Colon Cancer Screening  04/15/2023*   Tetanus Vaccine  04/15/2023*   Hepatitis C Screening: USPSTF Recommendation to screen - Ages 18-79 yo.  04/15/2023*   Flu Shot  05/24/2022   Pneumonia Vaccine  Completed   HPV Vaccine  Aged Out  *Topic was postponed. The date shown is not the original due date.    Advanced directives: Yes  Conditions/risks identified: None  Next appointment: Follow up in one year for your annual wellness visit.    Preventive Care 90 Years and Older, Male Preventive care refers to lifestyle choices and visits with your health care provider that can promote health and wellness. What does preventive care include? A yearly physical exam. This is also called an annual well check. Dental exams once or twice a year. Routine eye exams. Ask your health care provider how often you should have your eyes checked. Personal lifestyle choices, including: Daily care of your teeth and gums. Regular physical activity. Eating a healthy diet. Avoiding tobacco and drug use. Limiting alcohol use. Practicing safe sex. Taking low doses of aspirin every day. Taking vitamin and mineral supplements as recommended by your health care provider. What happens during an annual well check? The services and screenings done by your health care provider during your annual well check will depend on your age, overall health, lifestyle  risk factors, and family history of disease. Counseling  Your health care provider may ask you questions about your: Alcohol use. Tobacco use. Drug use. Emotional well-being. Home and relationship well-being. Sexual activity. Eating habits. History of falls. Memory and ability to understand (cognition). Work and work Statistician. Screening  You may have the following tests or measurements: Height, weight, and BMI. Blood pressure. Lipid and cholesterol levels. These may be checked every 5 years, or more frequently if you are over 29 years old. Skin check. Lung cancer screening. You may have this screening every year starting at age 51 if you have a 30-pack-year history of smoking and currently smoke or have quit within the past 15 years. Fecal occult blood test (FOBT) of the stool. You may have this test every year starting at age 81. Flexible sigmoidoscopy or colonoscopy. You may have a sigmoidoscopy every 5 years or a colonoscopy every 10 years starting at age 82. Prostate cancer screening. Recommendations will vary depending on your family history and other risks. Hepatitis C blood test. Hepatitis B blood test. Sexually transmitted disease (STD) testing. Diabetes screening. This is done by checking your blood sugar (glucose) after you have not eaten for a while (fasting). You may have this done every 1-3 years. Abdominal aortic aneurysm (AAA) screening. You may need this if you are a current or former smoker. Osteoporosis. You may be screened starting at age 46 if you are at high risk. Talk with your health care  provider about your test results, treatment options, and if necessary, the need for more tests. Vaccines  Your health care provider may recommend certain vaccines, such as: Influenza vaccine. This is recommended every year. Tetanus, diphtheria, and acellular pertussis (Tdap, Td) vaccine. You may need a Td booster every 10 years. Zoster vaccine. You may need this after age  82. Pneumococcal 13-valent conjugate (PCV13) vaccine. One dose is recommended after age 79. Pneumococcal polysaccharide (PPSV23) vaccine. One dose is recommended after age 51. Talk to your health care provider about which screenings and vaccines you need and how often you need them. This information is not intended to replace advice given to you by your health care provider. Make sure you discuss any questions you have with your health care provider. Document Released: 11/06/2015 Document Revised: 06/29/2016 Document Reviewed: 08/11/2015 Elsevier Interactive Patient Education  2017 [place] Prevention in the Home Falls can cause injuries. They can happen to people of all ages. There are many things you can do to make your home safe and to help prevent falls. What can I do on the outside of my home? Regularly fix the edges of walkways and driveways and fix any cracks. Remove anything that might make you trip as you walk through a door, such as a raised step or threshold. Trim any bushes or trees on the path to your home. Use bright outdoor lighting. Clear any walking paths of anything that might make someone trip, such as rocks or tools. Regularly check to see if handrails are loose or broken. Make sure that both sides of any steps have handrails. Any raised decks and porches should have guardrails on the edges. Have any leaves, snow, or ice cleared regularly. Use sand or salt on walking paths during winter. Clean up any spills in your garage right away. This includes oil or grease spills. What can I do in the bathroom? Use night lights. Install grab bars by the toilet and in the tub and shower. Do not use towel bars as grab bars. Use non-skid mats or decals in the tub or shower. If you need to sit down in the shower, use a plastic, non-slip stool. Keep the floor dry. Clean up any water that spills on the floor as soon as it happens. Remove soap buildup in the tub or shower  regularly. Attach bath mats securely with double-sided non-slip rug tape. Do not have throw rugs and other things on the floor that can make you trip. What can I do in the bedroom? Use night lights. Make sure that you have a light by your bed that is easy to reach. Do not use any sheets or blankets that are too big for your bed. They should not hang down onto the floor. Have a firm chair that has side arms. You can use this for support while you get dressed. Do not have throw rugs and other things on the floor that can make you trip. What can I do in the kitchen? Clean up any spills right away. Avoid walking on wet floors. Keep items that you use a lot in easy-to-reach places. If you need to reach something above you, use a strong step stool that has a grab bar. Keep electrical cords out of the way. Do not use floor polish or wax that makes floors slippery. If you must use wax, use non-skid floor wax. Do not have throw rugs and other things on the floor that can make you trip. What can I  do with my stairs? Do not leave any items on the stairs. Make sure that there are handrails on both sides of the stairs and use them. Fix handrails that are broken or loose. Make sure that handrails are as long as the stairways. Check any carpeting to make sure that it is firmly attached to the stairs. Fix any carpet that is loose or worn. Avoid having throw rugs at the top or bottom of the stairs. If you do have throw rugs, attach them to the floor with carpet tape. Make sure that you have a light switch at the top of the stairs and the bottom of the stairs. If you do not have them, ask someone to add them for you. What else can I do to help prevent falls? Wear shoes that: Do not have high heels. Have rubber bottoms. Are comfortable and fit you well. Are closed at the toe. Do not wear sandals. If you use a stepladder: Make sure that it is fully opened. Do not climb a closed stepladder. Make sure that  both sides of the stepladder are locked into place. Ask someone to hold it for you, if possible. Clearly mark and make sure that you can see: Any grab bars or handrails. First and last steps. Where the edge of each step is. Use tools that help you move around (mobility aids) if they are needed. These include: Canes. Walkers. Scooters. Crutches. Turn on the lights when you go into a dark area. Replace any light bulbs as soon as they burn out. Set up your furniture so you have a clear path. Avoid moving your furniture around. If any of your floors are uneven, fix them. If there are any pets around you, be aware of where they are. Review your medicines with your doctor. Some medicines can make you feel dizzy. This can increase your chance of falling. Ask your doctor what other things that you can do to help prevent falls. This information is not intended to replace advice given to you by your health care provider. Make sure you discuss any questions you have with your health care provider. Document Released: 08/06/2009 Document Revised: 03/17/2016 Document Reviewed: 11/14/2014 Elsevier Interactive Patient Education  2017 [employer].

## 2022-04-14 NOTE — Progress Notes (Signed)
Subjective:   Connor [name] is a 76 y.o. male who presents for Medicare Annual/Subsequent preventive examination.  Review of Systems    Virtual Visit via Telephone Note  I connected with  Connor [name] on 04/14/22 at 11:30 AM EDT by telephone and verified that I am speaking with the correct person using two identifiers.  Location: Patient: Home Provider: Office Persons participating in the virtual visit: patient/Nurse Health Advisor   I discussed the limitations, risks, security and privacy concerns of performing an evaluation and management service by telephone and the availability of in person appointments. The patient expressed understanding and agreed to proceed.  Interactive audio and video telecommunications were attempted between this nurse and patient, however failed, due to patient having technical difficulties OR patient did not have access to video capability.  We continued and completed visit with audio only.  Some vital signs may be absent or patient reported.   [name], LPN  Cardiac Risk Factors include: advanced age (>75men, >45 women);hypertension;male gender     Objective:    Today's Vitals   04/14/22 1145  Weight: 195 lb (88.5 kg)  Height: 5\' 11"  (1.803 m)   Body mass index is 27.2 kg/m.     04/14/2022   11:59 AM 03/11/2022   11:03 AM 12/15/2021   11:34 AM 10/26/2021    1:57 PM 09/14/2021    2:00 PM 07/12/2021    3:45 PM 06/11/2021   11:41 AM  Advanced Directives  Does Patient Have a Medical Advance Directive? Yes Yes Yes Yes Yes Yes Yes  Type of Estate agent of [place];Living will        Does patient want to make changes to medical advance directive? No - Patient declined        Copy of Healthcare Power of Attorney in Chart? No - copy requested          Current Medications (verified) Outpatient Encounter Medications as of 04/14/2022  Medication Sig   amLODipine (NORVASC) 5 MG tablet TAKE 1 TABLET(5 MG) BY  MOUTH DAILY   amoxicillin (AMOXIL) 500 MG capsule SMARTSIG:4 Capsule(s) By Mouth   aspirin EC 81 MG tablet Take 81 mg by mouth daily. Swallow whole.   calcium-vitamin D (OSCAL WITH D) 500-200 MG-UNIT tablet Take 1 tablet by mouth 2 (two) times daily. 1500mg  / 200unit tablet   chlorhexidine (PERIDEX) 0.12 % solution RINSE AND GARGLE 15 ML BY MOUTH OR THROAT TWICE DAILY   Cyanocobalamin (B-12 PO) Take by mouth.   lamoTRIgine (LAMICTAL) 100 MG tablet TAKE 1 TABLET BY MOUTH DAILY AND 2 TABLETS AT BEDTIME   Leuprolide Acetate, 6 Month, (LUPRON DEPOT, 41-MONTH,) 45 MG injection Inject 45 mg into the muscle every 6 (six) months.   PREVIDENT 5000 BOOSTER PLUS 1.1 % PSTE Place onto teeth.   rosuvastatin (CRESTOR) 20 MG tablet 1 tablet   valsartan (DIOVAN) 160 MG tablet TAKE 1 TABLET(160 MG) BY MOUTH TWICE DAILY   [DISCONTINUED] enzalutamide (XTANDI) 40 MG tablet Take 4 tablets (160 mg total) by mouth daily.   No facility-administered encounter medications on file as of 04/14/2022.    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 Sandria Manly - HE RETIRED AND PT LAST SAW DR. PENUMALLI   Stroke (HCC) DEC 2013   UNABLE TO SPEAK OR MOVE AND RT SIDE WEAKNESS AND LOSS OF SKIN SENSITIVITY  TO HEAT AND COLD ON RT SIDE, DOUBLE VISION. BALANCE PROBLEMS---STATES STILL HAS DOUBLE VISION AND BALANCE PROBLEM AND RT SIDED LOSS OF SKIN SENSITIVITY   Past Surgical History:  Procedure Laterality Date   CORONARY ARTERY BYPASS GRAFT N/A 03/27/2020   Procedure: CORONARY ARTERY BYPASS GRAFTING (CABG) x Three, using left internal mammary artery and right leg greater saphenous vein harvested endoscopically;  Surgeon: Kerin Perna, MD;  Location: [facility] OR;  Service: Open Heart Surgery;  Laterality: N/A;   hydrocelectomy  11/2000   left   IR FLUORO GUIDE CV LINE RIGHT  08/31/2017   IR GASTROSTOMY TUBE MOD SED  04/07/2020   IR GASTROSTOMY TUBE REMOVAL  07/20/2020   IR US GUIDE VASC ACCESS RIGHT  08/31/2017   JOINT REPLACEMENT     LEFT TOTAL KNEE ARTHROPLASTY   MOUTH SURGERY  2019   NEPHRECTOMY  1990's   left   NEPHRECTOMY     PROSTATE BIOPSY  05/20/14   Gleason 4+3=7, vol 25 gm   RIGHT/LEFT HEART CATH AND CORONARY ANGIOGRAPHY N/A 03/24/2020   Procedure: RIGHT/LEFT HEART CATH AND CORONARY ANGIOGRAPHY;  Surgeon: Kathleene Hazel, MD;  Location: [facility] INVASIVE CV LAB;  Service: Cardiovascular;  Laterality: N/A;   TEE WITHOUT CARDIOVERSION  10/04/2011   Procedure: TRANSESOPHAGEAL ECHOCARDIOGRAM (TEE);  Surgeon: Donato Schultz, MD;  Location: [facility] ENDOSCOPY;  Service: Cardiovascular;  Laterality: N/A;   TEE WITHOUT CARDIOVERSION N/A 03/27/2020   Procedure: TRANSESOPHAGEAL ECHOCARDIOGRAM (TEE);  Surgeon: Donata Clay, Theron Arista, MD;  Location: [facility] OR;  Service: Open Heart Surgery;  Laterality: N/A;   TOTAL KNEE ARTHROPLASTY  2011   left   TOTAL KNEE ARTHROPLASTY Right 02/24/2014   Procedure: RIGHT TOTAL KNEE ARTHROPLASTY;  Surgeon: Loanne Drilling, MD;  Location: [facility] ORS;  Service: Orthopedics;  Laterality: Right;   Family History  Problem Relation Age of Onset   Diabetes Mother    Heart disease Mother    Heart disease Father    Social History   Socioeconomic History   Marital status: Divorced    Spouse name:  Not on file   Number of children: 2   Years of education: 12th   Highest education level: Not on file  Occupational History   Occupation: Heritage manager: OTHER    Comment: [employer] and [employer]  Tobacco Use   Smoking status: Never   Smokeless tobacco: Never  Vaping Use   Vaping Use: Never used  Substance and Sexual Activity   Alcohol use: Yes    Alcohol/week: 3.0 standard drinks of alcohol    Types: 3 Cans of beer per week    Comment: 3 beers a day  Drug use: No   Sexual activity: Not Currently  Other Topics Concern   Not on file  Social History Narrative   Patient is divorced with 2 children.   Patient is right handed.   Patient has hs education.   Patient drinks 2 cups daily.   Social Determinants of Health   Financial Resource Strain: Low Risk  (04/14/2022)   Overall Financial Resource Strain (CARDIA)    Difficulty of Paying Living Expenses: Not hard at all  Food Insecurity: No Food Insecurity (04/14/2022)   Hunger Vital Sign    Worried About Running Out of Food in the Last Year: Never true    Ran Out of Food in the Last Year: Never true  Transportation Needs: No Transportation Needs (04/14/2022)   PRAPARE - Administrator, Civil Service (Medical): No    Lack of Transportation (Non-Medical): No  Physical Activity: Inactive (04/14/2022)   Exercise Vital Sign    Days of Exercise per Week: 0 days    Minutes of Exercise per Session: 0 min  Stress: No Stress Concern Present (04/14/2022)   [employer] of Occupational Health - Occupational Stress Questionnaire    Feeling of Stress : Not at all  Social Connections: Socially Isolated (04/14/2022)   Social Connection and Isolation Panel [NHANES]    Frequency of Communication with Friends and Family: More than three times a week    Frequency of Social Gatherings with Friends and Family: More than three times a week    Attends Religious Services: Never    Database administrator or Organizations: No     Attends Banker Meetings: Never    Marital Status: Divorced    Tobacco Counseling Counseling given: Not Answered   Clinical Intake: How often do you need to have someone help you when you read instructions, pamphlets, or other written materials from your doctor or pharmacy?: 1 - Never  Diabetic?  No   Activities of Daily Living    04/14/2022   11:56 AM  In your present state of health, do you have any difficulty performing the following activities:  Hearing? 1  Comment Wears hearing aids  Vision? 0  Difficulty concentrating or making decisions? 0  Walking or climbing stairs? 0  Dressing or bathing? 0  Doing errands, shopping? 0  Preparing Food and eating ? N  Using the Toilet? N  In the past six months, have you accidently leaked urine? N  Do you have problems with loss of bowel control? N  Managing your Medications? N  Managing your Finances? N  Housekeeping or managing your Housekeeping? N    Patient Care Team: Shirline Frees, NP as PCP - General (Family Medicine) Rennis Golden Lisette Abu, MD as PCP - Cardiology (Cardiology) Weston Settle, MD as Consulting Physician (Oncology) Lincoln Brigham, DDS as Consulting Physician (Oral Surgery) Judyann Munson, MD as Consulting Physician (Infectious Diseases) Vertell Limber, DDS (Dentistry) Marjory Lies Glenford Bayley, MD as Consulting Physician (Neurology) Bernette Redbird, MD as Consulting Physician (Gastroenterology)  Indicate any recent Medical Services you may have received from other than [facility] providers in the past year (date may be approximate).     Assessment:   This is a routine wellness examination for [place].  Hearing/Vision screen Hearing Screening - Comments:: Wears hearing aids Vision Screening - Comments:: Wears glasses. Follow[facility] by [facility]  Dietary issues and exercise activities discussed: Exercise limited by: None identified   Goals Addressed  This Visit's Progress      Patient stated (pt-stated)        Stay alive.       Depression Screen    04/14/2022   11:54 AM 03/12/2021    1:31 PM 03/12/2021    1:27 PM 09/29/2020   12:58 PM 04/13/2020    9:40 AM 08/26/2019   11:55 AM 07/22/2019    2:51 PM  PHQ 2/9 Scores  PHQ - 2 Score 0 0 0 0 0 0 0    Fall Risk    04/14/2022   11:57 AM 03/12/2021    1:31 PM 09/29/2020   12:58 PM 04/13/2020    9:40 AM 08/26/2019   11:55 AM  Fall Risk   Falls in the past year? 0 0 0 0 0  Number falls in past yr: 0 0 0 0   Injury with Fall? 0 0  0   Risk for fall due to : No Fall Risks      Follow up  Falls evaluation completed   Falls evaluation completed    FALL RISK PREVENTION PERTAINING TO THE HOME:  Any stairs in or around the home? No  If so, are there any without handrails? No  Home free of loose throw rugs in walkways, pet beds, electrical cords, etc? Yes  Adequate lighting in your home to reduce risk of falls? Yes   ASSISTIVE DEVICES UTILIZED TO PREVENT FALLS:  Life alert? No  Use of a cane, walker or w/c? No  Grab bars in the bathroom? Yes  Shower chair or bench in shower? Yes  Elevated toilet seat or a handicapped toilet? Yes   TIMED UP AND GO:  Was the test performed? No . Audio Visit  Cognitive Function:   Immunizations Immunization History  Administered Date(s) Administered   Fluad Quad(high Dose 65+) 08/26/2019   Influenza Split 07/28/2018   Influenza,inj,Quad PF,6+ Mos 08/29/2017   Influenza-Unspecified 06/22/2020   Moderna Sars-Covid-2 Vaccination 11/28/2019, 12/26/2019, 09/25/2020   Pneumococcal Conjugate-13 11/23/2015   Pneumococcal Polysaccharide-23 03/06/2017   Tdap 05/16/2005    TDAP status: Due, Education has been provided regarding the importance of this vaccine. Advised may receive this vaccine at local pharmacy or Health Dept. Aware to provide a copy of the vaccination record if obtained from local pharmacy or Health Dept. Verbalized acceptance and understanding.  Flu Vaccine  status: Declined, Education has been provided regarding the importance of this vaccine but patient still declined. Advised may receive this vaccine at local pharmacy or Health Dept. Aware to provide a copy of the vaccination record if obtained from local pharmacy or Health Dept. Verbalized acceptance and understanding.  Pneumococcal vaccine status: Up to date  Covid-19 vaccine status: Completed vaccines  Qualifies for Shingles Vaccine? Yes   Zostavax completed No   Shingrix Completed?: No.    Education has been provided regarding the importance of this vaccine. Patient has been advised to call insurance company to determine out of pocket expense if they have not yet received this vaccine. Advised may also receive vaccine at local pharmacy or Health Dept. Verbalized acceptance and understanding.  Screening Tests Health Maintenance  Topic Date Due   COVID-19 Vaccine (4 - Booster for Moderna series) 04/30/2022 (Originally 11/20/2020)   Zoster Vaccines- Shingrix (1 of 2) 07/15/2022 (Originally 02/10/1965)   COLONOSCOPY (Pts 45-31yrs Insurance coverage will need to be confirmed)  04/15/2023 (Originally 02/09/1993)   TETANUS/TDAP  04/15/2023 (Originally 05/17/2015)   Hepatitis C Screening  04/15/2023 (Originally 02/09/1964)   INFLUENZA  VACCINE  05/24/2022   Pneumonia Vaccine 59+ Years old  Completed   HPV VACCINES  Aged Out    Health Maintenance  There are no preventive care reminders to display for this patient.   Colorectal cancer screening: Referral to GI placed Patient deferred. Pt aware the office will call re: appt.  Lung Cancer Screening: (Low Dose CT Chest recommended if Age 28-80 years, 30 pack-year currently smoking OR have quit w/in 15years.) does not qualify.     Additional Screening:  Hepatitis C Screening: does qualify; Completed Patient deferred  Vision Screening: Recommended annual ophthalmology exams for early detection of glaucoma and other disorders of the eye. Is the  patient up to date with their annual eye exam?  Yes  Who is the provider or what is the name of the office in which the patient attends annual eye exams? [facility] If pt is not established with a provider, would they like to be referred to a provider to establish care? No .   Dental Screening: Recommended annual dental exams for proper oral hygiene  Community Resource Referral / Chronic Care Management:  CRR required this visit?  No   CCM required this visit?  No      Plan:     I have personally reviewed and noted the following in the patient's chart:   Medical and social history Use of alcohol, tobacco or illicit drugs  Current medications and supplements including opioid prescriptions. Patient is not currently taking opioid prescriptions. Functional ability and status Nutritional status Physical activity Advanced directives List of other physicians Hospitalizations, surgeries, and ER visits in previous 12 months Vitals Screenings to include cognitive, depression, and falls Referrals and appointments  In addition, I have reviewed and discussed with patient certain preventive protocols, quality metrics, and best practice recommendations. A written personalized care plan for preventive services as well as general preventive health recommendations were provided to patient.     [name], LPN   [id number]/41/[id number]   Nurse Notes: Patient request f/u with concerns of numbness in right toes.

## 2022-04-22 ENCOUNTER — Encounter: Payer: Self-pay | Admitting: Adult Health

## 2022-04-22 ENCOUNTER — Ambulatory Visit (INDEPENDENT_AMBULATORY_CARE_PROVIDER_SITE_OTHER): Payer: PPO | Admitting: Adult Health

## 2022-04-22 VITALS — BP 140/86 | HR 80 | Temp 98.7°F | Ht 71.0 in | Wt 196.0 lb

## 2022-04-22 DIAGNOSIS — C4490 Unspecified malignant neoplasm of skin, unspecified: Secondary | ICD-10-CM

## 2022-04-22 DIAGNOSIS — C61 Malignant neoplasm of prostate: Secondary | ICD-10-CM | POA: Diagnosis not present

## 2022-04-22 DIAGNOSIS — E782 Mixed hyperlipidemia: Secondary | ICD-10-CM

## 2022-04-22 DIAGNOSIS — I10 Essential (primary) hypertension: Secondary | ICD-10-CM | POA: Diagnosis not present

## 2022-04-22 DIAGNOSIS — Z0000 Encounter for general adult medical examination without abnormal findings: Secondary | ICD-10-CM

## 2022-04-22 DIAGNOSIS — I25810 Atherosclerosis of coronary artery bypass graft(s) without angina pectoris: Secondary | ICD-10-CM

## 2022-04-22 DIAGNOSIS — G40909 Epilepsy, unspecified, not intractable, without status epilepticus: Secondary | ICD-10-CM

## 2022-04-22 MED ORDER — CHLORHEXIDINE GLUCONATE 0.12 % MT SOLN
OROMUCOSAL | 11 refills | Status: DC
Start: 2022-04-22 — End: 2023-08-29

## 2022-04-22 NOTE — Progress Notes (Signed)
Subjective:    Patient ID: Connor [name], male    DOB: 1945-10-27, 76 y.o.   MRN: [id number]  HPI  Patient presents for yearly preventative medicine examination. 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  History of Prostate Cancer-metastatic including spread of disease to his bones.  Managed by oncology.  He is on Lupron/enzalutamide complete androgen blockage and was recently started on Xtandi.  Does report having fatigue but denies bone pain or other systemic symptoms.  Coronary artery disease-history of CABG in June 2021 which showed multivessel coronary artery disease had CABG x3 with LIMA to LAD, SVG to OM, and SVG to PDA.  Managed by cardiology.  Currently prescribed*20 mg daily Lab Results  Component Value Date   CHOL 153 09/29/2020   HDL 71 09/29/2020   LDLCALC 67 09/29/2020   TRIG 74 09/29/2020   CHOLHDL 2.2 09/29/2020   HTN -managed by cardiology.  Prescribed valsartan 160 mg twice daily Norvasc 5 mg daily.  He reports most of his blood pressures are in the 140s to 150s over 80s.  He has seen blood pressures down into the 784O systolic and as high as 962 systolic. BP Readings from Last 3 Encounters:  04/22/22 140/86  03/14/22 (!) 157/73  03/11/22 (!) 193/91   Seizure disorder-managed by neurology.  He has been seizure-free for many years.  Controlled with Lamictal 100 mg morning and 200 mg in the evening  Scalp Wound -he has a "wound" on the crown of his head that has been present for quite some time.   All immunizations and health maintenance protocols were reviewed with the patient and needed  orders were placed.  Appropriate screening laboratory values were ordered for the patient including screening of hyperlipidemia, renal function and hepatic function. If indicated by BPH, a PSA was ordered.  Medication reconciliation,  past medical history, social history, problem list and allergies were reviewed in detail with the patient  Goals were established with regard to weight loss, exercise, and  diet in compliance with medications  Review of Systems  Constitutional:  Positive for fatigue.  HENT: Negative.    Eyes: Negative.   Respiratory: Negative.    Cardiovascular: Negative.   Gastrointestinal: Negative.   Endocrine: Negative.   Genitourinary: Negative.   Musculoskeletal: Negative.   Skin:  Positive for wound.  Allergic/Immunologic: Negative.   Neurological: Negative.   All other systems reviewed and are negative.  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 - HE RETIRED AND PT LAST SAW DR. PENUMALLI   Stroke ([place]) DEC 2013   UNABLE TO SPEAK OR MOVE AND RT SIDE WEAKNESS AND LOSS OF SKIN SENSITIVITY TO HEAT AND COLD ON RT SIDE, DOUBLE VISION. BALANCE PROBLEMS---STATES STILL HAS DOUBLE VISION AND BALANCE PROBLEM AND RT SIDED LOSS OF SKIN SENSITIVITY    Social History   Socioeconomic History    Marital status: Divorced    Spouse name: Not on file   Number of children: 2   Years of education: 12th   Highest education level: Not on file  Occupational History   Occupation: Information systems manager: OTHER    Comment: [employer]  Tobacco Use   Smoking status: Never   Smokeless tobacco: Never  Vaping Use   Vaping Use: Never used  Substance and Sexual Activity   Alcohol use: Yes    Alcohol/week: 3.0 standard drinks of alcohol    Types: 3 Cans of beer per week    Comment: 3 beers a day    Drug use: No   Sexual activity: Not Currently  Other Topics Concern   Not on file  Social History Narrative   Patient is divorced with 2 children.   Patient is right handed.   Patient has hs education.   Patient drinks 2 cups daily.   Social Determinants of Health   Financial Resource Strain: Low Risk  (04/14/2022)   Overall Financial Resource Strain (CARDIA)    Difficulty of Paying Living Expenses: Not hard at all  Food Insecurity: No Food Insecurity (04/14/2022)   Hunger Vital Sign    Worried About Running Out of Food in the Last Year: Never true    Ran Out of Food in the Last Year: Never true  Transportation Needs: No Transportation Needs (04/14/2022)   PRAPARE - Hydrologist (Medical): No    Lack of Transportation (Non-Medical): No  Physical Activity: Inactive (04/14/2022)   Exercise Vital Sign    Days of Exercise per Week: 0 days    Minutes of Exercise per Session: 0 min  Stress: No Stress Concern Present (04/14/2022)   [place]    Feeling of Stress : Not at all  Social Connections: Socially Isolated (04/14/2022)   Social Connection and Isolation Panel [NHANES]    Frequency of Communication with Friends and Family: More than three times a week    Frequency of Social Gatherings with Friends and Family: More than three times a week    Attends Religious Services: Never    Building surveyor or Organizations: No    Attends Archivist Meetings: Never    Marital Status: Divorced  Human resources officer Violence: Not At Risk (04/14/2022)   Humiliation, Afraid, Rape, and Kick questionnaire    Fear of Current or Ex-Partner: No    Emotionally Abused: No    Physically Abused: No    Sexually Abused: No    Past Surgical History:  Procedure Laterality Date   CORONARY ARTERY BYPASS GRAFT N/A 03/27/2020   Procedure: CORONARY ARTERY BYPASS GRAFTING (CABG) x Three, using left internal mammary artery and right leg greater saphenous vein harvested endoscopically;  Surgeon: Ivin Poot, MD;  Location: [facility];  Service: Open Heart  Surgery;  Laterality: N/A;   hydrocelectomy  11/2000   left   IR FLUORO GUIDE CV LINE RIGHT  08/31/2017   IR GASTROSTOMY TUBE MOD SED  04/07/2020   IR GASTROSTOMY TUBE REMOVAL  07/20/2020   IR US GUIDE VASC ACCESS RIGHT  08/31/2017   JOINT REPLACEMENT     LEFT TOTAL KNEE ARTHROPLASTY   MOUTH SURGERY  2019   NEPHRECTOMY  1990's   left   NEPHRECTOMY     PROSTATE BIOPSY  05/20/14   Gleason 4+3=7, vol 25 gm   RIGHT/LEFT HEART CATH AND CORONARY ANGIOGRAPHY N/A 03/24/2020   Procedure: RIGHT/LEFT HEART CATH AND CORONARY ANGIOGRAPHY;  Surgeon: Burnell Blanks, MD;  Location: [facility];  Service: Cardiovascular;  Laterality: N/A;   TEE WITHOUT CARDIOVERSION  10/04/2011   Procedure: TRANSESOPHAGEAL ECHOCARDIOGRAM (TEE);  Surgeon: Candee Furbish, MD;  Location: [facility];  Service: Cardiovascular;  Laterality: N/A;   TEE WITHOUT CARDIOVERSION N/A 03/27/2020   Procedure: TRANSESOPHAGEAL ECHOCARDIOGRAM (TEE);  Surgeon: Prescott Gum, Collier Salina, MD;  Location: [facility];  Service: Open Heart Surgery;  Laterality: N/A;   TOTAL KNEE ARTHROPLASTY  2011   left   TOTAL KNEE ARTHROPLASTY Right 02/24/2014   Procedure: RIGHT TOTAL KNEE ARTHROPLASTY;  Surgeon: Gearlean Alf, MD;  Location: [facility];  Service: Orthopedics;  Laterality: Right;    Family History   Problem Relation Age of Onset   Diabetes Mother    Heart disease Mother    Heart disease Father     Allergies  Allergen Reactions   Morphine And Related Nausea And Vomiting    Current Outpatient Medications on File Prior to Visit  Medication Sig Dispense Refill   amLODipine (NORVASC) 5 MG tablet TAKE 1 TABLET(5 MG) BY MOUTH DAILY 30 tablet 3   aspirin EC 81 MG tablet Take 81 mg by mouth daily. Swallow whole.     Cyanocobalamin (B-12 PO) Take by mouth.     enzalutamide (XTANDI) 40 MG tablet Take 4 tablets (160 mg total) by mouth daily. 120 tablet 5   lamoTRIgine (LAMICTAL) 100 MG tablet TAKE 1 TABLET BY MOUTH DAILY AND 2 TABLETS AT BEDTIME 270 tablet 1   Leuprolide Acetate, 6 Month, (LUPRON DEPOT, 62-MONTH,) 45 MG injection Inject 45 mg into the muscle every 6 (six) months.     PREVIDENT 5000 BOOSTER PLUS 1.1 % PSTE Place onto teeth.     rosuvastatin (CRESTOR) 20 MG tablet 1 tablet     valsartan (DIOVAN) 160 MG tablet TAKE 1 TABLET(160 MG) BY MOUTH TWICE DAILY 90 tablet 0   calcium-vitamin D (OSCAL WITH D) 500-200 MG-UNIT tablet Take 1 tablet by mouth 2 (two) times daily. '1500mg'$  / 200unit tablet (Patient not taking: Reported on 04/22/2022)     No current facility-administered medications on file prior to visit.    BP 140/86   Pulse 80   Temp 98.7 F (37.1 C) (Oral)   Ht '5\' 11"'$  (1.803 m)   Wt 196 lb (88.9 kg)   SpO2 99%   BMI 27.34 kg/m        Objective:   Physical Exam Vitals and nursing note reviewed.  Constitutional:      Appearance: Normal appearance.  HENT:     Head: Normocephalic and atraumatic.     Right Ear: Tympanic membrane, ear canal and external ear normal. There is no impacted cerumen.     Left Ear: Tympanic membrane, ear canal and external ear normal. There is no impacted cerumen.  Nose: Nose normal. No congestion or rhinorrhea.     Mouth/Throat:     Mouth: Mucous membranes are dry.     Pharynx: Oropharynx is clear.  Eyes:     Extraocular  Movements: Extraocular movements intact.     Conjunctiva/sclera: Conjunctivae normal.     Pupils: Pupils are equal, round, and reactive to light.  Cardiovascular:     Rate and Rhythm: Normal rate and regular rhythm.     Pulses: Normal pulses.     Heart sounds: Normal heart sounds.  Pulmonary:     Breath sounds: Normal breath sounds.  Abdominal:     General: Abdomen is flat. Bowel sounds are normal.     Palpations: Abdomen is soft.  Musculoskeletal:        General: Normal range of motion.  Skin:    General: Skin is warm and dry.     Comments: He has about a 1cm nonhealing wound with ulcerated center and localized erythema noted on the crown of his head.  Neurological:     General: No focal deficit present.     Mental Status: He is alert and oriented to person, place, and time.  Psychiatric:        Mood and Affect: Mood normal.        Behavior: Behavior normal.        Thought Content: Thought content normal.        Judgment: Judgment normal.       Assessment & Plan:  1. Routine general medical examination at a health care facility - Follow up in one year or sooner if needed - CBC with Differential/Platelet; Future - Comprehensive metabolic panel; Future - Hemoglobin A1c; Future - Lipid panel; Future - TSH; Future - TSH - Lipid panel - Hemoglobin A1c - Comprehensive metabolic panel - CBC with Differential/Platelet  2. Malignant neoplasm of prostate [facility]) - Per oncology   3. Essential hypertension - Libile at times but average seems to be WNL  - Follow up with cardiology as directed - CBC with Differential/Platelet; Future - Comprehensive metabolic panel; Future - Hemoglobin A1c; Future - Lipid panel; Future - TSH; Future - TSH - Lipid panel - Hemoglobin A1c - Comprehensive metabolic panel - CBC with Differential/Platelet  4. Mixed hyperlipidemia - Continue with statin  - CBC with Differential/Platelet; Future - Comprehensive metabolic panel; Future -  Hemoglobin A1c; Future - Lipid panel; Future - TSH; Future - TSH - Lipid panel - Hemoglobin A1c - Comprehensive metabolic panel - CBC with Differential/Platelet  5. Skin cancer -Wound on the crown of his head is concerning for squamous cell carcinoma.  We discussed referral to dermatology.  He has a dermatologist in [place] that he will follow-up with ASAP  6. Seizure disorder ([place]) - per neurology   7. Coronary artery disease involving coronary bypass graft of native heart without angina pectoris - Continue with statin  - Follow up with Cardiology  - CBC with Differential/Platelet; Future - Comprehensive metabolic panel; Future - Hemoglobin A1c; Future - Lipid panel; Future - TSH; Future - TSH - Lipid panel - Hemoglobin A1c - Comprehensive metabolic panel - CBC with Differential/Platelet  Dorothyann Peng, NP

## 2022-04-22 NOTE — Patient Instructions (Addendum)
It was great seeing you today   We will follow up with you regarding your labs   Follow up with dermatology about the spot on your head

## 2022-04-23 LAB — CBC WITH DIFFERENTIAL/PLATELET
Absolute Monocytes: 1051 cells/uL — ABNORMAL HIGH (ref 200–950)
Basophils Absolute: 37 cells/uL (ref 0–200)
Basophils Relative: 0.5 %
Eosinophils Absolute: 270 cells/uL (ref 15–500)
Eosinophils Relative: 3.7 %
HCT: 35.6 % — ABNORMAL LOW (ref 38.5–50.0)
Hemoglobin: 12.3 g/dL — ABNORMAL LOW (ref 13.2–17.1)
Lymphs Abs: 1248 cells/uL (ref 850–3900)
MCH: 31.5 pg (ref 27.0–33.0)
MCHC: 34.6 g/dL (ref 32.0–36.0)
MCV: 91.3 fL (ref 80.0–100.0)
MPV: 10 fL (ref 7.5–12.5)
Monocytes Relative: 14.4 %
Neutro Abs: 4694 cells/uL (ref 1500–7800)
Neutrophils Relative %: 64.3 %
Platelets: 256 10*3/uL (ref 140–400)
RBC: 3.9 10*6/uL — ABNORMAL LOW (ref 4.20–5.80)
RDW: 12.1 % (ref 11.0–15.0)
Total Lymphocyte: 17.1 %
WBC: 7.3 10*3/uL (ref 3.8–10.8)

## 2022-04-23 LAB — COMPREHENSIVE METABOLIC PANEL
AG Ratio: 1.4 (calc) (ref 1.0–2.5)
ALT: 7 U/L — ABNORMAL LOW (ref 9–46)
AST: 17 U/L (ref 10–35)
Albumin: 4.2 g/dL (ref 3.6–5.1)
Alkaline phosphatase (APISO): 83 U/L (ref 35–144)
BUN/Creatinine Ratio: 22 (calc) (ref 6–22)
BUN: 26 mg/dL — ABNORMAL HIGH (ref 7–25)
CO2: 27 mmol/L (ref 20–32)
Calcium: 9.8 mg/dL (ref 8.6–10.3)
Chloride: 93 mmol/L — ABNORMAL LOW (ref 98–110)
Creat: 1.17 mg/dL (ref 0.70–1.28)
Globulin: 3 g/dL (calc) (ref 1.9–3.7)
Glucose, Bld: 97 mg/dL (ref 65–99)
Potassium: 4.7 mmol/L (ref 3.5–5.3)
Sodium: 130 mmol/L — ABNORMAL LOW (ref 135–146)
Total Bilirubin: 0.5 mg/dL (ref 0.2–1.2)
Total Protein: 7.2 g/dL (ref 6.1–8.1)

## 2022-04-23 LAB — LIPID PANEL
Cholesterol: 176 mg/dL (ref <200)
HDL: 82 mg/dL (ref >=40)
LDL Cholesterol (Calc): 71 mg/dL (calc)
Non-HDL Cholesterol (Calc): 94 mg/dL (calc) (ref <130)
Total CHOL/HDL Ratio: 2.1 (calc) (ref <5.0)
Triglycerides: 142 mg/dL (ref <150)

## 2022-04-23 LAB — TSH: TSH: 1.7 mIU/L (ref 0.40–4.50)

## 2022-04-23 LAB — HEMOGLOBIN A1C
Hgb A1c MFr Bld: 5.2 % of total Hgb (ref <5.7)
Mean Plasma Glucose: 103 mg/dL
eAG (mmol/L): 5.7 mmol/L

## 2022-05-02 ENCOUNTER — Telehealth: Payer: Self-pay | Admitting: Adult Health

## 2022-05-02 NOTE — Telephone Encounter (Signed)
Pt returning call regarding lab results

## 2022-05-03 NOTE — Telephone Encounter (Signed)
Left message to return phone call.

## 2022-05-04 NOTE — Telephone Encounter (Signed)
Patient notified of labs  and verbalized understanding.

## 2022-05-16 ENCOUNTER — Ambulatory Visit: Payer: PPO | Admitting: Family Medicine

## 2022-05-16 ENCOUNTER — Encounter: Payer: Self-pay | Admitting: Family Medicine

## 2022-05-16 VITALS — BP 172/90 | HR 78 | Ht 71.0 in | Wt 199.0 lb

## 2022-05-16 DIAGNOSIS — I63113 Cerebral infarction due to embolism of bilateral vertebral arteries: Secondary | ICD-10-CM

## 2022-05-16 DIAGNOSIS — G40909 Epilepsy, unspecified, not intractable, without status epilepticus: Secondary | ICD-10-CM

## 2022-05-16 MED ORDER — LAMOTRIGINE 100 MG PO TABS: ORAL_TABLET | ORAL | 3 refills | Status: DC

## 2022-05-16 NOTE — Progress Notes (Signed)
Chief Complaint  Patient presents with   Follow-up    Rm 3 NCV rm, pt alone, states things are stable. States no issues. Last seizure several years ago.     HISTORY OF PRESENT ILLNESS:  05/16/22 ALL:  [name] is a 76 y.o. male here today for follow up for seizures. He continues lamotrigine '100mg'$  in am and '200mg'$  at bedtime. No recent seizures. Last known seizure in 2012. He is tolerating meds well.   He is followed by oncology for metastatic prostate cancer on Lupron/enzalutamide. He is followed closely by PCP. He continues asa and rosuvastatin for stroke prevention. He is seen regularly.    HISTORY (copied from Dr Gladstone Lighter previous note)  UPDATE (08/24/20, VRP): Since last visit, doing well. Symptoms are stable. No alleviating or aggravating factors. Tolerating meds. No seizures. 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    UPDATE (03/05/18, VRP): Since last visit, doing well. Tolerating meds. No alleviating or aggravating factors. No seizures.   UPDATE 02/01/17: Since last visit, PSA has gone up and may need more treatment. No seizures. Has noticed fine tremor in bilateral hands x 6-8 months. Paternal grandfather and uncle had parkinsons.    UPDATE 11/17/15: Since last visit, doing well. No seizures. Some mild balance issues across open parking lots, but no major falls or problems. Completed prostate radiation.    UPDATE 10/13/14: Since last visit, now on prostate CA therapy (radiation and hormone). No seizures. No new stroke like events. Overall stable neurologically.    UPDATE 10/11/13: Since last visit, stable. No seizures since Dec 2012. Still with some balance difficulty. Tolerating LTG.   PRIOR HPI (12/17/11, [name]): 76 year old right-handed white divorced male with a history of episodes in which his mind would suddenly blankout beginning in 1984.  It would be an unusual sensation and during the episodes he could not respond. He had  an episode while on a plane trip to [name] 10[place]1992 with a weird sensation and then a generalized major motor seizure.  The plane landed in [place]  and he was evaluated at the [name]. CAT scan without dye, EKG, CBC, SMA 6, SMA 12, magnesium level were normal and  he was placed on Dilantin 300 mg per day. A second generalized major motor seizure occurred 04/1993 and  I first saw him 05/31/1993. He was drinking 42 beers per week. Because the seizures were simple partial and complex partial with secondary generalization. It was felt his seizures might not  be related to alcohol. 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 He was placed on Dilantin medication and subsequently switched to Lamictal in 2006. He  tried to taper lamotrigine several times and  had generalized major motor seizures. This occurred in 2009. He had a CAT scan for evaluation and a bone density scan 12/06/2004. The foreign body was removed from the right orbit . He previously had an MRI of his lumbar spine 12/13/2006 showing severe canal stenosis at L4-5 anterior listhesis of 4-5 mm and severe facet hypertrophy and a large synovial cyst emanating from the left facet joint. There were also degenerative changes on the right at L5-S1 and facet hypertrophy at L5-S1. There was  disc bulging at L3-4.  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 He underwent radiation therapy and chemotherapy at [name]. CT of the neck with contrast 07/12/11 showed no evidence of recurrent  disease. 7/12 he had a nocturnal generalized seizure. He awoke at night having bitten his tongue.  He had been trying to taper lamotrigine. 12/13/10 while [place] the [place] and drinking beer regularly during the  the day he had a seizure.  08/29/11 he had  another seizure.  In November, he  had persistent left-sided headache occurring on a daily basis. Wednesday 09/28/11, he was eating breakfast with friends in a restaurant and noted the onset of the left sided headache with numbness in the left forehead extending down the left side of his face and then involving his entire body with a  tingling sensation or numbness,worse on the left side of his body than the right. He became weak in both arms and both legs and slumped in his chair.  911 was called and he was seen at [name]. CT scan of the brain without contrast was unremarkable and MRI study of the brain without and with contrast which was reviewed by Dr. Patton Salles was negative for acute stroke. At home he noted falling to his left, double vision, and oscillopsia. He did  have hiccups. 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  CT angiogram showed no evidence of posterior circulation stenosis or occlusion and TEE showed normal EF and no emboli source. In the [name] he had a seizure and Lamictal was increas to 100 mg AM and 200 mg PM.He was improving  but 2/9 after having one beer he noticed dizziness and blurred vision  with vertigo and decreased balance. He was seen in the [name] ER 12/04/11 MRI showing no evidence of acute stroke. He has slowly improved. He denies palpitations. He has spinning, double vision, hiccups,numbness left face and right body, and balance problems. He denies auras of seizures.   REVIEW OF SYSTEMS: Out of a complete 14 system review of symptoms, the patient complains only of the following symptoms, fatigue and all other reviewed systems are negative.   ALLERGIES: Allergies  Allergen Reactions   Morphine And Related Nausea And Vomiting     HOME MEDICATIONS: Outpatient Medications Prior to Visit  Medication Sig Dispense Refill    amLODipine (NORVASC) 5 MG tablet TAKE 1 TABLET(5 MG) BY MOUTH DAILY 30 tablet 3   aspirin EC 81 MG tablet Take 81 mg by mouth daily. Swallow whole.     calcium-vitamin D (OSCAL WITH D) 500-200 MG-UNIT tablet Take 1 tablet by mouth 2 (two) times daily. '1500mg'$  / 200unit tablet     chlorhexidine (PERIDEX) 0.12 % solution RINSE AND GARGLE 15 ML BY MOUTH OR THROAT TWICE DAILY 473 mL 11   Cyanocobalamin (B-12 PO) Take by mouth.     Leuprolide Acetate, 6 Month, (LUPRON DEPOT, 41-MONTH,) 45 MG injection Inject 45 mg into the muscle every 6 (six) months.     PREVIDENT 5000 BOOSTER PLUS 1.1 % PSTE Place onto teeth.     rosuvastatin (CRESTOR) 20 MG tablet 1 tablet     valsartan (DIOVAN) 160 MG tablet TAKE 1 TABLET(160 MG) BY MOUTH TWICE DAILY 90 tablet 0   lamoTRIgine (LAMICTAL) 100 MG tablet TAKE 1 TABLET BY MOUTH DAILY AND 2 TABLETS AT BEDTIME 270 tablet 1   enzalutamide (XTANDI) 40  MG tablet Take 4 tablets (160 mg total) by mouth daily. 120 tablet 5   No facility-administered medications prior to visit.     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 - HE RETIRED AND PT LAST SAW DR. PENUMALLI   Stroke ([place]) DEC 2013   UNABLE TO SPEAK OR MOVE AND RT SIDE WEAKNESS AND LOSS OF SKIN SENSITIVITY TO HEAT  AND COLD ON RT SIDE, DOUBLE VISION. BALANCE PROBLEMS---STATES STILL HAS DOUBLE VISION AND BALANCE PROBLEM AND RT SIDED LOSS OF SKIN SENSITIVITY     PAST SURGICAL HISTORY: Past Surgical History:  Procedure Laterality Date   CORONARY ARTERY BYPASS GRAFT N/A 03/27/2020   Procedure: CORONARY ARTERY BYPASS GRAFTING (CABG) x Three, using left internal mammary artery and right leg greater saphenous vein harvested endoscopically;  Surgeon: Ivin Poot, MD;  Location: [facility];  Service: Open Heart Surgery;  Laterality: N/A;   hydrocelectomy  11/2000   left   IR FLUORO GUIDE CV LINE RIGHT  08/31/2017   IR GASTROSTOMY TUBE MOD SED  04/07/2020   IR GASTROSTOMY TUBE REMOVAL  07/20/2020   IR US GUIDE VASC ACCESS RIGHT  08/31/2017   JOINT REPLACEMENT     LEFT TOTAL KNEE ARTHROPLASTY   MOUTH SURGERY  2019   NEPHRECTOMY  1990's   left   NEPHRECTOMY     PROSTATE BIOPSY  05/20/14   Gleason 4+3=7, vol 25 gm   RIGHT/LEFT HEART CATH AND CORONARY ANGIOGRAPHY N/A 03/24/2020   Procedure: RIGHT/LEFT HEART CATH AND CORONARY ANGIOGRAPHY;  Surgeon: Burnell Blanks, MD;  Location: [facility];  Service: Cardiovascular;  Laterality: N/A;   TEE WITHOUT CARDIOVERSION  10/04/2011   Procedure: TRANSESOPHAGEAL ECHOCARDIOGRAM (TEE);  Surgeon: Candee Furbish, MD;  [name];  Service: Cardiovascular;  Laterality: N/A;   TEE WITHOUT CARDIOVERSION N/A 03/27/2020   Procedure: TRANSESOPHAGEAL ECHOCARDIOGRAM (TEE);  Surgeon: Prescott Gum, Collier Salina, MD;  Location: [facility];  Service: Open Heart Surgery;  Laterality: N/A;   TOTAL KNEE ARTHROPLASTY  2011   left   TOTAL KNEE ARTHROPLASTY Right 02/24/2014   Procedure: RIGHT TOTAL KNEE ARTHROPLASTY;  Surgeon: Gearlean Alf, MD;  Location: [facility];  Service: Orthopedics;  Laterality: Right;     FAMILY HISTORY: Family History  Problem Relation Age of Onset   Diabetes Mother    Heart disease Mother    Heart disease Father      SOCIAL HISTORY: Social History    Socioeconomic History   Marital status: Divorced    Spouse name: Not on file   Number of children: 2  Years of education: 12th   Highest education level: Not on file  Occupational History   Occupation: Information systems manager: OTHER    Comment: [employer]  Tobacco Use   Smoking status: Never   Smokeless tobacco: Never  Vaping Use   Vaping Use: Never used  Substance and Sexual Activity   Alcohol use: Yes    Alcohol/week: 3.0 standard drinks of alcohol    Types: 3 Cans of beer per week    Comment: 3 beers a day    Drug use: No   Sexual activity: Not Currently  Other Topics Concern   Not on file  Social History Narrative   Patient is divorced with 2 children.   Patient is right handed.   Patient has hs education.   Patient drinks 2 cups daily.   Social Determinants of [name]   Financial Resource Strain: Low Risk  (04/14/2022)   Overall Financial Resource Strain (CARDIA)    Difficulty of Paying Living Expenses: Not hard at all  Food Insecurity: No Food Insecurity (04/14/2022)   Hunger Vital Sign    Worried About Running Out of Food in the Last Year: Never true    Ran Out of Food in the Last Year: Never true  Transportation Needs: No Transportation Needs (04/14/2022)   PRAPARE - Hydrologist (Medical): No    Lack of Transportation (Non-Medical): No  Physical Activity: Inactive (04/14/2022)   Exercise Vital Sign    Days of Exercise per Week: 0 days    Minutes of Exercise per Session: 0 min  Stress: No Stress Concern Present (04/14/2022)   [place]    Feeling of Stress : Not at all  Social Connections: Socially Isolated (04/14/2022)   Social Connection and Isolation Panel [NHANES]    Frequency of Communication with Friends and Family: More than three times a week    Frequency of Social Gatherings with Friends and Family: More than three times a week    Attends Religious  Services: Never    Marine scientist or Organizations: No    Attends Archivist Meetings: Never    Marital Status: Divorced  Human resources officer Violence: Not At Risk (04/14/2022)   Humiliation, Afraid, Rape, and Kick questionnaire    Fear of Current or Ex-Partner: No    Emotionally Abused: No    Physically Abused: No    Sexually Abused: No     PHYSICAL EXAM  Vitals:   05/16/22 1351  BP: (!) 172/90  Pulse: 78  Weight: 199 lb (90.3 kg)  Height: '5\' 11"'$  (1.803 m)   Body mass index is 27.75 kg/m.  Generalized: Well developed, in no acute distress  Cardiology: normal rate and rhythm, no murmur auscultated  Respiratory: clear to auscultation bilaterally    Neurological examination  Mentation: Alert oriented to time, place, history taking. Follows all commands speech and language fluent Cranial nerve II-XII: Pupils were equal round reactive to light. Extraocular movements were full, visual field were full on confrontational test. Facial sensation and strength were normal.  Head turning and shoulder shrug  were normal and symmetric. Motor: The motor testing reveals 5 over 5 strength of all 4 extremities. Good symmetric motor tone is noted throughout.  Gait and station: Gait is normal.    DIAGNOSTIC DATA (LABS, IMAGING, TESTING) - I reviewed patient records, labs, notes, testing and imaging myself where available.  Lab Results  Component  Value Date   WBC 7.3 04/22/2022   HGB 12.3 (L) 04/22/2022   HCT 35.6 (L) 04/22/2022   MCV 91.3 04/22/2022   PLT 256 04/22/2022      Component Value Date/Time   NA 130 (L) 04/22/2022 1606   NA 134 (A) 03/11/2022 0000   K 4.7 04/22/2022 1606   CL 93 (L) 04/22/2022 1606   CO2 27 04/22/2022 1606   GLUCOSE 97 04/22/2022 1606   BUN 26 (H) 04/22/2022 1606   BUN 21 03/11/2022 0000   CREATININE 1.17 04/22/2022 1606   CALCIUM 9.8 04/22/2022 1606   PROT 7.2 04/22/2022 1606   ALBUMIN 4.5 03/11/2022 0000   AST 17 04/22/2022 1606    ALT 7 (L) 04/22/2022 1606   ALKPHOS 81 03/11/2022 0000   BILITOT 0.5 04/22/2022 1606   GFRNONAA 69 09/29/2020 1326   GFRAA 80 09/29/2020 1326   Lab Results  Component Value Date   CHOL 176 04/22/2022   HDL 82 04/22/2022   LDLCALC 71 04/22/2022   TRIG 142 04/22/2022   CHOLHDL 2.1 04/22/2022   Lab Results  Component Value Date   HGBA1C 5.2 04/22/2022   Lab Results  Component Value Date   IRCVELFY10 175 03/21/2020   Lab Results  Component Value Date   TSH 1.70 04/22/2022        No data to display               No data to display           ASSESSMENT AND PLAN  76 y.o. 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 He will continue lamotrigine '100mg'$  in am and '200mg'$  at bedtime. Labs reviewed in Epic. Healthy lifestyle habits encouraged. He will follow up with PCP and care team as directed. He will return to see me in 1 year, sooner if needed. He verbalizes understanding and agreement with this plan.   No orders of the defined types were placed in this encounter.    Meds ordered this encounter  Medications   lamoTRIgine (LAMICTAL) 100 MG tablet    Sig: TAKE 1 TABLET BY MOUTH DAILY AND 2 TABLETS AT BEDTIME    Dispense:  270 tablet    Refill:  3    Patient will call when ready for refill    Order Specific Question:   Supervising Provider    Answer:   Melvenia Beam [[id number]]     Debbora Presto, MSN, FNP-C 05/16/2022, 2:10 PM  [facility] [address] [place] [address] [place], [place] [address] [phone]

## 2022-05-16 NOTE — Patient Instructions (Addendum)
Below is our plan:  We will continue lamotrigine '100mg'$  in the morning and '200mg'$  at bedtime.   Tell your grandson to have his primary care provider send a referral to Dr April Manson with our office.   Please make sure you are staying well hydrated. I recommend 50-60 ounces daily. Well balanced diet and regular exercise encouraged. Consistent sleep schedule with 6-8 hours recommended.   Please continue follow up with care team as directed.   Follow up with me in 1 year   You may receive a survey regarding today's visit. I encourage you to leave honest feed back as I do use this information to improve patient care. Thank you for seeing me today!

## 2022-06-10 ENCOUNTER — Other Ambulatory Visit: Payer: Self-pay | Admitting: Pharmacist

## 2022-06-10 ENCOUNTER — Inpatient Hospital Stay: Payer: PPO | Attending: Oncology

## 2022-06-10 DIAGNOSIS — C61 Malignant neoplasm of prostate: Secondary | ICD-10-CM | POA: Insufficient documentation

## 2022-06-10 DIAGNOSIS — Z5111 Encounter for antineoplastic chemotherapy: Secondary | ICD-10-CM | POA: Diagnosis present

## 2022-06-10 DIAGNOSIS — C7951 Secondary malignant neoplasm of bone: Secondary | ICD-10-CM | POA: Insufficient documentation

## 2022-06-10 LAB — PSA: Prostatic Specific Antigen: 14.1 ng/mL — ABNORMAL HIGH (ref 0.00–4.00)

## 2022-06-12 NOTE — Progress Notes (Signed)
[facility]  [address] [place],    [address] [phone]  Clinic Day:  06/13/2022  Referring physician: Dorothyann Peng, NP  HISTORY OF PRESENT ILLNESS:  The patient is a 76 y.o. 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  The patient is on Lupron/enzalutimide for complete androgen blockade. The patient claims to still be tolerating his oral enzalutamide therapy very well.  He continues to deny having any bone pain or other systemic symptoms which concern him for overt signs of disease progression.    PHYSICAL EXAM:  Blood pressure (!) 172/79, pulse 77, temperature 98.4 F (36.9 C), resp. rate 16, height '5\' 11"'$  (1.803 m), weight 200 lb 4.8 oz (90.9 kg), SpO2 97 %. Wt Readings from Last 3 Encounters:  06/13/22 200 lb 1.9 oz (90.8 kg)  06/13/22 200 lb 4.8 oz (90.9 kg)  05/16/22 199 lb (90.3 kg)   Body mass index is 27.94 kg/m. Performance status (ECOG): 1 Physical Exam Constitutional:      Appearance: Normal appearance. He is not ill-appearing.  HENT:     Mouth/Throat:     Mouth: Mucous membranes are moist.     Pharynx: Oropharynx is clear. No oropharyngeal exudate or posterior oropharyngeal erythema.  Cardiovascular:     Rate and Rhythm: Normal rate and regular rhythm.     Heart sounds: No murmur heard.    No friction rub. No gallop.  Pulmonary:     Effort: Pulmonary effort is normal. No respiratory distress.     Breath sounds: Normal breath sounds. No wheezing, rhonchi or rales.  Abdominal:     General: Bowel sounds are normal. There is no distension.     Palpations: Abdomen is soft. There is no mass.     Tenderness: There is no abdominal tenderness.  Musculoskeletal:        General: No swelling.     Right lower leg: No edema.     Left lower leg: No edema.  Lymphadenopathy:     Cervical: No cervical adenopathy.     Upper Body:     Right upper body: No supraclavicular or axillary  adenopathy.     Left upper body: No supraclavicular or axillary adenopathy.     Lower Body: No right inguinal adenopathy. No left inguinal adenopathy.  Skin:    General: Skin is warm.     Coloration: Skin is not jaundiced.     Findings: No lesion or rash.  Neurological:     General: No focal deficit present.     Mental Status: He is alert and oriented to person, place, and time. Mental status is at baseline.  Psychiatric:        Mood and Affect: Mood normal.        Behavior: Behavior normal.        Thought Content: Thought content normal.     LABS:     Latest Reference Range & Units 12/14/21 13:58 03/11/22 10:39 06/10/22 14:09  Prostate Specific Ag, Serum 0.0 - 4.0 ng/mL  9.5 (H)   Prostatic Specific Antigen 0.00 - 4.00 ng/mL 7.88 (H)  14.10 (H)  (H): Data is abnormally high  ASSESSMENT & PLAN:  Assessment/Plan:  A 76 y.o. 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  Unfortunately, over these past months, this gentleman's PSA level has continued to rise despite being compliant with his Lupron/enzalutamide regimen.  Based upon this, I will order a PSMA  PET scan in the forthcoming weeks to check for disease progression.  I will see him back in 3 weeks to go over these PET scan images under implications.  If progression is seen, then I will likely switch him to abiraterone/prednisone for his disease management.  The patient understands all the plans discussed today and is in agreement with them.    Kishon Garriga Macarthur Critchley, MD

## 2022-06-13 ENCOUNTER — Inpatient Hospital Stay: Payer: PPO | Admitting: Oncology

## 2022-06-13 ENCOUNTER — Other Ambulatory Visit: Payer: Self-pay | Admitting: Oncology

## 2022-06-13 ENCOUNTER — Inpatient Hospital Stay: Payer: PPO

## 2022-06-13 VITALS — BP 172/79 | HR 77 | Temp 98.4°F | Resp 16 | Ht 71.0 in | Wt 200.3 lb

## 2022-06-13 VITALS — BP 137/75 | HR 76 | Temp 98.1°F | Resp 18 | Wt 200.1 lb

## 2022-06-13 DIAGNOSIS — C61 Malignant neoplasm of prostate: Secondary | ICD-10-CM | POA: Diagnosis not present

## 2022-06-13 DIAGNOSIS — Z5111 Encounter for antineoplastic chemotherapy: Secondary | ICD-10-CM | POA: Diagnosis not present

## 2022-06-13 MED ORDER — LEUPROLIDE ACETATE (3 MONTH) 22.5 MG IM KIT
22.5000 mg | PACK | Freq: Once | INTRAMUSCULAR | Status: AC
  Administered 2022-06-13: 22.5 mg via INTRAMUSCULAR
  Filled 2022-06-13: qty 22.5

## 2022-06-13 NOTE — Patient Instructions (Signed)
Leuprolide Solution for Injection What is this medication? LEUPROLIDE (loo PROE lide) reduces the symptoms of prostate cancer. It works by decreasing levels of the hormone testosterone in the body. This prevents prostate cancer cells from spreading or growing. This medicine may be used for other purposes; ask your health care provider or pharmacist if you have questions. COMMON BRAND NAME(S): Lupron What should I tell my care team before I take this medication? They need to know if you have any of these conditions: Diabetes Heart attack Heart disease High blood pressure High cholesterol Pain or difficulty passing urine Spinal cord metastasis Stroke Tobacco use An unusual or allergic reaction to leuprolide, other medications, foods, dyes, or preservatives Pregnant or trying to get pregnant Breast-feeding How should I use this medication? This medication is for injection under the skin or into a muscle. You will be taught how to prepare and give this medication. Use exactly as directed. Take your medication at regular intervals. Do not take it more often than directed. It is important that you put your used needles and syringes in a special sharps container. Do not put them in a trash can. If you do not have a sharps container, call your care team to get one. A special MedGuide will be given to you by the pharmacist with each prescription and refill. Be sure to read this information carefully each time. Talk to your care team about the use of this medication in children. While this medication may be prescribed for children as young as 8 years for selected conditions, precautions do apply. Overdosage: If you think you have taken too much of this medicine contact a poison control center or emergency room at once. NOTE: This medicine is only for you. Do not share this medicine with others. What if I miss a dose? If you miss a dose, take it as soon as you can. If it is almost time for your next  dose, take only that dose. Do not take double or extra doses. What may interact with this medication? Do not take this medication with any of the following: Chasteberry Cisapride Dronedarone Pimozide Thioridazine This medication may also interact with the following: Estrogen or progestin hormones Herbal or dietary supplements, like black cohosh or DHEA Other medications that cause heart rhythm changes Testosterone This list may not describe all possible interactions. Give your health care provider a list of all the medicines, herbs, non-prescription drugs, or dietary supplements you use. Also tell them if you smoke, drink alcohol, or use illegal drugs. Some items may interact with your medicine. What should I watch for while using this medication? Visit your care team for regular checks on your progress. During the first week, your symptoms may get worse, but then will improve as you continue your treatment. You may get hot flashes, increased bone pain, increased difficulty passing urine, or an aggravation of nerve symptoms. Discuss these effects with your care team, some of them may improve with continued use of this medication. Patients may experience a menstrual cycle or spotting during the first 2 months of therapy with this medication. If this continues, contact your care team. This medication may increase blood sugar. The risk may be higher in patients who already have diabetes. Ask your care team what you can do to lower your risk of diabetes while taking this medication. What side effects may I notice from receiving this medication? Side effects that you should report to your care team as soon as possible: Allergic reactions--skin rash,  itching, hives, swelling of the face, lips, tongue, or throat Heart attack--pain or tightness in the chest, shoulders, arms, or jaw, nausea, shortness of breath, cold or clammy skin, feeling faint or lightheaded Heart rhythm changes--fast or irregular  heartbeat, dizziness, feeling faint or lightheaded, chest pain, trouble breathing High blood sugar (hyperglycemia)--increased thirst or amount of urine, unusual weakness or fatigue, blurry vision Mood swings, irritability, hostility Seizures Stroke--sudden numbness or weakness of the face, arm, or leg, trouble speaking, confusion, trouble walking, loss of balance or coordination, dizziness, severe headache, change in vision Thoughts of suicide or self-harm, worsening mood, feelings of depression Side effects that usually do not require medical attention (report to your care team if they continue or are bothersome): Bone pain Change in sex drive or performance General discomfort and fatigue Hot flashes Muscle pain Pain, redness, or irritation at injection site Swelling of the ankles, hands, or feet This list may not describe all possible side effects. Call your doctor for medical advice about side effects. You may report side effects to FDA at 1-800-FDA-1088. Where should I keep my medication? Keep out of the reach of children and pets. Store below 25 degrees C (77 degrees F). Do not freeze. Protect from light. Get rid of any unused medication after the expiration date. To get rid of medications that are no longer needed or have expired: Take the medication to a medication take-back program. Check with your pharmacy or law enforcement to find a location. If you cannot return the medication, ask your pharmacist or care team how to get rid of this medication safely. NOTE: This sheet is a summary. It may not cover all possible information. If you have questions about this medicine, talk to your doctor, pharmacist, or health care provider.  2023 Elsevier/Gold Standard (2021-09-10 00:00:00)  

## 2022-07-04 ENCOUNTER — Other Ambulatory Visit: Payer: Self-pay | Admitting: Internal Medicine

## 2022-07-04 ENCOUNTER — Encounter (HOSPITAL_COMMUNITY)
Admission: RE | Admit: 2022-07-04 | Discharge: 2022-07-04 | Disposition: A | Discharge status: Home / Self Care | Payer: PPO | Source: Ambulatory Visit | Attending: Oncology | Admitting: Oncology

## 2022-07-04 DIAGNOSIS — C61 Malignant neoplasm of prostate: Secondary | ICD-10-CM | POA: Diagnosis present

## 2022-07-04 MED ORDER — PIFLIFOLASTAT F 18 (PYLARIFY) INJECTION
9.0000 | Freq: Once | INTRAVENOUS | Status: AC
  Administered 2022-07-04: 8.19 via INTRAVENOUS

## 2022-07-04 NOTE — Progress Notes (Incomplete)
[facility]  [address] [place],  [place]  [address] [phone]  Clinic Day:  06/13/2022  Referring physician: Dorothyann Peng, NP  HISTORY OF PRESENT ILLNESS:  The patient is a 76 y.o. 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  The patient is on Lupron/enzalutimide for complete androgen blockade. The patient claims to still be tolerating his oral enzalutamide therapy very well.  He continues to deny having any bone pain or other systemic symptoms which concern him for overt signs of disease progression.    PHYSICAL EXAM:  There were no vitals taken for this visit. Wt Readings from Last 3 Encounters:  06/13/22 200 lb 1.9 oz (90.8 kg)  06/13/22 200 lb 4.8 oz (90.9 kg)  05/16/22 199 lb (90.3 kg)   There is no height or weight on file to calculate BMI. Performance status (ECOG): 1 Physical Exam Constitutional:      Appearance: Normal appearance. He is not ill-appearing.  HENT:     Mouth/Throat:     Mouth: Mucous membranes are moist.     Pharynx: Oropharynx is clear. No oropharyngeal exudate or posterior oropharyngeal erythema.  Cardiovascular:     Rate and Rhythm: Normal rate and regular rhythm.     Heart sounds: No murmur heard.    No friction rub. No gallop.  Pulmonary:     Effort: Pulmonary effort is normal. No respiratory distress.     Breath sounds: Normal breath sounds. No wheezing, rhonchi or rales.  Abdominal:     General: Bowel sounds are normal. There is no distension.     Palpations: Abdomen is soft. There is no mass.     Tenderness: There is no abdominal tenderness.  Musculoskeletal:        General: No swelling.     Right lower leg: No edema.     Left lower leg: No edema.  Lymphadenopathy:     Cervical: No cervical adenopathy.     Upper Body:     Right upper body: No supraclavicular or axillary adenopathy.     Left upper body: No supraclavicular or axillary adenopathy.     Lower  Body: No right inguinal adenopathy. No left inguinal adenopathy.  Skin:    General: Skin is warm.     Coloration: Skin is not jaundiced.     Findings: No lesion or rash.  Neurological:     General: No focal deficit present.     Mental Status: He is alert and oriented to person, place, and time. Mental status is at baseline.  Psychiatric:        Mood and Affect: Mood normal.        Behavior: Behavior normal.        Thought Content: Thought content normal.     LABS:     Latest Reference Range & Units 12/14/21 13:58 03/11/22 10:39 06/10/22 14:09  Prostate Specific Ag, Serum 0.0 - 4.0 ng/mL  9.5 (H)   Prostatic Specific Antigen 0.00 - 4.00 ng/mL 7.88 (H)  14.10 (H)  (H): Data is abnormally high  ASSESSMENT & PLAN:  Assessment/Plan:  A 76 y.o. 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  Unfortunately, over these past months, this gentleman's PSA level has continued to rise despite being compliant with his Lupron/enzalutamide regimen.  Based upon this, I will order a PSMA PET scan in the forthcoming weeks to check for disease progression.  I will see him  back in 3 weeks to go over these PET scan images under implications.  If progression is seen, then I will likely switch him to abiraterone/prednisone for his disease management.  The patient understands all the plans discussed today and is in agreement with them.    Aliyana Dlugosz Macarthur Critchley, MD

## 2022-07-04 NOTE — Progress Notes (Signed)
[facility]  [address] [place],  [place]  [address] [phone]  Clinic Day:  07/05/2022  Referring physician: Dorothyann Peng, NP  HISTORY OF PRESENT ILLNESS:  The patient is a 76 y.o. 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  The patient has been on Lupron/enzalutimide for complete androgen blockade.  However, this gentleman's PSA level has progressively risen to where a PSMA-PET scan was done recently to determine if there is any radiographic evidence of disease progression.  He comes in today to go over the scans and implications.  Since his last visit, the patient's been doing okay.  He has noticed some left-sided scrotal discomfort, particularly when he lays on the side of his head at night.  He denies having other symptoms or findings which concerning for overt signs of disease progression.  PHYSICAL EXAM:  Blood pressure (!) 165/79, pulse 82, temperature 97.6 F (36.4 C), resp. rate 16, height '5\' 11"'$  (1.803 m), weight 202 lb 11.2 oz (91.9 kg), SpO2 96 %. Wt Readings from Last 3 Encounters:  07/05/22 202 lb 11.2 oz (91.9 kg)  06/13/22 200 lb 1.9 oz (90.8 kg)  06/13/22 200 lb 4.8 oz (90.9 kg)   Body mass index is 28.27 kg/m. Performance status (ECOG): 1 Physical Exam Constitutional:      Appearance: Normal appearance. He is not ill-appearing.  HENT:     Mouth/Throat:     Mouth: Mucous membranes are moist.     Pharynx: Oropharynx is clear. No oropharyngeal exudate or posterior oropharyngeal erythema.  Cardiovascular:     Rate and Rhythm: Normal rate and regular rhythm.     Heart sounds: No murmur heard.    No friction rub. No gallop.  Pulmonary:     Effort: Pulmonary effort is normal. No respiratory distress.     Breath sounds: Normal breath sounds. No wheezing, rhonchi or rales.  Abdominal:     General: Bowel sounds are normal. There is no distension.     Palpations: Abdomen is soft. There  is no mass.     Tenderness: There is no abdominal tenderness.  Musculoskeletal:        General: No swelling.     Right lower leg: No edema.     Left lower leg: No edema.  Lymphadenopathy:     Cervical: No cervical adenopathy.     Upper Body:     Right upper body: No supraclavicular or axillary adenopathy.     Left upper body: No supraclavicular or axillary adenopathy.     Lower Body: No right inguinal adenopathy. No left inguinal adenopathy.  Skin:    General: Skin is warm.     Coloration: Skin is not jaundiced.     Findings: No lesion or rash.  Neurological:     General: No focal deficit present.     Mental Status: He is alert and oriented to person, place, and time. Mental status is at baseline.  Psychiatric:        Mood and Affect: Mood normal.        Behavior: Behavior normal.        Thought Content: Thought content normal.   SCANS: His PSMA-PET scan done yesterday revealed the following: FINDINGS: NECK  No radiotracer activity in neck lymph nodes.  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 Soft tissue fullness in the LEFT neck and  signs of prior mandibular fracture post ORIF. Comparison imaging from 2021 showed fracture in this area and soft tissue fullness and loss of normal tissue planes with grossly similar appearance.  CHEST  No radiotracer accumulation within mediastinal or hilar lymph nodes. No suspicious pulmonary nodules on the CT scan.  Incidental CT finding: Post median sternotomy with extensive native coronary calcification and changes of CABG. Heart size moderately enlarged. No pericardial effusion or nodularity. No adenopathy by size criteria in the chest. Basilar atelectasis. No consolidation. No pleural effusion.  ABDOMEN/PELVIS  Prostate: No focal activity in the prostate bed.  Lymph nodes: No abnormal radiotracer accumulation within pelvic  or abdominal nodes.  Liver: No evidence of liver metastasis.  Incidental CT finding: No acute findings relative to liver, gallbladder, pancreas, spleen or gastrointestinal tract. Colonic diverticulosis.  Post LEFT nephrectomy and adrenalectomy. Aortic atherosclerosis without aneurysmal dilation. Fiducial markers in the prostate.  SKELETON  T3-T4 sclerosis and lucency increased in conspicuity compared to previous imaging with marked marked radiotracer accumulation. At T3 the vertebral body shows a maximum SUV of 19.4 involving the entire vertebral body.  At T4 more pronounced involvement of the RIGHT posterolateral vertebral body and pedicle as well as LEFT anterolateral vertebral body with similar marked increased radiotracer accumulation in the RIGHT vertebral body and slightly less pronounced radiotracer accumulation on the LEFT  RIGHT lateral fifth rib without increased radiotracer accumulation. No additional areas to indicate metastatic involvement with active features at this time.  IMPRESSION: Post radiotherapy to the prostate with signs of skeletal metastases without new sites of involvement but with considerable radiotracer accumulation indicating active sites of metastatic disease at T3 and T4. No signs of visceral or nodal involvement.  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  Post LEFT nephrectomy and adrenalectomy.  LABS:    Latest Reference Range & Units 12/14/21 13:58 03/11/22 10:39 06/10/22 14:09  Prostate Specific Ag, Serum 0.0 - 4.0 ng/mL  9.5 (H)   Prostatic Specific Antigen 0.00 - 4.00 ng/mL 7.88 (H)  14.10 (H)  (H): Data is abnormally high  ASSESSMENT & PLAN:  Assessment/Plan:  A 76 y.o. 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  In clinic today, I went over his PET scan images with him, for which he could see the  increased activity along his thoracic spine.  Although these areas were seen on past scans, the fact that they are extremely hypermetabolic and his PSA is rising suggest that his current regimen of Lupron/enzalutamide is not doing enough to keep this disease in check.  Based upon this, I will switch him to Lupron/abiraterone/prednisone.  Abiraterone will be given at 1000 mg daily; prednisone be given at 5 mg twice daily.  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  Currently, it does not appear that he is particularly symptomatic from his metastatic disease.  I am somewhat concerned that the metastatic disease in his thoracic spine may be contributing to his left-sided bony headaches.  This symptom will continue to be followed over time.  Otherwise, I will see this gentleman back in 6 weeks for repeat clinical assessment.  The patient understands all the plans discussed today and is in agreement with them.    Sai Zinn Macarthur Critchley, MD

## 2022-07-05 ENCOUNTER — Inpatient Hospital Stay: Payer: PPO | Attending: Oncology | Admitting: Oncology

## 2022-07-05 ENCOUNTER — Encounter: Payer: Self-pay | Admitting: Oncology

## 2022-07-05 ENCOUNTER — Telehealth: Payer: Self-pay

## 2022-07-05 ENCOUNTER — Other Ambulatory Visit (HOSPITAL_COMMUNITY): Payer: Self-pay

## 2022-07-05 ENCOUNTER — Other Ambulatory Visit: Payer: Self-pay | Admitting: Oncology

## 2022-07-05 VITALS — BP 165/79 | HR 82 | Temp 97.6°F | Resp 16 | Ht 71.0 in | Wt 202.7 lb

## 2022-07-05 DIAGNOSIS — C61 Malignant neoplasm of prostate: Secondary | ICD-10-CM | POA: Diagnosis not present

## 2022-07-05 MED ORDER — ABIRATERONE ACETATE 250 MG PO TABS
1000.0000 mg | ORAL_TABLET | Freq: Every day | ORAL | 5 refills | Status: DC
  Filled 2022-07-05 – 2022-07-06 (×2): qty 120, 30d supply, fill #0
  Filled 2022-07-28: qty 120, 30d supply, fill #1
  Filled 2022-08-23: qty 120, 30d supply, fill #2

## 2022-07-05 MED ORDER — PREDNISONE 5 MG PO TABS: 5.0000 mg | ORAL_TABLET | Freq: Every day | ORAL | 5 refills | Status: DC

## 2022-07-06 ENCOUNTER — Other Ambulatory Visit (HOSPITAL_COMMUNITY): Payer: Self-pay

## 2022-07-06 ENCOUNTER — Telehealth: Payer: Self-pay | Admitting: Pharmacy Technician

## 2022-07-06 ENCOUNTER — Inpatient Hospital Stay: Payer: PPO

## 2022-07-06 DIAGNOSIS — C61 Malignant neoplasm of prostate: Secondary | ICD-10-CM

## 2022-07-06 NOTE — Progress Notes (Signed)
Oral Chemotherapy Pharmacist Encounter  I spoke with patient for overview of: Zytiga for the treatment of metastatic, prostate cancer in conjunction with prednisone, planned duration until disease progression or unacceptable toxicity.   Counseled patient on administration, dosing, side effects, monitoring, drug-food interactions, safe handling, storage, and disposal.  Patient will take Zytiga '250mg'$  tablets, 4 tablets ('1000mg'$ ) by mouth once daily on an empty stomach, 1 hour before or 2 hours after a meal.  Patient states he will take his Zytiga after dinner time and will wait at least 1 hour before eating.  Patient will take prednisone '5mg'$  tablet, 1 tablet by mouth twice daily with breakfast.  Zytiga start date: 07/08/2022  Adverse effects include but are not limited to: peripheral edema, GI upset, hypertension, fatigue, and arthralgias.    Prednisone prescription has been sent to [facility] but spoke to physician and new prescription needs to be sent in to [facility] with the twice daily instructions. Patient will obtain prednisone and knows to start prednisone on the same day as Zytiga start.  Reviewed with patient importance of keeping a medication schedule and plan for any missed doses. No barriers to medication adherence identified.  Medication reconciliation performed and medication/allergy list updated.  Insurance authorization for Connor [name] has been obtained. Test claim at the pharmacy revealed copayment $123.73 for 1st fill of 30 days. This will ship from the [facility] on 07/06/22 to deliver to patient's home on 07/07/22.  Patient informed the pharmacy will reach out 5-7 days prior to needing next fill of Zytiga to coordinate continued medication acquisition to prevent break in therapy.  All questions answered.  Connor [name] voiced understanding and appreciation.   Medication education handout placed in mail for patient. Patient knows to call the office  with questions or concerns. Oral Chemotherapy Clinic phone number provided to patient.   Drema Halon, PharmD Hematology/Oncology Clinical Pharmacist [facility] [phone] 07/06/2022 1:43 PM

## 2022-07-06 NOTE — Telephone Encounter (Signed)
Received New start notification for  Zytiga/Abiraterone Acetate '250MG'$   . Will update as we work through the benefits process.   Submitted a Prior Authorization request to  [employer]  for   Zytiga/Abiraterone Acetate '250MG'$     via CoverMyMeds. Will update once we receive a response.   Key: [id number] - PA Case ID: [id number]

## 2022-07-06 NOTE — Telephone Encounter (Signed)
Oral Oncology Pharmacist Encounter  Received new prescription for abiraterone (Zytiga) for the treatment of metastatic prostate cancer in conjunction with prednisone, planned duration until disease progression or unacceptable toxicity.  Labs from 68/30/23 assessed, no interventions needed. Prescription dose and frequency assessed. Will reach out to MD as prednisone script sent in for daily although note states twice daily.   Current medication list in Epic reviewed, DDIs with zytiga identified: - rosuvastatin: zytiga may increase rhabdomyolysis effect of statins. Will have patient monitor.  Evaluated chart and no patient barriers to medication adherence noted.   Patient agreement for treatment documented in MD note on 07/05/22.  Prescription has been e-scribed to the [facility] for benefits analysis and approval.  Oral Oncology Clinic will continue to follow for insurance authorization, copayment issues, initial counseling and start date.  Drema Halon, PharmD Hematology/Oncology Clinical Pharmacist [facility] [phone] 07/06/2022 11:07 AM

## 2022-07-06 NOTE — Telephone Encounter (Signed)
Shipment has been scheduled to deliver to patient on 9/14.

## 2022-07-06 NOTE — Telephone Encounter (Signed)
Oral Oncology Patient Advocate Encounter  Prior Authorization for Abiraterone Acetate '250MG'$  has been approved.    PA# Key: [id number] - PA Case ID: [id number] Effective dates: 07/06/22 through 07/07/23  Patients co-pay is $123.73  Oral Oncology Clinic will continue to follow.

## 2022-07-07 ENCOUNTER — Other Ambulatory Visit (HOSPITAL_COMMUNITY): Payer: Self-pay

## 2022-07-12 ENCOUNTER — Encounter: Payer: Self-pay | Admitting: Oncology

## 2022-07-15 ENCOUNTER — Telehealth: Payer: Self-pay

## 2022-07-15 NOTE — Telephone Encounter (Addendum)
I spoke with pt to see how he was doing since switching to Connor [name]. Pt states, "I cant really tell much difference so far. The roof of my mouth seems a little irritated because I have to let it dissolve". He denies having any actual mouth sores or white patches. He continues to take the medication between 7p-8p nightly on empty stomach. No missed doses. He denies N/V, & swelling in lower extremities. Fatigue is about the same per pt. I confirmed his next appt. Pt encouraged to call us if he has any questions/concerns. He verbalized understanding.

## 2022-07-26 ENCOUNTER — Other Ambulatory Visit (HOSPITAL_COMMUNITY): Payer: Self-pay

## 2022-07-27 ENCOUNTER — Inpatient Hospital Stay: Payer: PPO

## 2022-07-27 ENCOUNTER — Inpatient Hospital Stay: Payer: PPO | Attending: Oncology

## 2022-07-27 DIAGNOSIS — C61 Malignant neoplasm of prostate: Secondary | ICD-10-CM | POA: Diagnosis present

## 2022-07-27 LAB — COMPREHENSIVE METABOLIC PANEL
Albumin: 4.3 (ref 3.5–5.0)
Calcium: 9.3 (ref 8.7–10.7)

## 2022-07-27 LAB — CBC AND DIFFERENTIAL
HCT: 36 — AB (ref 41–53)
Hemoglobin: 12.7 — AB (ref 13.5–17.5)
Neutrophils Absolute: 5.26
Platelets: 212 10*3/uL (ref 150–400)
WBC: 7.2

## 2022-07-27 LAB — BASIC METABOLIC PANEL
BUN: 25 — AB (ref 4–21)
CO2: 25 — AB (ref 13–22)
Chloride: 93 — AB (ref 99–108)
Creatinine: 1.3 (ref 0.6–1.3)
Glucose: 119
Potassium: 4.6 mEq/L (ref 3.5–5.1)
Sodium: 125 — AB (ref 137–147)

## 2022-07-27 LAB — PSA: Prostatic Specific Antigen: 23.44 ng/mL — ABNORMAL HIGH (ref 0.00–4.00)

## 2022-07-27 LAB — HEPATIC FUNCTION PANEL
ALT: 18 U/L (ref 10–40)
AST: 29 (ref 14–40)
Alkaline Phosphatase: 112 (ref 25–125)
Bilirubin, Total: 0.8

## 2022-07-27 LAB — CBC: RBC: 3.96 (ref 3.87–5.11)

## 2022-07-28 ENCOUNTER — Other Ambulatory Visit (HOSPITAL_COMMUNITY): Payer: Self-pay

## 2022-08-01 ENCOUNTER — Telehealth: Payer: Self-pay

## 2022-08-01 NOTE — Progress Notes (Signed)
[facility]  Telephone: [phone]  Referring oncologist: Lavera Guise, MD   HISTORY OF PRESENT ILLNESS: Connor [name] is a 76 year old male who was diagnosed with metastatic prostate cancer to his bones. Patient was previously treated with Gillermina Phy until September 2023 where disease progression was discovered. He comes in today for clinic after a little under 3 weeks of therapy with the zytiga. Patient states he has bene taking the zytiga in addition to the prednisone. 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   Current treatment: Xtandi '160mg'$  daily (started 07/08/22) + Lupron  LABS:  Latest Reference Range & Units 04/22/22 16:06 07/27/22 00:00  Sodium 137 - 147  130 (L) 125 ! (E)  Potassium 3.5 - 5.1 mEq/L 4.7 4.6 (E)  Chloride 99 - 108  93 (L) 93 ! (E)  CO2 13 - '22  27 25 '$ ! (E)  Glucose 65 - 99 mg/dL 97 119 (E)  Mean Plasma Glucose mg/dL 103   BUN 4 - 21  26 (H) 25 ! (E)  Creatinine 0.6 - 1.3  1.17 1.3 (E)  Calcium 8.7 - 10.7  9.8 9.3 (E)  BUN/Creatinine Ratio 6 - 22 (calc) 22   Alkaline Phosphatase 25 - 125   112 (E)  Albumin 3.5 - 5.0   4.3 (E)  AG Ratio 1.0 - 2.5 (calc) 1.4   AST 14 - 40  17 29 (E)  ALT 10 - 40 U/L 7 (L) 18 (E)  Total Protein 6.1 - 8.1 g/dL 7.2   Total Bilirubin 0.2 - 1.2 mg/dL 0.5   Bilirubin, Total   0.8 (E)    Latest Reference Range & Units 04/22/22 16:06 07/27/22 00:00  WBC 3.8 - 10.8 Thousand/uL 7.3 7.2 (E)  RBC 3.87 - 5.11  3.90 (L) 3.96 (E)  Hemoglobin 13.5 - 17.5  12.3 (L) 12.7 ! (E)  HCT 41 - 53  35.6 (L) 36 ! (E)  MCV 80.0 - 100.0 fL 91.3   MCH 27.0 - 33.0 pg 31.5   MCHC 32.0 - 36.0 g/dL 34.6   RDW 11.0 - 15.0 % 12.1   Platelets 150 - 400 K/uL 256 212 (E)  MPV 7.5 - 12.5 fL 10.0     Latest Reference Range & Units 12/14/21 13:58 03/11/22 10:39 06/10/22  14:09 07/27/22 13:09  Prostate Specific Ag, Serum 0.0 - 4.0 ng/mL  9.5 (H)    Prostatic Specific Antigen 0.00 - 4.00 ng/mL 7.88 (H)  14.10 (H) 23.44 (H)    ASSESSMENT & PLAN: Overall, patient is doing well at this time and will continue on the zytiga. PSA did rise slightly since last PSA although this could be due to the xtandi which he was previously on and the zytiga has not had long enough to work to see an improvement in the PSA. He reports that he is not symptomatic from his disease at this time and is doing well on his treatment. Discussed with patient to call me or the office if the difficultly of swallowing gets worse.   I did see if patient was interested in meeting with dietician to see if there are other foods he would be interested in trying since he previously had a feeding tube and no longer does. Patient has tried and states he's figured out what foods he can eat at this time.  Patient will follow up with Dr. Bobby Rumpf in 3 weeks on 10/24 for repeat clinical assessment.   Patient is in agreement with the plans discussed.   Drema Halon, PharmD Hematology/Oncology Clinical Pharmacist [facility] [phone]

## 2022-08-01 NOTE — Telephone Encounter (Signed)
I called to see how Connor [name] is tolerating the Zytiga. He states he really cant tell any difference. He is just tired. The roof of his mouth is still a little sore, but no white spots. He continues to take the medication between 7p-8p on an empty stomach. No missed doses. He denies N/V, swelling in lower extremities, muscle/joint achiness, and skin rashes. I confirmed his next appt.  He mentioned he wanted to talk to [facility], & meant to call her earlier today about his PSA level still going up. Connor [name] asked if he could go back on the [place], and states its no cost. I told Connor [name] that I would send message to [facility], and ask her to call him. He was appreciative.

## 2022-08-02 ENCOUNTER — Other Ambulatory Visit (HOSPITAL_COMMUNITY): Payer: Self-pay

## 2022-08-03 ENCOUNTER — Inpatient Hospital Stay (HOSPITAL_BASED_OUTPATIENT_CLINIC_OR_DEPARTMENT_OTHER): Payer: PPO

## 2022-08-03 DIAGNOSIS — C61 Malignant neoplasm of prostate: Secondary | ICD-10-CM

## 2022-08-04 NOTE — Progress Notes (Signed)
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 Patient was previously treated with Gillermina Phy until September 2023 where disease progression was discovered. He comes in today for clinic after a little under 4 weeks of therapy with the zytiga. Patient states he has bene taking the zytiga in addition to the prednisone. 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 Patient does state that he has missed 2 days of the medication.   Current treatment: Xtandi '160mg'$  daily (started 07/08/22) + Lupron  LABS:  Latest Reference Range & Units 04/22/22 16:06 07/27/22 00:00  Sodium 137 - 147  130 (L) 125 ! (E)  Potassium 3.5 - 5.1 mEq/L 4.7 4.6 (E)  Chloride 99 - 108  93 (L) 93 ! (E)  CO2 13 - '22  27 25 '$ ! (E)  Glucose 65 - 99 mg/dL 97 119 (E)  Mean Plasma Glucose mg/dL 103   BUN 4 - 21  26 (H) 25 ! (E)  Creatinine 0.6 - 1.3  1.17 1.3 (E)  Calcium 8.7 - 10.7  9.8 9.3 (E)  BUN/Creatinine Ratio 6 - 22 (calc) 22   Alkaline Phosphatase 25 - 125   112 (E)  Albumin 3.5 - 5.0   4.3 (E)  AG Ratio 1.0 - 2.5 (calc) 1.4   AST 14 - 40  17 29 (E)  ALT 10 - 40 U/L 7 (L) 18 (E)  Total Protein 6.1 - 8.1 g/dL 7.2   Total Bilirubin 0.2 - 1.2 mg/dL 0.5   Bilirubin, Total   0.8 (E)    Latest Reference Range & Units 04/22/22 16:06 07/27/22 00:00  WBC 3.8 - 10.8 Thousand/uL 7.3 7.2 (E)  RBC 3.87 - 5.11  3.90 (L) 3.96 (E)  Hemoglobin 13.5 - 17.5  12.3 (L) 12.7 ! (E)  HCT 41 - 53  35.6 (L) 36 ! (E)  MCV 80.0 - 100.0 fL 91.3   MCH 27.0 - 33.0 pg 31.5   MCHC 32.0 - 36.0 g/dL 34.6   RDW 11.0 - 15.0 % 12.1   Platelets 150 - 400 K/uL 256 212 (E)  MPV 7.5 - 12.5 fL 10.0     Latest  Reference Range & Units 12/14/21 13:58 03/11/22 10:39 06/10/22 14:09 07/27/22 13:09  Prostate Specific Ag, Serum 0.0 - 4.0 ng/mL  9.5 (H)    Prostatic Specific Antigen 0.00 - 4.00 ng/mL 7.88 (H)  14.10 (H) 23.44 (H)    ASSESSMENT & PLAN: Overall, patient is doing well at this time and he comes in due to wanting to discuss the cost of the medication and the rise in the PSA. At this time, patient is paying $123.73 each month for the zytiga and he states that it is quite expensive to try to pay each month. He is currently on wait lists for grants although no grants have opened up for prostate cancer since he started this medication. Patient just had his second month of therapy shipped to him from [place]. I discussed with patient that he should not worry about his PSA at this time as we are still waiting for the zytiga to work as of last time that it was drawn. We did discuss  that we do potentially have other options if the financial burden of the zytiga becomes to large and he can't keep paying for it which can be discussed at his next appointment and in the future. Patient agrees to continue treatment at this time and that he will let me know if the cost burden becomes too high.   Patient will follow up with Dr. Bobby Rumpf on 10/24 for repeat clinical assessment.   Patient is in agreement with the plans discussed.   Drema Halon, PharmD Hematology/Oncology Clinical Pharmacist [facility] [phone]

## 2022-08-12 ENCOUNTER — Telehealth: Payer: Self-pay

## 2022-08-12 NOTE — Telephone Encounter (Addendum)
I was able to speak with pt on 08/19/2022. Pt states he doesn't hear the telephone ring too well, and maybe why he didn't get my calls earlier. He feels like things are the same, no changes. He takes the medicine every night @ 7pm on empty stomach. He admits that he missed 1 dose last week. He denies N/V, constipation, diarrhea, SOB, chest pain, joint & muscle aches, fevers, and lower extremity swelling. Pt reminded to call us if he develops temp of 100.4 or higher, day or night. He verbalized understanding. I also encouraged him to call us if he had any concerns or questions.

## 2022-08-13 ENCOUNTER — Other Ambulatory Visit: Payer: Self-pay | Admitting: Internal Medicine

## 2022-08-15 ENCOUNTER — Other Ambulatory Visit: Payer: Self-pay | Admitting: Internal Medicine

## 2022-08-15 ENCOUNTER — Inpatient Hospital Stay: Payer: PPO

## 2022-08-15 DIAGNOSIS — C61 Malignant neoplasm of prostate: Secondary | ICD-10-CM | POA: Diagnosis not present

## 2022-08-15 LAB — PSA: Prostatic Specific Antigen: 29.43 ng/mL — ABNORMAL HIGH (ref 0.00–4.00)

## 2022-08-15 NOTE — Progress Notes (Signed)
Connor [name]  [address] [place],  [place]  [address] [phone]  Clinic Day:  08/16/2022  Referring physician: Dorothyann Peng, NP  HISTORY OF PRESENT ILLNESS:  The patient is a 76 y.o. 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  Due to recent disease progression, the patient is cleared for Lupron/abiraterone/prednisone with disease management.  He comes in today to reassess his PSA level since the change in therapy.  Overall, he claims to be doing well.  He denies having any bone pain or other systemic symptoms which concern him for overt signs of disease progression.  However, he claims he misses abiraterone pills 1-2 times per week.    PHYSICAL EXAM:  Blood pressure (!) 156/86, pulse 82, temperature 98.1 F (36.7 C), resp. rate 16, height '5\' 11"'$  (1.803 m), weight 201 lb 1.6 oz (91.2 kg), SpO2 97 %. Wt Readings from Last 3 Encounters:  08/16/22 201 lb 1.6 oz (91.2 kg)  07/05/22 202 lb 11.2 oz (91.9 kg)  06/13/22 200 lb 1.9 oz (90.8 kg)   Body mass index is 28.05 kg/m. Performance status (ECOG): 1 Physical Exam Constitutional:      Appearance: Normal appearance. He is not ill-appearing.  HENT:     Mouth/Throat:     Mouth: Mucous membranes are moist.     Pharynx: Oropharynx is clear. No oropharyngeal exudate or posterior oropharyngeal erythema.  Cardiovascular:     Rate and Rhythm: Normal rate and regular rhythm.     Heart sounds: No murmur heard.    No friction rub. No gallop.  Pulmonary:     Effort: Pulmonary effort is normal. No respiratory distress.     Breath sounds: Normal breath sounds. No wheezing, rhonchi or rales.  Abdominal:     General: Bowel sounds are normal. There is no distension.     Palpations: Abdomen is soft. There is no mass.     Tenderness: There is no abdominal tenderness.  Musculoskeletal:        General: No swelling.     Right lower leg: No edema.     Left lower leg:  No edema.  Lymphadenopathy:     Cervical: No cervical adenopathy.     Upper Body:     Right upper body: No supraclavicular or axillary adenopathy.     Left upper body: No supraclavicular or axillary adenopathy.     Lower Body: No right inguinal adenopathy. No left inguinal adenopathy.  Skin:    General: Skin is warm.     Coloration: Skin is not jaundiced.     Findings: No lesion or rash.  Neurological:     General: No focal deficit present.     Mental Status: He is alert and oriented to person, place, and time. Mental status is at baseline.  Psychiatric:        Mood and Affect: Mood normal.        Behavior: Behavior normal.        Thought Content: Thought content normal.     LABS:    Latest Reference Range & Units 06/10/22 14:09 07/27/22 13:09 08/15/22 13:58  Prostatic Specific Antigen 0.00 - 4.00 ng/mL 14.10 (H) 23.44 (H) 29.43 (H)  (H): Data is abnormally high  ASSESSMENT & PLAN:  Assessment/Plan:  A 76 y.o. 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  Despite being switched to Lupron/abiraterone/prednisone over these past 6 weeks, this gentleman's PSA level has  risen.  In fact, it has doubled in just the past 2 months.  This suggests his metastatic prostate cancer as a more ominous nature to it.  I made sure the patient understood that it will be absolutely imperative for him to take his abiraterone medication on a daily basis due to his disease's aggressive behavior.  I will see this patient back in approximately another 6 weeks to reassess his PSA level to see if it has improved.  If it continues to rise precipitously, a change in therapy would likely need to be considered.  Of note, as this gentleman has had previous osteonecrosis of the jaw, bisphosphonate therapy is contraindicated.  The patient understands all the plans discussed today and is in agreement with them.    Connor Spiewak Macarthur Critchley, MD

## 2022-08-16 ENCOUNTER — Other Ambulatory Visit: Payer: Self-pay | Admitting: Oncology

## 2022-08-16 ENCOUNTER — Other Ambulatory Visit: Payer: Self-pay | Admitting: Internal Medicine

## 2022-08-16 ENCOUNTER — Inpatient Hospital Stay (INDEPENDENT_AMBULATORY_CARE_PROVIDER_SITE_OTHER): Payer: PPO | Admitting: Oncology

## 2022-08-16 VITALS — BP 156/86 | HR 82 | Temp 98.1°F | Resp 16 | Ht 71.0 in | Wt 201.1 lb

## 2022-08-16 DIAGNOSIS — C61 Malignant neoplasm of prostate: Secondary | ICD-10-CM

## 2022-08-19 ENCOUNTER — Encounter: Payer: Self-pay | Admitting: Oncology

## 2022-08-23 ENCOUNTER — Other Ambulatory Visit (HOSPITAL_COMMUNITY): Payer: Self-pay

## 2022-08-24 ENCOUNTER — Other Ambulatory Visit (HOSPITAL_COMMUNITY): Payer: Self-pay

## 2022-08-30 ENCOUNTER — Other Ambulatory Visit (HOSPITAL_COMMUNITY): Payer: Self-pay

## 2022-08-31 ENCOUNTER — Telehealth: Payer: Self-pay | Admitting: Pharmacy Technician

## 2022-08-31 ENCOUNTER — Other Ambulatory Visit (HOSPITAL_COMMUNITY): Payer: Self-pay

## 2022-08-31 ENCOUNTER — Encounter: Payer: Self-pay | Admitting: Oncology

## 2022-08-31 NOTE — Telephone Encounter (Signed)
Called patient and advised. Info provided to the pharmacy.

## 2022-08-31 NOTE — Telephone Encounter (Signed)
Patient has been approved for $3,500 Prostate copay grant through [employer].

## 2022-09-03 ENCOUNTER — Other Ambulatory Visit: Payer: Self-pay | Admitting: Internal Medicine

## 2022-09-05 ENCOUNTER — Other Ambulatory Visit: Payer: Self-pay | Admitting: Internal Medicine

## 2022-09-09 ENCOUNTER — Other Ambulatory Visit: Payer: Self-pay | Admitting: Internal Medicine

## 2022-09-09 ENCOUNTER — Telehealth: Payer: Self-pay

## 2022-09-09 NOTE — Telephone Encounter (Signed)
I called pt to see how he is doing. "I don't know how much longer I can take this medicine. I don't have any strength. It has just dragged me down. I can't even open up a bottle." He is taking 4 tablets @ 7p on empty stomach. No missed doses. He denies N/V, fever, S/S UTI, skin rashes/itching, and constipation/diarrhea. I told pt I would contact you and see if we could possibly reduce the dosing. He was appreciative. Above message sent to Dr Bobby Rumpf and Seabron Spates.

## 2022-09-13 ENCOUNTER — Encounter: Payer: Self-pay | Admitting: Oncology

## 2022-09-20 ENCOUNTER — Other Ambulatory Visit (HOSPITAL_COMMUNITY): Payer: Self-pay

## 2022-09-26 ENCOUNTER — Inpatient Hospital Stay: Payer: PPO | Attending: Oncology

## 2022-09-26 DIAGNOSIS — C7951 Secondary malignant neoplasm of bone: Secondary | ICD-10-CM | POA: Insufficient documentation

## 2022-09-26 DIAGNOSIS — C61 Malignant neoplasm of prostate: Secondary | ICD-10-CM | POA: Insufficient documentation

## 2022-09-26 LAB — PSA: Prostatic Specific Antigen: 41.29 ng/mL — ABNORMAL HIGH (ref 0.00–4.00)

## 2022-09-27 ENCOUNTER — Other Ambulatory Visit: Payer: Self-pay | Admitting: Oncology

## 2022-09-27 ENCOUNTER — Other Ambulatory Visit (HOSPITAL_COMMUNITY): Payer: Self-pay

## 2022-09-27 ENCOUNTER — Inpatient Hospital Stay (INDEPENDENT_AMBULATORY_CARE_PROVIDER_SITE_OTHER): Payer: PPO | Admitting: Oncology

## 2022-09-27 ENCOUNTER — Encounter: Payer: Self-pay | Admitting: Oncology

## 2022-09-27 VITALS — BP 158/85 | HR 79 | Temp 98.1°F | Resp 18 | Ht 71.0 in | Wt 202.7 lb

## 2022-09-27 DIAGNOSIS — C61 Malignant neoplasm of prostate: Secondary | ICD-10-CM

## 2022-09-27 NOTE — Progress Notes (Signed)
[facility]  [address] [place],  [place]  [address] [phone]  Clinic Day:  09/27/2022  Referring physician: Dorothyann Peng, NP  HISTORY OF PRESENT ILLNESS:  The patient is a 76 y.o. 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  Due to recent disease progression, the patient is  taking Lupron/abiraterone/prednisone with disease management.  He comes in today to reassess his PSA level since the change in therapy.  Overall, he claims to be doing well.  He denies having any bone pain or other systemic symptoms which concern him for overt signs of disease progression.  However, he claims he misses abiraterone pills 1-2 times per week.    Since this gentleman was diagnosed with metastatic prostate cancer, he has been on multiple lines of hormonal therapy.  This includes single agent Lupron, which was then switched to Lupron/Casodex upon PSA progression.  As his disease and scans show progression afterwards, he was then switched to Lupron/enzalutamide.  In September 2023, the patient was switched to Lupron/abiraterone/prednisone due to a PSMA PET scan and PSA level showing disease progression.  He remains on this regimen currently.  PHYSICAL EXAM:  Blood pressure (!) 158/85, pulse 79, temperature 98.1 F (36.7 C), temperature source Oral, resp. rate 18, height '5\' 11"'$  (1.803 m), weight 202 lb 11.2 oz (91.9 kg), SpO2 99 %. Wt Readings from Last 3 Encounters:  09/27/22 202 lb 11.2 oz (91.9 kg)  08/16/22 201 lb 1.6 oz (91.2 kg)  07/05/22 202 lb 11.2 oz (91.9 kg)   Body mass index is 28.27 kg/m. Performance status (ECOG): 1 Physical Exam Constitutional:      Appearance: Normal appearance. He is not ill-appearing.  HENT:     Mouth/Throat:     Mouth: Mucous membranes are moist.     Pharynx: Oropharynx is clear. No oropharyngeal exudate or posterior oropharyngeal erythema.  Cardiovascular:     Rate and Rhythm: Normal  rate and regular rhythm.     Heart sounds: No murmur heard.    No friction rub. No gallop.  Pulmonary:     Effort: Pulmonary effort is normal. No respiratory distress.     Breath sounds: Normal breath sounds. No wheezing, rhonchi or rales.  Abdominal:     General: Bowel sounds are normal. There is no distension.     Palpations: Abdomen is soft. There is no mass.     Tenderness: There is no abdominal tenderness.  Musculoskeletal:        General: No swelling.     Right lower leg: No edema.     Left lower leg: No edema.  Lymphadenopathy:     Cervical: No cervical adenopathy.     Upper Body:     Right upper body: No supraclavicular or axillary adenopathy.     Left upper body: No supraclavicular or axillary adenopathy.     Lower Body: No right inguinal adenopathy. No left inguinal adenopathy.  Skin:    General: Skin is warm.     Coloration: Skin is not jaundiced.     Findings: No lesion or rash.  Neurological:     General: No focal deficit present.     Mental Status: He is alert and oriented to person, place, and time. Mental status is at baseline.  Psychiatric:        Mood and Affect: Mood normal.        Behavior: Behavior normal.        Thought Content:  Thought content normal.    LABS:    Latest Reference Range & Units 07/27/22 13:09 08/15/22 13:58 09/26/22 13:41  Prostatic Specific Antigen 0.00 - 4.00 ng/mL 23.44 (H) 29.43 (H) 41.29 (H)  (H): Data is abnormally high  ASSESSMENT & PLAN:  Assessment/Plan:  A 76 y.o. 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  Despite being switched to Lupron/abiraterone/prednisone over these past 3 months, this gentleman's PSA level continues to rise.  I am concerned with how compliant he may be with his abiraterone therapy.  When talking to him, I am concerned he may be missing more doses of abiraterone than what he is letting on.  The patient understands that if his PSA continues to rise, another line of  therapy may need to be considered in the immediate future.  For now, the plan will be to continue him on Lupron/abiraterone/prednisone.  I will see him back in 6 weeks to reassess his PSA level.  If it is even higher at that time, I would likely consider darolutamide therapy in conjunction with his Lupron injections.  Alpharadin may also be considered as an adjunct as previous scans showed bone-only metastatic disease.  The patient understands all the plans discussed today and is in agreement with them.    Vinisha Faxon Macarthur Critchley, MD

## 2022-09-28 ENCOUNTER — Inpatient Hospital Stay: Payer: PPO

## 2022-09-28 VITALS — BP 177/84 | HR 74 | Temp 97.8°F | Resp 18 | Ht 71.0 in | Wt 202.0 lb

## 2022-09-28 DIAGNOSIS — C61 Malignant neoplasm of prostate: Secondary | ICD-10-CM | POA: Diagnosis not present

## 2022-09-28 MED ORDER — LEUPROLIDE ACETATE (3 MONTH) 22.5 MG IM KIT
22.5000 mg | PACK | Freq: Once | INTRAMUSCULAR | Status: AC
  Administered 2022-09-28: 22.5 mg via INTRAMUSCULAR
  Filled 2022-09-28: qty 22.5

## 2022-09-28 NOTE — Patient Instructions (Signed)
Leuprolide Solution for Injection What is this medication? LEUPROLIDE (loo PROE lide) reduces the symptoms of prostate cancer. It works by decreasing levels of the hormone testosterone in the body. This prevents prostate cancer cells from spreading or growing. This medicine may be used for other purposes; ask your health care provider or pharmacist if you have questions. COMMON BRAND NAME(S): Lupron What should I tell my care team before I take this medication? They need to know if you have any of these conditions: Diabetes Heart attack Heart disease High blood pressure High cholesterol Pain or difficulty passing urine Spinal cord metastasis Stroke Tobacco use An unusual or allergic reaction to leuprolide, other medications, foods, dyes, or preservatives Pregnant or trying to get pregnant Breast-feeding How should I use this medication? This medication is for injection under the skin or into a muscle. You will be taught how to prepare and give this medication. Use exactly as directed. Take your medication at regular intervals. Do not take it more often than directed. It is important that you put your used needles and syringes in a special sharps container. Do not put them in a trash can. If you do not have a sharps container, call your care team to get one. A special MedGuide will be given to you by the pharmacist with each prescription and refill. Be sure to read this information carefully each time. Talk to your care team about the use of this medication in children. While this medication may be prescribed for children as young as 8 years for selected conditions, precautions do apply. Overdosage: If you think you have taken too much of this medicine contact a poison control center or emergency room at once. NOTE: This medicine is only for you. Do not share this medicine with others. What if I miss a dose? If you miss a dose, take it as soon as you can. If it is almost time for your next  dose, take only that dose. Do not take double or extra doses. What may interact with this medication? Do not take this medication with any of the following: Chasteberry Cisapride Dronedarone Pimozide Thioridazine This medication may also interact with the following: Estrogen or progestin hormones Herbal or dietary supplements, like black cohosh or DHEA Other medications that cause heart rhythm changes Testosterone This list may not describe all possible interactions. Give your health care provider a list of all the medicines, herbs, non-prescription drugs, or dietary supplements you use. Also tell them if you smoke, drink alcohol, or use illegal drugs. Some items may interact with your medicine. What should I watch for while using this medication? Visit your care team for regular checks on your progress. During the first week, your symptoms may get worse, but then will improve as you continue your treatment. You may get hot flashes, increased bone pain, increased difficulty passing urine, or an aggravation of nerve symptoms. Discuss these effects with your care team, some of them may improve with continued use of this medication. Patients may experience a menstrual cycle or spotting during the first 2 months of therapy with this medication. If this continues, contact your care team. This medication may increase blood sugar. The risk may be higher in patients who already have diabetes. Ask your care team what you can do to lower your risk of diabetes while taking this medication. What side effects may I notice from receiving this medication? Side effects that you should report to your care team as soon as possible: Allergic reactions--skin rash,  itching, hives, swelling of the face, lips, tongue, or throat Heart attack--pain or tightness in the chest, shoulders, arms, or jaw, nausea, shortness of breath, cold or clammy skin, feeling faint or lightheaded Heart rhythm changes--fast or irregular  heartbeat, dizziness, feeling faint or lightheaded, chest pain, trouble breathing High blood sugar (hyperglycemia)--increased thirst or amount of urine, unusual weakness or fatigue, blurry vision Mood swings, irritability, hostility Seizures Stroke--sudden numbness or weakness of the face, arm, or leg, trouble speaking, confusion, trouble walking, loss of balance or coordination, dizziness, severe headache, change in vision Thoughts of suicide or self-harm, worsening mood, feelings of depression Side effects that usually do not require medical attention (report to your care team if they continue or are bothersome): Bone pain Change in sex drive or performance General discomfort and fatigue Hot flashes Muscle pain Pain, redness, or irritation at injection site Swelling of the ankles, hands, or feet This list may not describe all possible side effects. Call your doctor for medical advice about side effects. You may report side effects to FDA at 1-800-FDA-1088. Where should I keep my medication? Keep out of the reach of children and pets. Store below 25 degrees C (77 degrees F). Do not freeze. Protect from light. Get rid of any unused medication after the expiration date. To get rid of medications that are no longer needed or have expired: Take the medication to a medication take-back program. Check with your pharmacy or law enforcement to find a location. If you cannot return the medication, ask your pharmacist or care team how to get rid of this medication safely. NOTE: This sheet is a summary. It may not cover all possible information. If you have questions about this medicine, talk to your doctor, pharmacist, or health care provider.  2023 Elsevier/Gold Standard (2021-09-10 00:00:00)

## 2022-09-29 ENCOUNTER — Other Ambulatory Visit (HOSPITAL_COMMUNITY): Payer: Self-pay

## 2022-10-03 ENCOUNTER — Other Ambulatory Visit (HOSPITAL_COMMUNITY): Payer: Self-pay

## 2022-10-08 ENCOUNTER — Other Ambulatory Visit: Payer: Self-pay | Admitting: Internal Medicine

## 2022-10-13 ENCOUNTER — Other Ambulatory Visit (HOSPITAL_COMMUNITY): Payer: Self-pay

## 2022-10-20 ENCOUNTER — Other Ambulatory Visit (HOSPITAL_COMMUNITY): Payer: Self-pay

## 2022-10-26 ENCOUNTER — Other Ambulatory Visit (HOSPITAL_COMMUNITY): Payer: Self-pay

## 2022-10-26 ENCOUNTER — Other Ambulatory Visit: Payer: Self-pay | Admitting: Internal Medicine

## 2022-10-28 ENCOUNTER — Telehealth: Payer: Self-pay

## 2022-10-28 ENCOUNTER — Other Ambulatory Visit (HOSPITAL_COMMUNITY): Payer: Self-pay

## 2022-10-28 NOTE — Telephone Encounter (Addendum)
I spoke with Izora Gala. She is on the way to [facility] now. She will look at his medication bottles to make sure he has the Abiraterone. She will also have him to call the pharmacy for refill. I told her the pharmacy has been concerned as they have been unable to reach him. The last dispense was Nov 2023.

## 2022-10-31 ENCOUNTER — Telehealth: Payer: Self-pay

## 2022-10-31 NOTE — Telephone Encounter (Signed)
Patient called to advise that he has stopped taking his Zytiga.  It is just not working for him.

## 2022-11-04 ENCOUNTER — Other Ambulatory Visit (HOSPITAL_COMMUNITY): Payer: Self-pay

## 2022-11-07 ENCOUNTER — Inpatient Hospital Stay: Payer: PPO | Attending: Oncology

## 2022-11-07 DIAGNOSIS — C61 Malignant neoplasm of prostate: Secondary | ICD-10-CM | POA: Diagnosis present

## 2022-11-07 LAB — CBC WITH DIFFERENTIAL (CANCER CENTER ONLY)
Abs Immature Granulocytes: 0.03 10*3/uL (ref 0.00–0.07)
Basophils Absolute: 0 10*3/uL (ref 0.0–0.1)
Basophils Relative: 1 %
Eosinophils Absolute: 0.2 10*3/uL (ref 0.0–0.5)
Eosinophils Relative: 3 %
HCT: 35.5 % — ABNORMAL LOW (ref 39.0–52.0)
Hemoglobin: 12 g/dL — ABNORMAL LOW (ref 13.0–17.0)
Immature Granulocytes: 1 %
Lymphocytes Relative: 18 %
Lymphs Abs: 1.1 10*3/uL (ref 0.7–4.0)
MCH: 31.3 pg (ref 26.0–34.0)
MCHC: 33.8 g/dL (ref 30.0–36.0)
MCV: 92.7 fL (ref 80.0–100.0)
Monocytes Absolute: 1 10*3/uL (ref 0.1–1.0)
Monocytes Relative: 15 %
Neutro Abs: 3.8 10*3/uL (ref 1.7–7.7)
Neutrophils Relative %: 62 %
Platelet Count: 267 10*3/uL (ref 150–400)
RBC: 3.83 MIL/uL — ABNORMAL LOW (ref 4.22–5.81)
RDW: 12.3 % (ref 11.5–15.5)
WBC Count: 6.2 10*3/uL (ref 4.0–10.5)
nRBC: 0 % (ref 0.0–0.2)

## 2022-11-07 LAB — PSA: Prostatic Specific Antigen: 59.22 ng/mL — ABNORMAL HIGH (ref 0.00–4.00)

## 2022-11-08 ENCOUNTER — Encounter: Payer: Self-pay | Admitting: Oncology

## 2022-11-08 ENCOUNTER — Inpatient Hospital Stay (INDEPENDENT_AMBULATORY_CARE_PROVIDER_SITE_OTHER): Payer: PPO | Admitting: Oncology

## 2022-11-08 ENCOUNTER — Other Ambulatory Visit (HOSPITAL_COMMUNITY): Payer: Self-pay

## 2022-11-08 ENCOUNTER — Telehealth: Payer: Self-pay | Admitting: Pharmacy Technician

## 2022-11-08 ENCOUNTER — Other Ambulatory Visit: Payer: Self-pay | Admitting: Oncology

## 2022-11-08 VITALS — BP 201/95 | HR 85 | Temp 97.8°F | Resp 16 | Ht 71.0 in | Wt 202.3 lb

## 2022-11-08 DIAGNOSIS — C61 Malignant neoplasm of prostate: Secondary | ICD-10-CM | POA: Diagnosis not present

## 2022-11-08 MED ORDER — APALUTAMIDE 60 MG PO TABS
240.0000 mg | ORAL_TABLET | Freq: Every day | ORAL | 5 refills | Status: DC
  Filled 2022-11-08 – 2022-11-09 (×2): qty 120, 30d supply, fill #0
  Filled 2022-12-12 (×2): qty 120, 30d supply, fill #1

## 2022-11-08 NOTE — Telephone Encounter (Signed)
Oral Oncology Patient Advocate Encounter  After completing a benefits investigation, prior authorization for Connor [name] is not required at this time through [employer] D.  Patient's copay is $3,280.70.     Lady Deutscher, CPhT-Adv Oncology Pharmacy Patient [facility] Direct Number: [phone]  Fax: [phone]

## 2022-11-08 NOTE — Progress Notes (Signed)
[place] [facility]  [address] [place],  [place]  [address] [phone]  Clinic Day:  11/08/2022   Referring physician: Dorothyann Peng, NP  HISTORY OF PRESENT ILLNESS:  The patient is a 77 y.o. 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  Due to recent disease progression, the patient was switched to  Lupron/abiraterone/prednisone for his disease management.  However, the patient comes in today claiming that he has discontinued his abiraterone therapy due to it making him feel very weak.  In passing, he also mentioned having swallowing issues.  As things stand currently, the patient does not wish to get back on abiraterone.  However, he is willing to consider other treatment options for his palliative disease management.  Since this gentleman was diagnosed with metastatic prostate cancer, he has been on multiple lines of hormonal therapy.  This includes single agent Lupron, which was then switched to Lupron/Casodex upon PSA progression.  As his disease and scans show progression afterwards, he was then switched to Lupron/enzalutamide.  In September 2023, the patient was switched to Lupron/abiraterone/prednisone due to a PSMA PET scan and PSA level showing disease progression.    PHYSICAL EXAM:  Blood pressure (!) 201/95, pulse 85, temperature 97.8 F (36.6 C), resp. rate 16, height '5\' 11"'$  (1.803 m), weight 202 lb 4.8 oz (91.8 kg), SpO2 96 %. Wt Readings from Last 3 Encounters:  11/08/22 202 lb 4.8 oz (91.8 kg)  09/28/22 202 lb 0.6 oz (91.6 kg)  09/27/22 202 lb 11.2 oz (91.9 kg)   Body mass index is 28.22 kg/m. Performance status (ECOG): 1 Physical Exam Constitutional:      Appearance: Normal appearance. He is not ill-appearing.  HENT:     Mouth/Throat:     Mouth: Mucous membranes are moist.     Pharynx: Oropharynx is clear. No oropharyngeal exudate or posterior oropharyngeal erythema.  Cardiovascular:     Rate  and Rhythm: Normal rate and regular rhythm.     Heart sounds: No murmur heard.    No friction rub. No gallop.  Pulmonary:     Effort: Pulmonary effort is normal. No respiratory distress.     Breath sounds: Normal breath sounds. No wheezing, rhonchi or rales.  Abdominal:     General: Bowel sounds are normal. There is no distension.     Palpations: Abdomen is soft. There is no mass.     Tenderness: There is no abdominal tenderness.  Musculoskeletal:        General: No swelling.     Right lower leg: No edema.     Left lower leg: No edema.  Lymphadenopathy:     Cervical: No cervical adenopathy.     Upper Body:     Right upper body: No supraclavicular or axillary adenopathy.     Left upper body: No supraclavicular or axillary adenopathy.     Lower Body: No right inguinal adenopathy. No left inguinal adenopathy.  Skin:    General: Skin is warm.     Coloration: Skin is not jaundiced.     Findings: No lesion or rash.  Neurological:     General: No focal deficit present.     Mental Status: He is alert and oriented to person, place, and time. Mental status is at baseline.  Psychiatric:        Mood and Affect: Mood normal.        Behavior: Behavior normal.        Thought Content:  Thought content normal.    LABS:    Latest Reference Range & Units 08/15/22 13:58 09/26/22 13:41 11/07/22 13:42  Prostatic Specific Antigen 0.00 - 4.00 ng/mL 29.43 (H) 41.29 (H) 59.22 (H)  (H): Data is abnormally high  ASSESSMENT & PLAN:  Assessment/Plan:  A 77 y.o. 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  As mentioned previously, the patient stopped taking his abiraterone/prednisone over the past 3 weeks and is not interested in restarting this therapy.  In clinic today, I had a very long discussion with the patient as he has a long history of either not taking medicines or want to stop them altogether.  He sees that his PSA is progressively rising.  As things  currently stand, his life expectancy may not extend beyond the next 18 months without being on some form of therapy to keep his disease under control.  I did talk to this patient about other treatment options, including being placed on a different antiandrogen, such as darolutamide or apalutamide.  I also talked to him about palliative chemotherapy with a taxane agent.  I also talked him about potentially considering Pluvicto.  I also discussed considering a comfort care only approach for his disease management.  After mulling over his options, he is willing to try apalutamide, which would be given in conjunction with Lupron shots, for complete androgen blockade therapy.  In the past, the patient was worried about this agent potentially causing seizures.  However, the patient admits he has not had a seizure in numerous years since he was placed on lamotrigine, which he continues to take.  Based upon this, the patient will start taking apalutamide 240 mg daily, in conjunction with his Lupron injections, for complete androgen blockade therapy.  Our pharmacist will talk to him tomorrow about side effects he needs to be mindful of as it pertains to apalutamide therapy.  Otherwise, I will see this patient back in 6 weeks for repeat clinical assessment.  I strongly encouraged the patient to notify our office over these next few weeks if he runs into any problems from his complete androgen blockade therapy that require immediate clinical attention.  The patient understands all the plans discussed today and is in agreement with them.    Autie Vasudevan Macarthur Critchley, MD

## 2022-11-09 ENCOUNTER — Inpatient Hospital Stay (HOSPITAL_BASED_OUTPATIENT_CLINIC_OR_DEPARTMENT_OTHER): Payer: PPO

## 2022-11-09 ENCOUNTER — Other Ambulatory Visit: Payer: Self-pay

## 2022-11-09 ENCOUNTER — Other Ambulatory Visit (HOSPITAL_COMMUNITY): Payer: Self-pay

## 2022-11-09 DIAGNOSIS — C61 Malignant neoplasm of prostate: Secondary | ICD-10-CM

## 2022-11-09 NOTE — Progress Notes (Addendum)
Oral Chemotherapy Pharmacist Encounter  I spoke in clinic with patient for overview of: Erleada (apalutamide) for the treatment of prostate cancer, planned duration until disease progression or unacceptable toxicity.   Labs from 11/07/22 and 07/27/22 assessed, no interventions needed. Prescription dose and frequency assessed for appropriateness. Patients renal function on the higher end of normal although Erleada does not need any dose adjustments based on renal function.   Current medication list in Epic reviewed, DDIs with Erleada identified: -amlodipine: erleada may decrease the concentration of amlodipine. Will have patient monitor blood pressure.  - rosuvastatin: erleada may decrease the concentration of rosuvastatin. Patient made aware.   Evaluated chart and no patient barriers to medication adherence noted.   Patient agreement for treatment documented in MD note on 11/08/2022.  Prescription has been e-scribed to the [facility] for benefits analysis and approval.  Counseled pati ent on administration, dosing, side effects, monitoring, drug-food interactions, safe handling, storage, and disposal.  Patient will take Erleada 59m tablets, 4 tablets (2465m by mouth once daily without regard to food.  Erleada start date: 11/11/22  Adverse effects include but are not limited to: rash, peripheral edema, GI upset, hypertension, hot flashes, fatigue, and arthralgias.    Reviewed with patient importance of keeping a medication schedule and plan for any missed doses. No barriers to medication adherence identified.  Medication reconciliation performed and medication/allergy list updated.  Insurance authorization for [place] been obtained. Test claim at the pharmacy revealed copayment $0 for 1st fill of 30 days. This will ship from the [facility] 11/09/22 to deliver to patient's home on 11/10/22.  Patient informed the pharmacy will reach out 5-7  days prior to needing next fill of Erleada to coordinate continued medication acquisition to prevent break in therapy.  All questions answered.  Patient voiced understanding and appreciation.   Medication education handout was given to the patient. Patient knows to call the office with questions or concerns. Oral Chemotherapy Clinic phone number provided to patient.   Patient has follow up visit with Dr. LeBobby Rumpfor labs and MD visit on 12/21/22.   KaDrema HalonPharmD Hematology/Oncology Clinical Pharmacist [id number]/17/2024   11:21 AM

## 2022-11-11 ENCOUNTER — Encounter: Payer: Self-pay | Admitting: Oncology

## 2022-11-18 ENCOUNTER — Telehealth: Payer: Self-pay

## 2022-11-18 NOTE — Telephone Encounter (Signed)
I called pt to see how he was tolerating the Erleada (switched from [place]). Pt is taking the Erleada at night, about 10pm. He states, "I'm doing so-so.It is a little easier to take. It dissolves better. I'm still pretty tired". Pt denies N/V, constipation, diarrhea, skin rashes, fever,and swelling in lower extremities. He continued to have hot flashes from the Lupron injection. We confirmed his next 2 appt's. I reminded him to call us if he develops temp of 100.4 or higher, day or night. He verbalized understanding.

## 2022-11-23 ENCOUNTER — Other Ambulatory Visit (HOSPITAL_COMMUNITY): Payer: Self-pay

## 2022-11-29 ENCOUNTER — Other Ambulatory Visit (HOSPITAL_COMMUNITY): Payer: Self-pay

## 2022-12-01 ENCOUNTER — Other Ambulatory Visit (HOSPITAL_COMMUNITY): Payer: Self-pay

## 2022-12-01 ENCOUNTER — Telehealth: Payer: Self-pay

## 2022-12-01 NOTE — Telephone Encounter (Addendum)
I spoke with Connor [name]. He is taking the Erleada @ 10pm. He missed 1 dose recently. Pt reports that he has been feeling weak and tired at times. "I have gotten up to the bathroom the last couple of nights and I felt weak. I took my blood pressure and it was low both nights. 80/40, 84/49 the next night. He takes 2 blood pressure medications daily. He plans to call his PCP. He admits to arthralgias, but has had these for quite some time. He doesn't think it is from [place].  Pt denies N/V, constipation, diarrhea, swelling in lower extremities, and fever. I confirmed next appt's w/pt.  I notified Kaitlyn Schomburg,RPH, & Dr Bobby Rumpf of above.

## 2022-12-05 ENCOUNTER — Other Ambulatory Visit (HOSPITAL_COMMUNITY): Payer: Self-pay

## 2022-12-05 ENCOUNTER — Encounter: Payer: Self-pay | Admitting: Oncology

## 2022-12-07 ENCOUNTER — Other Ambulatory Visit (HOSPITAL_COMMUNITY): Payer: Self-pay

## 2022-12-12 ENCOUNTER — Other Ambulatory Visit (HOSPITAL_COMMUNITY): Payer: Self-pay

## 2022-12-12 ENCOUNTER — Other Ambulatory Visit: Payer: Self-pay

## 2022-12-19 ENCOUNTER — Telehealth: Payer: Self-pay

## 2022-12-19 NOTE — Telephone Encounter (Signed)
I spoke with Mr Guest. He states, "I'm getting by alright. I just feel a little weak sometimes".  Pt completes his ADL's independently, @ his own speed. Pt denies N/V, mouth sores, swelling in feet, chills, & fever. He admits to having "a little touch of diarrhea sometimes, but nothing to worry about". He states he doesn't have any appetite at all. He tries to eat a little bit and he just gets turned off by it. He is drinking boost. He takes his blood pressure at home. He said sometimes it is high, then the next time it is low. He continues to take the Erleada at 10p nightly. He has missed 2 doses. We confirmed his appt here tomorrow for labs, then he sees Dr Bobby Rumpf on Friday. Pt reminded to call us if he develops temp of 100.4 or higher, day or night. He verbalized understanding.

## 2022-12-20 ENCOUNTER — Inpatient Hospital Stay: Payer: PPO | Attending: Oncology

## 2022-12-20 DIAGNOSIS — C61 Malignant neoplasm of prostate: Secondary | ICD-10-CM | POA: Diagnosis present

## 2022-12-20 LAB — CMP (CANCER CENTER ONLY)
ALT: 11 U/L (ref 0–44)
AST: 23 U/L (ref 15–41)
Albumin: 4.1 g/dL (ref 3.5–5.0)
Alkaline Phosphatase: 81 U/L (ref 38–126)
Anion gap: 10 (ref 5–15)
BUN: 27 mg/dL — ABNORMAL HIGH (ref 8–23)
CO2: 27 mmol/L (ref 22–32)
Calcium: 9.2 mg/dL (ref 8.9–10.3)
Chloride: 90 mmol/L — ABNORMAL LOW (ref 98–111)
Creatinine: 1.07 mg/dL (ref 0.61–1.24)
GFR, Estimated: >60 mL/min (ref >60)
Glucose, Bld: 101 mg/dL — ABNORMAL HIGH (ref 70–99)
Potassium: 4.3 mmol/L (ref 3.5–5.1)
Sodium: 127 mmol/L — ABNORMAL LOW (ref 135–145)
Total Bilirubin: 0.4 mg/dL (ref 0.3–1.2)
Total Protein: 7.5 g/dL (ref 6.5–8.1)

## 2022-12-20 LAB — CBC WITH DIFFERENTIAL (CANCER CENTER ONLY)
Abs Immature Granulocytes: 0.01 10*3/uL (ref 0.00–0.07)
Basophils Absolute: 0.1 10*3/uL (ref 0.0–0.1)
Basophils Relative: 1 %
Eosinophils Absolute: 0.3 10*3/uL (ref 0.0–0.5)
Eosinophils Relative: 4 %
HCT: 36.5 % — ABNORMAL LOW (ref 39.0–52.0)
Hemoglobin: 12.5 g/dL — ABNORMAL LOW (ref 13.0–17.0)
Immature Granulocytes: 0 %
Lymphocytes Relative: 20 %
Lymphs Abs: 1.2 10*3/uL (ref 0.7–4.0)
MCH: 31.4 pg (ref 26.0–34.0)
MCHC: 34.2 g/dL (ref 30.0–36.0)
MCV: 91.7 fL (ref 80.0–100.0)
Monocytes Absolute: 1.1 10*3/uL — ABNORMAL HIGH (ref 0.1–1.0)
Monocytes Relative: 17 %
Neutro Abs: 3.5 10*3/uL (ref 1.7–7.7)
Neutrophils Relative %: 58 %
Platelet Count: 251 10*3/uL (ref 150–400)
RBC: 3.98 MIL/uL — ABNORMAL LOW (ref 4.22–5.81)
RDW: 12.1 % (ref 11.5–15.5)
WBC Count: 6.1 10*3/uL (ref 4.0–10.5)
nRBC: 0 % (ref 0.0–0.2)

## 2022-12-20 LAB — PSA: Prostatic Specific Antigen: 116.86 ng/mL — ABNORMAL HIGH (ref 0.00–4.00)

## 2022-12-20 NOTE — Progress Notes (Signed)
[place]  [address] [place],  [place]  [address] [phone]  Clinic Day:  12/21/2022   Referring physician: Dorothyann Peng, NP  HISTORY OF PRESENT ILLNESS:  The patient is a 77 y.o. 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  He comes in today to reassess his disease.  The patient was recently started on Lupron/apalutamide in January 2024 as he had problems tolerating the  Lupron/abiraterone/prednisone regimen.  Despite changing to a new regimen, the patient claims his Lupron/apalutamide leads to him having excessive fatigue, which significantly impacts his daily quality of life.  Although he understands the reasons behind why this therapy is being given, he is becoming more disenchanted with continuing additional therapy for his metastatic prostate cancer due to how poorly all of his previous regimens have made him feel.  He denies having any new symptoms or findings which concern him for overt progression of his metastatic prostate cancer.    Since this gentleman was diagnosed with metastatic prostate cancer, he has been on multiple lines of hormonal therapy.  This includes single agent Lupron, which was then switched to Lupron/Casodex upon PSA progression.  As his disease and scans show progression afterwards, he was then switched to Lupron/enzalutamide.  In September 2023, the patient was switched to Lupron/abiraterone/prednisone due to a PSMA PET scan and PSA level showing disease progression.    PHYSICAL EXAM:  Blood pressure (!) 178/81, pulse 77, temperature 98 F (36.7 C), temperature source Oral, resp. rate 18, height '5\' 11"'$  (1.803 m), weight 198 lb 9.6 oz (90.1 kg), SpO2 98 %. Wt Readings from Last 3 Encounters:  12/21/22 198 lb 9.6 oz (90.1 kg)  11/08/22 202 lb 4.8 oz (91.8 kg)  09/28/22 202 lb 0.6 oz (91.6 kg)   Body mass index is 27.7 kg/m. Performance status (ECOG): 1 Physical  Exam Constitutional:      Appearance: Normal appearance. He is not ill-appearing.  HENT:     Mouth/Throat:     Mouth: Mucous membranes are moist.     Pharynx: Oropharynx is clear. No oropharyngeal exudate or posterior oropharyngeal erythema.  Cardiovascular:     Rate and Rhythm: Normal rate and regular rhythm.     Heart sounds: No murmur heard.    No friction rub. No gallop.  Pulmonary:     Effort: Pulmonary effort is normal. No respiratory distress.     Breath sounds: Normal breath sounds. No wheezing, rhonchi or rales.  Abdominal:     General: Bowel sounds are normal. There is no distension.     Palpations: Abdomen is soft. There is no mass.     Tenderness: There is no abdominal tenderness.  Musculoskeletal:        General: No swelling.     Right lower leg: No edema.     Left lower leg: No edema.  Lymphadenopathy:     Cervical: No cervical adenopathy.     Upper Body:     Right upper body: No supraclavicular or axillary adenopathy.     Left upper body: No supraclavicular or axillary adenopathy.     Lower Body: No right inguinal adenopathy. No left inguinal adenopathy.  Skin:    General: Skin is warm.     Coloration: Skin is not jaundiced.     Findings: No lesion or rash.  Neurological:     General: No focal deficit present.     Mental Status: He is alert and oriented to person,  place, and time. Mental status is at baseline.  Psychiatric:        Mood and Affect: Mood normal.        Behavior: Behavior normal.        Thought Content: Thought content normal.   LABS:    Latest Reference Range & Units 09/26/22 13:41 11/07/22 13:42 12/20/22 14:08  Prostatic Specific Antigen 0.00 - 4.00 ng/mL 41.29 (H) 59.22 (H) 116.86 (H)  (H): Data is abnormally high   ASSESSMENT & PLAN:  Assessment/Plan:  A 77 y.o. 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  Despite taking his Lupron/apalutamide, this gentleman's PSA level has risen.  The patient  claims there were a few days where he did not take his apalutamide therapy.  In clinic today, I had a very long discussion with the patient.  He was very candid in stating that he is not entirely certain if he wishes to continue any form of therapy for the management of his metastatic prostate cancer.  This has been a longstanding issue with this patient, particularly as he has not been enchanted with some of the side effects his previous prostate cancer medicines caused.  I also talked him about switching to chemotherapy.  As things stand currently, he is not convinced about continuing any form of therapy for his metastatic disease.  I just asked the patient to keep up abreast with what he wishes to do moving forward.  I will tentatively see this patient back in 1 month to see if his PSA level continues to rise.  The patient understands all the plans discussed today and is in agreement with them.  Kentavious Michele Macarthur Critchley, MD

## 2022-12-21 ENCOUNTER — Ambulatory Visit: Payer: PPO

## 2022-12-21 ENCOUNTER — Inpatient Hospital Stay (INDEPENDENT_AMBULATORY_CARE_PROVIDER_SITE_OTHER): Payer: PPO | Admitting: Oncology

## 2022-12-21 ENCOUNTER — Telehealth: Payer: Self-pay | Admitting: Oncology

## 2022-12-21 ENCOUNTER — Other Ambulatory Visit: Payer: Self-pay | Admitting: Oncology

## 2022-12-21 ENCOUNTER — Encounter: Payer: Self-pay | Admitting: Oncology

## 2022-12-21 VITALS — BP 178/81 | HR 77 | Temp 98.0°F | Resp 18 | Ht 71.0 in | Wt 198.6 lb

## 2022-12-21 DIAGNOSIS — C61 Malignant neoplasm of prostate: Secondary | ICD-10-CM | POA: Diagnosis not present

## 2022-12-21 MED ORDER — METHYLPHENIDATE HCL 5 MG PO TABS: ORAL_TABLET | ORAL | 0 refills | Status: DC

## 2022-12-21 NOTE — Telephone Encounter (Signed)
12/21/22 Next appt scheduled and confirmed with patient

## 2022-12-27 ENCOUNTER — Ambulatory Visit: Payer: PPO

## 2022-12-28 ENCOUNTER — Encounter: Payer: Self-pay | Admitting: Oncology

## 2023-01-02 ENCOUNTER — Other Ambulatory Visit (HOSPITAL_COMMUNITY): Payer: Self-pay

## 2023-01-06 ENCOUNTER — Other Ambulatory Visit (HOSPITAL_COMMUNITY): Payer: Self-pay

## 2023-01-09 ENCOUNTER — Other Ambulatory Visit (HOSPITAL_COMMUNITY): Payer: Self-pay

## 2023-01-10 ENCOUNTER — Other Ambulatory Visit: Payer: Self-pay | Admitting: Internal Medicine

## 2023-01-10 ENCOUNTER — Other Ambulatory Visit (HOSPITAL_COMMUNITY): Payer: Self-pay

## 2023-01-10 ENCOUNTER — Telehealth: Payer: Self-pay

## 2023-01-10 NOTE — Telephone Encounter (Signed)
Connor [name], cPHT : Hi guys! Just wanted to ask if someone can reach out to this patient and have them call either me [phone]) or [facility] [phone]) they are due for a refill of erleada but pharmacy can't get in touch with them. I left a vm this morning as well   [name],RN: I just spoke with pt. He states, "I have decided to NOT take it anymore. Last dose was 12/22/22. The Erleada just make him feel bad and he can't do anything. "Im just going to let things run its course".  I notified Dr Bobby Rumpf, Verline Lema Forrest, and [facility].   Tammy Beal,CMA:  Dr. Bobby Rumpf said earlier he thought this is what he had decided. I LM for him to return my call as well. Thanks   Connor [name], cPHT : okay, sounds good, I will notify the pharmacy to take him out of their system. Thanks guys!

## 2023-01-18 ENCOUNTER — Inpatient Hospital Stay: Payer: PPO | Attending: Oncology

## 2023-01-18 DIAGNOSIS — C61 Malignant neoplasm of prostate: Secondary | ICD-10-CM | POA: Diagnosis present

## 2023-01-18 LAB — CMP (CANCER CENTER ONLY)
ALT: 13 U/L (ref 0–44)
AST: 24 U/L (ref 15–41)
Albumin: 4.5 g/dL (ref 3.5–5.0)
Alkaline Phosphatase: 119 U/L (ref 38–126)
Anion gap: 10 (ref 5–15)
BUN: 25 mg/dL — ABNORMAL HIGH (ref 8–23)
CO2: 28 mmol/L (ref 22–32)
Calcium: 9.3 mg/dL (ref 8.9–10.3)
Chloride: 91 mmol/L — ABNORMAL LOW (ref 98–111)
Creatinine: 0.9 mg/dL (ref 0.61–1.24)
GFR, Estimated: >60 mL/min (ref >60)
Glucose, Bld: 116 mg/dL — ABNORMAL HIGH (ref 70–99)
Potassium: 3.8 mmol/L (ref 3.5–5.1)
Sodium: 129 mmol/L — ABNORMAL LOW (ref 135–145)
Total Bilirubin: 0.6 mg/dL (ref 0.3–1.2)
Total Protein: 7.3 g/dL (ref 6.5–8.1)

## 2023-01-18 LAB — PSA: Prostatic Specific Antigen: 136.94 ng/mL — ABNORMAL HIGH (ref 0.00–4.00)

## 2023-01-18 LAB — CBC WITH DIFFERENTIAL (CANCER CENTER ONLY)
Abs Immature Granulocytes: 0.01 10*3/uL (ref 0.00–0.07)
Basophils Absolute: 0.1 10*3/uL (ref 0.0–0.1)
Basophils Relative: 1 %
Eosinophils Absolute: 0.2 10*3/uL (ref 0.0–0.5)
Eosinophils Relative: 4 %
HCT: 38.3 % — ABNORMAL LOW (ref 39.0–52.0)
Hemoglobin: 13.2 g/dL (ref 13.0–17.0)
Immature Granulocytes: 0 %
Lymphocytes Relative: 19 %
Lymphs Abs: 1.2 10*3/uL (ref 0.7–4.0)
MCH: 31.2 pg (ref 26.0–34.0)
MCHC: 34.5 g/dL (ref 30.0–36.0)
MCV: 90.5 fL (ref 80.0–100.0)
Monocytes Absolute: 0.8 10*3/uL (ref 0.1–1.0)
Monocytes Relative: 14 %
Neutro Abs: 3.9 10*3/uL (ref 1.7–7.7)
Neutrophils Relative %: 62 %
Platelet Count: 263 10*3/uL (ref 150–400)
RBC: 4.23 MIL/uL (ref 4.22–5.81)
RDW: 12.3 % (ref 11.5–15.5)
WBC Count: 6.1 10*3/uL (ref 4.0–10.5)
nRBC: 0 % (ref 0.0–0.2)

## 2023-01-18 NOTE — Progress Notes (Deleted)
[facility]  [address] [place],  [place]  [address] [phone]  Clinic Day:  12/21/2022   Referring physician: Dorothyann Peng, NP  HISTORY OF PRESENT ILLNESS:  The patient is a 77 y.o. 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  He comes in today to reassess his disease.  The patient was recently started on Lupron/apalutamide in January 2024 as he had problems tolerating the  Lupron/abiraterone/prednisone regimen.  Despite changing to a new regimen, the patient claims his Lupron/apalutamide leads to him having excessive fatigue, which significantly impacts his daily quality of life.  Although he understands the reasons behind why this therapy is being given, he is becoming more disenchanted with continuing additional therapy for his metastatic prostate cancer due to how poorly all of his previous regimens have made him feel.  He denies having any new symptoms or findings which concern him for overt progression of his metastatic prostate cancer.    Since this gentleman was diagnosed with metastatic prostate cancer, he has been on multiple lines of hormonal therapy.  This includes single agent Lupron, which was then switched to Lupron/Casodex upon PSA progression.  As his disease and scans show progression afterwards, he was then switched to Lupron/enzalutamide.  In September 2023, the patient was switched to Lupron/abiraterone/prednisone due to a PSMA PET scan and PSA level showing disease progression.    PHYSICAL EXAM:  There were no vitals taken for this visit. Wt Readings from Last 3 Encounters:  12/21/22 198 lb 9.6 oz (90.1 kg)  11/08/22 202 lb 4.8 oz (91.8 kg)  09/28/22 202 lb 0.6 oz (91.6 kg)   There is no height or weight on file to calculate BMI. Performance status (ECOG): 1 Physical Exam Constitutional:      Appearance: Normal appearance. He is not ill-appearing.  HENT:     Mouth/Throat:     Mouth:  Mucous membranes are moist.     Pharynx: Oropharynx is clear. No oropharyngeal exudate or posterior oropharyngeal erythema.  Cardiovascular:     Rate and Rhythm: Normal rate and regular rhythm.     Heart sounds: No murmur heard.    No friction rub. No gallop.  Pulmonary:     Effort: Pulmonary effort is normal. No respiratory distress.     Breath sounds: Normal breath sounds. No wheezing, rhonchi or rales.  Abdominal:     General: Bowel sounds are normal. There is no distension.     Palpations: Abdomen is soft. There is no mass.     Tenderness: There is no abdominal tenderness.  Musculoskeletal:        General: No swelling.     Right lower leg: No edema.     Left lower leg: No edema.  Lymphadenopathy:     Cervical: No cervical adenopathy.     Upper Body:     Right upper body: No supraclavicular or axillary adenopathy.     Left upper body: No supraclavicular or axillary adenopathy.     Lower Body: No right inguinal adenopathy. No left inguinal adenopathy.  Skin:    General: Skin is warm.     Coloration: Skin is not jaundiced.     Findings: No lesion or rash.  Neurological:     General: No focal deficit present.     Mental Status: He is alert and oriented to person, place, and time. Mental status is at baseline.  Psychiatric:        Mood and  Affect: Mood normal.        Behavior: Behavior normal.        Thought Content: Thought content normal.    LABS:    Latest Reference Range & Units 12/20/22 14:08 01/18/23 14:16  Prostatic Specific Antigen 0.00 - 4.00 ng/mL 116.86 (H) 136.94 (H)  (H): Data is abnormally high ASSESSMENT & PLAN:  Assessment/Plan:  A 77 y.o. 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  Despite taking his Lupron/apalutamide, this gentleman's PSA level has risen.  The patient claims there were a few days where he did not take his apalutamide therapy.  In clinic today, I had a very long discussion with the patient.  He was  very candid in stating that he is not entirely certain if he wishes to continue any form of therapy for the management of his metastatic prostate cancer.  This has been a longstanding issue with this patient, particularly as he has not been enchanted with some of the side effects his previous prostate cancer medicines caused.  I also talked him about switching to chemotherapy.  As things stand currently, he is not convinced about continuing any form of therapy for his metastatic disease.  I just asked the patient to keep up abreast with what he wishes to do moving forward.  I will tentatively see this patient back in 1 month to see if his PSA level continues to rise.  The patient understands all the plans discussed today and is in agreement with them.  Natassia Guthridge Macarthur Critchley, MD

## 2023-01-19 ENCOUNTER — Ambulatory Visit: Payer: PPO | Admitting: Oncology

## 2023-01-26 NOTE — Progress Notes (Signed)
[facility]  [address] [place],  [place]  [address] [phone]  Clinic Day:  01/27/2023   Referring physician: Shirline Frees, NP  HISTORY OF PRESENT ILLNESS:  The patient is a 77 y.o. 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  He comes in today to reassess his disease. At his last visit, the patient ultimately elected to stop his hormonal therapy.  The patient had been on Lupron/apalutamide since January 2024 as he had problems tolerating the  Lupron/abiraterone/prednisone regimen.  Despite changing to a new regimen, the patient claims his Lupron/apalutamide led to excessive fatigue, which significantly impacted his daily quality of life.  Since last visit, the patient has been doing fairly well.  Although he is recently away from any form of hormonal therapy for his prostate cancer, he is still somewhat concerned about what his metastatic disease will ultimately do.  Since this gentleman was diagnosed with metastatic prostate cancer, he has been on multiple lines of hormonal therapy.  This included single agent Lupron, which was then switched to Lupron/Casodex upon PSA progression.  As scans showed progression, he was switched to Lupron/enzalutamide.  In September 2023, the patient was switched to Lupron/abiraterone/prednisone due to a PSMA PET scan and PSA level showing disease progression.    PHYSICAL EXAM:  Blood pressure (!) 193/90, pulse 75, temperature 98.1 F (36.7 C), resp. rate 16, height 5\' 11"  (1.803 m), weight 196 lb 14.4 oz (89.3 kg), SpO2 95 %. Wt Readings from Last 3 Encounters:  01/27/23 196 lb 14.4 oz (89.3 kg)  12/21/22 198 lb 9.6 oz (90.1 kg)  11/08/22 202 lb 4.8 oz (91.8 kg)   Body mass index is 27.46 kg/m. Performance status (ECOG): 1 Physical Exam Constitutional:      Appearance: Normal appearance. He is not ill-appearing.  HENT:     Mouth/Throat:     Mouth: Mucous membranes are  moist.     Pharynx: Oropharynx is clear. No oropharyngeal exudate or posterior oropharyngeal erythema.  Cardiovascular:     Rate and Rhythm: Normal rate and regular rhythm.     Heart sounds: No murmur heard.    No friction rub. No gallop.  Pulmonary:     Effort: Pulmonary effort is normal. No respiratory distress.     Breath sounds: Normal breath sounds. No wheezing, rhonchi or rales.  Abdominal:     General: Bowel sounds are normal. There is no distension.     Palpations: Abdomen is soft. There is no mass.     Tenderness: There is no abdominal tenderness.  Musculoskeletal:        General: No swelling.     Right lower leg: No edema.     Left lower leg: No edema.  Lymphadenopathy:     Cervical: No cervical adenopathy.     Upper Body:     Right upper body: No supraclavicular or axillary adenopathy.     Left upper body: No supraclavicular or axillary adenopathy.     Lower Body: No right inguinal adenopathy. No left inguinal adenopathy.  Skin:    General: Skin is warm.     Coloration: Skin is not jaundiced.     Findings: No lesion or rash.  Neurological:     General: No focal deficit present.     Mental Status: He is alert and oriented to person, place, and time. Mental status is at baseline.  Psychiatric:        Mood and Affect:  Mood normal.        Behavior: Behavior normal.        Thought Content: Thought content normal.    LABS:    Latest Reference Range & Units 12/20/22 14:08 01/18/23 14:16  Prostatic Specific Antigen 0.00 - 4.00 ng/mL 116.86 (H) 136.94 (H)  (H): Data is abnormally high  ASSESSMENT & PLAN:  Assessment/Plan:  A 77 y.o. 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  Unsurprisingly, this gentleman's PSA level has risen since he has been off all forms of hormonal therapy for his prostate cancer.  In clinic today, we had a detailed discussion about other potential treatment options to consider for his cancer.  They included  taxane-based chemotherapy or radioactive agents to use for his metastatic prostate cancer.  After mulling over these options, the patient did seem somewhat interested in being evaluated for Pluvicto.  Based upon this, I will have nuclear medicine talk to him about using this radioactive medicine to eradicate his known areas of metastatic prostate cancer as much as possible.  I will continue to follow his PSA over time; if he chooses to undergo Pluvicto, hopefully his PSA will be lower at his next visit.  I will see him back in 2 months for repeat clinical assessment.  The patient understands all the plans discussed today and is in agreement with them.  Aniruddh Ciavarella Kirby Funk, MD

## 2023-01-27 ENCOUNTER — Inpatient Hospital Stay: Payer: PPO | Attending: Oncology | Admitting: Oncology

## 2023-01-27 ENCOUNTER — Other Ambulatory Visit: Payer: Self-pay | Admitting: Oncology

## 2023-01-27 VITALS — BP 193/90 | HR 75 | Temp 98.1°F | Resp 16 | Ht 71.0 in | Wt 196.9 lb

## 2023-01-27 DIAGNOSIS — C61 Malignant neoplasm of prostate: Secondary | ICD-10-CM | POA: Diagnosis not present

## 2023-01-31 ENCOUNTER — Other Ambulatory Visit (HOSPITAL_COMMUNITY): Payer: Self-pay | Admitting: Oncology

## 2023-01-31 DIAGNOSIS — C61 Malignant neoplasm of prostate: Secondary | ICD-10-CM

## 2023-02-01 ENCOUNTER — Encounter: Payer: Self-pay | Admitting: Oncology

## 2023-02-17 ENCOUNTER — Ambulatory Visit (HOSPITAL_COMMUNITY)
Admission: RE | Admit: 2023-02-17 | Discharge: 2023-02-17 | Disposition: A | Discharge status: Home / Self Care | Payer: PPO | Source: Ambulatory Visit | Attending: Oncology | Admitting: Oncology

## 2023-02-17 DIAGNOSIS — C61 Malignant neoplasm of prostate: Secondary | ICD-10-CM | POA: Insufficient documentation

## 2023-02-17 DIAGNOSIS — C7951 Secondary malignant neoplasm of bone: Secondary | ICD-10-CM | POA: Insufficient documentation

## 2023-02-20 ENCOUNTER — Other Ambulatory Visit: Payer: Self-pay | Admitting: Oncology

## 2023-02-20 MED ORDER — HYDROCODONE-ACETAMINOPHEN 10-325 MG PO TABS: 1.0000 | ORAL_TABLET | Freq: Four times a day (QID) | ORAL | 0 refills | Status: DC | PRN

## 2023-02-21 ENCOUNTER — Telehealth: Payer: Self-pay

## 2023-02-21 ENCOUNTER — Other Ambulatory Visit: Payer: Self-pay | Admitting: Oncology

## 2023-02-21 DIAGNOSIS — C61 Malignant neoplasm of prostate: Secondary | ICD-10-CM

## 2023-02-21 NOTE — Telephone Encounter (Signed)
Return call to patient, informed patient that Dr. Melvyn Neth agreed to call in pain med. Patient states that he will talk with Dr. Melvyn Neth about care decisions at his next appt. He is willing to be referred to hospice if Dr. Melvyn Neth feels this is appropriate.

## 2023-02-21 NOTE — Telephone Encounter (Signed)
Patient called requesting liquid po pain meds for spine pain located between shoulder blades. States he was seen at urgent care this past weekend for same pain. Dr. Melvyn Neth aware.

## 2023-02-22 ENCOUNTER — Other Ambulatory Visit: Payer: Self-pay | Admitting: Oncology

## 2023-02-23 ENCOUNTER — Other Ambulatory Visit: Payer: Self-pay | Admitting: Oncology

## 2023-02-24 ENCOUNTER — Other Ambulatory Visit: Payer: Self-pay

## 2023-02-24 ENCOUNTER — Other Ambulatory Visit: Payer: Self-pay | Admitting: Oncology

## 2023-02-24 MED ORDER — HYDROCODONE-ACETAMINOPHEN 10-325 MG/15ML PO SOLN: 15.0000 mL | Freq: Four times a day (QID) | ORAL | 0 refills | Status: DC | PRN

## 2023-03-07 ENCOUNTER — Telehealth: Payer: Self-pay | Admitting: Oncology

## 2023-03-07 ENCOUNTER — Other Ambulatory Visit: Payer: Self-pay | Admitting: Hematology and Oncology

## 2023-03-07 DIAGNOSIS — C61 Malignant neoplasm of prostate: Secondary | ICD-10-CM

## 2023-03-07 NOTE — Telephone Encounter (Signed)
03/07/23 Spoke with patient and scheduled upcoming lab appts

## 2023-03-15 ENCOUNTER — Inpatient Hospital Stay: Payer: PPO | Attending: Oncology

## 2023-03-15 DIAGNOSIS — C61 Malignant neoplasm of prostate: Secondary | ICD-10-CM | POA: Insufficient documentation

## 2023-03-15 LAB — CBC (CANCER CENTER ONLY)
HCT: 37.3 % — ABNORMAL LOW (ref 39.0–52.0)
Hemoglobin: 12.4 g/dL — ABNORMAL LOW (ref 13.0–17.0)
MCH: 29.5 pg (ref 26.0–34.0)
MCHC: 33.2 g/dL (ref 30.0–36.0)
MCV: 88.6 fL (ref 80.0–100.0)
Platelet Count: 313 10*3/uL (ref 150–400)
RBC: 4.21 MIL/uL — ABNORMAL LOW (ref 4.22–5.81)
RDW: 12.3 % (ref 11.5–15.5)
WBC Count: 7.5 10*3/uL (ref 4.0–10.5)
nRBC: 0 % (ref 0.0–0.2)

## 2023-03-15 LAB — BASIC METABOLIC PANEL - CANCER CENTER ONLY
Anion gap: 8 (ref 5–15)
BUN: 27 mg/dL — ABNORMAL HIGH (ref 8–23)
CO2: 26 mmol/L (ref 22–32)
Calcium: 9.3 mg/dL (ref 8.9–10.3)
Chloride: 94 mmol/L — ABNORMAL LOW (ref 98–111)
Creatinine: 1.1 mg/dL (ref 0.61–1.24)
GFR, Estimated: >60 mL/min (ref >60)
Glucose, Bld: 111 mg/dL — ABNORMAL HIGH (ref 70–99)
Potassium: 4.5 mmol/L (ref 3.5–5.1)
Sodium: 128 mmol/L — ABNORMAL LOW (ref 135–145)

## 2023-03-17 LAB — PROSTATE-SPECIFIC AG, SERUM (LABCORP): Prostate Specific Ag, Serum: 213 ng/mL — ABNORMAL HIGH (ref 0.0–4.0)

## 2023-03-22 ENCOUNTER — Other Ambulatory Visit (HOSPITAL_COMMUNITY): Payer: PPO

## 2023-03-28 ENCOUNTER — Inpatient Hospital Stay: Payer: PPO | Attending: Oncology

## 2023-03-28 DIAGNOSIS — C61 Malignant neoplasm of prostate: Secondary | ICD-10-CM | POA: Insufficient documentation

## 2023-03-28 DIAGNOSIS — C7951 Secondary malignant neoplasm of bone: Secondary | ICD-10-CM | POA: Insufficient documentation

## 2023-03-28 LAB — CMP (CANCER CENTER ONLY)
ALT: 19 U/L (ref 0–44)
AST: 28 U/L (ref 15–41)
Albumin: 4 g/dL (ref 3.5–5.0)
Alkaline Phosphatase: 146 U/L — ABNORMAL HIGH (ref 38–126)
Anion gap: 11 (ref 5–15)
BUN: 30 mg/dL — ABNORMAL HIGH (ref 8–23)
CO2: 25 mmol/L (ref 22–32)
Calcium: 9.2 mg/dL (ref 8.9–10.3)
Chloride: 94 mmol/L — ABNORMAL LOW (ref 98–111)
Creatinine: 1.29 mg/dL — ABNORMAL HIGH (ref 0.61–1.24)
GFR, Estimated: 58 mL/min — ABNORMAL LOW (ref >60)
Glucose, Bld: 123 mg/dL — ABNORMAL HIGH (ref 70–99)
Potassium: 4.5 mmol/L (ref 3.5–5.1)
Sodium: 130 mmol/L — ABNORMAL LOW (ref 135–145)
Total Bilirubin: 0.8 mg/dL (ref 0.3–1.2)
Total Protein: 7.8 g/dL (ref 6.5–8.1)

## 2023-03-28 LAB — CBC WITH DIFFERENTIAL (CANCER CENTER ONLY)
Abs Immature Granulocytes: 0.03 10*3/uL (ref 0.00–0.07)
Basophils Absolute: 0 10*3/uL (ref 0.0–0.1)
Basophils Relative: 1 %
Eosinophils Absolute: 0.2 10*3/uL (ref 0.0–0.5)
Eosinophils Relative: 2 %
HCT: 37.5 % — ABNORMAL LOW (ref 39.0–52.0)
Hemoglobin: 12.6 g/dL — ABNORMAL LOW (ref 13.0–17.0)
Immature Granulocytes: 0 %
Lymphocytes Relative: 17 %
Lymphs Abs: 1.3 10*3/uL (ref 0.7–4.0)
MCH: 29.8 pg (ref 26.0–34.0)
MCHC: 33.6 g/dL (ref 30.0–36.0)
MCV: 88.7 fL (ref 80.0–100.0)
Monocytes Absolute: 0.9 10*3/uL (ref 0.1–1.0)
Monocytes Relative: 11 %
Neutro Abs: 5.4 10*3/uL (ref 1.7–7.7)
Neutrophils Relative %: 69 %
Platelet Count: 265 10*3/uL (ref 150–400)
RBC: 4.23 MIL/uL (ref 4.22–5.81)
RDW: 12.3 % (ref 11.5–15.5)
WBC Count: 7.8 10*3/uL (ref 4.0–10.5)
nRBC: 0 % (ref 0.0–0.2)

## 2023-03-28 LAB — PSA: Prostatic Specific Antigen: 379.89 ng/mL — ABNORMAL HIGH (ref 0.00–4.00)

## 2023-03-29 NOTE — Progress Notes (Signed)
[facility]  [address] [place],  [place]  [address] [phone]  Clinic Day:  6/[address]24   Referring physician: Shirline Frees, NP  HISTORY OF PRESENT ILLNESS:  The patient is a 77 y.o. 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  He comes in today to reassess his disease. At his last visit, the patient contemplated using Plucivto for his disease management.  However, he ultimately decided to not pursue this therapy either.  He already had elected to stop his hormonal therapy.  The patient had been on Lupron/apalutamide since January 2024 as he had problems tolerating the Lupron/abiraterone/prednisone regimen.  Despite changing to that regimen, the patient claims his Lupron/apalutamide led to excessive fatigue, which significantly impacted his daily quality of life.  The patient comes in today with the clear belief that he wishes to entertain no further treatment for his disease.  He currently denies having any bone pain of other symptoms from his metastatic prostate cancer that are impacting his daily quality of life.   PHYSICAL EXAM:  Blood pressure 116/69, pulse 76, resp. rate 18, height 5\' 11"  (1.803 m), weight 183 lb (83 kg), SpO2 95 %. Wt Readings from Last 3 Encounters:  03/30/23 183 lb (83 kg)  01/27/23 196 lb 14.4 oz (89.3 kg)  12/21/22 198 lb 9.6 oz (90.1 kg)   Body mass index is 25.52 kg/m. Performance status (ECOG): 1 Physical Exam Constitutional:      Appearance: Normal appearance. He is not ill-appearing.  HENT:     Mouth/Throat:     Mouth: Mucous membranes are moist.     Pharynx: Oropharynx is clear. No oropharyngeal exudate or posterior oropharyngeal erythema.  Cardiovascular:     Rate and Rhythm: Normal rate and regular rhythm.     Heart sounds: No murmur heard.    No friction rub. No gallop.  Pulmonary:     Effort: Pulmonary effort is normal. No respiratory distress.     Breath sounds:  Normal breath sounds. No wheezing, rhonchi or rales.  Abdominal:     General: Bowel sounds are normal. There is no distension.     Palpations: Abdomen is soft. There is no mass.     Tenderness: There is no abdominal tenderness.  Musculoskeletal:        General: No swelling.     Right lower leg: No edema.     Left lower leg: No edema.  Lymphadenopathy:     Cervical: No cervical adenopathy.     Upper Body:     Right upper body: No supraclavicular or axillary adenopathy.     Left upper body: No supraclavicular or axillary adenopathy.     Lower Body: No right inguinal adenopathy. No left inguinal adenopathy.  Skin:    General: Skin is warm.     Coloration: Skin is not jaundiced.     Findings: No lesion or rash.  Neurological:     General: No focal deficit present.     Mental Status: He is alert and oriented to person, place, and time. Mental status is at baseline.  Psychiatric:        Mood and Affect: Mood normal.        Behavior: Behavior normal.        Thought Content: Thought content normal.    LABS:    Latest Reference Range & Units 03/15/23 14:16 03/28/23 14:06  Prostate Specific Ag, Serum 0.0 - 4.0 ng/mL 213.0 (H)  Prostatic Specific Antigen 0.00 - 4.00 ng/mL  379.89 (H)  (H): Data is abnormally high  Latest Reference Range & Units 12/20/22 14:08 01/18/23 14:16  Prostatic Specific Antigen 0.00 - 4.00 ng/mL 116.86 (H) 136.94 (H)  (H): Data is abnormally high  ASSESSMENT & PLAN:  Assessment/Plan:  A 77 y.o. 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  Unsurprisingly, this gentleman's PSA level has risen even further as he has not been on any therapy for multiple months.  As mentioned previously, the patient has ultimately elected not to pursue any further therapy for his metastatic prostate cancer.  Based upon this, I will refer him to hospice so they may focus on maintaining his daily quality of life for as long as possible.   Currently, the patient states he does not need any more pain medication at this time.  However, I encouraged him to notify our office over these next few weeks/months if symptoms worsens to where immediate comfort care intervention is necessary.  No scheduled follow-up visits will be made. The patient understands all the plans discussed today and is in agreement with them.  Camelia Stelzner Kirby Funk, MD

## 2023-03-30 ENCOUNTER — Inpatient Hospital Stay (INDEPENDENT_AMBULATORY_CARE_PROVIDER_SITE_OTHER): Payer: PPO | Admitting: Oncology

## 2023-03-30 VITALS — BP 116/69 | HR 76 | Resp 18 | Ht 71.0 in | Wt 183.0 lb

## 2023-03-30 DIAGNOSIS — C61 Malignant neoplasm of prostate: Secondary | ICD-10-CM | POA: Diagnosis not present

## 2023-04-02 ENCOUNTER — Encounter: Payer: Self-pay | Admitting: Oncology

## 2023-04-26 ENCOUNTER — Inpatient Hospital Stay: Payer: PPO

## 2023-04-28 ENCOUNTER — Inpatient Hospital Stay: Payer: PPO | Admitting: Oncology

## 2023-05-03 ENCOUNTER — Other Ambulatory Visit (HOSPITAL_COMMUNITY): Payer: PPO

## 2023-05-18 NOTE — Progress Notes (Deleted)
No chief complaint on file.   HISTORY OF PRESENT ILLNESS:  05/18/23 ALL:  Connor [name] returns for follow up for seizures. He continues lamotrigine 100mg  in am and 200mg  at bedtime.   He continues close follow up with care team. Continuing Lupron/enzalutamide. On asa and rosuvastatin.   05/16/2022 ALL: Connor [name] is a 77 y.o. male here today for follow up for seizures. He continues lamotrigine 100mg  in am and 200mg  at bedtime. No recent seizures. Last known seizure in 2012. He is tolerating meds well.   He is followed by oncology for metastatic prostate cancer on Lupron/enzalutamide. He is followed closely by PCP. He continues asa and rosuvastatin for stroke prevention. He is seen regularly.   HISTORY (copied from Dr Richrd Humbles previous note)  UPDATE (08/24/20, VRP): Since last visit, doing well. Symptoms are stable. No alleviating or aggravating factors. Tolerating meds. No seizures. 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    UPDATE (03/05/18, VRP): Since last visit, doing well. Tolerating meds. No alleviating or aggravating factors. No seizures.   UPDATE 02/01/17: Since last visit, PSA has gone up and may need more treatment. No seizures. Has noticed fine tremor in bilateral hands x 6-8 months. Paternal grandfather and uncle had parkinsons.    UPDATE 11/17/15: Since last visit, doing well. No seizures. Some mild balance issues across open parking lots, but no major falls or problems. Completed prostate radiation.    UPDATE 10/13/14: Since last visit, now on prostate CA therapy (radiation and hormone). No seizures. No new stroke like events. Overall stable neurologically.    UPDATE 10/11/13: Since last visit, stable. No seizures since Dec 2012. Still with some balance difficulty. Tolerating LTG.   PRIOR HPI (12/17/11, Dr. Sandria Manly): 77 year old right-handed white divorced male with a history of episodes in which his mind would suddenly blankout beginning  in 1984.  It would be an unusual sensation and during the episodes he could not respond. He had an episode while on a plane trip to [facility] 10/1992 with a weird sensation and then a generalized major motor seizure.  The plane landed in [place]  and he was evaluated at the [facility]. CAT scan without dye, EKG, CBC, SMA 6, SMA 12, magnesium level were normal and  he was placed on Dilantin 300 mg per day. A second generalized major motor seizure occurred 04/1993 and  I first saw him 05/31/1993. He was drinking 42 beers per week. Because the seizures were simple partial and complex partial with secondary generalization. It was felt his seizures might not  be related to alcohol. 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 He was placed on Dilantin medication and subsequently switched to Lamictal in 2006. He  tried to taper lamotrigine several times and  had generalized major motor seizures. This occurred in 2009. He had a CAT scan for evaluation and a bone density scan 12/06/2004. The foreign body was removed from the right orbit . He previously had an MRI of his lumbar spine 12/13/2006 showing severe canal stenosis at L4-5 anterior listhesis of 4-5 mm and severe facet hypertrophy and a large synovial cyst emanating from the left facet joint. There were also degenerative changes on the right at L5-S1 and facet hypertrophy at L5-S1. There was  disc bulging at L3-4.  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 He underwent radiation therapy and chemotherapy at [facility]. CT of the neck with contrast 07/12/11 showed no evidence of recurrent  disease. 7/12 he had a nocturnal generalized seizure. He awoke at night having bitten his tongue.  He had been trying to taper lamotrigine. 12/13/10 while on [place]  [place] and drinking beer regularly during the  the day he had a seizure. 08/29/11 he had  another seizure.  In November, he  had persistent left-sided headache occurring on a daily basis. Wednesday 09/28/11, he was eating breakfast with friends in a restaurant and noted the onset of the left sided headache with numbness in the left forehead extending down the left side of his face and then involving his entire body with a  tingling sensation or numbness,worse on the left side of his body than the right. He became weak in both arms and both legs and slumped in his chair.  911 was called and he was seen at [facility] emergency room. CT scan of the brain without contrast was unremarkable and MRI study of the brain without and with contrast which was reviewed by Dr. Lennox Laity was negative for acute stroke. At home he noted falling to his left, double vision, and oscillopsia. He did  have hiccups. 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  CT angiogram showed no evidence of posterior circulation stenosis or occlusion and TEE showed normal EF and no emboli source. In the hospital he had a seizure and Lamictal was increas to 100 mg AM and 200 mg PM.He was improving  but 2/9 after having one beer he noticed dizziness and blurred vision  with vertigo and decreased balance. He was seen in the [facility] ER 12/04/11 MRI showing no evidence of acute stroke. He has slowly improved. He denies palpitations. He has spinning, double vision, hiccups,numbness left face and right body, and balance problems. He denies auras of seizures.   REVIEW OF SYSTEMS: Out of a complete 14 system review of symptoms, the patient complains only of the following symptoms, fatigue and all other reviewed systems are negative.   ALLERGIES: Allergies  Allergen Reactions   Morphine And Codeine Nausea And Vomiting      HOME MEDICATIONS: Outpatient Medications Prior to Visit  Medication Sig Dispense Refill   HYDROcodone-Acetaminophen 10-325 MG/15ML SOLN Take 15 mLs by mouth every 6 (six) hours as needed. 450 mL 0   amLODipine (NORVASC) 5 MG tablet TAKE 1 TABLET(5 MG) BY MOUTH DAILY 30 tablet 6   aspirin EC 81 MG tablet Take 81 mg by mouth daily. Swallow whole.     calcium-vitamin D (OSCAL WITH D) 500-200 MG-UNIT tablet Take 1 tablet by mouth 2 (two) times daily. 1500mg  / 200unit tablet     chlorhexidine (PERIDEX) 0.12 % solution RINSE AND GARGLE 15 ML BY MOUTH OR THROAT TWICE DAILY 473 mL 11   Cyanocobalamin (B-12 PO) Take by mouth.     lamoTRIgine (LAMICTAL) 100 MG tablet TAKE 1 TABLET BY MOUTH DAILY AND 2 TABLETS AT BEDTIME 270 tablet 3   Leuprolide Acetate, 6 Month, (LUPRON DEPOT, 49-MONTH,) 45 MG injection Inject 45 mg into the muscle every 6 (six) months.     methylphenidate (RITALIN) 5 MG tablet One tab PO BID; take 1st pill at 10AM and 2nd pill  at 3PM 60 tablet 0   PREVIDENT 5000 BOOSTER PLUS 1.1 % PSTE Place onto teeth.     promethazine (PHENERGAN) 25 MG tablet Take 25 mg by mouth every 4 (four) hours as needed.     rosuvastatin (CRESTOR) 20 MG tablet TAKE 1 TABLET(20 MG) BY MOUTH DAILY 30 tablet 0   valsartan (DIOVAN) 160 MG tablet TAKE 1 TABLET(160 MG) BY MOUTH TWICE DAILY 90 tablet 3   No facility-administered medications prior to visit.     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 Sandria Manly - HE RETIRED AND PT LAST SAW DR. PENUMALLI   Stroke (HCC) DEC 2013   UNABLE TO SPEAK OR MOVE AND RT SIDE WEAKNESS AND LOSS OF SKIN SENSITIVITY TO HEAT AND COLD ON RT SIDE, DOUBLE VISION. BALANCE PROBLEMS---STATES STILL HAS DOUBLE VISION AND BALANCE PROBLEM AND RT SIDED LOSS OF SKIN SENSITIVITY     PAST SURGICAL HISTORY: Past Surgical History:  Procedure Laterality Date   CORONARY ARTERY BYPASS GRAFT N/A 03/27/2020   Procedure: CORONARY ARTERY BYPASS GRAFTING (CABG) x Three, using left internal mammary artery and right leg greater saphenous vein harvested endoscopically;  Surgeon: Kerin Perna, MD;  Location[facility];  Service: Open Heart Surgery;  Laterality: N/A;   hydrocelectomy  11/2000   left   IR FLUORO GUIDE CV LINE RIGHT  08/31/2017   IR GASTROSTOMY TUBE MOD SED  04/07/2020   IR GASTROSTOMY TUBE REMOVAL  07/20/2020   IR US GUIDE VASC ACCESS RIGHT  08/31/2017   JOINT REPLACEMENT     LEFT TOTAL KNEE ARTHROPLASTY   MOUTH SURGERY  2019   NEPHRECTOMY  1990's   left   NEPHRECTOMY     PROSTATE BIOPSY  05/20/14   Gleason 4+3=7, vol 25 gm   RIGHT/LEFT HEART CATH AND CORONARY ANGIOGRAPHY N/A 03/24/2020   Procedure: RIGHT/LEFT HEART CATH AND CORONARY ANGIOGRAPHY;  Surgeon: Kathleene Hazel, MD;  Location: [facility] INVASIVE CV LAB;  Service: Cardiovascular;  Laterality: N/A;   TEE WITHOUT CARDIOVERSION  10/04/2011   Procedure: TRANSESOPHAGEAL ECHOCARDIOGRAM (TEE);  Surgeon: Donato Schultz, MD;  Location: [facility] ENDOSCOPY;  Service: Cardiovascular;  Laterality: N/A;   TEE WITHOUT CARDIOVERSION N/A 03/27/2020   Procedure: TRANSESOPHAGEAL ECHOCARDIOGRAM (TEE);  Surgeon: Donata Clay, Theron Arista, MD;  Location: [facility] OR;  Service: Open Heart Surgery;  Laterality: N/A;   TOTAL KNEE ARTHROPLASTY  2011   left   TOTAL  KNEE ARTHROPLASTY Right 02/24/2014   Procedure: RIGHT TOTAL KNEE ARTHROPLASTY;  Surgeon: Loanne Drilling, MD;  Location: [facility] ORS;  Service: Orthopedics;  Laterality: Right;  FAMILY HISTORY: Family History  Problem Relation Age of Onset   Diabetes Mother    Heart disease Mother    Heart disease Father      SOCIAL HISTORY: Social History   Socioeconomic History   Marital status: Divorced    Spouse name: Not on file   Number of children: 2   Years of education: 12th   Highest education level: Not on file  Occupational History   Occupation: Heritage manager: OTHER    Comment: [employer]  Tobacco Use   Smoking status: Never   Smokeless tobacco: Never  Vaping Use   Vaping status: Never Used  Substance and Sexual Activity   Alcohol use: Yes    Alcohol/week: 3.0 standard drinks of alcohol    Types: 3 Cans of beer per week    Comment: 3 beers a day    Drug use: No   Sexual activity: Not Currently  Other Topics Concern   Not on file  Social History Narrative   Patient is divorced with 2 children.   Patient is right handed.   Patient has hs education.   Patient drinks 2 cups daily.   Social Determinants of Health   Financial Resource Strain: Low Risk  (04/14/2022)   Overall Financial Resource Strain (CARDIA)    Difficulty of Paying Living Expenses: Not hard at all  Food Insecurity: No Food Insecurity (04/14/2022)   Hunger Vital Sign    Worried About Running Out of Food in the Last Year: Never true    Ran Out of Food in the Last Year: Never true  Transportation Needs: No Transportation Needs (04/14/2022)   PRAPARE - Administrator, Civil Service (Medical): No    Lack of Transportation (Non-Medical): No  Physical Activity: Inactive (04/14/2022)   Exercise Vital Sign    Days of Exercise per Week: 0 days    Minutes of Exercise per Session: 0 min  Stress: No Stress Concern Present (04/14/2022)   [employer] of Occupational Health -  Occupational Stress Questionnaire    Feeling of Stress : Not at all  Social Connections: Socially Isolated (04/14/2022)   Social Connection and Isolation Panel [NHANES]    Frequency of Communication with Friends and Family: More than three times a week    Frequency of Social Gatherings with Friends and Family: More than three times a week    Attends Religious Services: Never    Database administrator or Organizations: No    Attends Banker Meetings: Never    Marital Status: Divorced  Catering manager Violence: Not At Risk (04/14/2022)   Humiliation, Afraid, Rape, and Kick questionnaire    Fear of Current or Ex-Partner: No    Emotionally Abused: No    Physically Abused: No    Sexually Abused: No     PHYSICAL EXAM  There were no vitals filed for this visit.  There is no height or weight on file to calculate BMI.  Generalized: Well developed, in no acute distress  Cardiology: normal rate and rhythm, no murmur auscultated  Respiratory: clear to auscultation bilaterally    Neurological examination  Mentation: Alert oriented to time, place, history taking. Follows all commands speech and language fluent Cranial nerve II-XII: Pupils were equal round reactive to light. Extraocular movements were full, visual field were full on confrontational test. Facial sensation and strength were normal.  Head turning and shoulder shrug  were normal and symmetric. Motor: The motor testing reveals  5 over 5 strength of all 4 extremities. Good symmetric motor tone is noted throughout.  Gait and station: Gait is normal.    DIAGNOSTIC DATA (LABS, IMAGING, TESTING) - I reviewed patient records, labs, notes, testing and imaging myself where available.  Lab Results  Component Value Date   WBC 7.8 03/28/2023   HGB 12.6 (L) 03/28/2023   HCT 37.5 (L) 03/28/2023   MCV 88.7 03/28/2023   PLT 265 03/28/2023      Component Value Date/Time   NA 130 (L) 03/28/2023 1405   NA 125 (A) 07/27/2022  0000   K 4.5 03/28/2023 1405   CL 94 (L) 03/28/2023 1405   CO2 25 03/28/2023 1405   GLUCOSE 123 (H) 03/28/2023 1405   BUN 30 (H) 03/28/2023 1405   BUN 25 (A) 07/27/2022 0000   CREATININE 1.29 (H) 03/28/2023 1405   CREATININE 1.17 04/22/2022 1606   CALCIUM 9.2 03/28/2023 1405   PROT 7.8 03/28/2023 1405   ALBUMIN 4.0 03/28/2023 1405   AST 28 03/28/2023 1405   ALT 19 03/28/2023 1405   ALKPHOS 146 (H) 03/28/2023 1405   BILITOT 0.8 03/28/2023 1405   GFRNONAA 58 (L) 03/28/2023 1405   GFRNONAA 69 09/29/2020 1326   GFRAA 80 09/29/2020 1326   Lab Results  Component Value Date   CHOL 176 04/22/2022   HDL 82 04/22/2022   LDLCALC 71 04/22/2022   TRIG 142 04/22/2022   CHOLHDL 2.1 04/22/2022   Lab Results  Component Value Date   HGBA1C 5.2 04/22/2022   Lab Results  Component Value Date   VITAMINB12 747 03/21/2020   Lab Results  Component Value Date   TSH 1.70 04/22/2022        No data to display               No data to display           ASSESSMENT AND PLAN  77 y.o. year old male  has a past medical history of Arthritis, Cancer (HCC), Chronic kidney disease, GERD without esophagitis (03/21/2020), Head and neck cancer (~ 2009), Headache(784.0), Hyperlipidemia, Hypertension, Kidney carcinoma (HCC), Malignant neoplasm of prostate (HCC), Osteonecrosis of jaw (HCC), Prostate cancer (HCC) (05/20/14), Radiation (2015), Seizures (HCC), and Stroke (HCC) (DEC 2013). here with    No diagnosis found.  Connor [name] is doing well, today. He will continue lamotrigine 100mg  in am and 200mg  at bedtime. Labs reviewed in Epic. Healthy lifestyle habits encouraged. He will follow up with PCP and care team as directed. He will return to see me in 1 year, sooner if needed. He verbalizes understanding and agreement with this plan.   No orders of the defined types were placed in this encounter.    No orders of the defined types were placed in this encounter.    Shawnie Dapper,  MSN, FNP-C 05/18/2023, 9:39 AM  [facility] [address], [address] [place], [place] [address] [phone]

## 2023-05-22 ENCOUNTER — Encounter: Payer: Self-pay | Admitting: Family Medicine

## 2023-05-22 ENCOUNTER — Ambulatory Visit: Payer: PPO | Admitting: Family Medicine

## 2023-05-22 DIAGNOSIS — G40909 Epilepsy, unspecified, not intractable, without status epilepticus: Secondary | ICD-10-CM

## 2023-05-22 DIAGNOSIS — I63113 Cerebral infarction due to embolism of bilateral vertebral arteries: Secondary | ICD-10-CM

## 2023-05-23 ENCOUNTER — Other Ambulatory Visit: Payer: Self-pay

## 2023-06-06 ENCOUNTER — Inpatient Hospital Stay: Payer: PPO

## 2023-06-13 ENCOUNTER — Other Ambulatory Visit (HOSPITAL_COMMUNITY): Payer: PPO

## 2023-07-18 ENCOUNTER — Other Ambulatory Visit: Payer: PPO

## 2023-08-29 ENCOUNTER — Other Ambulatory Visit: Payer: PPO

## 2023-09-24 DEATH — deceased

## 2023-10-13 ENCOUNTER — Other Ambulatory Visit: Payer: PPO

## 2023-11-16 ENCOUNTER — Other Ambulatory Visit (HOSPITAL_COMMUNITY): Payer: Self-pay

## 2024-09-17 ENCOUNTER — Other Ambulatory Visit: Payer: Self-pay
